# Patient Record
Sex: Female | Born: 1935 | Race: White | Hispanic: No | Smoking: Former smoker
Health system: Southern US, Community
[De-identification: ages and names within clinical notes are randomized; demographics above are authoritative.]

## PROBLEM LIST (undated history)

## (undated) DIAGNOSIS — I2510 Atherosclerotic heart disease of native coronary artery without angina pectoris: Secondary | ICD-10-CM

## (undated) DIAGNOSIS — M479 Spondylosis, unspecified: Secondary | ICD-10-CM

## (undated) DIAGNOSIS — E785 Hyperlipidemia, unspecified: Secondary | ICD-10-CM

## (undated) DIAGNOSIS — I5042 Chronic combined systolic (congestive) and diastolic (congestive) heart failure: Secondary | ICD-10-CM

## (undated) DIAGNOSIS — G629 Polyneuropathy, unspecified: Secondary | ICD-10-CM

## (undated) DIAGNOSIS — M47815 Spondylosis without myelopathy or radiculopathy, thoracolumbar region: Secondary | ICD-10-CM

## (undated) DIAGNOSIS — K219 Gastro-esophageal reflux disease without esophagitis: Secondary | ICD-10-CM

## (undated) DIAGNOSIS — I10 Essential (primary) hypertension: Secondary | ICD-10-CM

## (undated) DIAGNOSIS — I4891 Unspecified atrial fibrillation: Secondary | ICD-10-CM

## (undated) HISTORY — DX: Polyneuropathy, unspecified: G62.9

## (undated) HISTORY — PX: BACK SURGERY: SHX140

## (undated) HISTORY — PX: ABDOMINAL HYSTERECTOMY: SHX81

## (undated) HISTORY — DX: Atherosclerotic heart disease of native coronary artery without angina pectoris: I25.10

## (undated) HISTORY — DX: Essential (primary) hypertension: I10

## (undated) HISTORY — PX: CORONARY ANGIOPLASTY: SHX604

## (undated) HISTORY — DX: Hyperlipidemia, unspecified: E78.5

## (undated) HISTORY — PX: TONSILLECTOMY: SUR1361

---

## 1958-10-31 HISTORY — PX: CERVICAL DISC SURGERY: SHX588

## 1997-11-26 ENCOUNTER — Other Ambulatory Visit: Admission: RE | Admit: 1997-11-26 | Discharge: 1997-11-26 | Payer: Self-pay | Admitting: Internal Medicine

## 1998-11-28 ENCOUNTER — Other Ambulatory Visit: Admission: RE | Admit: 1998-11-28 | Discharge: 1998-11-28 | Payer: Self-pay | Admitting: Internal Medicine

## 1999-12-07 ENCOUNTER — Other Ambulatory Visit: Admission: RE | Admit: 1999-12-07 | Discharge: 1999-12-07 | Payer: Self-pay | Admitting: Internal Medicine

## 1999-12-30 ENCOUNTER — Encounter: Admission: RE | Admit: 1999-12-30 | Discharge: 1999-12-30 | Payer: Self-pay | Admitting: Internal Medicine

## 1999-12-30 ENCOUNTER — Encounter: Payer: Self-pay | Admitting: Internal Medicine

## 2000-01-12 ENCOUNTER — Encounter: Payer: Self-pay | Admitting: Internal Medicine

## 2000-01-12 ENCOUNTER — Encounter: Admission: RE | Admit: 2000-01-12 | Discharge: 2000-01-12 | Payer: Self-pay | Admitting: Internal Medicine

## 2001-01-12 ENCOUNTER — Encounter: Admission: RE | Admit: 2001-01-12 | Discharge: 2001-01-12 | Payer: Self-pay | Admitting: Internal Medicine

## 2001-01-12 ENCOUNTER — Encounter: Payer: Self-pay | Admitting: Internal Medicine

## 2002-01-16 ENCOUNTER — Encounter: Admission: RE | Admit: 2002-01-16 | Discharge: 2002-01-16 | Payer: Self-pay | Admitting: Internal Medicine

## 2002-01-16 ENCOUNTER — Encounter: Payer: Self-pay | Admitting: Internal Medicine

## 2002-03-21 ENCOUNTER — Encounter (INDEPENDENT_AMBULATORY_CARE_PROVIDER_SITE_OTHER): Payer: Self-pay | Admitting: *Deleted

## 2002-03-21 ENCOUNTER — Ambulatory Visit (HOSPITAL_COMMUNITY): Admission: RE | Admit: 2002-03-21 | Discharge: 2002-03-21 | Payer: Self-pay | Admitting: Gastroenterology

## 2003-02-14 ENCOUNTER — Encounter: Admission: RE | Admit: 2003-02-14 | Discharge: 2003-02-14 | Payer: Self-pay | Admitting: Internal Medicine

## 2003-03-02 DIAGNOSIS — I2510 Atherosclerotic heart disease of native coronary artery without angina pectoris: Secondary | ICD-10-CM

## 2003-03-02 HISTORY — DX: Atherosclerotic heart disease of native coronary artery without angina pectoris: I25.10

## 2003-04-12 ENCOUNTER — Inpatient Hospital Stay (HOSPITAL_BASED_OUTPATIENT_CLINIC_OR_DEPARTMENT_OTHER): Admission: RE | Admit: 2003-04-12 | Discharge: 2003-04-12 | Payer: Self-pay | Admitting: Cardiology

## 2003-04-12 HISTORY — PX: CARDIAC CATHETERIZATION: SHX172

## 2003-04-18 ENCOUNTER — Inpatient Hospital Stay (HOSPITAL_COMMUNITY)
Admission: RE | Admit: 2003-04-18 | Discharge: 2003-04-23 | Payer: Self-pay | Admitting: Thoracic Surgery (Cardiothoracic Vascular Surgery)

## 2003-04-18 HISTORY — PX: CORONARY ARTERY BYPASS GRAFT: SHX141

## 2003-05-27 ENCOUNTER — Encounter (HOSPITAL_COMMUNITY): Admission: RE | Admit: 2003-05-27 | Discharge: 2003-08-25 | Payer: Self-pay | Admitting: Cardiology

## 2003-09-09 ENCOUNTER — Encounter (HOSPITAL_COMMUNITY): Admission: RE | Admit: 2003-09-09 | Discharge: 2003-12-08 | Payer: Self-pay | Admitting: Cardiology

## 2003-11-08 ENCOUNTER — Ambulatory Visit (HOSPITAL_BASED_OUTPATIENT_CLINIC_OR_DEPARTMENT_OTHER): Admission: RE | Admit: 2003-11-08 | Discharge: 2003-11-08 | Payer: Self-pay | Admitting: Orthopedic Surgery

## 2003-11-08 ENCOUNTER — Ambulatory Visit (HOSPITAL_COMMUNITY): Admission: RE | Admit: 2003-11-08 | Discharge: 2003-11-08 | Payer: Self-pay | Admitting: Orthopedic Surgery

## 2003-12-31 ENCOUNTER — Encounter (HOSPITAL_COMMUNITY): Admission: RE | Admit: 2003-12-31 | Discharge: 2004-03-30 | Payer: Self-pay | Admitting: Cardiology

## 2004-03-06 ENCOUNTER — Encounter: Admission: RE | Admit: 2004-03-06 | Discharge: 2004-03-06 | Payer: Self-pay | Admitting: Internal Medicine

## 2004-04-01 ENCOUNTER — Encounter (HOSPITAL_COMMUNITY): Admission: RE | Admit: 2004-04-01 | Discharge: 2004-06-30 | Payer: Self-pay | Admitting: Cardiology

## 2004-07-01 ENCOUNTER — Encounter (HOSPITAL_COMMUNITY): Admission: RE | Admit: 2004-07-01 | Discharge: 2004-09-29 | Payer: Self-pay | Admitting: Cardiology

## 2004-07-21 ENCOUNTER — Encounter: Admission: RE | Admit: 2004-07-21 | Discharge: 2004-07-21 | Payer: Self-pay | Admitting: Internal Medicine

## 2005-03-30 ENCOUNTER — Encounter: Admission: RE | Admit: 2005-03-30 | Discharge: 2005-03-30 | Payer: Self-pay | Admitting: Internal Medicine

## 2006-03-31 ENCOUNTER — Encounter: Admission: RE | Admit: 2006-03-31 | Discharge: 2006-03-31 | Payer: Self-pay | Admitting: Internal Medicine

## 2007-04-11 ENCOUNTER — Encounter: Admission: RE | Admit: 2007-04-11 | Discharge: 2007-04-11 | Payer: Self-pay | Admitting: Internal Medicine

## 2007-11-28 ENCOUNTER — Encounter: Admission: RE | Admit: 2007-11-28 | Discharge: 2007-11-28 | Payer: Self-pay | Admitting: Internal Medicine

## 2007-11-29 ENCOUNTER — Encounter: Admission: RE | Admit: 2007-11-29 | Discharge: 2007-11-29 | Payer: Self-pay | Admitting: Internal Medicine

## 2010-03-21 ENCOUNTER — Encounter: Payer: Self-pay | Admitting: Internal Medicine

## 2010-07-17 NOTE — Discharge Summary (Signed)
NAMEANNALIZA, ZIA                          ACCOUNT NO.:  [id number]   MEDICAL RECORD NO.:  [id number]                   PATIENT TYPE:  INP   LOCATION:  2010                                 FACILITY:  [facility]   PHYSICIAN:  Salvatore Decent. Dorris Fetch, M.D.         DATE OF BIRTH:  [birth year]   DATE OF ADMISSION:  04/18/2003  DATE OF DISCHARGE:                                 DISCHARGE SUMMARY   DATE OF ANTICIPATED DISCHARGE:  April 23, 2003.   PRIMARY ADMITTING DIAGNOSIS:  Chest pain.   ADDITIONAL/DISCHARGE DIAGNOSES:  1. Severe two vessel coronary artery disease with left main disease.  2. Hypercholesterolemia.  3. History of peripheral neuropathy.  4. History of esophageal spasm.   PROCEDURES PERFORMED.:  1. Coronary artery bypass grafting x 4 (left internal mammary artery to the     LAD, saphenous vein graft to the first diagonal, sequential saphenous     vein graft to the first and second obtuse marginals).  2. Endoscopic vein harvest left thigh.   HISTORY:  The patient is a 77 year old white female with no prior history of  coronary artery disease.  In November 2004 she began a workout program and  started walking on the treadmill.  After about 15 minutes on the treadmill,  however, she experienced chest tightness, which was not associated with  diaphoresis or nausea.  This would resolve with rest and was also associated  with mild shortness of breath.  This continued throughout December and  January and in early February 2005 she sought attention from Dr. Jacky Kindle for  this problem.  She underwent testing, which included a stress Cardiolite,  which showed significant ST depression as well as significant lateral wall  ischemia, which was reversible.  Because of this, she was referred to Dr.  Patty Sermons and subsequently to Dr. Peter Swaziland and ultimately underwent  cardiac catheterization.  This showed left main coronary disease with a 60%  stenosis, a significant 90% proximal  LAD lesion and a proximal occlusion of  her circumflex with a small, non-dominant right coronary artery.  Left  ventricular function was normal.  There was a small amount of mitral  regurgitation noted, but this was felt to be catheter induced.  Because of  her symptoms of exertional angina as well as severe left main disease, she  was referred to Dr. Charlett Lango for consideration of surgical  revascularization.  After examination of the patient and review of her  catheterization films, it was his opinion that she would benefit from  coronary artery bypass surgery at this time.  After a complete discussion of  the risks, benefits and alternatives, the patient agreed to proceed with  surgery.  She was scheduled for an outpatient admission on Thursday,  February 17 to proceed with surgery.   HOSPITAL COURSE:  She was admitted to [facility] on April 18, 2003 and was taken to the operating room where she  underwent CABG x 4, as  described in detail above.  She tolerated the procedure well and was  transferred to the SICU in stable condition.  She was able to be extubated  shortly after surgery.  She was initially maintained on a low dose dopamine  drip.  This was weaned and discontinued by late in the evening following  surgery.  She was hemodynamically stable and doing well on postoperative day  one.  At that time her mediastinal chest tubes were removed.  Her  hemodynamic monitoring devices were also removed and she was able to be  transferred to the floor.  Overall, postoperatively she has done well.  By  postoperative day two her remaining pleural chest tubes were removed.  She  was mobilized with cardiac rehab phase I.  She was also started on a beta  blocker and statin therapy.  Her early postoperative issues have been some  mild nausea and vomiting, which occurred on postoperative day three.  She  was complaining of constipation and was treated with laxative therapy  as  well as minimal use of narcotics.  She was able to have a bowel movement and  her symptoms improved considerably.  Since that time she has been taking  p.o. without difficulty.  Also, she developed a leukocytosis up to 21,000 on  postoperative day four.  On exam she had no signs or symptoms of infection.  She has been afebrile and other vital signs have been stable.  It is felt  that this can be rechecked on postoperative day five and a CBC has been  ordered as well as PA and lateral chest x-rays.  Her other lab values showed  a hemoglobin of 11.0, hematocrit 32, platelets 260, potassium 4.5, BUN 10,  creatinine 1.0.  She has otherwise done well.  She has been somewhat volume  overloaded and is diuresing well with Lasix and potassium.  Again, she has  been ambulating in the halls without difficulty and has been weaned from  supplemental oxygen.  She is maintaining O2 sats of greater than 90% on room  air.  Her surgical incision sites are all healing well.  It is felt that if  she remains stable, her white blood cell count remains stable or trends  downward and she is showing no signs or symptoms of infection on  postoperative day five she will hopefully be ready for discharge home on  April 23, 2003.   DISCHARGE MEDICATIONS:  1. Enteric coated aspirin 325 mg q. day.  2. Toprol XL 25 mg q. day.  3. Lipitor 10 mg q.h.s.  4. Lasix 40 mg q. day times one week.  5. K-Dur 20 mEq q. day times one week.  6. Ultram 50 to 100 mg q. 4 hours p.r.n. for pain.   DISCHARGE INSTRUCTIONS:  She is to refrain from driving, heavy lifting or  strenuous activity.  She may continue daily ambulation and use of her  incentive spirometer.  She will continue a low fat, low sodium diet.  She is  asked to shower daily and clean her incisions with soap and water.   DISCHARGE FOLLOW-UP:  She was asked to make an appointment to see Dr. Swaziland in two weeks and to have a chest x-ray at that visit.  She will  then follow-  up with Dr. Dorris Fetch on Thursday, May 16, 2003 at 2:00 p.m.  She is  asked to bring her chest x-ray to this appointment for him to review.  She  will call our office in the interim if she experiences any problems or has  questions.      Coral Ceo, P.A.                        Salvatore Decent Dorris Fetch, M.D.    GC/MEDQ  D:  04/22/2003  T:  04/22/2003  Job:  [id number]   cc:   Geoffry Paradise, M.D.  [address]  [place]  [place] [address]  Fax: [phone]   Peter M. Swaziland, M.D.  [address]  [place], [place] [address]  Fax: [phone]

## 2010-07-17 NOTE — Op Note (Signed)
Cheryl Zamora, Cheryl Zamora                          ACCOUNT NO.:  [id number]   MEDICAL RECORD NO.:  [id number]                   PATIENT TYPE:  INP   LOCATION:  2312                                 FACILITY:  [facility]   PHYSICIAN:  Salvatore Decent. Dorris Fetch, M.D.         DATE OF BIRTH:  1924-02-01   DATE OF PROCEDURE:  04/18/2003  DATE OF DISCHARGE:                                 OPERATIVE REPORT   PREOPERATIVE DIAGNOSES:  Left main and severe two-vessel coronary disease.   POSTOPERATIVE DIAGNOSES:  Left main and severe two-vessel coronary disease.   OPERATION PERFORMED:  Median sternotomy, extracorporeal circulation,  coronary artery bypass grafting times four (left internal mammary artery to  left anterior descending, saphenous vein graft to first diagonal, sequential  saphenous vein graft to obtuse marginals 1 and 2), endoscopic vein harvest  to left thigh.   SURGEON:  Salvatore Decent. Dorris Fetch, M.D.   ASSISTANTEber Jones A. Eustaquio Boyden.   ANESTHESIA:  General.   FINDINGS:  Vein from the left leg, small but good quality.  Vein from right  leg explored.  Only a very small unusable vein found at the knee.  Vein not  harvested from the right leg.  Coronaries small, poor quality.  OM branches  and LAD intramyocardial.   INDICATIONS FOR PROCEDURE:  Cheryl Zamora is a 77 year old lady who recently  began an exercise program and noted while walking on a treadmill, she would  develop tightness in her chest.  This occurred on a regular basis with  approximately the same amount of exercise each time.  She notified Dr.  Jacky Kindle, who began work-up and subsequently referred her to Dr. Peter Swaziland  who performed cardiac catheterization which revealed left main disease,  tight lesion in her LAD and total occlusion of the left circumflex.  She had  a small nondominant right coronary that did not have significant disease.  Cheryl Zamora was referred for consideration of coronary artery bypass grafting.  The  indications, risks, benefits and alternative procedures were discussed  in detail.  She understood and accepted the risks of bypass surgery and  agreed to proceed.   DESCRIPTION OF PROCEDURE:  Cheryl Zamora was brought to the preop holding area on  April 18, 2003.  Lines were placed to monitor arterial, central venous  and pulmonary arterial pressure.  Intravenous antibiotics were administered.  The patient was taken to the operating room, anesthetized and intubated.  A  Foley catheter was placed.  The chest, abdomen and legs were prepped and  draped in the usual fashion.   An incision was made on the medial aspect of the right leg at the level of  the knee.  An attempt to identify the greater saphenous vein was  unsuccessful.  There was a small vein which appeared superficial.  This was  not suitable for use as a bypass graft.  This incision then was closed in  standard fashion.  Another incision was made on the medial aspect of the  left leg at the level of the knee.  There the greater saphenous vein was  identified.  It then was harvested endoscopically from the left thigh.  It  was a relatively small caliber but good quality.   A median sternotomy was performed and the left internal mammary artery was  harvested using standard technique.  Again, this was small but of good  quality.  The patient was fully heparinized prior to dividing the distal end  of the mammary artery.  There was excellent flow through the cut end of the  vessel.  The pericardium was opened.  The ascending aorta was inspected and  palpated.  There was no palpable atherosclerotic disease.  The aorta was  cannulated via concentric 2-0 Ethibond pledgeted pursestring sutures.  A  dual stage venous cannula was placed via pursestring suture on the right  atrial appendage.  Cardiopulmonary bypass was instituted and the patient was  the patient was cooled to 32 degrees Celsius.  The coronary arteries were  inspected and  anastomotic sites were chosen.  Both OM1 and OM2 were small  intramyocardial vessels.  The LAD also was intramyocardial.  The conduits  were inspected and cut to length. The foam pad was placed in the pericardium  to protect the left phrenic nerve.  A temperature probe was placed in the  myocardial septum and a cardioplegia cannula was placed in the ascending  aorta.   The aorta was crossclamped.  The left ventricle was emptied via the aortic  root vent.  Cardiac arrest then was achieved with a combination of cold  antegrade blood cardioplegia and topical iced saline.  After achieving a  complete diastolic arrest and myocardial septal temperature of 10 degrees  Celsius, the following distal anastomoses were performed.   First a reversed saphenous vein graft was placed end-to-side to the first  diagonal branch of the LAD.  This was a 1.5 mm vessel proximally, 1 mm  vessel distally.  The vein graft was anastomosed end-to-side with a running  7-0 Prolene suture.  This anastomosis and all others were probed proximally  and distally prior to tying the suture.  There was good flow through the  graft relative to the vessel size.  Cardioplegia was administered.  There  was good hemostasis.  Next, a reversed saphenous vein graft was placed  sequentially to obtuse marginals 1 and 2. These were both relatively high  anterolateral branches.  OM1 was essentially equivalent to a ramus  intermedius branch.  Both vessels were intramyocardial.  Both were small and  accepted only a 1 mm probe distally.  A 1.5 mm probe did pass proximally in  OM2.  Both were poor quality targets.  A side-to-side anastomosis was  performed from the vein graft to OM1 and end-to-side OM2.  There was good  flow through the graft.  Cardioplegia was administered.  There was good  hemostasis at the anastomoses.   Next, the left internal mammary artery was brought through a window in the pericardium.  The distal end was  spatulated.  It was anastomosed end-to-side  to the distal LAD which was a 1.5 mm intramyocardial vessel.  It did have  diffuse disease throughout.  The end-to-side anastomosis was performed with  a running 8-0 Prolene suture.  At completion of the mammary to LAD  anastomosis, the bulldog clamp was briefly removed.  Immediate and rapid  septal rewarming was noted.  The bulldog  clamp was replaced.  The mammary  pedicle was tacked to the epicardial surface of the heart with 6-0 Prolene  sutures.   Additional cardioplegia was administered down the vein grafts and the aortic  root.  The cardioplegia cannula then was removed from the ascending aorta  and the vein grafts were cut to length and the proximal vein graft  anastomoses were performed to 4.0 mm punch aortotomies with running 6-0  Prolene sutures.   At the completion of the final proximal anastomosis, the patient was placed  in Trendelenburg position.  The bulldog clamp was again removed from the  mammary artery.  Lidocaine was administered.  The aortic root was deaired.  The aortic crossclamp was removed.  The total crossclamp time was 71  minutes.  All proximal and distal anastomoses were inspected for hemostasis.  Epicardial pacing wires were placed on the right ventricle and right atrium  and when the patient had been rewarmed to a core temperature of 37 degrees  Celsius, she was weaned from cardiopulmonary bypass.  She was atrially paced  for rate and on no inotropic support at time of separation from bypass.  The  index was less than 2 L per minute per meter squared but with volume  resuscitation, her cardiac index improved and she remained hemodynamically  stable throughout the postbypass period.  The test dose of protamine was  administered and was well tolerated.  The atrial and aortic cannulae were  removed.  The remainder of the protamine was administered without incident.  The chest was irrigated with 1 L of warm normal  saline containing 1 g of  vancomycin.  Hemostasis was achieved.  The pericardium was reapproximated  over the ascending aorta and base of the heart with interrupted 3-0 silk  sutures.  It came together easily without tension.  A left pleural and two  mediastinal chest tubes were placed through separate subcostal incisions.  The sternum was closed with heavy gauge interrupted stainless steel wires.  The pectoralis fascia was closed with running #1 Vicryl suture.  The  subcutaneous tissues were closed with running 2-0 Vicryl sutures.  The skin  was closed with 3-0 Vicryl subcuticular suture.  All sponge, needle and  instrument counts were correct at the end of the procedure.  Cheryl Zamora was  taken from the operating room to the surgical intensive care unit in  critical but stable condition.                                               Salvatore Decent Dorris Fetch, M.D.    SCH/MEDQ  D:  04/18/2003  T:  04/19/2003  Job:  [id number]   cc:   Peter M. Swaziland, M.D. [address]  [place], [place] [address]  Fax: [phone]   Geoffry Paradise, M.D.  [address]  [place]  [place] [address]  Fax: [phone]

## 2010-07-17 NOTE — Op Note (Signed)
[name], Cheryl Zamora                          ACCOUNT NO.:  [id number]   MEDICAL RECORD NO.:  [id number]                   PATIENT TYPE:  AMB   LOCATION:  ENDO                                 FACILITY:  [facility]   PHYSICIAN:  Danise Edge, M.D.                DATE OF BIRTH:  11/05/1923   DATE OF PROCEDURE:  DATE OF DISCHARGE:                                 OPERATIVE REPORT   PROCEDURE:  Screening colonoscopy.   ENDOSCOPIST:  Verlin Grills, M.D.   REFERRING PHYSICIAN:  Geoffry Paradise, M.D.   INDICATIONS FOR PROCEDURE:  This patient is a 77 year old female born 1923-10-24.  She is scheduled to undergo her first screening colonoscopy with  polypectomy to prevent colon cancer.   PREMEDICATION:  Versed 4 mg, Demerol 50 mg.   ENDOSCOPE:  Olympus pediatric colonoscope.   DESCRIPTION OF PROCEDURE:  After obtaining the informed consent, the patient  was placed in the left lateral decubitus position.  I administered  intravenous Demerol and intravenous Versed to achieve conscious sedation  before the  procedure.  The patient's blood pressure, oxygen saturation, and cardiac  rhythm were monitored throughout the procedure and documented in the medical  record.   Anal inspection was normal.  Digital rectal exam was normal.  The Olympus  pediatric video colonoscope was introduced into the rectum and easily  advanced to the cecum.  Colonic preparation for the exam today was  excellent.   Rectum normal.   Sigmoid colon and descending colon:  Left colonic diverticulosis.  From the  mid sigmoid colon, a 12 mm sessile polyp was removed with the hot biopsy  forceps.  From the proximal descending colon, a 2 mm sessile polyp was  removed with the hot biopsy forceps.   Splenic flexure normal.   Transverse colon normal.   Hepatic flexure:  A 2 mm sessile polyp was removed with the hot biopsy  forceps.   Ascending colon normal.   Cecum and ileocecal valve normal.   ASSESSMENT:  1. Left colonic diverticulosis.  2. Three small polyps were removed from the colon with the hot biopsy     forceps; a 2 mm polyp was present in the hepatic flexure.  A 2 mm polyp     was present in the descending colon, and a 2 mm polyp was present in the     sigmoid colon.   RECOMMENDATIONS:  If polyps return neoplastic, the patient should undergo a  repeat colonoscopy in 3-5 years.                                               Danise Edge, M.D.    MJ/MEDQ  D:  03/21/2002  T:  03/21/2002  Job:  [id number]   cc:  Geoffry Paradise, M.D.  [address]  [place]  [place] [address]  Fax: [phone]

## 2010-07-17 NOTE — Cardiovascular Report (Signed)
NAMESOL, ODOR                          ACCOUNT NO.:  [id number]   MEDICAL RECORD NO.:  [id number]                   PATIENT TYPE:  OIB   LOCATION:  6501                                 FACILITY:  [facility]   PHYSICIAN:  Peter M. Swaziland, M.D.               DATE OF BIRTH:  [birth year]   DATE OF PROCEDURE:  04/12/2003  DATE OF DISCHARGE:                              CARDIAC CATHETERIZATION   INDICATION FOR PROCEDURE:  This is a 77 year old white female with a history  of hypercholesterolemia who presents with a 57-month history of dyspnea on  exertion.  Stress Cardiolite study was abnormal, showing evidence of lateral  wall ischemia.   PROCEDURE:  Left heart catheterization, coronary and left ventricular  angiography.   ACCESS:  Via the right femoral artery using standard Seldinger technique.   EQUIPMENT:  4-French 4-cm right and left Judkins catheters, 4-French pigtail  catheter, 4-French arterial sheath.   MEDICATIONS:  Local anesthesia 1% Xylocaine, Versed 1 mg IV.   CONTRAST:  120 mL of Omnipaque.   HEMODYNAMIC DATA:  1. Aortic pressure was 147/57 with a mean of 92 mmHg.  2. Left ventricular pressure was 144 with an EDP of 23 mmHg.   ANGIOGRAPHIC DATA:  The left coronary artery arises and distributes  normally.  The left main coronary artery has a 60% stenosis with severe  ulceration.   The left anterior descending artery is a large vessel which has a complex  90% stenosis proximally at the takeoff of the first septal perforator.  The  first diagonal has mild disease up to 30%.  There is also diffuse 30%  narrowing in the mid LAD.   The left circumflex coronary has a 95% stenosis at its takeoff and then is  totally occluded proximally.  It does fill late by left-to-left collaterals.   The right coronary is a small nondominant vessel and has no significant  disease.   Left ventricular angiography demonstrates normal left ventricular size and  contractility with normal  systolic function.  Ejection fraction is estimated  at 60%.  There is no mitral regurgitation or prolapse.  The aortic valve  appears normal.   FINAL INTERPRETATION:  1. Severe 2-vessel and left main obstructive atherosclerotic coronary artery     disease.  2. Normal left ventricular function.   PLAN:  The patient will need to be referred for coronary artery bypass  surgery.                                               Peter M. Swaziland, M.D.    PMJ/MEDQ  D:  04/12/2003  T:  04/12/2003  Job:  [id number]   cc:   Geoffry Paradise, M.D.  [address]  [place]  [place] [address]  Fax: [phone]  Cassell Clement, M.D.  [address]  [place]  [place] [address]  Fax: [phone]   CVTS

## 2011-07-08 ENCOUNTER — Encounter: Payer: Self-pay | Admitting: *Deleted

## 2011-07-08 DIAGNOSIS — G629 Polyneuropathy, unspecified: Secondary | ICD-10-CM | POA: Insufficient documentation

## 2011-11-09 ENCOUNTER — Encounter: Payer: Self-pay | Admitting: Cardiology

## 2011-11-10 ENCOUNTER — Encounter: Payer: Self-pay | Admitting: Cardiology

## 2012-08-10 ENCOUNTER — Other Ambulatory Visit: Payer: Self-pay | Admitting: Internal Medicine

## 2012-08-10 DIAGNOSIS — Z1231 Encounter for screening mammogram for malignant neoplasm of breast: Secondary | ICD-10-CM

## 2012-09-12 ENCOUNTER — Ambulatory Visit: Payer: Self-pay

## 2012-09-27 ENCOUNTER — Ambulatory Visit
Admission: RE | Admit: 2012-09-27 | Discharge: 2012-09-27 | Disposition: A | Discharge status: Home / Self Care | Payer: Medicare Other | Source: Ambulatory Visit | Attending: Internal Medicine | Admitting: Internal Medicine

## 2012-09-27 DIAGNOSIS — Z1231 Encounter for screening mammogram for malignant neoplasm of breast: Secondary | ICD-10-CM

## 2012-12-12 ENCOUNTER — Emergency Department (HOSPITAL_COMMUNITY): Payer: Medicare Other

## 2012-12-12 ENCOUNTER — Inpatient Hospital Stay (HOSPITAL_COMMUNITY)
Admission: EM | Admit: 2012-12-12 | Discharge: 2012-12-19 | DRG: 308 | Disposition: A | Discharge status: Home Health | Payer: Medicare Other | Attending: Cardiology | Admitting: Cardiology

## 2012-12-12 ENCOUNTER — Encounter (HOSPITAL_COMMUNITY): Payer: Self-pay | Admitting: Emergency Medicine

## 2012-12-12 DIAGNOSIS — R5381 Other malaise: Secondary | ICD-10-CM | POA: Diagnosis present

## 2012-12-12 DIAGNOSIS — I2510 Atherosclerotic heart disease of native coronary artery without angina pectoris: Secondary | ICD-10-CM | POA: Diagnosis present

## 2012-12-12 DIAGNOSIS — I5032 Chronic diastolic (congestive) heart failure: Secondary | ICD-10-CM | POA: Diagnosis present

## 2012-12-12 DIAGNOSIS — I059 Rheumatic mitral valve disease, unspecified: Secondary | ICD-10-CM

## 2012-12-12 DIAGNOSIS — I509 Heart failure, unspecified: Secondary | ICD-10-CM | POA: Diagnosis present

## 2012-12-12 DIAGNOSIS — G609 Hereditary and idiopathic neuropathy, unspecified: Secondary | ICD-10-CM | POA: Diagnosis present

## 2012-12-12 DIAGNOSIS — I4891 Unspecified atrial fibrillation: Principal | ICD-10-CM | POA: Diagnosis present

## 2012-12-12 DIAGNOSIS — I480 Paroxysmal atrial fibrillation: Secondary | ICD-10-CM | POA: Diagnosis present

## 2012-12-12 DIAGNOSIS — I25810 Atherosclerosis of coronary artery bypass graft(s) without angina pectoris: Secondary | ICD-10-CM

## 2012-12-12 DIAGNOSIS — E785 Hyperlipidemia, unspecified: Secondary | ICD-10-CM | POA: Diagnosis present

## 2012-12-12 DIAGNOSIS — Z800 Family history of malignant neoplasm of digestive organs: Secondary | ICD-10-CM

## 2012-12-12 DIAGNOSIS — F0390 Unspecified dementia without behavioral disturbance: Secondary | ICD-10-CM | POA: Diagnosis present

## 2012-12-12 DIAGNOSIS — Z87891 Personal history of nicotine dependence: Secondary | ICD-10-CM

## 2012-12-12 DIAGNOSIS — Z8679 Personal history of other diseases of the circulatory system: Secondary | ICD-10-CM

## 2012-12-12 DIAGNOSIS — Z808 Family history of malignant neoplasm of other organs or systems: Secondary | ICD-10-CM

## 2012-12-12 DIAGNOSIS — Z8249 Family history of ischemic heart disease and other diseases of the circulatory system: Secondary | ICD-10-CM

## 2012-12-12 DIAGNOSIS — E876 Hypokalemia: Secondary | ICD-10-CM | POA: Diagnosis not present

## 2012-12-12 DIAGNOSIS — I5031 Acute diastolic (congestive) heart failure: Secondary | ICD-10-CM | POA: Diagnosis present

## 2012-12-12 DIAGNOSIS — I10 Essential (primary) hypertension: Secondary | ICD-10-CM | POA: Diagnosis present

## 2012-12-12 DIAGNOSIS — I4892 Unspecified atrial flutter: Secondary | ICD-10-CM | POA: Diagnosis present

## 2012-12-12 DIAGNOSIS — K219 Gastro-esophageal reflux disease without esophagitis: Secondary | ICD-10-CM | POA: Diagnosis present

## 2012-12-12 DIAGNOSIS — Z951 Presence of aortocoronary bypass graft: Secondary | ICD-10-CM

## 2012-12-12 DIAGNOSIS — I5033 Acute on chronic diastolic (congestive) heart failure: Secondary | ICD-10-CM

## 2012-12-12 LAB — CBC
HCT: 43.3 % (ref 36.0–46.0)
Hemoglobin: 14 g/dL (ref 12.0–15.0)
MCHC: 32.3 g/dL (ref 30.0–36.0)
MCV: 87.7 fL (ref 78.0–100.0)
Platelets: 309 10*3/uL (ref 150–400)
RBC: 4.94 MIL/uL (ref 3.87–5.11)
RDW: 14.4 % (ref 11.5–15.5)

## 2012-12-12 LAB — MAGNESIUM: Magnesium: 1.8 mg/dL (ref 1.5–2.5)

## 2012-12-12 LAB — COMPREHENSIVE METABOLIC PANEL
AST: 29 U/L (ref 0–37)
Albumin: 4 g/dL (ref 3.5–5.2)
Alkaline Phosphatase: 59 U/L (ref 39–117)
BUN: 13 mg/dL (ref 6–23)
CO2: 17 mEq/L — ABNORMAL LOW (ref 19–32)
Chloride: 98 mEq/L (ref 96–112)
Creatinine, Ser: 1.12 mg/dL — ABNORMAL HIGH (ref 0.50–1.10)
GFR calc Af Amer: 50 mL/min — ABNORMAL LOW (ref >90)
GFR calc non Af Amer: 43 mL/min — ABNORMAL LOW (ref >90)
Potassium: 4.1 mEq/L (ref 3.5–5.1)
Total Bilirubin: 0.8 mg/dL (ref 0.3–1.2)
Total Protein: 7.3 g/dL (ref 6.0–8.3)

## 2012-12-12 LAB — POCT I-STAT TROPONIN I

## 2012-12-12 LAB — MRSA PCR SCREENING: MRSA by PCR: NEGATIVE

## 2012-12-12 LAB — TROPONIN I: Troponin I: <0.3 ng/mL (ref <0.30)

## 2012-12-12 LAB — PRO B NATRIURETIC PEPTIDE: Pro B Natriuretic peptide (BNP): 2854 pg/mL — ABNORMAL HIGH (ref 0–450)

## 2012-12-12 MED ORDER — ALPRAZOLAM 0.25 MG PO TABS: 0.2500 mg | ORAL_TABLET | Freq: Two times a day (BID) | ORAL | Status: DC | PRN

## 2012-12-12 MED ORDER — HEPARIN (PORCINE) IN NACL 100-0.45 UNIT/ML-% IJ SOLN
700.0000 [IU]/h | INTRAMUSCULAR | Status: DC
  Administered 2012-12-12: 850 [IU]/h via INTRAVENOUS
  Administered 2012-12-13 – 2012-12-14 (×2): 1050 [IU]/h via INTRAVENOUS
  Administered 2012-12-16 – 2012-12-17 (×2): 700 [IU]/h via INTRAVENOUS
  Filled 2012-12-12 (×7): qty 250

## 2012-12-12 MED ORDER — FUROSEMIDE 10 MG/ML IJ SOLN
40.0000 mg | Freq: Four times a day (QID) | INTRAMUSCULAR | Status: DC
  Administered 2012-12-12 – 2012-12-15 (×11): 40 mg via INTRAVENOUS
  Filled 2012-12-12 (×14): qty 4

## 2012-12-12 MED ORDER — SODIUM CHLORIDE 0.9 % IJ SOLN
3.0000 mL | INTRAMUSCULAR | Status: DC | PRN
  Administered 2012-12-13: 3 mL via INTRAVENOUS

## 2012-12-12 MED ORDER — DILTIAZEM LOAD VIA INFUSION
20.0000 mg | Freq: Once | INTRAVENOUS | Status: AC
  Administered 2012-12-12: 20 mg via INTRAVENOUS
  Filled 2012-12-12: qty 20

## 2012-12-12 MED ORDER — SODIUM CHLORIDE 0.9 % IJ SOLN
3.0000 mL | Freq: Two times a day (BID) | INTRAMUSCULAR | Status: DC
  Administered 2012-12-13 – 2012-12-19 (×7): 3 mL via INTRAVENOUS

## 2012-12-12 MED ORDER — HEPARIN BOLUS VIA INFUSION
3500.0000 [IU] | Freq: Once | INTRAVENOUS | Status: AC
  Administered 2012-12-12: 3500 [IU] via INTRAVENOUS
  Filled 2012-12-12: qty 3500

## 2012-12-12 MED ORDER — NITROGLYCERIN 0.4 MG SL SUBL: 0.4000 mg | SUBLINGUAL_TABLET | SUBLINGUAL | Status: DC | PRN

## 2012-12-12 MED ORDER — SODIUM CHLORIDE 0.9 % IV SOLN
250.0000 mL | INTRAVENOUS | Status: DC | PRN
  Administered 2012-12-12: 250 mL via INTRAVENOUS

## 2012-12-12 MED ORDER — ACETAMINOPHEN 325 MG PO TABS: 650.0000 mg | ORAL_TABLET | ORAL | Status: DC | PRN

## 2012-12-12 MED ORDER — ZOLPIDEM TARTRATE 5 MG PO TABS: 5.0000 mg | ORAL_TABLET | Freq: Every evening | ORAL | Status: DC | PRN

## 2012-12-12 MED ORDER — DILTIAZEM HCL 100 MG IV SOLR
5.0000 mg/h | INTRAVENOUS | Status: DC
  Administered 2012-12-12: 15 mg/h via INTRAVENOUS
  Administered 2012-12-12: 5 mg/h via INTRAVENOUS
  Administered 2012-12-13 (×2): 15 mg/h via INTRAVENOUS
  Filled 2012-12-12 (×6): qty 100

## 2012-12-12 MED ORDER — ASPIRIN EC 81 MG PO TBEC
81.0000 mg | DELAYED_RELEASE_TABLET | Freq: Every day | ORAL | Status: DC
  Administered 2012-12-12 – 2012-12-18 (×7): 81 mg via ORAL
  Filled 2012-12-12 (×9): qty 1

## 2012-12-12 MED ORDER — ONDANSETRON HCL 4 MG/2ML IJ SOLN: 4.0000 mg | Freq: Four times a day (QID) | INTRAMUSCULAR | Status: DC | PRN

## 2012-12-12 MED ORDER — METOPROLOL TARTRATE 1 MG/ML IV SOLN: 5.0000 mg | INTRAVENOUS | Status: DC | PRN

## 2012-12-12 MED ORDER — SODIUM CHLORIDE 0.9 % IV SOLN: INTRAVENOUS | Status: DC

## 2012-12-12 NOTE — ED Notes (Signed)
Dr. Jeraldine Loots at bedside with patient.

## 2012-12-12 NOTE — ED Notes (Signed)
Bedside echo in progress

## 2012-12-12 NOTE — H&P (Signed)
Cardiology History and Physical   Patient ID: Cheryl Zamora MRN: [id number], DOB/AGE: [birth year] 77 y.o. Date of Encounter: 12/12/2012  Primary Physician: Jacky Kindle Primary Cardiologist: Garth Bigness  Chief Complaint:  Rapid atrial fib  HPI: Cheryl Zamora is a 79 y.o. female with a history of CAD, no hx arrhythmia. She was noted to have an elevated HR on home BP machine yest, saw Dr. Timothy Lasso today and was found to be in rapid atrial fibrillation and heart failure. She was sent to the ER for admission.   Cheryl Zamora has no idea her heart rate is irregular or rapid. She denies presyncope or syncope. She has no dizziness, palpitations, chest pain or SOB at rest.   She has noticed gradually increasing LE edema, increased abdominal size, and possibly some DOE, but denies PND or orthopnea. She does not know how much weight she has gained, but states she was losing weight prior to this event. She was up 6 lbs in Dr. Ferd Hibbs office today.   She has not had any chest pain. She is active around the house without difficulty. She was previously doing well, off all meds except the Namenda and ASA 81 mg. She restarted the Lasix 4 days ago, after he son got the on-call MD to call some in. She has not really noticed an improvement, but has not weighed herself.   Past Medical History  Diagnosis Date  . Hyperlipidemia   . Hypertension   . Coronary artery disease 2005  . History of diastolic dysfunction     well compensated  . Back problem   . Neuropathy     Surgical History:  Past Surgical History  Procedure Laterality Date  . Cardiac catheterization  2005    LMain 60, LAD 90, D1 30, CFX 95->T, RCA OK  . Coronary artery bypass graft  2005    x 4; LIMA-LAD, SVG-D1, SVG-OM1-OM2  . Other surgical history      hysterectomy  . Other surgical history      tonsilectomy  . Back surgery      I have reviewed the patient's current medications. Medication Sig  alendronate (FOSAMAX) 70 MG tablet Take 70 mg by mouth  every 7 (seven) days. Take with a full glass of water on an empty stomach.  aspirin 325 MG tablet Take 325 mg by mouth daily.  celecoxib (CELEBREX) 200 MG capsule Take 200 mg by mouth daily.  Cholecalciferol (VITAMIN D PO) Take by mouth. Taking 3 times a week  furosemide (LASIX) 40 MG tablet Take 40 mg by mouth daily.  metoprolol succinate (TOPROL-XL) 50 MG 24 hr tablet Take 50 mg by mouth daily. Take with or immediately following a meal.  telmisartan (MICARDIS) 80 MG tablet Take 80 mg by mouth daily.   Scheduled Meds:  Continuous Infusions: . diltiazem (CARDIZEM) infusion 15 mg/hr (12/12/12 1509)   PRN Meds:.  Allergies: No Known Allergies  History   Social History  . Marital Status: Single    Spouse Name: N/A    Number of Children: N/A  . Years of Education: N/A   Occupational History  . Retired    Social History Main Topics  . Smoking status: Former Smoker    Quit date: 03/02/2003  . Smokeless tobacco: Not on file  . Alcohol Use:   . Drug Use:   . Sexual Activity:    Other Topics Concern  . Not on file   Social History Narrative   Pt son helps with her care.  Family History  Problem Relation Age of Onset  . Heart attack Mother   . Heart attack Father   . Heart attack Brother   . Heart attack Brother   . Heart attack Brother   . Heart attack Brother    Family Status  Relation Status Death Age  . Mother Deceased 18  . Father Deceased 79  . Sister Deceased     colon cancer  . Brother Deceased   . Brother Deceased   . Brother Deceased   . Brother Deceased   . Brother Deceased     brain cancer    Review of Systems: She has not had a cough, fever or chills. She has no bleeding issues. She has balance problems and memory problems. One reason her meds were stopped is that she kept forgetting to take them. She has neuropathy, and is not able to really feel her feet. Full 14-point review of systems otherwise negative except as noted above.  Physical  Exam: Blood pressure 93/52, pulse 120, temperature 97.9 F (36.6 C), temperature source Oral, resp. rate 22, height 5\' 4"  (1.626 m), weight 157 lb (71.215 kg), SpO2 97.00%. General: Well developed, well nourished, elderly  female in no acute distress. Head: Normocephalic, atraumatic, sclera non-icteric, no xanthomas, nares are without discharge. Dentition: poor Neck: No carotid bruits. JVD elevated. No thyromegally Lungs: Good expansion bilaterally. without wheezes or rhonchi.  Heart: IRRegular rate and rhythm with S1 S2.  No S3 or S4.  2/6 murmur, no rubs, or gallops appreciated. Abdomen: Soft, non-tender, non-distended with normoactive bowel sounds. No hepatomegaly. No rebound/guarding. No obvious abdominal masses. Msk:  Strength and tone appear normal for age. No joint deformities or effusions, no spine or costo-vertebral angle tenderness. Extremities: No clubbing or cyanosis. 2+ bilateral LE edema.  Distal pedal pulses are 2+ in upper extrem, difficult to palpate in LE due Neuro: Alert and oriented X 3. Moves all extremities spontaneously. No focal deficits noted. Psych:  Responds to questions appropriately with a normal affect. Skin: No rashes or lesions noted. LLE has some erythema, which she says is new today.  Labs:   Lab Results  Component Value Date   WBC 10.6* 12/12/2012   HGB 14.0 12/12/2012   HCT 43.3 12/12/2012   MCV 87.7 12/12/2012   PLT 309 12/12/2012   No results found for this basename: INR,  in the last 72 hours No results found for this basename: NA, K, CL, CO2, BUN, CREATININE, CALCIUM, LABALBU, PROT, BILITOT, ALKPHOS, ALT, AST, GLUCOSE,  in the last 168 hours No results found for this basename: CKTOTAL, CKMB, TROPONINI,  in the last 72 hours  Recent Labs  12/12/12 1418  TROPIPOC 0.02   Pro B Natriuretic peptide (BNP)  Date/Time Value Range Status  12/12/2012  1:43 PM 2854.0* 0 - 450 pg/mL Final    Radiology/Studies: Dg Chest Port 1 View 12/12/2012    CLINICAL DATA:  New onset atrial fibrillation, slight shortness breath  EXAM: PORTABLE CHEST - 1 VIEW  COMPARISON:  Chest x-ray of 04/23/2003  FINDINGS: No active infiltrate or effusion is seen. Cardiomegaly is stable. Median sternotomy sutures are noted from prior CABG.  IMPRESSION: Stable cardiomegaly. No active lung disease.   Electronically Signed   By: Dwyane Dee M.D.   On: 12/12/2012 14:08     Cardiac Cath: 2005 ANGIOGRAPHIC DATA: The left coronary artery arises and distributes  normally. The left main coronary artery has a 60% stenosis with severe  ulceration.  The left  anterior descending artery is a large vessel which has a complex  90% stenosis proximally at the takeoff of the first septal perforator. The  first diagonal has mild disease up to 30%. There is also diffuse 30%  narrowing in the mid LAD.  The left circumflex coronary has a 95% stenosis at its takeoff and then is totally occluded proximally. It does fill late by left-to-left collaterals.  The right coronary is a small nondominant vessel and has no significant  disease.  Left ventricular angiography demonstrates normal left ventricular size and contractility with normal systolic function. Ejection fraction is estimated  at 60%. There is no mitral regurgitation or prolapse. The aortic valve  appears normal.  FINAL INTERPRETATION:  1. Severe 2-vessel and left main obstructive atherosclerotic coronary artery disease.  2. Normal left ventricular function.  PLAN: The patient will need to be referred for coronary artery bypass  surgery.  Echo: 06/02/2007 Mild conc LVH, nl EF, + diast dysf, mild MR/TR, mild AS  ECG: 12-Dec-2012 13:15 Atrial fibrillation with rapid ventricular response Low voltage QRS Nonspecific ST and T wave abnormality Vent. rate 171 BPM PR interval * ms QRS duration 78 ms QT/QTc 276/465 ms P-R-T axes * 76 225  ASSESSMENT AND PLAN:  Principal Problem:    1. Acute on chronic diastolic CHF  (congestive heart failure), NYHA class 3 - will need diuresis, MD advise on giving Lasix 40 mg IV x bid for 48 hours or so, follow labs and daily weights. Believe she is up about 10 lbs.  Active Problems:  2. Atrial fibrillation with rapid ventricular response - may have sparked the CHF, unknown duration. Continue the Cardizem IV for now, may want to change to BB w/ CHF. TSH ordered, f/u on results. Rate is still elevated despite Cardizem at 15 mg/hr, consider TEE/DCCV.    3.Anticoagulation - heparin->coumadin vs Eliquis, MD advise.    Hypertension - OK on current meds.   Melida Quitter, PA-C 12/12/2012 4:28 PM Beeper [phone]   The patient was seen, examined and discussed with Theodore Demark, PA-C and I agree with the above.  In summary, this is a 79 year old female with known CAD, s/p CABG in 2005 who presented with acute on chronic CHF, NYHA class III symptoms (significant SOB with any activity, severe lower extremity swelling, increased abdominal girth) and was found to be in atrial fibrillation with RVR. On her last echocardiogram from 2009 she had stage II diastolic dysfunction with preserved LV function and mildly enlarged left atrium. She was started on Cardizem drip and heparin drip. The plan is to rate control, diurese with iv Lasix overnight and schedule for a TEE/cardioversion in the am.  We will follow her echo, in case of LV dysfunction we will switch to esmolol drip.  Tobias Alexander, H 12/12/2012

## 2012-12-12 NOTE — ED Notes (Signed)
Pt belongings at bedside. Pt jewelery given to nephew

## 2012-12-12 NOTE — ED Provider Notes (Signed)
CSN: [id number]     Arrival date & time 12/12/12  1309 History   First MD Initiated Contact with Patient 12/12/12 1333     Chief Complaint  Patient presents with  . Tachycardia  . Shortness of Breath   (Consider location/radiation/quality/duration/timing/severity/associated sxs/prior Treatment) HPI Patient presents with new symptoms that began one week ago insidiously. Since onset she has had progressive shortness of breath, increasing bilateral symmetric lower extremity edema. Symptoms have progressed in spite of taking Lasix. Patient denies concurrent pain, lightheadedness, syncope, fever, chills, cough. Patient was at her physician's today, was referred here for further evaluation and management. Her physician's notes, patient has prior history of CHF, anti-dysrhythmia in their facility. Past Medical History  Diagnosis Date  . Hyperlipidemia   . Hypertension   . Coronary artery disease 2005  . History of diastolic dysfunction     well compensated  . Back problem   . Neuropathy    Past Surgical History  Procedure Laterality Date  . Cardiac catheterization  2005    LMain 60, LAD 90, D1 30, CFX 95->T, RCA OK  . Coronary artery bypass graft  2005    x 4; LIMA-LAD, SVG-D1, SVG-OM1-OM2  . Other surgical history      hysterectomy  . Other surgical history      tonsilectomy  . Back surgery     Family History  Problem Relation Age of Onset  . Heart attack Mother   . Heart attack Father   . Heart attack Brother   . Heart attack Brother   . Heart attack Brother   . Heart attack Brother    History  Substance Use Topics  . Smoking status: Former Smoker    Quit date: 03/02/2003  . Smokeless tobacco: Not on file  . Alcohol Use:    OB History   Grav Para Term Preterm Abortions TAB SAB Ect Mult Living                 Review of Systems  Constitutional:       Per HPI, otherwise negative  HENT:       Per HPI, otherwise negative  Respiratory:       Per HPI, otherwise  negative  Cardiovascular:       Per HPI, otherwise negative  Gastrointestinal: Negative for vomiting.  Endocrine:       Negative aside from HPI  Genitourinary:       Neg aside from HPI   Musculoskeletal:       Per HPI, otherwise negative  Skin: Negative.   Neurological: Negative for syncope.    Allergies  Review of patient's allergies indicates no known allergies.  Home Medications   Current Outpatient Rx  Name  Route  Sig  Dispense  Refill  . alendronate (FOSAMAX) 70 MG tablet   Oral   Take 70 mg by mouth every 7 (seven) days. Take with a full glass of water on an empty stomach.         Marland Kitchen aspirin 325 MG tablet   Oral   Take 325 mg by mouth daily.         . celecoxib (CELEBREX) 200 MG capsule   Oral   Take 200 mg by mouth daily.         . Cholecalciferol (VITAMIN D PO)   Oral   Take by mouth. Taking 3 times a week         . furosemide (LASIX) 40 MG tablet   Oral  Take 40 mg by mouth daily.         . metoprolol succinate (TOPROL-XL) 50 MG 24 hr tablet   Oral   Take 50 mg by mouth daily. Take with or immediately following a meal.         . telmisartan (MICARDIS) 80 MG tablet   Oral   Take 80 mg by mouth daily.          BP 123/91  Pulse 99  Temp(Src) 97.9 F (36.6 C) (Oral)  Resp 26  Ht 5\' 4"  (1.626 m)  Wt 157 lb (71.215 kg)  BMI 26.94 kg/m2  SpO2 96% Physical Exam  Nursing note and vitals reviewed. Constitutional: She is oriented to person, place, and time. She appears well-developed and well-nourished. No distress.  HENT:  Head: Normocephalic and atraumatic.  Eyes: Conjunctivae and EOM are normal.  Cardiovascular: Intact distal pulses.  An irregularly irregular rhythm present. Tachycardia present.   Murmur heard. Pulmonary/Chest: Effort normal and breath sounds normal. No stridor. No respiratory distress.  Abdominal: She exhibits no distension.  Musculoskeletal: She exhibits no edema.  Diffuse bilateral symmetric lower extremity  edema with no superficial changes  Neurological: She is alert and oriented to person, place, and time. No cranial nerve deficit.  Skin: Skin is warm and dry.  Psychiatric: She has a normal mood and affect.    ED Course  Procedures (including critical care time) Labs Review Labs Reviewed  CBC - Abnormal; Notable for the following:    WBC 10.6 (*)    All other components within normal limits  PRO B NATRIURETIC PEPTIDE - Abnormal; Notable for the following:    Pro B Natriuretic peptide (BNP) 2854.0 (*)    All other components within normal limits  MAGNESIUM  TSH   Imaging Review Dg Chest Port 1 View  12/12/2012   CLINICAL DATA:  New onset atrial fibrillation, slight shortness breath  EXAM: PORTABLE CHEST - 1 VIEW  COMPARISON:  Chest x-ray of 04/23/2003  FINDINGS: No active infiltrate or effusion is seen. Cardiomegaly is stable. Median sternotomy sutures are noted from prior CABG.  IMPRESSION: Stable cardiomegaly. No active lung disease.   Electronically Signed   By: Dwyane Dee M.D.   On: 12/12/2012 14:08    EKG Interpretation     Ventricular Rate:  171 PR Interval:    QRS Duration: 78 QT Interval:  276 QTC Calculation: 465 R Axis:   76 Text Interpretation:  Atrial fibrillation with rapid ventricular response Low voltage QRS Nonspecific ST and T wave abnormality Abnormal ECG           on the monitor, the patient has heart rhythm 160/170, irregular, tachycardic, abnormal Pulse oximetry 95% room air normal Patient's EKG had atrial fibrillation with rapid ventricular response, rate 171, abnormal   Immediately after the initial evaluation of the patient's chart, then the patient began to receive diltiazem, bolus and drip. Discussed the patient's case with our cardiology team.  3:21 PM HR 130's, BP 95/65  Labs notable for elevated BNP.   MDM   1. Atrial fibrillation with RVR    Patient presents with dyspnea, lower extremity edema.  Notably the patient has no prior  history of CHF, but only recently reinitiated Lasix therapy.  On exam the patient is awake and alert, but tachycardic, with notable lower extremity edema. Patient remained awake and alert throughout the emergency department resuscitation, with Cardizem, and heart rate diminished slightly.  However, the patient's blood pressure was labile. With the  patient's prior history of heart failure, but no prior dysrhythmia, she did require admission to the cardiology service for further evaluation and management. After discussion with cardiology colleagues, the patient will have echocardiogram, possible TEE, anticoagulation for likely conversion.   CRITICAL CARE Performed by: Gerhard Munch Total critical care time: 35 Critical care time was exclusive of separately billable procedures and treating other patients. Critical care was necessary to treat or prevent imminent or life-threatening deterioration. Critical care was time spent personally by me on the following activities: development of treatment plan with patient and/or surrogate as well as nursing, discussions with consultants, evaluation of patient's response to treatment, examination of patient, obtaining history from patient or surrogate, ordering and performing treatments and interventions, ordering and review of laboratory studies, ordering and review of radiographic studies, pulse oximetry and re-evaluation of patient's condition.     Gerhard Munch, MD 12/12/12 1536

## 2012-12-12 NOTE — ED Notes (Signed)
Pt assisted to bedside commode

## 2012-12-12 NOTE — ED Notes (Signed)
Pt sent by PCP for irregular tachycardia; pt denies hx of same; pt sts SOB and bilateral leg swelling x 1 week; pt denies pain

## 2012-12-12 NOTE — ED Notes (Signed)
Theodore Demark, PA-C at bedside with patient.

## 2012-12-12 NOTE — Progress Notes (Signed)
ANTICOAGULATION CONSULT NOTE - Initial Consult  Pharmacy Consult for heparin Indication: atrial fibrillation  No Known Allergies  Patient Measurements: Height: 5\' 4"  (162.6 cm) Weight: 157 lb (71.215 kg) IBW/kg (Calculated) : 54.7 Heparin Dosing Weight: 71kg  Vital Signs: Temp: 97.9 F (36.6 C) (10/14 1319) Temp src: Oral (10/14 1319) BP: 101/71 mmHg (10/14 1735) Pulse Rate: 134 (10/14 1735)  Labs:  Recent Labs  12/12/12 1343  HGB 14.0  HCT 43.3  PLT 309    CrCl is unknown because no creatinine reading has been taken.   Medical History: Past Medical History  Diagnosis Date  . Hyperlipidemia   . Hypertension   . Coronary artery disease 2005  . History of diastolic dysfunction     well compensated  . Back problem   . Neuropathy     Assessment: [name] an 79 y.o F who was referred to the ED by PCP for further evaluation of atrial fibrillation with RVR. To start heparin for afib.  Goal of Therapy:  Heparin level 0.3-0.7 units/ml Monitor platelets by anticoagulation protocol: Yes   Plan:  1) heparin 3500 units IV x1 bolus, then drip at 850 units/hr 2) check 8 hour heparin level  Cheryl Zamora P 12/12/2012,5:46 PM

## 2012-12-12 NOTE — H&P (Signed)
Vital Signs  Entered weight:  162  lbs., Calculated Weight: 162 lbs., ( 73.48 kg) Height: 60.5 in., ( 153.67 cm) Temperature: 97.5 deg F, Temperature site: oral Pulse rate:   176         Pulse rhythm: irregular Respirations: 20  Blood Pressure #1: 110 / 82 mm Hg    BMI: 31.12 BSA: 1.72 Wt Chg: 5 lbs since 08/10/2012  Vitals entered by: Inocente Salles, CMA on December 12, 2012 11:10 AM  Pulse Oximetry  O2 Saturation: 97 %        Risk Factors:   Smoked Tobacco Use:  Former smoker Passive smoke exposure:  no Drug use:  no HIV high-risk behavior:  no Caffeine use:  0 drinks per day Alcohol use:  yes    Type:  wine and just a little    Drinks per day:  0    Has patient --       Felt need to cut down:  no       Been annoyed by complaints:  no       Felt guilty about re-drinking:  no       Needed eye opener in the morning:  no    Counseled to quit/cut down alcohol use:  no Exercise:  no Seatbelt use:  100 % Sun Exposure:  occasionally  Previous Tobacco Use: Signed On - 08/10/2012 Smoked Tobacco Use:  former smoker     Pack-years:  1/2 pack a day       Year quit:  5 to 6 years ago Passive smoke exposure:  no Drug use:  no HIV high-risk behavior:  no Caffeine use:  0 drinks per day  Previous Alcohol Use: Signed On - 03/27/2009 Alcohol use:  yes    Type:  wine and just a little    Drinks per day:  0    Has patient --       Felt need to cut down:  no       Been annoyed by complaints:  no       Felt guilty about re-drinking:  no       Needed eye opener in the morning:  no    Counseled to quit/cut down alcohol use:  no Exercise:  no Seatbelt use:  100 % Sun Exposure:  occasionally  Colonoscopy History:    Date of Last Colonoscopy:  03/27/2009  Mammogram History:    Date of Last Mammogram:  09/27/2012  PAP Smear History:    Date of Last PAP Smear:  08/10/2012  History of Present Illness  History from: patient/son Reason for visit: See chief  complaint Chief Complaint: Patient has been having SOB and swelling below the knees on both legs for a little over 1 wk. Patient's son took her BP the other day and it was elevated and pulse was around 135-150.  History of Present Illness: 79 y/o F presents with her son, due to f/u for new onset edema and palpitations over w/e Had been off BB, Micardis and Lasix for several months, had discussed this with DR Jacky Kindle at her [facility] in June and was told to stay off since BP was ok She was only taking Namenda and ASA She just started to have new edema over Fri with palpitatiions and son was getting HR 150's at home.  Talked to Dr Waynard Edwards over w/e and he restarted Lasix 40mg  once daily, and thinks possibly potassium but she doesn't have that with her Hasn't seen much difference  with edema since taking Lasix for past 3 days  Last creat 1.1 in June [facility] labs She has tried to not add salt to diet for some time Has noticed SOB, orthopnea as well as edema but no c/o CP, some fatigue CABG several yrs  hasn't seen Cards in 3 yrs ([place])  Non smoker Walks with cane, no recent falls  Noted HR 176 c Afib RVR in office despite looking fine while sitting there. She has poor memory and poor Judgement.  I told her that we were going to send her to ED for eval and treat. I discussed sending her via EMS and son stated that he would take her. She has probably been in Afib RVR for at least a few days and despite some Sxs she is not in distress - OK for family to take her.  They are expecting to be admitted by [place].     Review of Systems  General:       Complains of fatigue.        Denies fevers, chills, headache, sweats, anorexia, malaise, sleep disturbance.   Eyes:       Denies irritation, discharge.   Ears/Nose/Throat:       Denies earache, ear discharge, nasal congestion, nosebleeds.   Cardiovascular:       Complains of palpitations, dyspnea on exertion, peripheral edema.        Denies chest pains.    Respiratory:       Complains of dyspnea.        Denies cough, excessive sputum, hemoptysis, wheezing.   Gastrointestinal:       Denies nausea, vomiting, diarrhea, constipation, abdominal pain, melena, hematochezia.   Genitourinary:       Denies incontinence, dysuria, nocturia, urinary frequency.   Musculoskeletal:       Complains of stiffness.        Denies back pain, joint pain.   Skin:       Denies rash, itching, dryness.   Neurologic:       Denies vertigo, dizziness.   Endocrine:       Denies polydipsia, polyphagia, polyuria.   Heme/Lymphatic:       Denies abnormal bruising, bleeding, enlarged lymph nodes.    Past History Past Medical History (reviewed - no changes required): GERD Ovarian Cyst HTN Memory Loss Vit. D def chf/diastolic dysfunction peripheral neuropathy  Surgical History (reviewed - no changes required): CAD/CABG Hysterectomy cervical disc dexa 2008 colon 2004 echo 2009 Social History (reviewed - no changes required): live alone  Family History Summary:     Reviewed history Last on 08/10/2012 and no changes required:12/12/2012 Son Baker Pierini.) - Has Family History of Arthritis - Entered On: 12/12/2012   Social History:    Reviewed history from 03/27/2009 and no changes required:       live alone   Physical Exam  General appearance: well nourished, well hydrated, no acute distress  Eyes  External: conjunctivae and lids normal  Ears, Nose and Throat  Nasal: mucosa, septum, and turbinates normal Pharynx: tongue normal, protrudes mid line,  posterior pharynx without erythema or exudate  Neck  Neck: supple, no masses, trachea midline  Respiratory  Respiratory effort: no intercostal retractions or use of accessory muscles Auscultation: few fine bibasilar crackles, no resp distress  Cardiovascular  Auscultation: irreg irreg, rate 176 Carotid arteries: pulses 2+, symmetric, no bruits Pedal pulses: pulses 2+, symmetric Periph. circulation: 1-2 +  bilat edema  Gastrointestinal  Abdomen: soft, non-tender, no masses, bowel sounds normal  Liver and spleen: no enlargement or nodularity  Musculoskeletal  Gait and station: cane, steady, min assistance up/down from table Digits and nails: no clubbing, cyanosis, petechiae, or nodes Head and neck: normal alignment and mobility  Skin  Inspection: no rashes, lesions  Neurologic  Sensation: intact to touch, vibration Speech: Normal  Mental Status Exam  Judgment, insight: limited Orientation: oriented to time, place, and person Memory: poor recent recall   Impression & Recommendations:  Problem # 1:  Atrial flutter c RVR HR 176 - No Distress but + edema and SOB.  She has been Off BB Assessment: New Her updated medication list for this problem includes:    Aspirin 325 Mg Tab (Aspirin) .Marland Kitchen... Take one (1) tablet by mouth daily  EKG with fib/flutter rate 176 new onset past 24 hrs We sent her to the ED to get labs, start Cardizem IV and call us for admission. Will also need Dr Elvis [facility] to see her. Will need Anticoagulation start but she is a fall risk and with her memory issues - Chronic anticoagulation may be a bad idea unless admitted to LTCF or ALF.  Will need ECHO as well.  Her CABG was done 2006 will electively admit for IV Cardizem and control of rate/rhythm She is agreeable  Problem # 2:  Edema (ICD-782.3) (ICD10-R60.9) Assessment: Deteriorated  Her updated medication list for this problem includes:    Lasix 40 Mg Tabs (Furosemide) .Marland Kitchen... 2 tabs daily  Orders: EKG 12 Leads (CPT-93000)  Fib/flutter with RVR 176 Only taking Lasix one daily since Sat when started over/w/e by Dr Waynard Edwards Will need to reeval for diuresing when rate controlled   Problem # 3:  SOB (ICD-786.05) (ICD10-R06.02) Assessment: Deteriorated  Orders: Pulse Ox (CPT-94728)97% Dyspnea and edema with new onset Fib/Flutter with RVR rate 176 Electively admitting for treatment and control Sheck ECHO  and rule out Tachy induced CM.   Problem # 4:  CHF (ICD-428.0) (ICD10-I50.9) Assessment: Deteriorated  The following medications were removed from the medication list:    Micardis 80 Mg Tabs (Telmisartan) .Marland Kitchen... 1 po qd    Toprol Xl 50 Mg Xr24h-tab (Metoprolol succinate) .Marland Kitchen... 1 tab daily  Her updated medication list for this problem includes:    Aspirin 325 Mg Tab (Aspirin) .Marland Kitchen... Take one (1) tablet by mouth daily    Lasix 40 Mg Tabs (Furosemide) .Marland Kitchen... 2 tabs daily  Orders: EKG 12 Leads (CPT-93000) Has been off ARB and BB  for 6 months On ASA daily New edema and SOB wt up  5 lbs from June  Problem # 5:  CORONARY ARTERY DISEASE (ICD-414.00) (ICD10-I25.10) Assessment: Unchanged  The following medications were removed from the medication list:    Micardis 80 Mg Tabs (Telmisartan) .Marland Kitchen... 1 po qd    Toprol Xl 50 Mg Xr24h-tab (Metoprolol succinate) .Marland Kitchen... 1 tab daily  Her updated medication list for this problem includes:    Aspirin 325 Mg Tab (Aspirin) .Marland Kitchen... Take one (1) tablet by mouth daily    Lasix 40 Mg Tabs (Furosemide) .Marland Kitchen... 2 tabs daily S/P CABG  Cards, Dr [place]   Problem # 6:  GAIT IMBALANCE (ICD-781.2) (ICD10-R26.89) Assessment: Unchanged uses Cane for ambulation, no recent falls  Problem # 7:  HYPERTENSION (ICD-401.9) (ICD10-I10) Assessment: Unchanged  The following medications were removed from the medication list:    Micardis 80 Mg Tabs (Telmisartan) .Marland Kitchen... 1 po qd    Toprol Xl 50 Mg Xr24h-tab (Metoprolol succinate) .Marland Kitchen... 1 tab daily  Her updated medication list  for this problem includes:    Lasix 40 Mg Tabs (Furosemide) .Marland Kitchen... 2 tabs daily  Orders: EKG 12 Leads (CPT-93000)   Problem # 8:  MEMORY LOSS (ICD-780.93) (ZOX09-U04.3) Assessment: Unchanged On Namenda Hasn't been taking most meds for several months this was discussed with Dr Jacky Kindle on [facility] in June Pt says he told her ok to be off everything but Namenda and ASA since she had been off for several  wks and VS was stable Will get PT/OT/CW. ? If she should be living on own anymore.  Problem # 9:  NEUROPATHY, IDIOPATHIC PERIPHERAL (ICD-356.9) (ICD10-G60.9) Assessment: Unchanged Fall risk with cane  Problem # 10:  VITAMIN D DEFICIENCY (ICD-268.9) (ICD10-E55.9) Assessment: Unchanged Off VIt D for over 5 months   Med list 1)  Gaviscon 95-358 Mg/71ml Susp (Alum hydroxide-mag carbonate) .... Take as directed 2)  Aspirin 325 Mg Tab (Aspirin) .... Take one (1) tablet by mouth daily 3)  Lasix 40 Mg Tabs (Furosemide) .... 2 tabs daily 4)  Namenda 10 Mg Tabs (Memantine hcl) .... Take 1 tablet by mouth twice daily

## 2012-12-13 ENCOUNTER — Encounter (HOSPITAL_COMMUNITY): Payer: Self-pay | Admitting: *Deleted

## 2012-12-13 ENCOUNTER — Encounter (HOSPITAL_COMMUNITY)
Admission: EM | Disposition: A | Discharge status: Home Health | Payer: Self-pay | Source: Home / Self Care | Attending: Cardiology

## 2012-12-13 ENCOUNTER — Inpatient Hospital Stay (HOSPITAL_COMMUNITY): Payer: Medicare Other | Admitting: Anesthesiology

## 2012-12-13 ENCOUNTER — Encounter (HOSPITAL_COMMUNITY): Payer: Medicare Other | Admitting: Anesthesiology

## 2012-12-13 DIAGNOSIS — I4891 Unspecified atrial fibrillation: Secondary | ICD-10-CM

## 2012-12-13 DIAGNOSIS — I5031 Acute diastolic (congestive) heart failure: Secondary | ICD-10-CM

## 2012-12-13 DIAGNOSIS — I25810 Atherosclerosis of coronary artery bypass graft(s) without angina pectoris: Secondary | ICD-10-CM | POA: Diagnosis present

## 2012-12-13 HISTORY — PX: TEE WITHOUT CARDIOVERSION: SHX5443

## 2012-12-13 HISTORY — PX: CARDIOVERSION: SHX1299

## 2012-12-13 LAB — CBC
HCT: 40.1 % (ref 36.0–46.0)
Hemoglobin: 13 g/dL (ref 12.0–15.0)
MCH: 28.3 pg (ref 26.0–34.0)
MCHC: 32.4 g/dL (ref 30.0–36.0)
MCV: 87.4 fL (ref 78.0–100.0)
Platelets: 293 10*3/uL (ref 150–400)
RBC: 4.59 MIL/uL (ref 3.87–5.11)
RDW: 14.2 % (ref 11.5–15.5)
WBC: 11 10*3/uL — ABNORMAL HIGH (ref 4.0–10.5)

## 2012-12-13 LAB — BASIC METABOLIC PANEL
CO2: 30 mEq/L (ref 19–32)
GFR calc Af Amer: 37 mL/min — ABNORMAL LOW (ref >90)
GFR calc non Af Amer: 32 mL/min — ABNORMAL LOW (ref >90)
Potassium: 3.8 mEq/L (ref 3.5–5.1)
Sodium: 136 mEq/L (ref 135–145)

## 2012-12-13 LAB — TSH: TSH: 1.581 u[IU]/mL (ref 0.350–4.500)

## 2012-12-13 LAB — PROTIME-INR
INR: 1.26 (ref 0.00–1.49)
Prothrombin Time: 15.5 seconds — ABNORMAL HIGH (ref 11.6–15.2)

## 2012-12-13 LAB — HEPARIN LEVEL (UNFRACTIONATED)
Heparin Unfractionated: 0.19 IU/mL — ABNORMAL LOW (ref 0.30–0.70)
Heparin Unfractionated: 0.48 IU/mL (ref 0.30–0.70)

## 2012-12-13 SURGERY — ECHOCARDIOGRAM, TRANSESOPHAGEAL
Anesthesia: Moderate Sedation

## 2012-12-13 SURGERY — ECHOCARDIOGRAM, TRANSESOPHAGEAL
Anesthesia: Monitor Anesthesia Care

## 2012-12-13 MED ORDER — MIDAZOLAM HCL 5 MG/ML IJ SOLN
INTRAMUSCULAR | Status: AC
  Filled 2012-12-13: qty 2

## 2012-12-13 MED ORDER — PROPOFOL 10 MG/ML IV BOLUS
INTRAVENOUS | Status: DC | PRN
  Administered 2012-12-13: 30 mg via INTRAVENOUS

## 2012-12-13 MED ORDER — SODIUM CHLORIDE 0.9 % IV SOLN
INTRAVENOUS | Status: DC | PRN
  Administered 2012-12-13: 12:00:00 via INTRAVENOUS

## 2012-12-13 MED ORDER — BUTAMBEN-TETRACAINE-BENZOCAINE 2-2-14 % EX AERO
INHALATION_SPRAY | CUTANEOUS | Status: DC | PRN
  Administered 2012-12-13: 2 via TOPICAL

## 2012-12-13 MED ORDER — WARFARIN - PHARMACIST DOSING INPATIENT
Freq: Every day | Status: DC
  Administered 2012-12-15 – 2012-12-16 (×2)

## 2012-12-13 MED ORDER — FENTANYL CITRATE 0.05 MG/ML IJ SOLN
INTRAMUSCULAR | Status: DC | PRN
  Administered 2012-12-13: 25 ug via INTRAVENOUS

## 2012-12-13 MED ORDER — FENTANYL CITRATE 0.05 MG/ML IJ SOLN
INTRAMUSCULAR | Status: AC
  Filled 2012-12-13: qty 2

## 2012-12-13 MED ORDER — WARFARIN SODIUM 5 MG PO TABS
5.0000 mg | ORAL_TABLET | Freq: Once | ORAL | Status: AC
  Administered 2012-12-13: 5 mg via ORAL
  Filled 2012-12-13: qty 1

## 2012-12-13 MED ORDER — MIDAZOLAM HCL 10 MG/2ML IJ SOLN
INTRAMUSCULAR | Status: DC | PRN
  Administered 2012-12-13 (×3): 1 mg via INTRAVENOUS

## 2012-12-13 NOTE — Progress Notes (Signed)
ANTICOAGULATION CONSULT NOTE - Follow Up consult  Pharmacy Consult for heparin Indication: atrial fibrillation  No Known Allergies  Patient Measurements: Height: 5\' 3"  (160 cm) Weight: 161 lb 2.5 oz (73.1 kg) IBW/kg (Calculated) : 52.4 Heparin Dosing Weight: 71kg  Vital Signs: Temp: 97.6 F (36.4 C) (10/15 0000) Temp src: Oral (10/15 0000) BP: 118/104 mmHg (10/15 0100) Pulse Rate: 94 (10/15 0100)  Labs:  Recent Labs  12/12/12 1343 12/12/12 1535 12/12/12 1538 12/12/12 2256 12/13/12 0145  HGB 14.0  --   --   --   --   HCT 43.3  --   --   --   --   PLT 309  --   --   --   --   HEPARINUNFRC  --   --   --   --  0.19*  CREATININE  --  1.12*  --   --   --   TROPONINI  --   --  <0.30 <0.30  --     Estimated Creatinine Clearance: 33.9 ml/min (by C-G formula based on Cr of 1.12).   Medical History: Past Medical History  Diagnosis Date  . Hyperlipidemia   . Hypertension   . Coronary artery disease 2005  . History of diastolic dysfunction     well compensated  . Back problem   . Neuropathy     Assessment: [name] an 79 y.o F who was referred to the ED by PCP for further evaluation of atrial fibrillation with RVR. To start heparin for afib.  Initial heparin level is subtherapeutic (0.19 units/ml)  Goal of Therapy:  Heparin level 0.3-0.7 units/ml Monitor platelets by anticoagulation protocol: Yes   Plan:  1) Increase heparin drip to 1050 units/hr 2) check 8 hour heparin level  Bettye Sitton Poteet 12/13/2012,3:33 AM

## 2012-12-13 NOTE — H&P (View-Only) (Signed)
 TELEMETRY: Reviewed telemetry pt in atrial fibrillation with RVR sustained rate 155-120 on IV diltiazem.: Filed Vitals:   12/13/12 0400 12/13/12 0500 12/13/12 0600 12/13/12 0816  BP: 94/66 93/39 102/69 110/68  Pulse: 84 154 124 115  Temp: 98 F (36.7 C)   98.5 F (36.9 C)  TempSrc: Oral   Oral  Resp: 24 20 22 22  Height:      Weight:  156 lb 4.9 oz (70.9 kg)    SpO2: 94% 92% 94% 96%    Intake/Output Summary (Last 24 hours) at 12/13/12 0841 Last data filed at 12/13/12 0600  Gross per 24 hour  Intake 468.98 ml  Output   1950 ml  Net -1481.02 ml    SUBJECTIVE Still SOB but much better. Good diuresis. No chest pain.  LABS: Basic Metabolic Panel:  Recent Labs  12/12/12 1343 12/12/12 1535 12/13/12 0635  NA  --  134* 136  K  --  4.1 3.8  CL  --  98 96  CO2  --  17* 30  GLUCOSE  --  134* 118*  BUN  --  13 16  CREATININE  --  1.12* 1.44*  CALCIUM  --  9.5 9.6  MG 1.8  --   --    Liver Function Tests:  Recent Labs  12/12/12 1535  AST 29  ALT 17  ALKPHOS 59  BILITOT 0.8  PROT 7.3  ALBUMIN 4.0   CBC:  Recent Labs  12/12/12 1343 12/13/12 0635  WBC 10.6* 11.0*  HGB 14.0 13.0  HCT 43.3 40.1  MCV 87.7 87.4  PLT 309 293   Cardiac Enzymes:  Recent Labs  12/12/12 1538 12/12/12 2256 12/13/12 0635  TROPONINI <0.30 <0.30 <0.30   BNP: 2854  Thyroid Function Tests:  Recent Labs  12/12/12 2052  TSH 1.581    Radiology/Studies:  Dg Chest Port 1 View  12/12/2012   CLINICAL DATA:  New onset atrial fibrillation, slight shortness breath  EXAM: PORTABLE CHEST - 1 VIEW  COMPARISON:  Chest x-ray of 04/23/2003  FINDINGS: No active infiltrate or effusion is seen. Cardiomegaly is stable. Median sternotomy sutures are noted from prior CABG.  IMPRESSION: Stable cardiomegaly. No active lung disease.   Electronically Signed   By: Paul  Barry M.D.   On: 12/12/2012 14:08   Ecg: Afib with RVR. Low voltage. Nonspecific ST-T changes.  PHYSICAL EXAM General:  Elderly, well nourished, in no acute distress. Head: Normal Neck: Negative for carotid bruits. JVD  elevated. Lungs: Clear bilaterally to auscultation without wheezes, rales, or rhonchi. Breathing is unlabored. Heart: IRRR S1 S2 without murmurs, rubs, or gallops.  Abdomen: Soft, non-tender, non-distended with normoactive bowel sounds. No hepatomegaly. No rebound/guarding. No obvious abdominal masses. Msk:  Strength and tone appears normal for age. Extremities: 1+ edema.  Distal pedal pulses are 2+ and equal bilaterally. Neuro: Alert and oriented X 3. Moves all extremities spontaneously. Psych:  Responds to questions appropriately with a normal affect.  ASSESSMENT AND PLAN: 1. Atrial fibrillation with RVR. Rate control suboptimal despite IV diltiazem. Plan TEE guided DCCV today. 2. Acute diastolic CHF. Echo pending. 3. CAD s/p CABG. Cardiac enzymes negative. 4. Anticoagulation. On IV heparin. Will need oral therapy post DCCV. Given age and CKD with GFR of 32 options are either low dose Eliquis versus coumadin. I would favor Coumadin given the above. 5. HTN.  Principal Problem:   Acute diastolic CHF (congestive heart failure), NYHA class 3 Active Problems:   Atrial fibrillation with rapid ventricular   response   Hypertension    Signed, Cyrena Kuchenbecker MD,FACC 12/13/2012 8:46 AM    

## 2012-12-13 NOTE — Progress Notes (Signed)
  Echocardiogram Echocardiogram Transesophageal has been performed.  Cheryl Zamora 12/13/2012, 12:06 PM

## 2012-12-13 NOTE — Progress Notes (Addendum)
TELEMETRY: Reviewed telemetry pt in atrial fibrillation with RVR sustained rate 155-120 on IV diltiazem.: Filed Vitals:   12/13/12 0400 12/13/12 0500 12/13/12 0600 12/13/12 0816  BP: 94/66 93/39 102/69 110/68  Pulse: 84 154 124 115  Temp: 98 F (36.7 C)   98.5 F (36.9 C)  TempSrc: Oral   Oral  Resp: 24 20 22 22   Height:      Weight:  156 lb 4.9 oz (70.9 kg)    SpO2: 94% 92% 94% 96%    Intake/Output Summary (Last 24 hours) at 12/13/12 0841 Last data filed at 12/13/12 0600  Gross per 24 hour  Intake 468.98 ml  Output   1950 ml  Net -1481.02 ml    SUBJECTIVE Still SOB but much better. Good diuresis. No chest pain.  LABS: Basic Metabolic Panel:  Recent Labs  42/59/56 1343 12/12/12 1535 12/13/12 0635  NA  --  134* 136  K  --  4.1 3.8  CL  --  98 96  CO2  --  17* 30  GLUCOSE  --  134* 118*  BUN  --  13 16  CREATININE  --  1.12* 1.44*  CALCIUM  --  9.5 9.6  MG 1.8  --   --    Liver Function Tests:  Recent Labs  12/12/12 1535  AST 29  ALT 17  ALKPHOS 59  BILITOT 0.8  PROT 7.3  ALBUMIN 4.0   CBC:  Recent Labs  12/12/12 1343 12/13/12 0635  WBC 10.6* 11.0*  HGB 14.0 13.0  HCT 43.3 40.1  MCV 87.7 87.4  PLT 309 293   Cardiac Enzymes:  Recent Labs  12/12/12 1538 12/12/12 2256 12/13/12 0635  TROPONINI <0.30 <0.30 <0.30   BNP: 2854  Thyroid Function Tests:  Recent Labs  12/12/12 2052  TSH 1.581    Radiology/Studies:  Dg Chest Port 1 View  12/12/2012   CLINICAL DATA:  New onset atrial fibrillation, slight shortness breath  EXAM: PORTABLE CHEST - 1 VIEW  COMPARISON:  Chest x-ray of 04/23/2003  FINDINGS: No active infiltrate or effusion is seen. Cardiomegaly is stable. Median sternotomy sutures are noted from prior CABG.  IMPRESSION: Stable cardiomegaly. No active lung disease.   Electronically Signed   By: Dwyane Dee M.D.   On: 12/12/2012 14:08   Ecg: Afib with RVR. Low voltage. Nonspecific ST-T changes.  PHYSICAL EXAM General:  Elderly, well nourished, in no acute distress. Head: Normal Neck: Negative for carotid bruits. JVD  elevated. Lungs: Clear bilaterally to auscultation without wheezes, rales, or rhonchi. Breathing is unlabored. Heart: IRRR S1 S2 without murmurs, rubs, or gallops.  Abdomen: Soft, non-tender, non-distended with normoactive bowel sounds. No hepatomegaly. No rebound/guarding. No obvious abdominal masses. Msk:  Strength and tone appears normal for age. Extremities: 1+ edema.  Distal pedal pulses are 2+ and equal bilaterally. Neuro: Alert and oriented X 3. Moves all extremities spontaneously. Psych:  Responds to questions appropriately with a normal affect.  ASSESSMENT AND PLAN: 1. Atrial fibrillation with RVR. Rate control suboptimal despite IV diltiazem. Plan TEE guided DCCV today. 2. Acute diastolic CHF. Echo pending. 3. CAD s/p CABG. Cardiac enzymes negative. 4. Anticoagulation. On IV heparin. Will need oral therapy post DCCV. Given age and CKD with GFR of 32 options are either low dose Eliquis versus coumadin. I would favor Coumadin given the above. 5. HTN.  Principal Problem:   Acute diastolic CHF (congestive heart failure), NYHA class 3 Active Problems:   Atrial fibrillation with rapid ventricular  response   Hypertension    Signed, Peter [place] MD,FACC 12/13/2012 8:46 AM

## 2012-12-13 NOTE — Interval H&P Note (Signed)
History and Physical Interval Note:  12/13/2012 11:39 AM  Cheryl Zamora  has presented today for surgery, with the diagnosis of a fib   The various methods of treatment have been discussed with the patient and family. After consideration of risks, benefits and other options for treatment, the patient has consented to  Procedure(s): TRANSESOPHAGEAL ECHOCARDIOGRAM (TEE) (N/A) CARDIOVERSION (N/A) as a surgical intervention .  The patient's history has been reviewed, patient examined, no change in status, stable for surgery.  I have reviewed the patient's chart and labs.  Questions were answered to the patient's satisfaction.     Millee Denise [employer]

## 2012-12-13 NOTE — Anesthesia Preprocedure Evaluation (Signed)
Anesthesia Evaluation  Patient identified by MRN, date of birth, ID band Patient awake    Reviewed: Allergy & Precautions, H&P , NPO status , Patient's Chart, lab work & pertinent test results  Airway Mallampati: II TM Distance: >3 FB Neck ROM: Full    Dental  (+) Teeth Intact   Pulmonary    Pulmonary exam normal       Cardiovascular hypertension, Pt. on medications + CAD and +CHF + dysrhythmias Atrial Fibrillation Rhythm:Irregular Rate:Normal     Neuro/Psych    GI/Hepatic   Endo/Other    Renal/GU      Musculoskeletal   Abdominal Normal abdominal exam  (+)   Peds  Hematology   Anesthesia Other Findings   Reproductive/Obstetrics                           Anesthesia Physical Anesthesia Plan  ASA: III  Anesthesia Plan: MAC   Post-op Pain Management:    Induction: Intravenous  Airway Management Planned: Mask  Additional Equipment:   Intra-op Plan:   Post-operative Plan: Extubation in OR  Informed Consent: I have reviewed the patients History and Physical, chart, labs and discussed the procedure including the risks, benefits and alternatives for the proposed anesthesia with the patient or authorized representative who has indicated his/her understanding and acceptance.   Dental advisory given  Plan Discussed with: CRNA, Anesthesiologist and Surgeon  Anesthesia Plan Comments:         Anesthesia Quick Evaluation

## 2012-12-13 NOTE — Care Management Note (Addendum)
    Page 1 of 2   12/19/2012     12:01:52 PM   CARE MANAGEMENT NOTE 12/19/2012  Patient:  Cheryl Zamora, Cheryl Zamora   Account Number:  [id number]  Date Initiated:  12/13/2012  Documentation initiated by:  Junius Creamer  Subjective/Objective Assessment:   adm w at fib w rvr     Action/Plan:   lives alone, pcp dr Geoffry Paradise   Anticipated DC Date:  12/19/2012   Anticipated DC Plan:  HOME W HOME HEALTH SERVICES  In-house referral  Clinical Social Worker      DC Associate Professor  CM consult      [facility] Choice  HOME HEALTH   Choice offered to / List presented to:  C-2 HC POA / Guardian        HH arranged  HH-1 RN  HH-2 PT  HH-4 NURSE'S AIDE      HH agency  [facility]   Status of service:   Medicare Important Message given?   (If response is "NO", the following Medicare IM given date fields will be blank) Date Medicare IM given:   Date Additional Medicare IM given:    Discharge Disposition:  HOME W HOME HEALTH SERVICES  Per UR Regulation:  Reviewed for med. necessity/level of care/duration of stay  If discussed at Long Length of Stay Meetings, dates discussed:   12/19/2012    Comments:  10/21  1155 debbie Haven Foss rn,bsn have alerted donna w [facility] of disch for today.  10/20  1341 debbie Shamell Hittle rn,bsn spoke w nephew Orlie Pollen [phone]. ins will not cover snf and nephew would ike to get pt back to her apt w [facility]. went over list of [facility] agencies and nephew chose [facility]. he will plan to pick pt up in am. will alert md of change in plans.ref to debbie taylor w [facility] for rn-pt-aid.

## 2012-12-13 NOTE — Progress Notes (Addendum)
ANTICOAGULATION CONSULT NOTE - Follow Up consult  Pharmacy Consult for heparin, warfarin Indication: atrial fibrillation  Allergies  Allergen Reactions  . Codeine Nausea And Vomiting    Patient Measurements: Height: 5\' 3"  (160 cm) Weight: 156 lb 4.9 oz (70.9 kg) IBW/kg (Calculated) : 52.4 Heparin Dosing Weight: 71kg  Vital Signs: Temp: 97.9 F (36.6 C) (10/15 1004) Temp src: Oral (10/15 1004) BP: 97/41 mmHg (10/15 1230) Pulse Rate: 55 (10/15 1230)  Labs:  Recent Labs  12/12/12 1343 12/12/12 1535 12/12/12 1538 12/12/12 2256 12/13/12 0145 12/13/12 0635 12/13/12 1359  HGB 14.0  --   --   --   --  13.0  --   HCT 43.3  --   --   --   --  40.1  --   PLT 309  --   --   --   --  293  --   LABPROT  --   --   --   --   --   --  15.5*  INR  --   --   --   --   --   --  1.26  HEPARINUNFRC  --   --   --   --  0.19*  --  0.48  CREATININE  --  1.12*  --   --   --  1.44*  --   TROPONINI  --   --  <0.30 <0.30  --  <0.30  --     Estimated Creatinine Clearance: 26 ml/min (by C-G formula based on Cr of 1.44).   Medical History: Past Medical History  Diagnosis Date  . Hyperlipidemia   . Hypertension   . Coronary artery disease 2005  . History of diastolic dysfunction     well compensated  . Back problem   . Neuropathy     Assessment: [name] an 79 y.o F who was admitted 12/12/2012   referred to the ED by PCP for further evaluation of atrial fibrillation with RVR. Pharmacy consulted to start heparin and 10/15 warfarin.  Events: TEE cardioversion and warfarin start 10/15  Anticoagulation;Afib, :heparin warfarin, Heparin infusing at 1050 units/hr h/h & platelets stable , no bleeding noted.  No baseline INR  Cardiovascular: cad, hf, htn, HLP: ASA, furo 40 IV q6h, Dilt @15  mg/hr  Neurology: memory loss  Nephrology: CrCl 26 ml/min  PTA Medication Issues: Home medications not ordered:  Namenda  Goal of Therapy:  Heparin level 0.3-0.7 units/ml Monitor platelets by  anticoagulation protocol: Yes INR 2-3   Plan:  1) Continue heparin drip at 1050 units/hr, follow up heparin level, at goal 2) Follow up baseline INR, if WNL start warfarin 5 mg x 1 tonight.    Thank you for allowing pharmacy to be a part of this patients care team.  Lovenia Kim Pharm.D., BCPS Clinical Pharmacist 12/13/2012 1:30 PM Pager: [phone] Phone: [phone]

## 2012-12-13 NOTE — Anesthesia Postprocedure Evaluation (Signed)
  Anesthesia Post-op Note  Patient: Cheryl Zamora  Procedure(s) Performed: Procedure(s): TRANSESOPHAGEAL ECHOCARDIOGRAM (TEE) (N/A) CARDIOVERSION (N/A)  Patient Location: PACU and Short Stay  Anesthesia Type:MAC  Level of Consciousness: awake  Airway and Oxygen Therapy: Patient Spontanous Breathing  Post-op Pain: mild  Post-op Assessment: Post-op Vital signs reviewed  Post-op Vital Signs: Reviewed  Complications: No apparent anesthesia complications

## 2012-12-13 NOTE — Preoperative (Signed)
Beta Blockers   Reason not to administer Beta Blockers:Not Applicable 
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
## 2012-12-13 NOTE — Transfer of Care (Signed)
Immediate Anesthesia Transfer of Care Note  Patient: Cheryl Zamora  Procedure(s) Performed: Procedure(s): TRANSESOPHAGEAL ECHOCARDIOGRAM (TEE) (N/A) CARDIOVERSION (N/A)  Patient Location: Endoscopy Unit  Anesthesia Type:MAC  Level of Consciousness: awake, alert  and oriented  Airway & Oxygen Therapy: Patient Spontanous Breathing and Patient connected to nasal cannula oxygen  Post-op Assessment: Report given to PACU RN  Post vital signs: Reviewed and stable  Complications: No apparent anesthesia complications

## 2012-12-13 NOTE — Procedures (Signed)
Electrical Cardioversion Procedure Note SAYDEE ZOLMAN [id number] [birth year]  Procedure: Electrical Cardioversion Indications:  Atrial Fibrillation.  Patient has been on IV heparin.  TEE showed no LA appendage thrombus.   Procedure Details Consent: Risks of procedure as well as the alternatives and risks of each were explained to the (patient/caregiver).  Consent for procedure obtained. Time Out: Verified patient identification, verified procedure, site/side was marked, verified correct patient position, special equipment/implants available, medications/allergies/relevent history reviewed, required imaging and test results available.  Performed  Patient placed on cardiac monitor, pulse oximetry, supplemental oxygen as necessary.  Sedation given: Propofol per anesthesiology Pacer pads placed anterior and posterior chest.  Cardioverted 1 time(s).  Cardioverted at 200J.  Evaluation Findings: Post procedure EKG shows: NSR but with frequent PACs and atrial runs.  Complications: None Patient did tolerate procedure well.   Marca Ancona 12/13/2012, 12:00 PM

## 2012-12-13 NOTE — Anesthesia Postprocedure Evaluation (Signed)
  Anesthesia Post-op Note  Patient: Cheryl Zamora  Procedure(s) Performed: Procedure(s): TRANSESOPHAGEAL ECHOCARDIOGRAM (TEE) (N/A) CARDIOVERSION (N/A)  Patient Location: Endoscopy Unit  Anesthesia Type:MAC  Level of Consciousness: awake, alert  and oriented  Airway and Oxygen Therapy: Patient Spontanous Breathing and Patient connected to nasal cannula oxygen  Post-op Pain: none  Post-op Assessment: Post-op Vital signs reviewed, Patient's Cardiovascular Status Stable, Respiratory Function Stable, Patent Airway, No signs of Nausea or vomiting, Pain level controlled and No headache  Post-op Vital Signs: Reviewed and stable  Complications: No apparent anesthesia complications

## 2012-12-14 ENCOUNTER — Encounter (HOSPITAL_COMMUNITY): Payer: Self-pay | Admitting: Cardiology

## 2012-12-14 LAB — CBC
HCT: 37.5 % (ref 36.0–46.0)
Hemoglobin: 12 g/dL (ref 12.0–15.0)
MCH: 28 pg (ref 26.0–34.0)
MCHC: 32 g/dL (ref 30.0–36.0)
MCV: 87.6 fL (ref 78.0–100.0)
Platelets: 237 10*3/uL (ref 150–400)
RBC: 4.28 MIL/uL (ref 3.87–5.11)
RDW: 14.1 % (ref 11.5–15.5)
WBC: 9.9 10*3/uL (ref 4.0–10.5)

## 2012-12-14 LAB — BASIC METABOLIC PANEL
Calcium: 8.9 mg/dL (ref 8.4–10.5)
GFR calc Af Amer: 38 mL/min — ABNORMAL LOW (ref >90)
GFR calc non Af Amer: 33 mL/min — ABNORMAL LOW (ref >90)
Potassium: 3.1 mEq/L — ABNORMAL LOW (ref 3.5–5.1)
Sodium: 137 mEq/L (ref 135–145)

## 2012-12-14 LAB — PROTIME-INR
INR: 1.29 (ref 0.00–1.49)
Prothrombin Time: 15.8 seconds — ABNORMAL HIGH (ref 11.6–15.2)

## 2012-12-14 LAB — HEPARIN LEVEL (UNFRACTIONATED): Heparin Unfractionated: 0.39 IU/mL (ref 0.30–0.70)

## 2012-12-14 MED ORDER — POTASSIUM CHLORIDE CRYS ER 20 MEQ PO TBCR
40.0000 meq | EXTENDED_RELEASE_TABLET | Freq: Once | ORAL | Status: AC
  Administered 2012-12-14: 40 meq via ORAL
  Filled 2012-12-14: qty 2

## 2012-12-14 MED ORDER — METOPROLOL TARTRATE 12.5 MG HALF TABLET
12.5000 mg | ORAL_TABLET | Freq: Two times a day (BID) | ORAL | Status: DC
  Administered 2012-12-14 – 2012-12-19 (×10): 12.5 mg via ORAL
  Filled 2012-12-14 (×12): qty 1

## 2012-12-14 MED ORDER — WARFARIN SODIUM 5 MG PO TABS
5.0000 mg | ORAL_TABLET | Freq: Once | ORAL | Status: AC
  Administered 2012-12-14: 5 mg via ORAL
  Filled 2012-12-14: qty 1

## 2012-12-14 NOTE — Progress Notes (Signed)
  Thank you for allowing pharmacy to be a part of this patients care team.  Lovenia Kim Pharm.D., BCPS Clinical Pharmacist 12/14/2012 11:37 AM Pager: [phone] Phone: [phone]

## 2012-12-14 NOTE — Progress Notes (Signed)
Patient seen and examined. Primarily cardiac admission. Successful cardioversion on iv heparin. Alert and mentally at baseline. Will follow and support.

## 2012-12-14 NOTE — Progress Notes (Signed)
     Subjective:  Maintaining NSR since TEE cardioversion yesterday. On heparin, warfarin. On IV diltiazem. BP soft. K low.  Objective:  Vital Signs in the last 24 hours: Temp:  [97.9 F (36.6 C)-98.5 F (36.9 C)] 98.4 F (36.9 C) (10/16 0800) Pulse Rate:  [47-156] 69 (10/16 0800) Resp:  [17-33] 22 (10/16 0800) BP: (85-147)/(34-78) 106/55 mmHg (10/16 0800) SpO2:  [90 %-97 %] 92 % (10/16 0800) Weight:  [153 lb 3.5 oz (69.5 kg)] 153 lb 3.5 oz (69.5 kg) (10/16 0500)  Intake/Output from previous day: 10/15 0701 - 10/16 0700 In: 911 [P.O.:360; I.V.:551] Out: 2590 [Urine:2590] Intake/Output from this shift:    . aspirin EC  81 mg Oral Daily  . furosemide  40 mg Intravenous Q6H  . sodium chloride  3 mL Intravenous Q12H  . Warfarin - Pharmacist Dosing Inpatient   Does not apply q1800   . sodium chloride 250 mL (12/13/12 1900)  . diltiazem (CARDIZEM) infusion 15 mg/hr (12/13/12 0811)  . heparin 1,050 Units/hr (12/13/12 2351)    Physical Exam: The patient appears to be in no distress.  Head and neck exam reveals that the pupils are equal and reactive.  The extraocular movements are full.  There is no scleral icterus.  Mouth and pharynx are benign.  No lymphadenopathy.  No carotid bruits.  The jugular venous pressure is normal.  Thyroid is not enlarged or tender.  Chest is clear to percussion and auscultation.  No rales or rhonchi.  Expansion of the chest is symmetrical.  Heart reveals no abnormal lift or heave.  First and second heart sounds are normal.  There is no murmur gallop rub or click.  The abdomen is soft and nontender.  Bowel sounds are normoactive.  There is no hepatosplenomegaly or mass.  There are no abdominal bruits.  Extremities reveal no phlebitis or edema.  Pedal pulses are good.  There is no cyanosis or clubbing.  Neurologic exam is normal strength and no lateralizing weakness.  No sensory deficits.  Integument reveals no rash  Lab Results:  Recent Labs  12/13/12 0635 12/14/12 0440  WBC 11.0* 9.9  HGB 13.0 12.0  PLT 293 237    Recent Labs  12/13/12 0635 12/14/12 0440  NA 136 137  K 3.8 3.1*  CL 96 95*  CO2 30 32  GLUCOSE 118* 101*  BUN 16 16  CREATININE 1.44* 1.39*    Recent Labs  12/12/12 2256 12/13/12 0635  TROPONINI <0.30 <0.30   Hepatic Function Panel  Recent Labs  12/12/12 1535  PROT 7.3  ALBUMIN 4.0  AST 29  ALT 17  ALKPHOS 59  BILITOT 0.8   No results found for this basename: CHOL,  in the last 72 hours No results found for this basename: PROTIME,  in the last 72 hours  Imaging: Imaging results have been reviewed  Cardiac Studies: Telemetry shows NSR with frequent PACs Assessment/Plan:  1. Atrial fibrillation with RVR. Holding NSR. 2. Acute systolic CHF.  EF 40% 3. CAD s/p CABG. Cardiac enzymes negative.  4. Anticoagulation. On IV heparin. Will need oral therapy post DCCV. Given age and CKD with GFR of 32 options are either low dose Eliquis versus coumadin. I would favor Coumadin given the above.  5. HTN. 6. Hypokalemia.  Plan: Switch to oral beta blocker. Continue warfarin loading. Replete K   LOS: 2 days    Cassell Clement 12/14/2012, 8:23 AM

## 2012-12-14 NOTE — Progress Notes (Signed)
ANTICOAGULATION CONSULT NOTE - Follow Up consult  Pharmacy Consult for heparin, warfarin Indication: atrial fibrillation  Allergies  Allergen Reactions  . Codeine Nausea And Vomiting    Patient Measurements: Height: 5\' 3"  (160 cm) Weight: 153 lb 3.5 oz (69.5 kg) IBW/kg (Calculated) : 52.4 Heparin Dosing Weight: 71kg  Vital Signs: Temp: 98.4 F (36.9 C) (10/16 0800) Temp src: Oral (10/16 0408) BP: 106/55 mmHg (10/16 0800) Pulse Rate: 69 (10/16 0800)  Labs:  Recent Labs  12/12/12 1343 12/12/12 1535 12/12/12 1538 12/12/12 2256 12/13/12 0145 12/13/12 0635 12/13/12 1359 12/14/12 0440  HGB 14.0  --   --   --   --  13.0  --  12.0  HCT 43.3  --   --   --   --  40.1  --  37.5  PLT 309  --   --   --   --  293  --  237  LABPROT  --   --   --   --   --   --  15.5* 15.8*  INR  --   --   --   --   --   --  1.26 1.29  HEPARINUNFRC  --   --   --   --  0.19*  --  0.48 0.39  CREATININE  --  1.12*  --   --   --  1.44*  --  1.39*  TROPONINI  --   --  <0.30 <0.30  --  <0.30  --   --     Estimated Creatinine Clearance: 26.6 ml/min (by C-G formula based on Cr of 1.39).   Medical History: Past Medical History  Diagnosis Date  . Hyperlipidemia   . Hypertension   . Coronary artery disease 2005  . History of diastolic dysfunction     well compensated  . Back problem   . Neuropathy     Assessment: [name] an 79 y.o F who was admitted 12/12/2012   referred to the ED by PCP for further evaluation of atrial fibrillation with RVR. Pharmacy consulted to start heparin and 10/15 warfarin.  Events: Maintaining NSR since TEE on 10/15.   Anticoagulation;Afib, :heparin warfarin, Heparin infusing at 1050 units/hr h/h & platelets stable , no bleeding noted.  No baseline INR. No s/s of bleeding noted  Cardiovascular: cad, hf, htn, HLP: ASA, furo 40 IV q6h, Dilt @15  mg/hr  Neurology: memory loss  Nephrology: CrCl 26 ml/min  PTA Medication Issues: Home medications not ordered:   Namenda  Goal of Therapy:  Heparin level 0.3-0.7 units/ml Monitor platelets by anticoagulation protocol: Yes INR 2-3   Plan:  1) Continue heparin drip at 1050 units/hr, follow up heparin level, at goal 2) Give coumadin 5 mg once tonight. Follow up INR in AM    Vinnie Level, PharmD.  [place] License #[id number] Application for [place] reciprocity pending  Clinical Pharmacist Pager [phone]

## 2012-12-15 LAB — CBC
HCT: 40.5 % (ref 36.0–46.0)
Hemoglobin: 13.4 g/dL (ref 12.0–15.0)
MCH: 29.1 pg (ref 26.0–34.0)
MCHC: 33.1 g/dL (ref 30.0–36.0)
MCV: 87.9 fL (ref 78.0–100.0)
Platelets: 247 10*3/uL (ref 150–400)
RBC: 4.61 MIL/uL (ref 3.87–5.11)
RDW: 14 % (ref 11.5–15.5)
WBC: 11.3 10*3/uL — ABNORMAL HIGH (ref 4.0–10.5)

## 2012-12-15 LAB — BASIC METABOLIC PANEL
CO2: 32 mEq/L (ref 19–32)
Calcium: 9 mg/dL (ref 8.4–10.5)
GFR calc Af Amer: 43 mL/min — ABNORMAL LOW (ref >90)
GFR calc non Af Amer: 37 mL/min — ABNORMAL LOW (ref >90)
Sodium: 139 mEq/L (ref 135–145)

## 2012-12-15 LAB — PROTIME-INR
INR: 1.28 (ref 0.00–1.49)
Prothrombin Time: 15.7 seconds — ABNORMAL HIGH (ref 11.6–15.2)

## 2012-12-15 LAB — HEPARIN LEVEL (UNFRACTIONATED)
Heparin Unfractionated: 0.87 IU/mL — ABNORMAL HIGH (ref 0.30–0.70)
Heparin Unfractionated: 0.64 IU/mL (ref 0.30–0.70)

## 2012-12-15 MED ORDER — FUROSEMIDE 40 MG PO TABS
40.0000 mg | ORAL_TABLET | Freq: Two times a day (BID) | ORAL | Status: DC
  Administered 2012-12-15 – 2012-12-17 (×5): 40 mg via ORAL
  Filled 2012-12-15 (×8): qty 1

## 2012-12-15 MED ORDER — POTASSIUM CHLORIDE CRYS ER 20 MEQ PO TBCR
20.0000 meq | EXTENDED_RELEASE_TABLET | Freq: Three times a day (TID) | ORAL | Status: DC
  Administered 2012-12-15 – 2012-12-17 (×7): 20 meq via ORAL
  Filled 2012-12-15 (×8): qty 1

## 2012-12-15 MED ORDER — WARFARIN SODIUM 7.5 MG PO TABS
7.5000 mg | ORAL_TABLET | Freq: Once | ORAL | Status: AC
  Administered 2012-12-15: 7.5 mg via ORAL
  Filled 2012-12-15: qty 1

## 2012-12-15 NOTE — Progress Notes (Signed)
CARDIAC REHAB PHASE I   PRE:  Rate/Rhythm: 66SR  BP:  Supine:   Sitting: 108/47  Standing:    SaO2: 94%RA  MODE:  Ambulation: 170 ft   POST:  Rate/Rhythm: 80SR  BP:  Supine:   Sitting: 130/72  Standing:    SaO2: 94%RA 1100-1140 Pt wanted to use cane not walker to walk. Used gait belt to help steady pt as she felt like she might fall once. Pt held to her cane but also to the IV pole so I think walker would be safer for her. Pt lives alone and stated she was not planning to have anyone stay with her at discharge but if needed her nephew is near by. I do not feel that pt is safe at this point to return home by herself and would like PT to evaluate for home needs. Asked pt's RN to obtain PT consult. Pt is deconditioned. We will see tomorrow and try to get pt to use walker. Gave pt CHF booklet and reviewed zones. Pt knows to call MD if she gains 3 lbs overnight or 5 lbs in week. Gave low sodium handouts. To recliner with call bell after walk.   Luetta Nutting, RN BSN  12/15/2012 11:35 AM

## 2012-12-15 NOTE — Progress Notes (Signed)
ANTICOAGULATION CONSULT NOTE - Follow Up consult  Pharmacy Consult for heparin Indication: atrial fibrillation  Allergies  Allergen Reactions  . Codeine Nausea And Vomiting    Patient Measurements: Height: 5\' 3"  (160 cm) Weight: 151 lb 0.2 oz (68.5 kg) IBW/kg (Calculated) : 52.4 Heparin Dosing Weight: 71kg  Vital Signs: Temp: 97.9 F (36.6 C) (10/17 0339) Temp src: Oral (10/16 1959) BP: 129/99 mmHg (10/17 0339) Pulse Rate: 77 (10/16 1959)  Labs:  Recent Labs  12/12/12 1538 12/12/12 2256  12/13/12 0635 12/13/12 1359 12/14/12 0440 12/15/12 0531  HGB  --   --   --  13.0  --  12.0 13.4  HCT  --   --   --  40.1  --  37.5 40.5  PLT  --   --   --  293  --  237 247  LABPROT  --   --   --   --  15.5* 15.8* 15.7*  INR  --   --   --   --  1.26 1.29 1.28  HEPARINUNFRC  --   --   < >  --  0.48 0.39 0.87*  CREATININE  --   --   --  1.44*  --  1.39* 1.27*  TROPONINI <0.30 <0.30  --  <0.30  --   --   --   < > = values in this interval not displayed.  Estimated Creatinine Clearance: 29 ml/min (by C-G formula based on Cr of 1.27).   Medical History: Past Medical History  Diagnosis Date  . Hyperlipidemia   . Hypertension   . Coronary artery disease 2005  . History of diastolic dysfunction     well compensated  . Back problem   . Neuropathy     Assessment: [name] an 79 y.o F who was admitted 12/12/2012   referred to the ED by PCP for further evaluation of atrial fibrillation with RVR. Pharmacy consulted to start heparin and 10/15 warfarin. Heparin level this am elevated at 0.87 units/ml.  Goal of Therapy:  Heparin level 0.3-0.7 units/ml Monitor platelets by anticoagulation protocol: Yes    Plan:  1) Decrease heparin drip to 850 units/hr, follow up heparin level in 8 hours  Talbert Cage, PharmD.  Clinical Pharmacist

## 2012-12-15 NOTE — Progress Notes (Addendum)
ANTICOAGULATION CONSULT NOTE - Follow Up consult  Pharmacy Consult for heparin, warfarin Indication: atrial fibrillation  Allergies  Allergen Reactions  . Codeine Nausea And Vomiting    Patient Measurements: Height: 5\' 3"  (160 cm) Weight: 151 lb 0.2 oz (68.5 kg) IBW/kg (Calculated) : 52.4 Heparin Dosing Weight: 71kg  Vital Signs: Temp: 98.6 F (37 C) (10/17 0804) Temp src: Oral (10/17 0804) BP: 100/48 mmHg (10/17 0804) Pulse Rate: 67 (10/17 0804)  Labs:  Recent Labs  12/12/12 1538 12/12/12 2256  12/13/12 0635 12/13/12 1359 12/14/12 0440 12/15/12 0531  HGB  --   --   --  13.0  --  12.0 13.4  HCT  --   --   --  40.1  --  37.5 40.5  PLT  --   --   --  293  --  237 247  LABPROT  --   --   --   --  15.5* 15.8* 15.7*  INR  --   --   --   --  1.26 1.29 1.28  HEPARINUNFRC  --   --   < >  --  0.48 0.39 0.87*  CREATININE  --   --   --  1.44*  --  1.39* 1.27*  TROPONINI <0.30 <0.30  --  <0.30  --   --   --   < > = values in this interval not displayed.  Estimated Creatinine Clearance: 29 ml/min (by C-G formula based on Cr of 1.27).   Medical History: Past Medical History  Diagnosis Date  . Hyperlipidemia   . Hypertension   . Coronary artery disease 2005  . History of diastolic dysfunction     well compensated  . Back problem   . Neuropathy     Assessment: [name] an 79 y.o F who was admitted 12/12/2012   referred to the ED by PCP for further evaluation of atrial fibrillation with RVR. Pharmacy consulted to start heparin and 10/15 warfarin. Very little change in INR. Plans for possible discharge this weekend.   Events: Maintaining NSR since TEE on 10/15.   Anticoagulation;Afib, :heparin warfarin, Heparin infusing at 850 units/hr h/h & platelets stable , no bleeding noted.  No baseline INR. No s/s of bleeding noted  Cardiovascular: cad, hf, htn, HLP: ASA, furo 40 po bid, Dilt @15  mg/hr  Neurology: memory loss  Nephrology: CrCl 29 ml/min  PTA Medication  Issues: Home medications not ordered:  Namenda  Goal of Therapy:  Heparin level 0.3-0.7 units/ml Monitor platelets by anticoagulation protocol: Yes INR 2-3   Plan:  1) Heparin drip was decreased to 850 units/hr. Will follow up with heparin level at 1530, at goal 2) Give coumadin 7.5 mg once tonight. Follow up INR in AM  3) Monitor for signs and symptoms of bleeding.    Vinnie Level, PharmD.  [place] License #[id number] Application for [place] reciprocity pending  Clinical Pharmacist Pager [phone]  Thank you for allowing pharmacy to be a part of this patients care team.  Lovenia Kim Pharm.D., BCPS Clinical Pharmacist 12/15/2012 1:24 PM Pager: [phone] Phone: [phone]    Heparin level collected on time was within goal range (HL 0.64, Range 0.3-0.7). Will continue at current heparin rate of 850 units/hr. Recheck AM heparin level on 10/17.   Vinnie Level, PharmD.  [place] License #[id number] Application for [place] reciprocity pending  Clinical Pharmacist Pager [phone]

## 2012-12-15 NOTE — Progress Notes (Signed)
     Subjective:  Feels well . No chest pain .  Dyspnea improved. Rhythm NSR. BP soft but acceptable on BB.  Potassium still low. Edema has cleared.  Objective:  Vital Signs in the last 24 hours: Temp:  [97.9 F (36.6 C)-99.3 F (37.4 C)] 98.6 F (37 C) (10/17 0804) Pulse Rate:  [67-92] 67 (10/17 0804) Resp:  [13-21] 16 (10/16 2206) BP: (97-132)/(29-99) 100/48 mmHg (10/17 0804) SpO2:  [91 %-95 %] 93 % (10/17 0804) Weight:  [151 lb 0.2 oz (68.5 kg)] 151 lb 0.2 oz (68.5 kg) (10/17 0500)  Intake/Output from previous day: 10/16 0701 - 10/17 0700 In: 1051 [P.O.:600; I.V.:451] Out: 2625 [Urine:2625] Intake/Output from this shift:    . aspirin EC  81 mg Oral Daily  . furosemide  40 mg Intravenous Q6H  . metoprolol tartrate  12.5 mg Oral BID  . sodium chloride  3 mL Intravenous Q12H  . Warfarin - Pharmacist Dosing Inpatient   Does not apply q1800   . sodium chloride 250 mL (12/13/12 1900)  . diltiazem (CARDIZEM) infusion 15 mg/hr (12/13/12 0811)  . heparin 850 Units/hr (12/15/12 1610)    Physical Exam: The patient appears to be in no distress.  Head and neck exam reveals that the pupils are equal and reactive.  The extraocular movements are full.  There is no scleral icterus.  Mouth and pharynx are benign.  No lymphadenopathy.  No carotid bruits.  The jugular venous pressure is normal.  Thyroid is not enlarged or tender.  Chest is clear to percussion and auscultation.  No rales or rhonchi.  Expansion of the chest is symmetrical.  Heart reveals no abnormal lift or heave.  First and second heart sounds are normal.  There is no murmur gallop rub or click.  The abdomen is soft and nontender.  Bowel sounds are normoactive.  There is no hepatosplenomegaly or mass.  There are no abdominal bruits.  Extremities reveal no phlebitis or edema.  Pedal pulses are good.  There is no cyanosis or clubbing.  Neurologic exam is normal strength and no lateralizing weakness.  No sensory  deficits.  Integument reveals no rash  Lab Results:  Recent Labs  12/14/12 0440 12/15/12 0531  WBC 9.9 11.3*  HGB 12.0 13.4  PLT 237 247    Recent Labs  12/14/12 0440 12/15/12 0531  NA 137 139  K 3.1* 3.1*  CL 95* 96  CO2 32 32  GLUCOSE 101* 99  BUN 16 15  CREATININE 1.39* 1.27*    Recent Labs  12/12/12 2256 12/13/12 0635  TROPONINI <0.30 <0.30   Hepatic Function Panel  Recent Labs  12/12/12 1535  PROT 7.3  ALBUMIN 4.0  AST 29  ALT 17  ALKPHOS 59  BILITOT 0.8   No results found for this basename: CHOL,  in the last 72 hours No results found for this basename: PROTIME,  in the last 72 hours  Imaging: Imaging results have been reviewed  Cardiac Studies: Telemetry shows NSR . Assessment/Plan:  1. Atrial fibrillation with RVR. Holding NSR. 2. Acute systolic CHF.  EF 40% 3. CAD s/p CABG. Cardiac enzymes negative.  4. Anticoagulation. On IV heparin. Transition to warfarin per pharmacy. 5. HTN. 6. Hypokalemia.  Plan: Switch to oral beta blocker. Continue warfarin loading. Replete K. Switch to oral lasix. Ambulate with cardiac rehab.  Possibly home over weekend.   LOS: 3 days    Cheryl Zamora 12/15/2012, 8:32 AM

## 2012-12-16 DIAGNOSIS — I25810 Atherosclerosis of coronary artery bypass graft(s) without angina pectoris: Secondary | ICD-10-CM

## 2012-12-16 LAB — HEPARIN LEVEL (UNFRACTIONATED)
Heparin Unfractionated: 0.76 IU/mL — ABNORMAL HIGH (ref 0.30–0.70)
Heparin Unfractionated: 0.39 IU/mL (ref 0.30–0.70)

## 2012-12-16 LAB — BASIC METABOLIC PANEL
BUN: 21 mg/dL (ref 6–23)
CO2: 35 mEq/L — ABNORMAL HIGH (ref 19–32)
Calcium: 9.9 mg/dL (ref 8.4–10.5)
Chloride: 97 mEq/L (ref 96–112)
Creatinine, Ser: 1.4 mg/dL — ABNORMAL HIGH (ref 0.50–1.10)
GFR calc Af Amer: 38 mL/min — ABNORMAL LOW (ref >90)
GFR calc non Af Amer: 33 mL/min — ABNORMAL LOW (ref >90)
Glucose, Bld: 106 mg/dL — ABNORMAL HIGH (ref 70–99)
Potassium: 4.6 mEq/L (ref 3.5–5.1)
Sodium: 141 mEq/L (ref 135–145)

## 2012-12-16 LAB — CBC
HCT: 44.2 % (ref 36.0–46.0)
Hemoglobin: 14.7 g/dL (ref 12.0–15.0)
MCH: 29.2 pg (ref 26.0–34.0)
MCHC: 33.3 g/dL (ref 30.0–36.0)
MCV: 87.7 fL (ref 78.0–100.0)
Platelets: 271 10*3/uL (ref 150–400)
RBC: 5.04 MIL/uL (ref 3.87–5.11)
RDW: 14 % (ref 11.5–15.5)
WBC: 11.8 10*3/uL — ABNORMAL HIGH (ref 4.0–10.5)

## 2012-12-16 LAB — PROTIME-INR
INR: 1.31 (ref 0.00–1.49)
Prothrombin Time: 16 seconds — ABNORMAL HIGH (ref 11.6–15.2)

## 2012-12-16 MED ORDER — WARFARIN SODIUM 10 MG PO TABS
10.0000 mg | ORAL_TABLET | Freq: Once | ORAL | Status: AC
  Administered 2012-12-16: 10 mg via ORAL
  Filled 2012-12-16: qty 1

## 2012-12-16 NOTE — Progress Notes (Signed)
CARDIAC REHAB PHASE I   PRE:  Rate/Rhythm: 64 sr  BP:  Sitting: 152/84     SaO2: 94 ra  MODE:  Ambulation: 300 ft   POST:  Rate/Rhythm: 81 sr  BP:  Sitting: 139/90     SaO2: 97 ra  14:34-15:13 Patient walked slow but steady with a walker and assist x1.  Patient needed to stop once because she said she had a "crick" in her back.  Patient did not c/o of sob.   Theresa Duty, [place] 12/16/2012 3:10 PM

## 2012-12-16 NOTE — Progress Notes (Signed)
Patient ID: Cheryl Zamora, female   DOB: 05/25/1923, 79 y.o.   MRN: [id number]     Subjective:  No complaints Looking forward to going home   Objective:  Vital Signs in the last 24 hours: Temp:  [97.3 F (36.3 C)-98.9 F (37.2 C)] 97.3 F (36.3 C) (10/18 0744) Pulse Rate:  [66-71] 66 (10/17 1610) BP: (117-150)/(39-71) 117/50 mmHg (10/18 0744) SpO2:  [94 %-96 %] 95 % (10/18 0744) Weight:  [149 lb 0.5 oz (67.6 kg)] 149 lb 0.5 oz (67.6 kg) (10/18 0500)  Intake/Output from previous day: 10/17 0701 - 10/18 0700 In: 1504 [P.O.:1080; I.V.:424] Out: 2900 [Urine:2900] Intake/Output from this shift:    . aspirin EC  81 mg Oral Daily  . furosemide  40 mg Oral BID  . metoprolol tartrate  12.5 mg Oral BID  . potassium chloride  20 mEq Oral TID  . sodium chloride  3 mL Intravenous Q12H  . Warfarin - Pharmacist Dosing Inpatient   Does not apply q1800   . sodium chloride 10 mL/hr at 12/15/12 1900  . diltiazem (CARDIZEM) infusion 15 mg/hr (12/13/12 0811)  . heparin 700 Units/hr (12/16/12 5621)    Physical Exam: The patient appears to be in no distress.  Head and neck exam reveals that the pupils are equal and reactive.  The extraocular movements are full.  There is no scleral icterus.  Mouth and pharynx are benign.  No lymphadenopathy.  No carotid bruits.  The jugular venous pressure is normal.  Thyroid is not enlarged or tender.  Chest is clear to percussion and auscultation.  No rales or rhonchi.  Expansion of the chest is symmetrical. Post sternotomy  Heart reveals no abnormal lift or heave.  First and second heart sounds are normal.  There is no murmur gallop rub or click.  The abdomen is soft and nontender.  Bowel sounds are normoactive.  There is no hepatosplenomegaly or mass.  There are no abdominal bruits.  Extremities reveal no phlebitis or edema.  Pedal pulses are good.  There is no cyanosis or clubbing.  Neurologic exam is normal strength and no lateralizing weakness.  No  sensory deficits.  Integument reveals no rash  Lab Results:  Recent Labs  12/15/12 0531 12/16/12 0354  WBC 11.3* 11.8*  HGB 13.4 14.7  PLT 247 271    Recent Labs  12/15/12 0531 12/16/12 0354  NA 139 141  K 3.1* 4.6  CL 96 97  CO2 32 35*  GLUCOSE 99 106*  BUN 15 21  CREATININE 1.27* 1.40*    Imaging: Imaging results have been reviewed  Cardiac Studies: Telemetry shows NSR . Assessment/Plan:  1. Atrial fibrillation with RVR. Holding NSR. D/c cardizem drip continue beta blocker   INR only 1.31  Pharmacy dosing coumadin 2. Acute systolic CHF.  EF 40% Improved continue lasix 40 bid K better today  3. CAD s/p CABG. Cardiac enzymes negative.  4. Anticoagulation. On IV heparin. Transition to warfarin per pharmacy. 5. HTN.     LOS: 4 days    Charlton Haws 12/16/2012, 8:59 AM

## 2012-12-16 NOTE — Progress Notes (Signed)
Received report from [place], [place] @ [phone].  Assumed care of patient.  [name] resting in the chair with eyes closed.  NAD noted.  Heparin gtt infusing as documented.  Denies pain at this time.  Will continue to monitor.

## 2012-12-16 NOTE — Evaluation (Signed)
Physical Therapy Evaluation Patient Details Name: Cheryl Zamora MRN: [id number] DOB: [birth year] Today's Date: 12/16/2012 Time: 4098-1191 PT Time Calculation (min): 30 min  PT Assessment / Plan / Recommendation History of Present Illness  Cheryl Zamora is a 79 y.o. female with a history of CAD, no hx arrhythmia. She was noted to have an elevated HR on home BP machine yest, saw Dr. Timothy Lasso today and was found to be in rapid atrial fibrillation and heart failure. cardioversion 10/15  Clinical Impression  Pt is pleasant female with decreased insight, safety awareness and memory along with below deficits and will benefit from acute therapy to maximize mobility, safety awareness and function to decrease burden of care. Pt unable to recall what to do in the event of an emergency at home, unable to carryover instruction for DME use, and unable to ascend/descend stairs without assist and pt states she has been leaving the home at times unassisted and driving to the store. Discussed with pt and nephew need for 24hr care at this time and pt agreeable to ST-SNF to maximize function as pt without available 24hr assist from family. Will follow. RN aware of plan and need to use RW with gait at all times due to impaired balance.     PT Assessment  Patient needs continued PT services    Follow Up Recommendations  SNF;Supervision/Assistance - 24 hour    Does the patient have the potential to tolerate intense rehabilitation      Barriers to Discharge Decreased caregiver support      Equipment Recommendations  None recommended by PT    Recommendations for Other Services     Frequency Min 3X/week    Precautions / Restrictions Precautions Precautions: Fall   Pertinent Vitals/Pain No pain HR 66-75      Mobility  Bed Mobility Bed Mobility: Not assessed Details for Bed Mobility Assistance: pt in bathroom on arrival  Transfers Transfers: Sit to Stand;Stand to Sit Sit to Stand: From toilet;5:  Supervision Stand to Sit: To chair/3-in-1;5: Supervision Details for Transfer Assistance: cueing for safety and hand placement Ambulation/Gait Ambulation/Gait Assistance: 5: Supervision Ambulation Distance (Feet): 150 Feet Assistive device: Rolling walker Ambulation/Gait Assistance Details: cueing for posture and position in RW, pt maintains self posterior to RW Gait Pattern: Step-through pattern;Decreased stride length;Trunk flexed Gait velocity: decreased Stairs: Yes Stairs Assistance: 4: Min guard Stair Management Technique: Sideways;One rail Left;Step to pattern Number of Stairs: 2    Exercises     PT Diagnosis: Difficulty walking;Altered mental status;Generalized weakness  PT Problem List: Decreased strength;Decreased cognition;Decreased activity tolerance;Decreased mobility;Decreased balance;Decreased knowledge of use of DME PT Treatment Interventions: Gait training;Stair training;Functional mobility training;Therapeutic activities;Therapeutic exercise;Patient/family education;Cognitive remediation;DME instruction;Balance training     PT Goals(Current goals can be found in the care plan section) Acute Rehab PT Goals Patient Stated Goal: be able to remain independent PT Goal Formulation: With patient/family Time For Goal Achievement: 12/30/12 Potential to Achieve Goals: Good  Visit Information  Last PT Received On: 12/16/12 Assistance Needed: +1 History of Present Illness: Cheryl Zamora is a 79 y.o. female with a history of CAD, no hx arrhythmia. She was noted to have an elevated HR on home BP machine yest, saw Dr. Timothy Lasso today and was found to be in rapid atrial fibrillation and heart failure. cardioversion 10/15       Prior Functioning  Home Living Family/patient expects to be discharged to:: Private residence Living Arrangements: Alone;Other relatives Available Help at Discharge: Available PRN/intermittently Type of Home: House  Home Access: Stairs to enter Entrance  Stairs-Number of Steps: 2 Entrance Stairs-Rails: Left Home Layout: One level Home Equipment: Walker - 2 wheels;Walker - standard;Cane - single point Prior Function Level of Independence: Needs assistance Gait / Transfers Assistance Needed: pt has been using cane at home ADL's / Homemaking Assistance Needed: pt has housekeeper, eats out or family brings meals-pt does not cook Communication Communication: No difficulties    Cognition  Cognition Arousal/Alertness: Awake/alert Behavior During Therapy: WFL for tasks assessed/performed Overall Cognitive Status: Impaired/Different from baseline Area of Impairment: Memory;Safety/judgement;Problem solving Memory: Decreased short-term memory Safety/Judgement: Decreased awareness of safety;Decreased awareness of deficits Problem Solving: Slow processing;Requires verbal cues    Extremity/Trunk Assessment Upper Extremity Assessment Upper Extremity Assessment: Generalized weakness Lower Extremity Assessment Lower Extremity Assessment: Generalized weakness (3/5 bil LE all myotomes) Cervical / Trunk Assessment Cervical / Trunk Assessment: Normal   Balance    End of Session PT - End of Session Equipment Utilized During Treatment: Gait belt Activity Tolerance: Patient tolerated treatment well Patient left: in chair;with call bell/phone within reach;with family/visitor present Nurse Communication: Mobility status  GP     Toney Sang Beth 12/16/2012, 10:06 AM Delaney Meigs, PT [phone]

## 2012-12-16 NOTE — Progress Notes (Signed)
ANTICOAGULATION CONSULT NOTE - Follow Up consult  Pharmacy Consult for heparin Indication: atrial fibrillation  Allergies  Allergen Reactions  . Codeine Nausea And Vomiting    Patient Measurements: Height: 5\' 3"  (160 cm) Weight: 151 lb 0.2 oz (68.5 kg) IBW/kg (Calculated) : 52.4 Heparin Dosing Weight: 71kg  Vital Signs: Temp: 98.9 F (37.2 C) (10/18 0300) Temp src: Oral (10/18 0300) BP: 141/48 mmHg (10/18 0040)  Labs:  Recent Labs  12/13/12 0635  12/14/12 0440 12/15/12 0531 12/15/12 1530 12/16/12 0354  HGB 13.0  --  12.0 13.4  --  14.7  HCT 40.1  --  37.5 40.5  --  44.2  PLT 293  --  237 247  --  271  LABPROT  --   < > 15.8* 15.7*  --  16.0*  INR  --   < > 1.29 1.28  --  1.31  HEPARINUNFRC  --   < > 0.39 0.87* 0.64 0.76*  CREATININE 1.44*  --  1.39* 1.27*  --   --   TROPONINI <0.30  --   --   --   --   --   < > = values in this interval not displayed.  Estimated Creatinine Clearance: 29 ml/min (by C-G formula based on Cr of 1.27).   Medical History: Past Medical History  Diagnosis Date  . Hyperlipidemia   . Hypertension   . Coronary artery disease 2005  . History of diastolic dysfunction     well compensated  . Back problem   . Neuropathy     Assessment: [name] an 79 y.o F who was admitted 12/12/2012   referred to the ED by PCP for further evaluation of atrial fibrillation with RVR. Pharmacy consulted to start heparin and 10/15 warfarin. Heparin level this am elevated at 0.76 units/ml.  Goal of Therapy:  Heparin level 0.3-0.7 units/ml Monitor platelets by anticoagulation protocol: Yes    Plan:  1) Decrease heparin drip to 700 units/hr, follow up heparin level in 8 hours  Talbert Cage, PharmD.  Clinical Pharmacist

## 2012-12-16 NOTE — Progress Notes (Addendum)
ANTICOAGULATION CONSULT NOTE - Follow Up consult  Pharmacy Consult for heparin, warfarin Indication: atrial fibrillation  Allergies  Allergen Reactions  . Codeine Nausea And Vomiting    Patient Measurements: Height: 5\' 3"  (160 cm) Weight: 149 lb 0.5 oz (67.6 kg) IBW/kg (Calculated) : 52.4 Heparin Dosing Weight: 71kg  Vital Signs: Temp: 98.3 F (36.8 C) (10/18 1116) Temp src: Oral (10/18 1116) BP: 140/69 mmHg (10/18 1116) Pulse Rate: 66 (10/18 1001)  Labs:  Recent Labs  12/14/12 0440 12/15/12 0531 12/15/12 1530 12/16/12 0354  HGB 12.0 13.4  --  14.7  HCT 37.5 40.5  --  44.2  PLT 237 247  --  271  LABPROT 15.8* 15.7*  --  16.0*  INR 1.29 1.28  --  1.31  HEPARINUNFRC 0.39 0.87* 0.64 0.76*  CREATININE 1.39* 1.27*  --  1.40*    Estimated Creatinine Clearance: 26.1 ml/min (by C-G formula based on Cr of 1.4).   Medical History: Past Medical History  Diagnosis Date  . Hyperlipidemia   . Hypertension   . Coronary artery disease 2005  . History of diastolic dysfunction     well compensated  . Back problem   . Neuropathy     Assessment: [name] an 79 y.o F who was admitted 12/12/2012   referred to the ED by PCP for further evaluation of atrial fibrillation with RVR. Pharmacy consulted to start heparin and 10/15 warfarin.  Events: NSR, feels better waiting for therapeutic INR on heparin.  Anticoagulation;Afib, :heparin warfarin, Heparin infusing at 1050 units/hr h/h & platelets stable , no bleeding noted.  No baseline INR  Cardiovascular: cad, hf, htn, HLP: ASA,metop, furo, K 20 TID (K 4.6)  Neurology: memory loss  Nephrology: CrCl 26 ml/min  PTA Medication Issues: Home medications not ordered:  Namenda  Goal of Therapy:  Heparin level 0.3-0.7 units/ml Monitor platelets by anticoagulation protocol: Yes INR 2-3   Plan:  1) Heparin @ 700 units/hr,  follow up heparin level this afternoon 2) Follow up baseline INR, if WNL start warfarin 10 mg x 1  tonight.    Thank you for allowing pharmacy to be a part of this patients care team.  Lovenia Kim Pharm.D., BCPS Clinical Pharmacist 12/16/2012 11:24 AM Pager: [phone] Phone: [phone]     1:45 PM  Heparin level at goal, continue current rate.  Check in am. Cheryl Zamora

## 2012-12-17 DIAGNOSIS — R5381 Other malaise: Secondary | ICD-10-CM

## 2012-12-17 LAB — PROTIME-INR
INR: 1.67 — ABNORMAL HIGH (ref 0.00–1.49)
Prothrombin Time: 19.2 seconds — ABNORMAL HIGH (ref 11.6–15.2)

## 2012-12-17 LAB — HEPARIN LEVEL (UNFRACTIONATED): Heparin Unfractionated: 0.47 IU/mL (ref 0.30–0.70)

## 2012-12-17 LAB — BASIC METABOLIC PANEL
BUN: 22 mg/dL (ref 6–23)
Calcium: 10.1 mg/dL (ref 8.4–10.5)
Chloride: 93 mEq/L — ABNORMAL LOW (ref 96–112)
Creatinine, Ser: 1.39 mg/dL — ABNORMAL HIGH (ref 0.50–1.10)
GFR calc Af Amer: 38 mL/min — ABNORMAL LOW (ref >90)
Potassium: 4.9 mEq/L (ref 3.5–5.1)

## 2012-12-17 LAB — CBC
HCT: 45.2 % (ref 36.0–46.0)
Hemoglobin: 14.7 g/dL (ref 12.0–15.0)
MCH: 28.4 pg (ref 26.0–34.0)
MCHC: 32.5 g/dL (ref 30.0–36.0)
MCV: 87.4 fL (ref 78.0–100.0)
Platelets: 262 10*3/uL (ref 150–400)
RBC: 5.17 MIL/uL — ABNORMAL HIGH (ref 3.87–5.11)
RDW: 13.9 % (ref 11.5–15.5)
WBC: 9.9 10*3/uL (ref 4.0–10.5)

## 2012-12-17 MED ORDER — WARFARIN SODIUM 10 MG PO TABS
10.0000 mg | ORAL_TABLET | Freq: Once | ORAL | Status: AC
  Administered 2012-12-17: 10 mg via ORAL
  Filled 2012-12-17: qty 1

## 2012-12-17 MED ORDER — FUROSEMIDE 40 MG PO TABS
40.0000 mg | ORAL_TABLET | Freq: Every day | ORAL | Status: DC
  Administered 2012-12-18 – 2012-12-19 (×2): 40 mg via ORAL
  Filled 2012-12-17 (×2): qty 1

## 2012-12-17 MED ORDER — POTASSIUM CHLORIDE CRYS ER 20 MEQ PO TBCR
20.0000 meq | EXTENDED_RELEASE_TABLET | Freq: Every day | ORAL | Status: DC
  Administered 2012-12-18 – 2012-12-19 (×2): 20 meq via ORAL
  Filled 2012-12-17 (×2): qty 1

## 2012-12-17 NOTE — Progress Notes (Signed)
Shift summary:  No acute events this shift.  VSS.  Patient has been ambulating with walker and one assist; activity tolerated well.  Plan of care reviewed; patient verbalized understanding.  Will continue to monitor.

## 2012-12-17 NOTE — Progress Notes (Signed)
Patient ID: Cheryl Zamora, female   DOB: 10/24/1923, 79 y.o.   MRN: [id number]  PCP: Jacky Kindle   Subjective:   79 y/o woman with CAD s/p CABG, mild dementia. Admitted with HF in setting of AF with RVR. S/p TEE/DC-CV on 10/15.  Maintaining NSR. Sitting in chair reading. No complaints. Eager to go home. INR 1.31 -> 1.67. PT recommending SNF or 24 hour supervision.   Objective:  Vital Signs in the last 24 hours: Temp:  [97.4 F (36.3 C)-98.6 F (37 C)] 98.4 F (36.9 C) (10/19 0754) Pulse Rate:  [71-80] 71 (10/18 1934) Resp:  [18] 18 (10/18 1715) BP: (135-167)/(45-73) 164/52 mmHg (10/19 0754) SpO2:  [93 %-98 %] 93 % (10/19 0754) Weight:  [67.722 kg (149 lb 4.8 oz)] 67.722 kg (149 lb 4.8 oz) (10/19 0500)  Intake/Output from previous day: 10/18 0701 - 10/19 0700 In: 1191 [P.O.:800; I.V.:391] Out: 1050 [Urine:1050] Intake/Output from this shift: Total I/O In: -  Out: 300 [Urine:300]  . aspirin EC  81 mg Oral Daily  . furosemide  40 mg Oral BID  . metoprolol tartrate  12.5 mg Oral BID  . potassium chloride  20 mEq Oral TID  . sodium chloride  3 mL Intravenous Q12H  . Warfarin - Pharmacist Dosing Inpatient   Does not apply q1800   . sodium chloride 10 mL/hr at 12/17/12 0600  . heparin 700 Units/hr (12/17/12 0600)    Physical Exam: General:  Elderly woman. Sitting in chair No resp difficulty HEENT: normal Neck: supple. no JVD. Carotids 2+ bilat; no bruits. No lymphadenopathy or thryomegaly appreciated. Cor: PMI nondisplaced. Regular rate & rhythm. No rubs, gallops or murmurs. Lungs: clear Abdomen: soft, nontender, nondistended. No hepatosplenomegaly. No bruits or masses. Good bowel sounds. Extremities: no cyanosis, clubbing, rash, edema Neuro: alert & orientedx3, cranial nerves grossly intact. moves all 4 extremities w/o difficulty. Affect pleasant  Lab Results:  Recent Labs  12/16/12 0354 12/17/12 0520  WBC 11.8* 9.9  HGB 14.7 14.7  PLT 271 262    Recent Labs  12/16/12 0354 12/17/12 0520  NA 141 136  K 4.6 4.9  CL 97 93*  CO2 35* 31  GLUCOSE 106* 107*  BUN 21 22  CREATININE 1.40* 1.39*    Imaging: Imaging results have been reviewed  Cardiac Studies: Telemetry shows NSR .  Assessment/Plan:  1. Atrial fibrillation with RVR. Holding NSR now on oral b-blocker. INR increasing. Pharmacy dosing warfarin. Given comorbidities may be reasonable to consider switch to NOAC in 4 weeks.  2. Acute systolic CHF.  EF 40% in setting of AF with RVR (EF previously normal) Improved continue lasix 40 bid K better today. 3. CAD s/p CABG.  4. Anticoagulation. On IV heparin. Transition to warfarin per pharmacy. 5. HTN. 6. Dementia 7. Physical deconditioning  Overall doing well. Agrees to short stay in rehab facility to help her build strength. Would like to consider place on [address] just [place]. I will place SW consult. Will be ready for d/c once INR >= 2.0. Will cut lasix back to 40 daily.  K increasing so will cut back to daily. Suspect EF will recover with maintenance of NSR. Keep weights and I/O even.    LOS: 5 days  Arvilla Meres 12/17/2012, 11:00 AM

## 2012-12-17 NOTE — Progress Notes (Signed)
ANTICOAGULATION CONSULT NOTE - Follow Up consult  Pharmacy Consult for heparin, warfarin Indication: atrial fibrillation  Allergies  Allergen Reactions  . Codeine Nausea And Vomiting    Patient Measurements: Height: 5\' 3"  (160 cm) Weight: 149 lb 4.8 oz (67.722 kg) IBW/kg (Calculated) : 52.4 Heparin Dosing Weight: 71kg  Vital Signs: Temp: 98.4 F (36.9 C) (10/19 0754) Temp src: Oral (10/19 0754) BP: 164/52 mmHg (10/19 0754)  Labs:  Recent Labs  12/15/12 0531  12/16/12 0354 12/16/12 1237 12/17/12 0520  HGB 13.4  --  14.7  --  14.7  HCT 40.5  --  44.2  --  45.2  PLT 247  --  271  --  262  LABPROT 15.7*  --  16.0*  --  19.2*  INR 1.28  --  1.31  --  1.67*  HEPARINUNFRC 0.87*  < > 0.76* 0.39 0.47  CREATININE 1.27*  --  1.40*  --  1.39*  < > = values in this interval not displayed.  Estimated Creatinine Clearance: 26.3 ml/min (by C-G formula based on Cr of 1.39).   Medical History: Past Medical History  Diagnosis Date  . Hyperlipidemia   . Hypertension   . Coronary artery disease 2005  . History of diastolic dysfunction     well compensated  . Back problem   . Neuropathy     Assessment: [name] an 79 y.o F who was admitted 12/12/2012   referred to the ED by PCP for further evaluation of atrial fibrillation with RVR. Pharmacy consulted to start heparin and 10/15 warfarin.  Events: NSR, feels better waiting for therapeutic INR on heparin.  Anticoagulation;Afib, :heparin warfarin, Heparin infusing at 700 units/hr h/h & platelets stable , no bleeding noted.  INR trending up, slowly.    Cardiovascular: cad, hf, htn, HLP: ASA,metop, furo, K 20 TID (K 4.6) BP 110-160s, HR 60-80s  Neurology: memory loss  Nephrology: CrCl 26 ml/min  PTA Medication Issues: Home medications not ordered:  Namenda  Goal of Therapy:  Heparin level 0.3-0.7 units/ml Monitor platelets by anticoagulation protocol: Yes INR 2-3   Plan:  1) Heparin @ 700 units/hr 2) Repeat  warfarin 10 mg x 1 tonight. 3) Daily heparin level, CBC, and INR  Thank you for allowing pharmacy to be a part of this patients care team.  Lovenia Kim Pharm.D., BCPS Clinical Pharmacist 12/17/2012 11:19 AM Pager: [phone] Phone: [phone]

## 2012-12-18 LAB — BASIC METABOLIC PANEL
BUN: 25 mg/dL — ABNORMAL HIGH (ref 6–23)
CO2: 28 mEq/L (ref 19–32)
Calcium: 9.9 mg/dL (ref 8.4–10.5)
Chloride: 94 mEq/L — ABNORMAL LOW (ref 96–112)
Creatinine, Ser: 1.34 mg/dL — ABNORMAL HIGH (ref 0.50–1.10)
Glucose, Bld: 104 mg/dL — ABNORMAL HIGH (ref 70–99)

## 2012-12-18 LAB — CBC
HCT: 43.3 % (ref 36.0–46.0)
Hemoglobin: 13.7 g/dL (ref 12.0–15.0)
MCH: 27.5 pg (ref 26.0–34.0)
MCHC: 31.6 g/dL (ref 30.0–36.0)
MCV: 86.8 fL (ref 78.0–100.0)
Platelets: 239 10*3/uL (ref 150–400)
RBC: 4.99 MIL/uL (ref 3.87–5.11)
RDW: 13.9 % (ref 11.5–15.5)
WBC: 9.3 10*3/uL (ref 4.0–10.5)

## 2012-12-18 LAB — HEPARIN LEVEL (UNFRACTIONATED): Heparin Unfractionated: 0.39 IU/mL (ref 0.30–0.70)

## 2012-12-18 LAB — PROTIME-INR
INR: 2.58 — ABNORMAL HIGH (ref 0.00–1.49)
Prothrombin Time: 26.8 seconds — ABNORMAL HIGH (ref 11.6–15.2)

## 2012-12-18 MED ORDER — WARFARIN SODIUM 2 MG PO TABS
2.0000 mg | ORAL_TABLET | Freq: Once | ORAL | Status: AC
  Administered 2012-12-18: 2 mg via ORAL
  Filled 2012-12-18: qty 1

## 2012-12-18 NOTE — Progress Notes (Signed)
     Subjective:  Feels well . No chest pain or dyspnea. Able to walk to BR using walker. Rhythm remains NSR.  Objective:  Vital Signs in the last 24 hours: Temp:  [97.4 F (36.3 C)-98.7 F (37.1 C)] 97.4 F (36.3 C) (10/20 0830) Pulse Rate:  [67-73] 73 (10/20 0830) BP: (99-145)/(34-79) 120/70 mmHg (10/20 0750) SpO2:  [93 %-94 %] 94 % (10/20 0830) Weight:  [150 lb 9.2 oz (68.3 kg)] 150 lb 9.2 oz (68.3 kg) (10/20 0350)  Intake/Output from previous day: 10/19 0701 - 10/20 0700 In: 974 [P.O.:600; I.V.:374] Out: 725 [Urine:725] Intake/Output from this shift: Total I/O In: 51 [I.V.:51] Out: -   . aspirin EC  81 mg Oral Daily  . furosemide  40 mg Oral Daily  . metoprolol tartrate  12.5 mg Oral BID  . potassium chloride  20 mEq Oral Daily  . sodium chloride  3 mL Intravenous Q12H  . Warfarin - Pharmacist Dosing Inpatient   Does not apply q1800   . sodium chloride 10 mL/hr (12/18/12 0800)  . heparin 700 Units/hr (12/18/12 0800)    Physical Exam: The patient appears to be in no distress.  Head and neck exam reveals that the pupils are equal and reactive.  The extraocular movements are full.  There is no scleral icterus.  Mouth and pharynx are benign.  No lymphadenopathy.  No carotid bruits.  The jugular venous pressure is normal.  Thyroid is not enlarged or tender.  Chest is clear to percussion and auscultation.  No rales or rhonchi.  Expansion of the chest is symmetrical.  Heart reveals no abnormal lift or heave.  First and second heart sounds are normal.  There is no murmur gallop rub or click.  The abdomen is soft and nontender.  Bowel sounds are normoactive.  There is no hepatosplenomegaly or mass.  There are no abdominal bruits.  Extremities reveal no phlebitis or edema.  Pedal pulses are good.  There is no cyanosis or clubbing.  Neurologic exam is normal strength and no lateralizing weakness.  No sensory deficits.  Integument reveals no rash  Lab Results:  Recent  Labs  12/17/12 0520 12/18/12 0504  WBC 9.9 9.3  HGB 14.7 13.7  PLT 262 239    Recent Labs  12/17/12 0520 12/18/12 0504  NA 136 135  K 4.9 4.2  CL 93* 94*  CO2 31 28  GLUCOSE 107* 104*  BUN 22 25*  CREATININE 1.39* 1.34*   No results found for this basename: TROPONINI, CK, MB,  in the last 72 hours Hepatic Function Panel No results found for this basename: PROT, ALBUMIN, AST, ALT, ALKPHOS, BILITOT, BILIDIR, IBILI,  in the last 72 hours No results found for this basename: CHOL,  in the last 72 hours No results found for this basename: PROTIME,  in the last 72 hours  Imaging: Imaging results have been reviewed  Cardiac Studies: Telemetry shows NSR . Assessment/Plan:  1. Atrial fibrillation with RVR. Holding NSR. 2. Acute systolic CHF.  EF 40% 3. CAD s/p CABG. Cardiac enzymes negative.  4. Anticoagulation.  5. HTN. 6. Hypokalemia, resolved.  Plan: Continue present meds. INR 2.58 so will DC IV heparin. Transfer to SNF for short-term stay when bed available. Family prefers [facility]    LOS: 6 days    Cassell Clement 12/18/2012, 8:41 AM

## 2012-12-18 NOTE — Clinical Social Work Note (Signed)
CSW has spoken with pt's [facility] case manager Marcelino Duster [phone]) and Marcelino Duster is on the lookout for pt's clinicals, which have been hard faxed. CSW working on paperwork for pt and will send clinicals to SNFs this afternoon. CSW will present bed offers to pt and pt's nephew Orlie Pollen, [phone]) when they become available, and will report to Marcelino Duster when pt & family has chosen a facility.    Maryclare Labrador, MSW, [facility] Clinical Social Worker [phone]

## 2012-12-18 NOTE — Progress Notes (Signed)
ANTICOAGULATION CONSULT NOTE - Follow Up consult  Pharmacy Consult for heparin, warfarin Indication: atrial fibrillation  Allergies  Allergen Reactions  . Codeine Nausea And Vomiting    Patient Measurements: Height: 5\' 3"  (160 cm) Weight: 150 lb 9.2 oz (68.3 kg) IBW/kg (Calculated) : 52.4 Heparin Dosing Weight: 71kg  Vital Signs: Temp: 97.4 F (36.3 C) (10/20 0830) Temp src: Oral (10/20 0830) BP: 120/70 mmHg (10/20 0750) Pulse Rate: 73 (10/20 0830)  Labs:  Recent Labs  12/16/12 0354 12/16/12 1237 12/17/12 0520 12/18/12 0504  HGB 14.7  --  14.7 13.7  HCT 44.2  --  45.2 43.3  PLT 271  --  262 239  LABPROT 16.0*  --  19.2* 26.8*  INR 1.31  --  1.67* 2.58*  HEPARINUNFRC 0.76* 0.39 0.47 0.39  CREATININE 1.40*  --  1.39* 1.34*    Estimated Creatinine Clearance: 27.5 ml/min (by C-G formula based on Cr of 1.34).   Medical History: Past Medical History  Diagnosis Date  . Hyperlipidemia   . Hypertension   . Coronary artery disease 2005  . History of diastolic dysfunction     well compensated  . Back problem   . Neuropathy     Assessment: [name] an 79 y.o F who was admitted 12/12/2012 referred to the ED by PCP for further evaluation of atrial fibrillation with RVR. Pharmacy consulted to start heparin and 10/15 warfarin.  Anticoagulation;Afib, :heparin and warfarin, Heparin infusing at 700 units/hr h/h & platelets stable , no bleeding noted.  INR trending up and was therapeutic this am, orders to stop IV heparin.   Appears vitamin k has now been depleted, diet still not optimal (50% meals) tough at this point to know how much warfarin she will need as outpatient. Would recommend low dose next two days to allow INR to stabilize in that I am afraid the 10mg  dose last night will linger and the INR will continue to go up. Expect her to need around 5mg  daily at discharge. Will need close follow up over next couple weeks with INR checks.  Goal of Therapy:  Heparin  level 0.3-0.7 units/ml Monitor platelets by anticoagulation protocol: Yes INR 2-3   Plan:  1) Heparin -D/C 2) warfarin 2mg  tonight  3) Daily heparin level, CBC, and INR  Thank you for allowing pharmacy to be a part of this patients care team.  Sheppard Coil PharmD., BCPS Clinical Pharmacist Phone [phone] 12/18/2012 9:12 AM

## 2012-12-18 NOTE — Clinical Social Work Note (Signed)
CSW received a voicemail from [facility] that pt is medically ready to go, and first choice is [facility]. CSW attempted to upload pt's clinicals to Provider Link for [facility] authorization, but a computer error prevented CSW from being able to do this. CSW hard-faxed clinicals to the appropriate [employer] case Production designer, theatre/television/film and is waiting for insurance authorization. CSW called [employer] case Production designer, theatre/television/film and left a voicemail requesting a call back on this pt. CSW also paged MD to request an OT consult, as this might be requested from [facility] for insurance authorization. CSW awaiting call back from MD. CSW completing FL2 and necessary paperwork for discharge and will facilitate this once CSW receives authorization from [facility] and verifies that pt has a bed at [facility].    Maryclare Labrador, MSW, [facility] Clinical Social Worker [phone]

## 2012-12-18 NOTE — Progress Notes (Signed)
CARDIAC REHAB PHASE I   PRE:  Rate/Rhythm: 89 SR  BP:  Supine:   Sitting: 127/82  Standing:    SaO2: 97%RA  MODE:  Ambulation: 350 ft   POST:  Rate/Rhythm: 91SR  BP:  Supine:   Sitting: 161/53  Standing:    SaO2: 95-96%RA [id number] Pt walked 350 ft on RA with rolling walker, gait belt use and asst x 1. Pt stayed close to walker and tolerated well. To recliner with call bell after walk.    Luetta Nutting, RN BSN  12/18/2012 9:51 AM

## 2012-12-18 NOTE — Clinical Social Work Note (Signed)
CSW spoke with pt's nephew [name], who was requesting an update from CSW. CSW explained that she has sent pt's clinicals to [facility] for insurance authorization for pt to be able to go to a SNF. CSW is currently working on paperwork to send to [facility], which is Phil's first and only choice facility. [name] says he will take pt home if she can't get a bed at [facility]. CSW also informed [name] that pt might not be able to discharge today if CSW does not get authorization from [facility] and if a bed is not available at [facility]. Phil understanding of this, saying tomorrow morning would be preferable for his schedule. CSW continues to work on discharge and is attempting to complete all required steps for discharge today.    Maryclare Labrador, MSW, [facility] Clinical Social Worker [phone]

## 2012-12-18 NOTE — Clinical Social Work Note (Signed)
[employer] is not approving pt for SNF placement--CSW called RNCM and asked what pt's options are given this response. RNCM explained that pt's nephew can either pay out of pocket for PT services at [facility] or RNCM can set up home health services for pt. CSW called pt's nephew and relayed these options. Nephew is choosing home health services. RNCM notified and nephew provided with [facility] phone number. CSW signing off on pt, as no further CSW needs identified.    Maryclare Labrador, MSW, [facility] Clinical Social Worker [phone]

## 2012-12-19 LAB — BASIC METABOLIC PANEL
BUN: 27 mg/dL — ABNORMAL HIGH (ref 6–23)
CO2: 26 mEq/L (ref 19–32)
Calcium: 9.6 mg/dL (ref 8.4–10.5)
Chloride: 98 mEq/L (ref 96–112)
Creatinine, Ser: 1.23 mg/dL — ABNORMAL HIGH (ref 0.50–1.10)
GFR calc Af Amer: 44 mL/min — ABNORMAL LOW (ref >90)
GFR calc non Af Amer: 38 mL/min — ABNORMAL LOW (ref >90)
Glucose, Bld: 110 mg/dL — ABNORMAL HIGH (ref 70–99)
Potassium: 4.1 mEq/L (ref 3.5–5.1)
Sodium: 137 mEq/L (ref 135–145)

## 2012-12-19 LAB — PROTIME-INR
INR: 2.83 — ABNORMAL HIGH (ref 0.00–1.49)
Prothrombin Time: 28.8 seconds — ABNORMAL HIGH (ref 11.6–15.2)

## 2012-12-19 MED ORDER — POTASSIUM CHLORIDE CRYS ER 20 MEQ PO TBCR: 20.0000 meq | EXTENDED_RELEASE_TABLET | Freq: Every day | ORAL | Status: DC

## 2012-12-19 MED ORDER — METOPROLOL SUCCINATE ER 25 MG PO TB24: 25.0000 mg | ORAL_TABLET | Freq: Every day | ORAL | Status: DC

## 2012-12-19 MED ORDER — WARFARIN SODIUM 5 MG PO TABS: 5.0000 mg | ORAL_TABLET | Freq: Every day | ORAL | Status: DC

## 2012-12-19 MED ORDER — FUROSEMIDE 40 MG PO TABS: 40.0000 mg | ORAL_TABLET | Freq: Every day | ORAL | Status: DC

## 2012-12-19 MED ORDER — WARFARIN SODIUM 2 MG PO TABS
2.0000 mg | ORAL_TABLET | Freq: Once | ORAL | Status: DC
  Filled 2012-12-19: qty 1

## 2012-12-19 NOTE — Progress Notes (Deleted)
Wrong note ................................................................................................... 

## 2012-12-19 NOTE — Progress Notes (Signed)
     Subjective:  Looking forward to going home.  Will request home health services.  Objective:  Vital Signs in the last 24 hours: Temp:  [97.9 F (36.6 C)-98.7 F (37.1 C)] 98.7 F (37.1 C) (10/21 0403) Pulse Rate:  [61-68] 68 (10/21 0802) BP: (97-134)/(40-73) 97/73 mmHg (10/21 0400) SpO2:  [95 %-98 %] 95 % (10/21 0403) Weight:  [154 lb 8.7 oz (70.1 kg)] 154 lb 8.7 oz (70.1 kg) (10/21 0500)  Intake/Output from previous day: 10/20 0701 - 10/21 0700 In: 921 [P.O.:870; I.V.:51] Out: -  Intake/Output from this shift:    . aspirin EC  81 mg Oral Daily  . furosemide  40 mg Oral Daily  . metoprolol tartrate  12.5 mg Oral BID  . potassium chloride  20 mEq Oral Daily  . sodium chloride  3 mL Intravenous Q12H  . warfarin  2 mg Oral ONCE-1800  . Warfarin - Pharmacist Dosing Inpatient   Does not apply q1800   . sodium chloride 10 mL/hr (12/18/12 0800)    Physical Exam: The patient appears to be in no distress.  Head and neck exam reveals that the pupils are equal and reactive.  The extraocular movements are full.  There is no scleral icterus.  Mouth and pharynx are benign.  No lymphadenopathy.  No carotid bruits.  The jugular venous pressure is normal.  Thyroid is not enlarged or tender.  Chest is clear to percussion and auscultation.  No rales or rhonchi.  Expansion of the chest is symmetrical.  Heart reveals no abnormal lift or heave.  First and second heart sounds are normal.  There is no murmur gallop rub or click.  The abdomen is soft and nontender.  Bowel sounds are normoactive.  There is no hepatosplenomegaly or mass.  There are no abdominal bruits.  Extremities reveal no phlebitis or edema.  Pedal pulses are good.  There is no cyanosis or clubbing.  Neurologic exam is normal strength and no lateralizing weakness.  No sensory deficits.  Integument reveals no rash  Lab Results:  Recent Labs  12/17/12 0520 12/18/12 0504  WBC 9.9 9.3  HGB 14.7 13.7  PLT 262 239      Recent Labs  12/18/12 0504 12/19/12 0500  NA 135 137  K 4.2 4.1  CL 94* 98  CO2 28 26  GLUCOSE 104* 110*  BUN 25* 27*  CREATININE 1.34* 1.23*   No results found for this basename: TROPONINI, CK, MB,  in the last 72 hours Hepatic Function Panel No results found for this basename: PROT, ALBUMIN, AST, ALT, ALKPHOS, BILITOT, BILIDIR, IBILI,  in the last 72 hours No results found for this basename: CHOL,  in the last 72 hours No results found for this basename: PROTIME,  in the last 72 hours  Imaging: Imaging results have been reviewed  Cardiac Studies: Telemetry shows NSR . Assessment/Plan:  1. Atrial fibrillation with RVR. Holding NSR. 2. Acute systolic CHF.  EF 40% 3. CAD s/p CABG. Cardiac enzymes negative.  4. Anticoagulation.  5. HTN. 6. Hypokalemia, resolved.  Plan: Continue present meds. INR 2.58 therapeutic. Discharge home today. Will need close outpatient followup for pro times.  Followup OV with Dr. [place] 1 week for transition of care.   LOS: 7 days    Cheryl Zamora 12/19/2012, 8:36 AM

## 2012-12-19 NOTE — Progress Notes (Signed)
ANTICOAGULATION CONSULT NOTE - Follow Up consult  Pharmacy Consult for warfarin Indication: atrial fibrillation  Allergies  Allergen Reactions  . Codeine Nausea And Vomiting    Patient Measurements: Height: 5\' 3"  (160 cm) Weight: 154 lb 8.7 oz (70.1 kg) IBW/kg (Calculated) : 52.4 Heparin Dosing Weight: 71kg  Vital Signs: Temp: 98.7 F (37.1 C) (10/21 0403) Temp src: Oral (10/21 0403) BP: 97/73 mmHg (10/21 0400) Pulse Rate: 68 (10/21 0802)  Labs:  Recent Labs  12/16/12 1237 12/17/12 0520 12/18/12 0504 12/19/12 0500  HGB  --  14.7 13.7  --   HCT  --  45.2 43.3  --   PLT  --  262 239  --   LABPROT  --  19.2* 26.8* 28.8*  INR  --  1.67* 2.58* 2.83*  HEPARINUNFRC 0.39 0.47 0.39  --   CREATININE  --  1.39* 1.34* 1.23*    Estimated Creatinine Clearance: 30.3 ml/min (by C-G formula based on Cr of 1.23).  Assessment: [name] an 79 y.o F who was admitted 12/12/2012 referred to the ED by PCP for further evaluation of atrial fibrillation with RVR. Pharmacy consulted to start heparin and 10/15 warfarin.  Anticoagulation:Afib- INR continues to trend up but slowing 2.5>>2.8  Appears vitamin k has now been depleted, diet appears to be improving, tough at this point to know how much warfarin she will need as outpatient. Would recommend low dose again today to allow INR to stabilize Expect her to need around 5mg  daily at discharge. Will need close follow up over next couple weeks with INR checks.  Goal of Therapy:  Monitor platelets by anticoagulation protocol: Yes INR 2-3   Plan:  1) warfarin 2mg  again tonight  2) Daily INR for now  Thank you for allowing pharmacy to be a part of this patients care team.  Sheppard Coil PharmD., BCPS Clinical Pharmacist Phone [phone] 12/19/2012 8:05 AM

## 2012-12-19 NOTE — Progress Notes (Signed)
[id number] 55 SB, 115/89   Came to take pt for walk. Pt stated need for bathroom. Took pt to bathroom and helped her wash hands. RN in to say pt for discharge. Assisted to recliner. Colon Flattery BSN

## 2012-12-19 NOTE — Progress Notes (Addendum)
Physical Therapy Treatment Patient Details Name: Cheryl Zamora MRN: [id number] DOB: [birth year] Today's Date: 12/19/2012 Time: 6578-4696 PT Time Calculation (min): 28 min  PT Assessment / Plan / Recommendation  History of Present Illness Cheryl Zamora is a 79 y.o. female with a history of CAD, no hx arrhythmia. She was noted to have an elevated HR on home BP machine yest, saw Dr. Timothy Lasso today and was found to be in rapid atrial fibrillation and heart failure. cardioversion 10/15   PT Comments   Pt continues to mobilize well however continues to require supervision to minguard assist with all mobility due to cognitive and memory deficits. Pt unable to recall day, month, year, home setup, room number or any education provided from prior visit or even this visit at end of session using teach back. Pt unable to problem solve or sequence how to ascend/descend steps without direct cueing and still unable to state how to call 911 in the event of an emergency. Nephew Cheryl Zamora present end of session and made aware of cognitive deficits and safety concerns and Cheryl Zamora states he has noticed pt progressively declining cognition. Pt end of session continues to state she should walk with a cane and drive despite repeated education for need to use RW at all times and not to leave the house unassisted. Pt is not safe to live alone. Noted CSW note that insurance denied SNF and discussed with nephew who will not be able to provide 24hr care and is agreeable to ALF which at this point is highly recommended due to pt lack of caregiver at home. All information relayed to RN. Will continue to follow acutely to maximize mobility, safety, balance and function.   Follow Up Recommendations  Supervision/Assistance - 24 hour;ALF;Other (comment), HHPT at ALF     Does the patient have the potential to tolerate intense rehabilitation     Barriers to Discharge        Equipment Recommendations  None recommended by PT     Recommendations for Other Services    Frequency     Progress towards PT Goals Progress towards PT goals: Progressing toward goals  Plan Current plan remains appropriate    Precautions / Restrictions Precautions Precautions: Fall   Pertinent Vitals/Pain 63-85 with gait No pain   Mobility  Bed Mobility Bed Mobility: Supine to Sit Supine to Sit: 6: Modified independent (Device/Increase time);With rails Transfers Sit to Stand: From toilet;5: Supervision;From bed Stand to Sit: To chair/3-in-1;5: Supervision;To toilet Details for Transfer Assistance: cueing for safety and hand placement Ambulation/Gait Ambulation/Gait Assistance: 4: Min guard Ambulation Distance (Feet): 500 Feet Assistive device: Rolling walker Ambulation/Gait Assistance Details: constant cueing for stepping into RW and directional cues Gait Pattern: Step-through pattern;Decreased stride length;Trunk flexed Gait velocity: decreased 17ft/24 sec=1.47ft/sec placing pt at increased fall risk and not safe for community ambulation Stairs: Yes Stairs Assistance: 4: Min guard (pt required sequential cueing to complete steps) Stair Management Technique: Sideways;One rail Left;Step to pattern Number of Stairs: 5    Exercises     PT Diagnosis:    PT Problem List:   PT Treatment Interventions:     PT Goals (current goals can now be found in the care plan section)    Visit Information  Last PT Received On: 12/19/12 Assistance Needed: +1 History of Present Illness: Cheryl Zamora is a 79 y.o. female with a history of CAD, no hx arrhythmia. She was noted to have an elevated HR on home BP machine yest, saw Dr.  Timothy Lasso today and was found to be in rapid atrial fibrillation and heart failure. cardioversion 10/15    Subjective Data      Cognition  Cognition Arousal/Alertness: Awake/alert Behavior During Therapy: Flat affect Overall Cognitive Status: Within Functional Limits for tasks assessed Area of Impairment:  Memory;Safety/judgement;Problem solving;Orientation Orientation Level: Time;Situation Memory: Decreased short-term memory;Decreased recall of precautions Safety/Judgement: Decreased awareness of safety;Decreased awareness of deficits Problem Solving: Slow processing;Requires verbal cues;Difficulty sequencing    Balance     End of Session PT - End of Session Activity Tolerance: Patient tolerated treatment well Patient left: in chair;with call bell/phone within reach;with family/visitor present;with nursing/sitter in room Nurse Communication: Mobility status   GP     Toney Sang [facility] 12/19/2012, 8:15 AM Delaney Meigs, PT [phone]

## 2012-12-19 NOTE — Discharge Summary (Signed)
CARDIOLOGY DISCHARGE SUMMARY   Patient ID: Cheryl Zamora MRN: [id number] DOB/AGE: [birth year] 77 y.o.  Admit date: 12/12/2012 Discharge date: 12/19/2012  Primary Discharge Diagnosis:     Acute diastolic CHF (congestive heart failure), NYHA class 3 Secondary Discharge Diagnosis:    Atrial fibrillation with rapid ventricular response   Hypertension   CAD (coronary artery disease) of artery bypass graft   Physical deconditioning   Anticoagulation  Procedures:  2-D echocardiogram, transesophageal echocardiogram  Hospital Course: Cheryl Zamora is a 79 y.o. female with a history of CAD. She went to see Dr. Timothy Lasso because of an elevated heart rate. She was found to be in congestive heart failure and was in atrial fibrillation with rapid ventricular response. She was sent to the hospital and admitted for further evaluation and treatment.  1. Acute diastolic CHF: She was diuresed with IV Lasix and her volume status improved. Her weight decreased was 7 pounds during her hospital stay. As her weight decreased, her volume status improved. By discharge, she was on oral Lasix and her oxygen saturation was 96% on room air.  2. Atrial fibrillation with rapid ventricular response: She was initially tried on IV Cardizem but did not tolerate it because of hypotension. A beta blocker was felt to be a better choice. She was started on metoprolol but her blood pressure limited dose increases. The TEE cardioversion was scheduled since her heart rate was not well controlled and her blood pressure limited medication changes. She had the TEE on 12/13/2012 and was cardioverted afterwards.  3. Anticoagulation: All options were reviewed. Because of her age and chronic kidney disease, heparin and transitioned to Coumadin was felt preferable. By discharge, her Coumadin level was therapeutic and she is to be followed closely as an outpatient. Because of the Coumadin, she is no longer on aspirin. She will have home  health and a home health RN is to do her INR for now.  4. physical deconditioning: She was somewhat weak on admission and there was concern for deconditioning because of her prolonged hospital stay as well. She was seen by physical therapy, and made some progress with them. However, she continued to have memory issues. She is not felt safe to live alone. 24-hour supervision is recommended. Her nephew is involved in her care and is looking for an assisted living facility. She does not qualify for skilled nursing. He was unable to locate an assisted living facility and she is currently otherwise rate for discharge. Therefore, she will have home health support and family support and on for now, with the plan for transfer to an assisted living facility from home once a facility is selected.  By 12/20/2012, her respiratory status had improved and she was maintaining sinus rhythm. She was evaluated by Dr. Patty Sermons and all data with you. He felt that no further inpatient workup was indicated and considered her stable for discharge, in improved condition, to follow up as an outpatient.  Labs:  Lab Results  Component Value Date   WBC 9.3 12/18/2012   HGB 13.7 12/18/2012   HCT 43.3 12/18/2012   MCV 86.8 12/18/2012   PLT 239 12/18/2012    Recent Labs Lab 12/12/12 1535  12/19/12 0500  NA 134*  < > 137  K 4.1  < > 4.1  CL 98  < > 98  CO2 17*  < > 26  BUN 13  < > 27*  CREATININE 1.12*  < > 1.23*  CALCIUM 9.5  < >  9.6  PROT 7.3  --   --   BILITOT 0.8  --   --   ALKPHOS 59  --   --   ALT 17  --   --   AST 29  --   --   GLUCOSE 134*  < > 110*  < > = values in this interval not displayed.  Pro B Natriuretic peptide (BNP)  Date/Time Value Range Status  12/12/2012  1:43 PM 2854.0* 0 - 450 pg/mL Final    Recent Labs  12/19/12 0500  INR 2.83*      Radiology: Dg Chest Port 1 View 12/12/2012   CLINICAL DATA:  New onset atrial fibrillation, slight shortness breath  EXAM: PORTABLE CHEST - 1 VIEW   COMPARISON:  Chest x-ray of 04/23/2003  FINDINGS: No active infiltrate or effusion is seen. Cardiomegaly is stable. Median sternotomy sutures are noted from prior CABG.  IMPRESSION: Stable cardiomegaly. No active lung disease.   Electronically Signed   By: Dwyane Dee M.D.   On: 12/12/2012 14:08   EKG: Atrial fibrillation, rapid ventricular response  Transesophageal Echo: 12/13/2012 Study Conclusions - Left ventricle: The cavity size was normal. Wall thickness was normal. The estimated ejection fraction was 50%. Diffuse hypokinesis. - Aortic valve: There was no stenosis. - Aorta: Normal caliber wtih grade IV plaque in the descending thoracic aorta. - Mitral valve: Thickened mitral valve with mild prolapse, primarily of P1 scallop of posterior leaflet. Mild regurgitation. - Left atrium: The atrium was moderately dilated. No evidence of thrombus in the atrial cavity or appendage. There was smoke in the appendage. - Right ventricle: The cavity size was mildly dilated. Systolic function was mildly reduced. - Right atrium: The atrium was moderately dilated. - Atrial septum: Lipomatous atrial septal hypertrophy. No defect or patent foramen ovale was identified. Impressions: - May proceed to cardioversion.  2-D echocardiogram, 12/12/2012 Study Conclusions - Left ventricle: The cavity size was normal. Wall thickness was normal. The estimated ejection fraction was 40%. Diffuse hypokinesis. - Aortic valve: There was no stenosis. - Mitral valve: Mildly calcified annulus. Mild regurgitation. - Left atrium: The atrium was moderately dilated. - Right ventricle: The cavity size was mildly dilated. Systolic function was moderately reduced. - Right atrium: The atrium was moderately dilated. - Tricuspid valve: Peak RV-RA gradient: 22mm Hg (S). - Pulmonary arteries: PA peak pressure: 37mm Hg (S). - Systemic veins: IVC measured 2.5 cm with minimal respirophasic variation, suggesting RA pressure  15 mmHg. Impressions: - Rapid atrial fibrillation makes LV systolic function estimation difficult.   FOLLOW UP PLANS AND APPOINTMENTS Allergies  Allergen Reactions  . Codeine Nausea And Vomiting     Medication List    STOP taking these medications       aspirin 81 MG tablet      TAKE these medications       furosemide 40 MG tablet  Commonly known as:  LASIX  Take 1 tablet (40 mg total) by mouth daily.     memantine 10 MG tablet  Commonly known as:  NAMENDA  Take 10 mg by mouth 2 (two) times daily.     metoprolol succinate 25 MG 24 hr tablet  Commonly known as:  TOPROL XL  Take 1 tablet (25 mg total) by mouth daily.     potassium chloride SA 20 MEQ tablet  Commonly known as:  K-DUR,KLOR-CON  Take 1 tablet (20 mEq total) by mouth daily.     warfarin 5 MG tablet  Commonly known as:  COUMADIN  Take 1 tablet (5 mg total) by mouth daily. Start with 1/2 tablet daily        Discharge Orders   Future Appointments Provider Department Dept Phone   12/26/2012 9:00 AM Rosalio Macadamia, NP [facility] [place] Office [phone]   Future Orders Complete By Expires   (HEART FAILURE PATIENTS) Call MD:  Anytime you have any of the following symptoms: 1) 3 pound weight gain in 24 hours or 5 pounds in 1 week 2) shortness of breath, with or without a dry hacking cough 3) swelling in the hands, feet or stomach 4) if you have to sleep on extra pillows at night in order to breathe.  As directed    Diet - low sodium heart healthy  As directed    Increase activity slowly  As directed      Follow-up Information   Follow up with Norma Fredrickson, NP On 12/26/2012. (at 9:00 am)    Specialty:  Nurse Practitioner   Contact information:   [address] [place] [address] [phone]       BRING ALL MEDICATIONS WITH YOU TO FOLLOW UP APPOINTMENTS  Time spent with patient to include physician time: 37 min Signed: Theodore Demark, PA-C 12/19/2012, 11:06  AM Co-Sign MD

## 2012-12-19 NOTE — Clinical Social Work Note (Signed)
CSW received a call from PT requesting CSW re-submit clinicals to insurance for ALF. CSW did this and contacted pt's [facility] case Haematologist [phone]). Marcelino Duster explained that insurance would not pay for ALF, but that she could re-evaluate pt's chart for SNF placement. Marcelino Duster provided the benefits phone number that pt's nephew can call to double-check to see if pt has ALF benefits. CSW has called pt's nephew Cheryl Zamora [phone]) and alerted him that though ALF is recommended, this likely would have to be paid for out of pocket. CSW left Cheryl Zamora a voicemail explaining that ALF likely would not be covered under insurance, providing him the phone number he can call to check this ([phone]) and reminding him that home health services are available for pt. CSW invited Cheryl Zamora to call.   Cheryl Zamora, MSW, [facility] Clinical Social Worker [phone]

## 2012-12-21 ENCOUNTER — Ambulatory Visit (INDEPENDENT_AMBULATORY_CARE_PROVIDER_SITE_OTHER): Payer: Medicare Other | Admitting: Cardiology

## 2012-12-21 DIAGNOSIS — Z7901 Long term (current) use of anticoagulants: Secondary | ICD-10-CM

## 2012-12-21 DIAGNOSIS — I4891 Unspecified atrial fibrillation: Secondary | ICD-10-CM

## 2012-12-21 LAB — POCT INR: INR: 2.9

## 2012-12-21 NOTE — Patient Instructions (Signed)

## 2012-12-25 ENCOUNTER — Ambulatory Visit (INDEPENDENT_AMBULATORY_CARE_PROVIDER_SITE_OTHER): Payer: Medicare Other | Admitting: Internal Medicine

## 2012-12-25 DIAGNOSIS — I4891 Unspecified atrial fibrillation: Secondary | ICD-10-CM

## 2012-12-25 DIAGNOSIS — Z7901 Long term (current) use of anticoagulants: Secondary | ICD-10-CM

## 2012-12-25 LAB — POCT INR: INR: 3.7

## 2012-12-26 ENCOUNTER — Encounter: Payer: Medicare Other | Admitting: Nurse Practitioner

## 2013-01-01 ENCOUNTER — Ambulatory Visit (INDEPENDENT_AMBULATORY_CARE_PROVIDER_SITE_OTHER): Payer: Medicare Other | Admitting: Cardiology

## 2013-01-01 DIAGNOSIS — I4891 Unspecified atrial fibrillation: Secondary | ICD-10-CM

## 2013-01-01 DIAGNOSIS — Z7901 Long term (current) use of anticoagulants: Secondary | ICD-10-CM

## 2013-01-01 LAB — POCT INR: INR: 3.1

## 2013-01-08 ENCOUNTER — Ambulatory Visit (INDEPENDENT_AMBULATORY_CARE_PROVIDER_SITE_OTHER): Payer: Medicare Other | Admitting: Cardiovascular Disease

## 2013-01-08 ENCOUNTER — Encounter: Payer: Self-pay | Admitting: Pharmacist

## 2013-01-08 DIAGNOSIS — Z7901 Long term (current) use of anticoagulants: Secondary | ICD-10-CM

## 2013-01-08 DIAGNOSIS — I4891 Unspecified atrial fibrillation: Secondary | ICD-10-CM

## 2013-01-08 LAB — PROTIME-INR: INR: 3.5 — AB (ref 0.9–1.1)

## 2013-01-09 ENCOUNTER — Encounter: Payer: Self-pay | Admitting: Nurse Practitioner

## 2013-01-09 ENCOUNTER — Ambulatory Visit (INDEPENDENT_AMBULATORY_CARE_PROVIDER_SITE_OTHER): Payer: Medicare Other | Admitting: Nurse Practitioner

## 2013-01-09 VITALS — BP 124/70 | HR 65 | Ht 63.0 in | Wt 153.1 lb

## 2013-01-09 DIAGNOSIS — I4891 Unspecified atrial fibrillation: Secondary | ICD-10-CM

## 2013-01-09 LAB — CBC WITH DIFFERENTIAL/PLATELET
Basophils Absolute: 0.1 10*3/uL (ref 0.0–0.1)
Basophils Relative: 0.8 % (ref 0.0–3.0)
Eosinophils Absolute: 0.2 10*3/uL (ref 0.0–0.7)
Eosinophils Relative: 2 % (ref 0.0–5.0)
HCT: 43.6 % (ref 36.0–46.0)
Hemoglobin: 14.6 g/dL (ref 12.0–15.0)
Lymphocytes Relative: 28.4 % (ref 12.0–46.0)
Lymphs Abs: 2.6 10*3/uL (ref 0.7–4.0)
MCHC: 33.4 g/dL (ref 30.0–36.0)
MCV: 84.5 fl (ref 78.0–100.0)
Monocytes Absolute: 0.6 10*3/uL (ref 0.1–1.0)
Monocytes Relative: 6.7 % (ref 3.0–12.0)
Neutro Abs: 5.7 10*3/uL (ref 1.4–7.7)
Neutrophils Relative %: 62.1 % (ref 43.0–77.0)
Platelets: 289 10*3/uL (ref 150.0–400.0)
RBC: 5.17 Mil/uL — ABNORMAL HIGH (ref 3.87–5.11)
RDW: 13.9 % (ref 11.5–14.6)
WBC: 9.2 10*3/uL (ref 4.5–10.5)

## 2013-01-09 LAB — BASIC METABOLIC PANEL
BUN: 14 mg/dL (ref 6–23)
CO2: 31 mEq/L (ref 19–32)
Calcium: 9.4 mg/dL (ref 8.4–10.5)
Chloride: 98 mEq/L (ref 96–112)
Creatinine, Ser: 1.1 mg/dL (ref 0.4–1.2)
GFR: 48.36 mL/min — ABNORMAL LOW (ref >60.00)
Glucose, Bld: 97 mg/dL (ref 70–99)
Potassium: 3.3 mEq/L — ABNORMAL LOW (ref 3.5–5.1)
Sodium: 136 mEq/L (ref 135–145)

## 2013-01-09 NOTE — Progress Notes (Signed)
Cheryl Zamora Date of Birth: [birth year] Medical Record #[id number]  History of Present Illness: Cheryl Zamora is seen back today for a post hospital visit - was to be a TOC but has been out of the hospital since October 21st. Seen for Dr. Swazil[place]. She has CAD, diastolic heart failure, HTN and overall deconditioned state with memory disorder.   Most recently hospitalized with after being at Dr. Ferd Hibbs with an elevated heart rate - felt to be in heart failure with atrial fib with RVR. She was diuresed down 7 pounds. Was tried on diltiazem for rate control but got hypotensive, only able to tolerate small doses of metoprolol. Subsequently had TEE cardioversion on 12/13/2012. She was also place on anticoagulation with coumadin given her age and CKD. She did not qualify for SNF and thus had home health ordered.   Comes back today. Here with her nephew - he is her closest next of kin Cheryl Zamora). Doing ok for the most part. Home health checking INRs. We are adjusting. Previously was missing lots of her medicines but has done better since her admission. Cheryl Zamora. No falls. Using her walker. Not short of breath. No chest pain. No swelling.    Current Outpatient Prescriptions  Medication Sig Dispense Refill  . furosemide (LASIX) 40 MG tablet Take 1 tablet (40 mg total) by mouth daily.  30 tablet  11  . memantine (NAMENDA) 10 MG tablet Take 10 mg by mouth 2 (two) times daily.      . metoprolol succinate (TOPROL XL) 25 MG 24 hr tablet Take 1 tablet (25 mg total) by mouth daily.  30 tablet  11  . potassium chloride (K-DUR,KLOR-CON) 20 MEQ tablet Take 1 tablet (20 mEq total) by mouth daily.  30 tablet  11  . warfarin (COUMADIN) 5 MG tablet Take 1 tablet (5 mg total) by mouth daily. Start with 1/2 tablet daily  60 tablet  11   No current facility-administered medications for this visit.    Allergies  Allergen Reactions  . Codeine Nausea And Vomiting    Past Medical History    Diagnosis Date  . Hyperlipidemia   . Hypertension   . Coronary artery disease 2005    s/p CABG  . History of diastolic dysfunction     well compensated  . Back problem   . Neuropathy     Past Surgical History  Procedure Laterality Date  . Cardiac catheterization  2005    LMain 60, LAD 90, D1 30, CFX 95->T, RCA OK  . Coronary artery bypass graft  2005    x 4; LIMA-LAD, SVG-D1, SVG-OM1-OM2  . Other surgical history      hysterectomy  . Other surgical history      tonsilectomy  . Back surgery      "neck sugery in 60's"  . Tee without cardioversion N/A 12/13/2012    Procedure: TRANSESOPHAGEAL ECHOCARDIOGRAM (TEE);  Surgeon: Laurey Morale, MD;  Location: [facility] ENDOSCOPY;  Service: Cardiovascular;  Laterality: N/A;  . Cardioversion N/A 12/13/2012    Procedure: CARDIOVERSION;  Surgeon: Laurey Morale, MD;  Location: [facility] ENDOSCOPY;  Service: Cardiovascular;  Laterality: N/A;    History  Smoking status  . Former Smoker  . Quit date: 03/02/2003  Smokeless tobacco  . Not on file    History  Alcohol Use No    Family History  Problem Relation Age of Onset  . Heart attack Mother   . Heart attack Father   .  Heart attack Brother   . Heart attack Brother   . Heart attack Brother   . Heart attack Brother     Review of Systems: The review of systems is per the HPI.  All other systems were reviewed and are negative.  Physical Exam: BP 124/70  Pulse 65  Ht 5\' 3"  (1.6 m)  Wt 153 lb 1.9 oz (69.455 kg)  BMI 27.13 kg/m2 [name] very pleasant and in no acute distress. Skin is warm and dry. Color is normal.  HEENT is unremarkable. Normocephalic/atraumatic. PERRL. Sclera are nonicteric. Neck is supple. No masses. No JVD. Lungs are clear. Cardiac exam shows a regular rate and rhythm. Abdomen is soft. Extremities are without edema. Gait and ROM are intact. No gross neurologic deficits noted.  LABORATORY DATA: EKG today shows sinus rhythm PACs and PVC  Lab Results  Component  Value Date   WBC 9.3 12/18/2012   HGB 13.7 12/18/2012   HCT 43.3 12/18/2012   PLT 239 12/18/2012   GLUCOSE 110* 12/19/2012   ALT 17 12/12/2012   AST 29 12/12/2012   NA 137 12/19/2012   K 4.1 12/19/2012   CL 98 12/19/2012   CREATININE 1.23* 12/19/2012   BUN 27* 12/19/2012   CO2 26 12/19/2012   TSH 1.581 12/12/2012   INR 4.3 01/08/2013   TEE Study Conclusions  - Left ventricle: The cavity size was normal. Wall thickness was normal. The estimated ejection fraction was 50%. Diffuse hypokinesis. - Aortic valve: There was no stenosis. - Aorta: Normal caliber wtih grade IV plaque in the descending thoracic aorta. - Mitral valve: Thickened mitral valve with mild prolapse, primarily of P1 scallop of posterior leaflet. Mild regurgitation. - Left atrium: The atrium was moderately dilated. No evidence of thrombus in the atrial cavity or appendage. There was smoke in the appendage. - Right ventricle: The cavity size was mildly dilated. Systolic function was mildly reduced. - Right atrium: The atrium was moderately dilated. - Atrial septum: Lipomatous atrial septal hypertrophy. No defect or patent foramen ovale was identified.   Assessment / Plan: 1. PAF - s/p TEE cardioversion - holding in sinus. Favor continued medical therapy.   2. Diastolic HF - looks compensated.   3. Advanced age   43. Chronic anticoagulation - recheck her labs today.   Nephew has support lined up the the patient. OT/PT visiting as well. We will see her back in 3 months. Check labs today.   [name] agreeable to this plan and will call if any problems develop in the interim.   Rosalio Macadamia, RN, ANP-C [facility] [address] [place], [place]  [address]

## 2013-01-09 NOTE — Patient Instructions (Signed)
Stay on your current medicines  We will check labs today  See Dr. Swaziland in 3 months  Call the [facility] office at [phone] if you have any questions, problems or concerns.

## 2013-01-10 ENCOUNTER — Telehealth: Payer: Self-pay | Admitting: Nurse Practitioner

## 2013-01-10 NOTE — Telephone Encounter (Signed)
Follow Up:  Pt's nephew states he is returning Danielle's call

## 2013-01-12 NOTE — Telephone Encounter (Signed)
S/w pt's nephew wanted to make sure was dissolving potassium instead of crushing, Lawson Fiscal stated if you crush potassium it deactivates medication and also if nephew cannot give pt extra potassium try to get pt to eat a banana or drink oj.  Nephew stated verbal understanding

## 2013-01-15 ENCOUNTER — Ambulatory Visit (INDEPENDENT_AMBULATORY_CARE_PROVIDER_SITE_OTHER): Payer: Medicare Other | Admitting: Pharmacist

## 2013-01-15 DIAGNOSIS — Z7901 Long term (current) use of anticoagulants: Secondary | ICD-10-CM

## 2013-01-15 DIAGNOSIS — I4891 Unspecified atrial fibrillation: Secondary | ICD-10-CM

## 2013-01-24 ENCOUNTER — Ambulatory Visit (INDEPENDENT_AMBULATORY_CARE_PROVIDER_SITE_OTHER): Payer: Medicare Other | Admitting: *Deleted

## 2013-01-24 DIAGNOSIS — Z7901 Long term (current) use of anticoagulants: Secondary | ICD-10-CM

## 2013-01-24 DIAGNOSIS — I4891 Unspecified atrial fibrillation: Secondary | ICD-10-CM

## 2013-01-24 LAB — POCT INR: INR: 2.5

## 2013-02-07 ENCOUNTER — Ambulatory Visit (INDEPENDENT_AMBULATORY_CARE_PROVIDER_SITE_OTHER): Payer: Medicare Other | Admitting: *Deleted

## 2013-02-07 DIAGNOSIS — Z7901 Long term (current) use of anticoagulants: Secondary | ICD-10-CM

## 2013-02-07 DIAGNOSIS — I4891 Unspecified atrial fibrillation: Secondary | ICD-10-CM

## 2013-02-07 LAB — POCT INR: INR: 2.5

## 2013-03-02 ENCOUNTER — Ambulatory Visit (INDEPENDENT_AMBULATORY_CARE_PROVIDER_SITE_OTHER): Payer: Medicare Other | Admitting: *Deleted

## 2013-03-02 DIAGNOSIS — I4891 Unspecified atrial fibrillation: Secondary | ICD-10-CM

## 2013-03-02 DIAGNOSIS — Z7901 Long term (current) use of anticoagulants: Secondary | ICD-10-CM

## 2013-03-02 LAB — POCT INR: INR: 2.4

## 2013-03-30 ENCOUNTER — Ambulatory Visit (INDEPENDENT_AMBULATORY_CARE_PROVIDER_SITE_OTHER): Payer: Medicare Other | Admitting: Pharmacist

## 2013-03-30 DIAGNOSIS — Z7901 Long term (current) use of anticoagulants: Secondary | ICD-10-CM

## 2013-03-30 DIAGNOSIS — I4891 Unspecified atrial fibrillation: Secondary | ICD-10-CM

## 2013-03-30 LAB — POCT INR: INR: 2.7

## 2013-04-11 ENCOUNTER — Ambulatory Visit: Payer: Medicare Other | Admitting: Cardiology

## 2013-05-29 ENCOUNTER — Ambulatory Visit (INDEPENDENT_AMBULATORY_CARE_PROVIDER_SITE_OTHER): Payer: Medicare Other | Admitting: Cardiology

## 2013-05-29 ENCOUNTER — Encounter: Payer: Self-pay | Admitting: Cardiology

## 2013-05-29 ENCOUNTER — Ambulatory Visit (INDEPENDENT_AMBULATORY_CARE_PROVIDER_SITE_OTHER): Payer: Medicare Other | Admitting: Pharmacist Clinician (PhC)/ Clinical Pharmacy Specialist

## 2013-05-29 VITALS — BP 136/60 | HR 67 | Ht 63.0 in | Wt 161.0 lb

## 2013-05-29 DIAGNOSIS — Z7901 Long term (current) use of anticoagulants: Secondary | ICD-10-CM

## 2013-05-29 DIAGNOSIS — I5032 Chronic diastolic (congestive) heart failure: Secondary | ICD-10-CM

## 2013-05-29 DIAGNOSIS — I4891 Unspecified atrial fibrillation: Secondary | ICD-10-CM

## 2013-05-29 DIAGNOSIS — I25810 Atherosclerosis of coronary artery bypass graft(s) without angina pectoris: Secondary | ICD-10-CM

## 2013-05-29 LAB — CBC WITH DIFFERENTIAL/PLATELET
Basophils Absolute: 0.1 10*3/uL (ref 0.0–0.1)
Basophils Relative: 0.7 % (ref 0.0–3.0)
EOS PCT: 1.4 % (ref 0.0–5.0)
Eosinophils Absolute: 0.1 10*3/uL (ref 0.0–0.7)
HEMATOCRIT: 41 % (ref 36.0–46.0)
Hemoglobin: 13.6 g/dL (ref 12.0–15.0)
LYMPHS ABS: 2.2 10*3/uL (ref 0.7–4.0)
Lymphocytes Relative: 22.3 % (ref 12.0–46.0)
MCHC: 33.1 g/dL (ref 30.0–36.0)
MCV: 87.1 fl (ref 78.0–100.0)
MONOS PCT: 5.4 % (ref 3.0–12.0)
Monocytes Absolute: 0.5 10*3/uL (ref 0.1–1.0)
Neutro Abs: 7.1 10*3/uL (ref 1.4–7.7)
Neutrophils Relative %: 70.2 % (ref 43.0–77.0)
PLATELETS: 258 10*3/uL (ref 150.0–400.0)
RBC: 4.71 Mil/uL (ref 3.87–5.11)
RDW: 14.5 % (ref 11.5–14.6)
WBC: 10.1 10*3/uL (ref 4.5–10.5)

## 2013-05-29 LAB — BASIC METABOLIC PANEL
BUN: 15 mg/dL (ref 6–23)
CO2: 29 mEq/L (ref 19–32)
Calcium: 9.5 mg/dL (ref 8.4–10.5)
Chloride: 98 mEq/L (ref 96–112)
Creatinine, Ser: 1.4 mg/dL — ABNORMAL HIGH (ref 0.4–1.2)
GFR: 38.69 mL/min — AB (ref >60.00)
Glucose, Bld: 126 mg/dL — ABNORMAL HIGH (ref 70–99)
POTASSIUM: 3.6 meq/L (ref 3.5–5.1)
SODIUM: 137 meq/L (ref 135–145)

## 2013-05-29 LAB — POCT INR: INR: 2.6

## 2013-05-29 NOTE — Addendum Note (Signed)
Addended by: Eulis Foster on: 05/29/2013 01:35 PM   Modules accepted: Orders

## 2013-05-29 NOTE — Patient Instructions (Signed)
Continue your current therapy  I will see you in 6 months.   

## 2013-05-29 NOTE — Progress Notes (Signed)
[name] Date of Birth: [birth year] Medical Record #[id number]  History of Present Illness: [name] is seen back today for a follow up visit. She has CAD, diastolic heart failure, HTN and overall deconditioned state with memory disorder.   She was hospitalized in Oct. 2014 with  heart failure and atrial fib with RVR. She was diuresed down 7 pounds. Was tried on diltiazem for rate control but got hypotensive, only able to tolerate small doses of metoprolol. Subsequently had TEE cardioversion on 12/13/2012. She was also place on anticoagulation with coumadin given her age and CKD.   Today she is seen with her [employer]. States she is doing well. No chest pain or SOB. Denies palpitations. INR has been therapeutic since October. No edema. Gets around with a walker.   Current Outpatient Prescriptions  Medication Sig Dispense Refill  . furosemide (LASIX) 40 MG tablet Take 1 tablet (40 mg total) by mouth daily.  30 tablet  11  . memantine (NAMENDA) 10 MG tablet Take 10 mg by mouth 2 (two) times daily.      . metoprolol succinate (TOPROL XL) 25 MG 24 hr tablet Take 1 tablet (25 mg total) by mouth daily.  30 tablet  11  . potassium chloride (K-DUR,KLOR-CON) 20 MEQ tablet Take 1 tablet (20 mEq total) by mouth daily.  30 tablet  11  . warfarin (COUMADIN) 5 MG tablet Take 1 tablet (5 mg total) by mouth daily. Start with 1/2 tablet daily  60 tablet  11   No current facility-administered medications for this visit.    Allergies  Allergen Reactions  . Codeine Nausea And Vomiting    Past Medical History  Diagnosis Date  . Hyperlipidemia   . Hypertension   . Coronary artery disease 2005    s/p CABG  . History of diastolic dysfunction     well compensated  . Back problem   . Neuropathy     Past Surgical History  Procedure Laterality Date  . Cardiac catheterization  2005    LMain 60, LAD 90, D1 30, CFX 95->T, RCA OK  . Coronary artery bypass graft  2005    x 4; LIMA-LAD, SVG-D1,  SVG-OM1-OM2  . Other surgical history      hysterectomy  . Other surgical history      tonsilectomy  . Back surgery      "neck sugery in 60's"  . Tee without cardioversion N/A 12/13/2012    Procedure: TRANSESOPHAGEAL ECHOCARDIOGRAM (TEE);  Surgeon: Larey Dresser, MD;  Location: [facility];  Service: Cardiovascular;  Laterality: N/A;  . Cardioversion N/A 12/13/2012    Procedure: CARDIOVERSION;  Surgeon: Larey Dresser, MD;  Location: [facility] ENDOSCOPY;  Service: Cardiovascular;  Laterality: N/A;    History  Smoking status  . Former Smoker  . Quit date: 03/02/2003  Smokeless tobacco  . Not on file    History  Alcohol Use No    Family History  Problem Relation Age of Onset  . Heart attack Mother   . Heart attack Father   . Heart attack Brother   . Heart attack Brother   . Heart attack Brother   . Heart attack Brother     Review of Systems: The review of systems is per the HPI.  All other systems were reviewed and are negative.  Physical Exam: BP 136/60  Pulse 67  Ht 5\' 3"  (1.6 m)  Wt 161 lb (73.029 kg)  BMI 28.53 kg/m2 [name] very pleasant and in  no acute distress. Skin is warm and dry. Color is normal.  HEENT is unremarkable. Normocephalic/atraumatic. PERRL. Sclera are nonicteric. Neck is supple. No masses. No JVD. Lungs are clear. Cardiac exam shows a regular rate and rhythm. Normal S1-2. No gallop or murmur. PMI is normal. Abdomen is soft. Extremities are without edema. Gait and ROM are intact. No gross neurologic deficits noted.  LABORATORY DATA:   Lab Results  Component Value Date   WBC 9.2 01/09/2013   HGB 14.6 01/09/2013   HCT 43.6 01/09/2013   PLT 289.0 01/09/2013   GLUCOSE 97 01/09/2013   ALT 17 12/12/2012   AST 29 12/12/2012   NA 136 01/09/2013   K 3.3* 01/09/2013   CL 98 01/09/2013   CREATININE 1.1 01/09/2013   BUN 14 01/09/2013   CO2 31 01/09/2013   TSH 1.581 12/12/2012   INR 2.6 05/29/2013   INR is 2.6 today.   Assessment / Plan: 1.  PAF - s/p TEE cardioversion in October - maintaining sinus rhythm. Favor continued medical therapy.   2. Diastolic HF - well compensated.   3. Advanced age/ memory loss.  4. Chronic anticoagulation - therapeutic  Will check BMET and CBC today. Continue therapy. Follow up in 6 months.

## 2013-11-12 ENCOUNTER — Emergency Department (HOSPITAL_COMMUNITY): Payer: Medicare Other

## 2013-11-12 ENCOUNTER — Encounter (HOSPITAL_COMMUNITY): Payer: Self-pay | Admitting: *Deleted

## 2013-11-12 ENCOUNTER — Telehealth: Payer: Self-pay | Admitting: Cardiology

## 2013-11-12 ENCOUNTER — Inpatient Hospital Stay (HOSPITAL_COMMUNITY)
Admission: EM | Admit: 2013-11-12 | Discharge: 2013-11-15 | DRG: 308 | Disposition: A | Discharge status: Home Health | Payer: Medicare Other | Attending: Cardiology | Admitting: Cardiology

## 2013-11-12 DIAGNOSIS — R9431 Abnormal electrocardiogram [ECG] [EKG]: Secondary | ICD-10-CM

## 2013-11-12 DIAGNOSIS — Z7901 Long term (current) use of anticoagulants: Secondary | ICD-10-CM

## 2013-11-12 DIAGNOSIS — R0609 Other forms of dyspnea: Secondary | ICD-10-CM | POA: Diagnosis not present

## 2013-11-12 DIAGNOSIS — N179 Acute kidney failure, unspecified: Secondary | ICD-10-CM | POA: Diagnosis present

## 2013-11-12 DIAGNOSIS — E785 Hyperlipidemia, unspecified: Secondary | ICD-10-CM | POA: Diagnosis present

## 2013-11-12 DIAGNOSIS — I509 Heart failure, unspecified: Secondary | ICD-10-CM

## 2013-11-12 DIAGNOSIS — Z87891 Personal history of nicotine dependence: Secondary | ICD-10-CM

## 2013-11-12 DIAGNOSIS — I5033 Acute on chronic diastolic (congestive) heart failure: Secondary | ICD-10-CM | POA: Diagnosis present

## 2013-11-12 DIAGNOSIS — I2510 Atherosclerotic heart disease of native coronary artery without angina pectoris: Secondary | ICD-10-CM | POA: Diagnosis present

## 2013-11-12 DIAGNOSIS — G589 Mononeuropathy, unspecified: Secondary | ICD-10-CM | POA: Diagnosis present

## 2013-11-12 DIAGNOSIS — I5032 Chronic diastolic (congestive) heart failure: Secondary | ICD-10-CM

## 2013-11-12 DIAGNOSIS — I4891 Unspecified atrial fibrillation: Secondary | ICD-10-CM | POA: Diagnosis present

## 2013-11-12 DIAGNOSIS — I480 Paroxysmal atrial fibrillation: Secondary | ICD-10-CM | POA: Diagnosis present

## 2013-11-12 DIAGNOSIS — R0989 Other specified symptoms and signs involving the circulatory and respiratory systems: Secondary | ICD-10-CM | POA: Diagnosis present

## 2013-11-12 DIAGNOSIS — I25810 Atherosclerosis of coronary artery bypass graft(s) without angina pectoris: Secondary | ICD-10-CM

## 2013-11-12 DIAGNOSIS — Z951 Presence of aortocoronary bypass graft: Secondary | ICD-10-CM | POA: Diagnosis not present

## 2013-11-12 DIAGNOSIS — R319 Hematuria, unspecified: Secondary | ICD-10-CM | POA: Diagnosis present

## 2013-11-12 HISTORY — DX: Unspecified atrial fibrillation: I48.91

## 2013-11-12 LAB — CBC
HEMATOCRIT: 42.6 % (ref 36.0–46.0)
HEMOGLOBIN: 13.7 g/dL (ref 12.0–15.0)
MCH: 27 pg (ref 26.0–34.0)
MCHC: 32.2 g/dL (ref 30.0–36.0)
MCV: 83.9 fL (ref 78.0–100.0)
Platelets: 141 10*3/uL — ABNORMAL LOW (ref 150–400)
RBC: 5.08 MIL/uL (ref 3.87–5.11)
RDW: 14.6 % (ref 11.5–15.5)
WBC: 9.3 10*3/uL (ref 4.0–10.5)

## 2013-11-12 LAB — I-STAT TROPONIN, ED: Troponin i, poc: 0.03 ng/mL (ref 0.00–0.08)

## 2013-11-12 LAB — COMPREHENSIVE METABOLIC PANEL
ALT: 17 U/L (ref 0–35)
ANION GAP: 18 — AB (ref 5–15)
AST: 23 U/L (ref 0–37)
Albumin: 4.1 g/dL (ref 3.5–5.2)
Alkaline Phosphatase: 74 U/L (ref 39–117)
BILIRUBIN TOTAL: 0.9 mg/dL (ref 0.3–1.2)
BUN: 14 mg/dL (ref 6–23)
CHLORIDE: 99 meq/L (ref 96–112)
CO2: 22 meq/L (ref 19–32)
Calcium: 9.6 mg/dL (ref 8.4–10.5)
Creatinine, Ser: 1.21 mg/dL — ABNORMAL HIGH (ref 0.50–1.10)
GFR calc Af Amer: 45 mL/min — ABNORMAL LOW (ref >90)
GFR, EST NON AFRICAN AMERICAN: 39 mL/min — AB (ref >90)
Glucose, Bld: 106 mg/dL — ABNORMAL HIGH (ref 70–99)
Potassium: 4.1 mEq/L (ref 3.7–5.3)
Sodium: 139 mEq/L (ref 137–147)
Total Protein: 7.6 g/dL (ref 6.0–8.3)

## 2013-11-12 LAB — URINALYSIS, ROUTINE W REFLEX MICROSCOPIC
BILIRUBIN URINE: NEGATIVE
Glucose, UA: NEGATIVE mg/dL
KETONES UR: NEGATIVE mg/dL
Leukocytes, UA: NEGATIVE
Nitrite: NEGATIVE
PH: 6 (ref 5.0–8.0)
Protein, ur: NEGATIVE mg/dL
SPECIFIC GRAVITY, URINE: 1.007 (ref 1.005–1.030)
Urobilinogen, UA: 1 mg/dL (ref 0.0–1.0)

## 2013-11-12 LAB — URINE MICROSCOPIC-ADD ON

## 2013-11-12 LAB — PROTIME-INR
INR: 2.5 — ABNORMAL HIGH (ref 0.00–1.49)
PROTHROMBIN TIME: 27 s — AB (ref 11.6–15.2)

## 2013-11-12 LAB — PRO B NATRIURETIC PEPTIDE: Pro B Natriuretic peptide (BNP): 3731 pg/mL — ABNORMAL HIGH (ref 0–450)

## 2013-11-12 LAB — TROPONIN I: Troponin I: <0.3 ng/mL (ref <0.30)

## 2013-11-12 LAB — TSH: TSH: 3.85 u[IU]/mL (ref 0.350–4.500)

## 2013-11-12 MED ORDER — ALPRAZOLAM 0.25 MG PO TABS
0.2500 mg | ORAL_TABLET | Freq: Two times a day (BID) | ORAL | Status: DC | PRN
  Filled 2013-11-12: qty 1

## 2013-11-12 MED ORDER — SODIUM CHLORIDE 0.9 % IV SOLN
INTRAVENOUS | Status: DC
  Administered 2013-11-12: 21:00:00 via INTRAVENOUS

## 2013-11-12 MED ORDER — NITROGLYCERIN 0.4 MG SL SUBL: 0.4000 mg | SUBLINGUAL_TABLET | SUBLINGUAL | Status: DC | PRN

## 2013-11-12 MED ORDER — ZOLPIDEM TARTRATE 5 MG PO TABS: 5.0000 mg | ORAL_TABLET | Freq: Every evening | ORAL | Status: DC | PRN

## 2013-11-12 MED ORDER — AMIODARONE HCL IN DEXTROSE 360-4.14 MG/200ML-% IV SOLN
30.0000 mg/h | INTRAVENOUS | Status: DC
  Administered 2013-11-13 – 2013-11-14 (×3): 30 mg/h via INTRAVENOUS
  Filled 2013-11-12 (×8): qty 200

## 2013-11-12 MED ORDER — AMIODARONE HCL IN DEXTROSE 360-4.14 MG/200ML-% IV SOLN
60.0000 mg/h | INTRAVENOUS | Status: AC
  Administered 2013-11-12: 60 mg/h via INTRAVENOUS
  Filled 2013-11-12: qty 200

## 2013-11-12 MED ORDER — SODIUM CHLORIDE 0.9 % IV SOLN: 250.0000 mL | INTRAVENOUS | Status: DC | PRN

## 2013-11-12 MED ORDER — MEMANTINE HCL 10 MG PO TABS
10.0000 mg | ORAL_TABLET | Freq: Every day | ORAL | Status: DC
  Administered 2013-11-12 – 2013-11-15 (×4): 10 mg via ORAL
  Filled 2013-11-12 (×4): qty 1

## 2013-11-12 MED ORDER — ONDANSETRON HCL 4 MG/2ML IJ SOLN
4.0000 mg | Freq: Four times a day (QID) | INTRAMUSCULAR | Status: DC | PRN
  Administered 2013-11-14 (×2): 4 mg via INTRAVENOUS
  Filled 2013-11-12: qty 2

## 2013-11-12 MED ORDER — POTASSIUM CHLORIDE CRYS ER 20 MEQ PO TBCR
20.0000 meq | EXTENDED_RELEASE_TABLET | Freq: Two times a day (BID) | ORAL | Status: DC
  Administered 2013-11-12 – 2013-11-15 (×6): 20 meq via ORAL
  Filled 2013-11-12 (×6): qty 1

## 2013-11-12 MED ORDER — ACETAMINOPHEN 325 MG PO TABS: 650.0000 mg | ORAL_TABLET | ORAL | Status: DC | PRN

## 2013-11-12 MED ORDER — AMIODARONE LOAD VIA INFUSION
150.0000 mg | Freq: Once | INTRAVENOUS | Status: AC
  Administered 2013-11-12: 150 mg via INTRAVENOUS
  Filled 2013-11-12 (×2): qty 83.34

## 2013-11-12 MED ORDER — SODIUM CHLORIDE 0.9 % IJ SOLN: 3.0000 mL | INTRAMUSCULAR | Status: DC | PRN

## 2013-11-12 MED ORDER — DILTIAZEM HCL 100 MG IV SOLR
5.0000 mg/h | INTRAVENOUS | Status: DC
  Administered 2013-11-12: 10 mg/h via INTRAVENOUS
  Filled 2013-11-12: qty 100

## 2013-11-12 MED ORDER — SODIUM CHLORIDE 0.9 % IV BOLUS (SEPSIS): 500.0000 mL | Freq: Once | INTRAVENOUS | Status: DC

## 2013-11-12 MED ORDER — FUROSEMIDE 10 MG/ML IJ SOLN
40.0000 mg | INTRAMUSCULAR | Status: DC
  Administered 2013-11-12 – 2013-11-15 (×8): 40 mg via INTRAVENOUS
  Filled 2013-11-12 (×13): qty 4

## 2013-11-12 MED ORDER — DILTIAZEM LOAD VIA INFUSION
10.0000 mg | Freq: Once | INTRAVENOUS | Status: AC
  Administered 2013-11-12: 10 mg via INTRAVENOUS
  Filled 2013-11-12: qty 10

## 2013-11-12 MED ORDER — SODIUM CHLORIDE 0.9 % IJ SOLN
3.0000 mL | Freq: Two times a day (BID) | INTRAMUSCULAR | Status: DC
  Administered 2013-11-12 – 2013-11-15 (×6): 3 mL via INTRAVENOUS

## 2013-11-12 NOTE — ED Provider Notes (Signed)
TIME SEEN: 12:35 PM  CHIEF COMPLAINT: Palpitations, shortness of breath, bilateral ankle swelling  HPI: Cheryl Zamora is an 80 year old female with history of hypertension, hyperlipidemia, coronary artery disease status post CABG, diastolic heart failure, paroxysmal atrial fibrillation on chronic Coumadin who had a cardioversion with Dr. [place] in October 2014 who presents emergency department with one week of shortness of breath is worse with exertion, bilateral lower extremity swelling and palpitations. She was seen by Cheryl PCP today, Dr. Joya Salm and found to be in atrial fibrillation with RVR. He recommended she come to the hospital for further evaluation and possible treatment in the ED of Cheryl symptoms. Cheryl Zamora denies any chest pain or chest discomfort. No fevers, chills, cough, vomiting or diarrhea, bloody stool or melena. She is here with Cheryl Zamora.  ROS: See HPI Constitutional: no fever  Eyes: no drainage  ENT: no runny nose   Cardiovascular:  no chest pain  Resp:  SOB  GI: no vomiting GU: no dysuria Integumentary: no rash  Allergy: no hives  Musculoskeletal:  leg swelling  Neurological: no slurred speech ROS otherwise negative  PAST MEDICAL HISTORY/PAST SURGICAL HISTORY:  Past Medical History  Diagnosis Date  . Hyperlipidemia   . Hypertension   . Coronary artery disease 2005    s/p CABG  . History of diastolic dysfunction     well compensated  . Back problem   . Neuropathy     MEDICATIONS:  Prior to Admission medications   Medication Sig Start Date End Date Taking? Authorizing Provider  furosemide (LASIX) 40 MG tablet Take 1 tablet (40 mg total) by mouth daily. 12/19/12   Rhonda G Barrett, PA-C  memantine (NAMENDA) 10 MG tablet Take 10 mg by mouth 2 (two) times daily.    Historical Provider, MD  metoprolol succinate (TOPROL XL) 25 MG 24 hr tablet Take 1 tablet (25 mg total) by mouth daily. 12/19/12   Rhonda G Barrett, PA-C  potassium chloride (K-DUR,KLOR-CON) 20 MEQ  tablet Take 1 tablet (20 mEq total) by mouth daily. 12/19/12   Rhonda G Barrett, PA-C  warfarin (COUMADIN) 5 MG tablet Take 1 tablet (5 mg total) by mouth daily. Start with 1/2 tablet daily 12/19/12   Evelene Croon Barrett, PA-C    ALLERGIES:  Allergies  Allergen Reactions  . Codeine Nausea And Vomiting    SOCIAL HISTORY:  History  Substance Use Topics  . Smoking status: Former Smoker    Quit date: 03/02/2003  . Smokeless tobacco: Not on file  . Alcohol Use: No    FAMILY HISTORY: Family History  Problem Relation Age of Onset  . Heart attack Mother   . Heart attack Father   . Heart attack Brother   . Heart attack Brother   . Heart attack Brother   . Heart attack Brother     EXAM: BP 99/70  Pulse 157  Temp(Src) 98.2 F (36.8 C) (Oral)  Resp 16  SpO2 99% CONSTITUTIONAL: Alert and oriented and responds appropriately to questions. Well-appearing; well-nourished HEAD: Normocephalic EYES: Conjunctivae clear, PERRL ENT: normal nose; no rhinorrhea; moist mucous membranes; pharynx without lesions noted NECK: Supple, no meningismus, no LAD  CARD: Tachycardic and irregularly irregular; S1 and S2 appreciated; no murmurs, no clicks, no rubs, no gallops RESP: Normal chest excursion without splinting or tachypnea; breath sounds clear and equal bilaterally; no wheezes, no rhonchi, bibasilar Rales, Cheryl Zamora become short of breath with sitting upright and will be speaking short sentences, no hypoxia ABD/GI: Normal bowel sounds; non-distended; soft, non-tender, no  rebound, no guarding BACK:  The back appears normal and is non-tender to palpation, there is no CVA tenderness EXT: Normal ROM in all joints; non-tender to palpation; no edema; normal capillary refill; no cyanosis    SKIN: Normal color for age and race; warm NEURO: Moves all extremities equally PSYCH: The Cheryl Zamora's mood and manner are appropriate. Grooming and personal hygiene are appropriate.  MEDICAL DECISION MAKING: Cheryl Zamora here  with atrial fibrillation with RVR. She was initially hypotensive but this is improved without intervention. She is on metoprolol daily for rate control and Coumadin. We'll obtain cardiac labs including troponin and BNP, chest x-ray. We'll give diltiazem and closely monitor.  ED PROGRESS: Pt's labs are relatively unremarkable. Cheryl BNP is elevated at 3700. Troponin negative. INR therapeutic. Chest x-ray clear. His stoma 140s to 150s despite being on diltiazem 10 mg/hr.  We'll increase to 15 mg per hour. We'll discuss with cardiology as she'll likely need admission for continued rate control.   3:00 PM  Cardiology to admit.     EKG Interpretation  Date/Time:  Monday November 12 2013 11:57:38 EDT Ventricular Rate:  141 PR Interval:    QRS Duration: 82 QT Interval:  326 QTC Calculation: 499 R Axis:   83 Text Interpretation:  Atrial flutter with variable A-V block Low voltage QRS Cannot rule out Anterior infarct , age undetermined Abnormal ECG Confirmed by WARD,  DO, KRISTEN [phone]) on 11/12/2013 12:28:09 PM          [place], DO 11/12/13 1548

## 2013-11-12 NOTE — ED Notes (Signed)
Dr. Ward at bedside.

## 2013-11-12 NOTE — ED Notes (Signed)
Started w/ high heart rate since last Thursday and saw dr today is in afib sent for further tests this is 2nd episode has some sob and swelling in ankles

## 2013-11-12 NOTE — H&P (Addendum)
History and Physical   Patient ID: Cheryl RITACCO MRN: [id number], DOB/AGE: 07-27-1923 80 y.o. Date of Encounter: 11/12/2013  Primary Physician: Geoffery Lyons, MD Primary Cardiologist: Dr. Martinique   Chief Complaint:  Rapid atrial fib  HPI: Cheryl Zamora is a 80 y.o. female with a history of CAD and D-CHF. She was hospitalized 11/2012 for CHF and diagnosed with atrial fib, rapid ventricular response. BP limited medications. She had a TEE/DCCV and was discharged in sinus rhythm. She has been on coumadin and low-dose Toprol XL 25 mg daily since then.  She told her son last week that she thought she was out of rhythm. This is the first time since last October. Her heart rate at that time was > 160 at that time. Her son has monitored her HR since then and it has been up consistently. She saw her family MD/NP today and was sent the ER. She does not weigh herself daily, but feels she has gained a few pounds. She denies PND or orthopnea. She has noticed LE edema, and increasing DOE over the last 2 weeks to 10 days. This has progressed to SOB at rest. Her family MD follows her INR and her son states she has been therapeutic consistently.  Past Medical History  Diagnosis Date  . Hyperlipidemia   . Hypertension   . Coronary artery disease 2005    s/p CABG  . History of diastolic dysfunction     well compensated  . Back problem   . Neuropathy   . Atrial fibrillation with rapid ventricular response 11/2012    s/p TEE/DCCV    Surgical History:  Past Surgical History  Procedure Laterality Date  . Cardiac catheterization  2005    LMain 60, LAD 90, D1 30, CFX 95->T, RCA OK  . Coronary artery bypass graft  2005    x 4; LIMA-LAD, SVG-D1, SVG-OM1-OM2  . Other surgical history      hysterectomy  . Other surgical history      tonsilectomy  . Back surgery      "neck sugery in 60's"  . Tee without cardioversion N/A 12/13/2012    Procedure: TRANSESOPHAGEAL ECHOCARDIOGRAM (TEE);  Surgeon:  Larey Dresser, MD;  Location: [facility] ENDOSCOPY;  Service: Cardiovascular;  Laterality: N/A;  . Cardioversion N/A 12/13/2012    Procedure: CARDIOVERSION;  Surgeon: Larey Dresser, MD;  Location: [facility] ENDOSCOPY;  Service: Cardiovascular;  Laterality: N/A;     I have reviewed the patient's current medications. Prior to Admission medications   Medication Sig Start Date End Date Taking? Authorizing Provider  furosemide (LASIX) 40 MG tablet Take 1 tablet (40 mg total) by mouth daily. 12/19/12  Yes Rhonda G Barrett, PA-C  memantine (NAMENDA) 10 MG tablet Take 10 mg by mouth daily.    Yes Historical Provider, MD  metoprolol succinate (TOPROL XL) 25 MG 24 hr tablet Take 1 tablet (25 mg total) by mouth daily. 12/19/12  Yes Rhonda G Barrett, PA-C  potassium chloride (K-DUR,KLOR-CON) 20 MEQ tablet Take 1 tablet (20 mEq total) by mouth daily. 12/19/12  Yes Rhonda G Barrett, PA-C  warfarin (COUMADIN) 5 MG tablet Take 2.5-5 mg by mouth daily. Take 2.5 mg daily except take 5 mg on Monday and Friday   Yes Historical Provider, MD   Scheduled Meds:  Continuous Infusions: . diltiazem (CARDIZEM) infusion 15 mg/hr (11/12/13 1437)   PRN Meds:.  Allergies:  Allergies  Allergen Reactions  . Codeine Nausea And Vomiting    History  Social History  . Marital Status: Single    Spouse Name: N/A    Number of Children: N/A  . Years of Education: N/A   Occupational History  . Retired    Social History Main Topics  . Smoking status: Former Smoker    Quit date: 03/02/2003  . Smokeless tobacco: Not on file  . Alcohol Use: No  . Drug Use: No  . Sexual Activity: Not Currently   Other Topics Concern  . Not on file   Social History Narrative   Pt son helps with her care.    Family History  Problem Relation Age of Onset  . Heart attack Mother   . Heart attack Father   . Heart attack Brother   . Heart attack Brother   . Heart attack Brother   . Heart attack Brother    Family Status  Relation Status  Death Age  . Mother Deceased 98  . Father Deceased 5  . Sister Deceased     colon cancer  . Brother Deceased   . Brother Deceased   . Brother Deceased   . Brother Deceased   . Brother Deceased     brain cancer    Review of Systems:   Full 14-point review of systems otherwise negative except as noted above.  Physical Exam: Blood pressure 107/63, pulse 154, temperature 98.2 F (36.8 C), temperature source Oral, resp. rate 21, SpO2 99.00%. General: Well developed, well nourished,female in respiratory distress. Head: Normocephalic, atraumatic, sclera non-icteric, no xanthomas, nares are without discharge. Dentition: poor Neck: No carotid bruits. JVD elevated at 14 cm. No thyromegally Lungs: Good expansion bilaterally. without wheezes or rhonchi. Bibasilar rales Heart: Tachy, irregular rate and rhythm with S1 S2.  No S3 or S4.  No murmur, no rubs, or gallops appreciated. Abdomen: Soft, non-tender, non-distended with normoactive bowel sounds. No hepatomegaly. No rebound/guarding. No obvious abdominal masses. Msk:  Strength and tone appear normal for age. No joint deformities or effusions, no spine or costo-vertebral angle tenderness. Extremities: No clubbing or cyanosis. 1 + edema.  Distal pedal pulses are 2+ in 4 extrem Neuro: Alert and oriented X 3. Moves all extremities spontaneously. No focal deficits noted. Psych:  Responds to questions appropriately with a normal affect. Skin: No rashes or lesions noted  Labs:   Lab Results  Component Value Date   WBC 9.3 11/12/2013   HGB 13.7 11/12/2013   HCT 42.6 11/12/2013   MCV 83.9 11/12/2013   PLT 141* 11/12/2013    Recent Labs  11/12/13 1201  INR 2.50*     Recent Labs Lab 11/12/13 1201  NA 139  K 4.1  CL 99  CO2 22  BUN 14  CREATININE 1.21*  CALCIUM 9.6  PROT 7.6  BILITOT 0.9  ALKPHOS 74  ALT 17  AST 23  GLUCOSE 106*    Recent Labs  11/12/13 1242  TROPIPOC 0.03   Pro B Natriuretic peptide (BNP)  Date/Time  Value Ref Range Status  11/12/2013 12:29 PM 3731.0* 0 - 450 pg/mL Final  12/12/2012  1:43 PM 2854.0* 0 - 450 pg/mL Final    Radiology/Studies: Dg Chest 2 View 11/12/2013   CLINICAL DATA:  Chest pain, shortness of breath  EXAM: CHEST  2 VIEW  COMPARISON:  December 12, 2012  FINDINGS: The heart size and mediastinal contours are stable. [name] status post prior median sternotomy and CABG. There is no focal infiltrate, pulmonary edema, or pleural effusion. Degenerative joint changes of the spine and kyphosis  of the spine are noted.  IMPRESSION: No active cardiopulmonary disease.   Electronically Signed   By: Abelardo Diesel M.D.   On: 11/12/2013 14:01     Cardiac Cath: no cath since CABG  Echo: 12/13/2012 TEE Study Conclusions - Left ventricle: The cavity size was normal. Wall thickness was normal. The estimated ejection fraction was 50%. Diffuse hypokinesis. - Aortic valve: There was no stenosis. - Aorta: Normal caliber wtih grade IV plaque in the descending thoracic aorta. - Mitral valve: Thickened mitral valve with mild prolapse, primarily of P1 scallop of posterior leaflet. Mild regurgitation. - Left atrium: The atrium was moderately dilated. No evidence of thrombus in the atrial cavity or appendage. There was smoke in the appendage. - Right ventricle: The cavity size was mildly dilated. Systolic function was mildly reduced. - Right atrium: The atrium was moderately dilated. - Atrial septum: Lipomatous atrial septal hypertrophy. No defect or patent foramen ovale was identified. Impressions: - May proceed to cardioversion.   ECG: Atrial fib, RVR  ASSESSMENT AND PLAN:  Principal Problem:   Atrial fibrillation with rapid ventricular response Active Problems:   Long term (current) use of anticoagulants   Acute on chronic diastolic CHF (congestive heart failure), NYHA class 4   Signed, Rosaria Ferries, PA-C 11/12/2013 3:35 PM Beeper [phone]  Patient seen with PA, agree with  the above note.  1. Atrial fibrillation with RVR: Patient has been in atrial fibrillation for several days now, HR in 150s currently despite diltiazem gtt. I suspect this has triggered CHF exacerbation.  - Start amiodarone gtt with bolus.  Can continue diltiazem gtt for now.  I think that ultimately she is going to need amiodarone to maintain NSR given severe symptoms when she goes into atrial fibrillation.  - Continue warfarin.  INR therapeutic today.  Will need to call PCP's office to get recent INRs.  If they have been therapeutic, plan DCCV in am (just had lunch).  If not, will need TEE-guided DCCV.  2. Acute on chronic primarily diastolic CHF: EF 93% on 71/69 TEE.  She is very volume overloaded today, think this is due to several days of atrial fibrillation with RVR.  Will start Lasix 40 mg IV every 8 hrs.  Follow creatinine closely.  Would get echo when she is in NSR again (10/14 echoes were during afib with RVR, difficult to assess EF).    3. CAD: Stable, no chest pain.  Initial troponin normal.  Cycle cardiac enzymes.    Loralie Champagne 11/12/2013 3:44 PM

## 2013-11-12 NOTE — ED Notes (Signed)
Cardiology at bedside.

## 2013-11-12 NOTE — Telephone Encounter (Signed)
DR HARDING  SPOKE TO DR ARONSON PATIENT WILL BE GOING TO ER- HEART RATE 150 ?[place].

## 2013-11-12 NOTE — Progress Notes (Signed)
INR  Date/Time Value Ref Range Status  11/12/2013 12:01 PM 2.50* 0.00 - 1.49 Final  May 2015 2.4 Per Dr. Mingo Amber note (Dentist)  05/29/2013 12:32 PM 2.6   Final  03/30/2013  1:11 PM 2.7   Final  03/02/2013  1:05 PM 2.4   Final   Pt has not been getting regular INR checks, they were being done at the cardiology office on [facility]. She had an INR in May prior to a tooth extraction, otherwise, not since March.  Rosaria Ferries, PA-C 11/12/2013 4:14 PM Beeper [phone]

## 2013-11-13 ENCOUNTER — Encounter (HOSPITAL_COMMUNITY)
Admission: EM | Disposition: A | Discharge status: Home Health | Payer: Self-pay | Source: Home / Self Care | Attending: Cardiology

## 2013-11-13 ENCOUNTER — Encounter (HOSPITAL_COMMUNITY): Payer: Self-pay | Admitting: Critical Care Medicine

## 2013-11-13 ENCOUNTER — Encounter (HOSPITAL_COMMUNITY): Payer: Medicare Other | Admitting: Critical Care Medicine

## 2013-11-13 ENCOUNTER — Inpatient Hospital Stay (HOSPITAL_COMMUNITY): Payer: Medicare Other | Admitting: Critical Care Medicine

## 2013-11-13 DIAGNOSIS — I4891 Unspecified atrial fibrillation: Secondary | ICD-10-CM

## 2013-11-13 DIAGNOSIS — I059 Rheumatic mitral valve disease, unspecified: Secondary | ICD-10-CM

## 2013-11-13 HISTORY — PX: TEE WITHOUT CARDIOVERSION: SHX5443

## 2013-11-13 HISTORY — PX: CARDIOVERSION: SHX1299

## 2013-11-13 LAB — BASIC METABOLIC PANEL
Anion gap: 16 — ABNORMAL HIGH (ref 5–15)
BUN: 15 mg/dL (ref 6–23)
CALCIUM: 9 mg/dL (ref 8.4–10.5)
CO2: 24 meq/L (ref 19–32)
Chloride: 97 mEq/L (ref 96–112)
Creatinine, Ser: 1.27 mg/dL — ABNORMAL HIGH (ref 0.50–1.10)
GFR calc Af Amer: 42 mL/min — ABNORMAL LOW (ref >90)
GFR calc non Af Amer: 37 mL/min — ABNORMAL LOW (ref >90)
GLUCOSE: 145 mg/dL — AB (ref 70–99)
Potassium: 3.9 mEq/L (ref 3.7–5.3)
Sodium: 137 mEq/L (ref 137–147)

## 2013-11-13 LAB — TROPONIN I: Troponin I: <0.3 ng/mL (ref <0.30)

## 2013-11-13 LAB — PROTIME-INR
INR: 3.37 — ABNORMAL HIGH (ref 0.00–1.49)
PROTHROMBIN TIME: 34.1 s — AB (ref 11.6–15.2)

## 2013-11-13 SURGERY — ECHOCARDIOGRAM, TRANSESOPHAGEAL
Anesthesia: General

## 2013-11-13 MED ORDER — BUTAMBEN-TETRACAINE-BENZOCAINE 2-2-14 % EX AERO
INHALATION_SPRAY | CUTANEOUS | Status: DC | PRN
  Administered 2013-11-13: 2 via TOPICAL

## 2013-11-13 MED ORDER — AMIODARONE HCL 150 MG/3ML IV SOLN: 150.0000 mg | Freq: Once | INTRAVENOUS | Status: DC

## 2013-11-13 MED ORDER — EPHEDRINE SULFATE 50 MG/ML IJ SOLN
INTRAMUSCULAR | Status: DC | PRN
  Administered 2013-11-13: 25 mg via INTRAVENOUS

## 2013-11-13 MED ORDER — PROPOFOL INFUSION 10 MG/ML OPTIME
INTRAVENOUS | Status: DC | PRN
  Administered 2013-11-13: 140 ug/kg/min via INTRAVENOUS

## 2013-11-13 MED ORDER — PHENYLEPHRINE HCL 10 MG/ML IJ SOLN
INTRAMUSCULAR | Status: DC | PRN
  Administered 2013-11-13: 120 ug via INTRAVENOUS
  Administered 2013-11-13: 200 ug via INTRAVENOUS

## 2013-11-13 MED ORDER — ATROPINE SULFATE 1 MG/ML IJ SOLN
INTRAMUSCULAR | Status: DC | PRN
Start: 2013-11-13 — End: 2013-11-13
  Administered 2013-11-13: .5 mg via INTRAVENOUS

## 2013-11-13 MED ORDER — LACTATED RINGERS IV SOLN
INTRAVENOUS | Status: DC
  Administered 2013-11-13: 1000 mL via INTRAVENOUS
  Filled 2013-11-13: qty 1000

## 2013-11-13 MED ORDER — AMIODARONE IV BOLUS ONLY 150 MG/100ML
150.0000 mg | Freq: Once | INTRAVENOUS | Status: AC
  Administered 2013-11-13: 150 mg via INTRAVENOUS
  Filled 2013-11-13 (×2): qty 100

## 2013-11-13 NOTE — Progress Notes (Signed)
  Echocardiogram Echocardiogram Transesophageal has been performed.  Cheryl Zamora 11/13/2013, 12:04 PM

## 2013-11-13 NOTE — CV Procedure (Signed)
     Transesophageal Echocardiogram Note  Cheryl Zamora [id number] 1923-09-26  Procedure: Transesophageal Echocardiogram Indications: atrial fibrillation  Procedure Details Consent: Obtained Time Out: Verified patient identification, verified procedure, site/side was marked, verified correct patient position, special equipment/implants available, Radiology Safety Procedures followed,  medications/allergies/relevent history reviewed, required imaging and test results available.  Performed  Medications: Propofol 110 mg  Left Ventrical:  Diffuse hypokinesis with severely impaired LVEF 25-30%.  Mitral Valve: Moderate MR.  Aortic Valve: Normal, no AI.  Tricuspid Valve: Moderate TR.   Pulmonic Valve: Normal, no PR.  Left Atrium/ Left atrial appendage: Moderate left atrial enlargement. Appendage has significantly decreased emptying and filling velocities and moderate smoke, but no thrombus.  Atrial septum: Lipomatous hypertrophy of the interatrial septum.  Aorta: Moderate non-mobile atherosclerotic plaque.  Complications: None Patient did tolerate procedure well.    Cardioversion Note  Cheryl Zamora [id number] January 30, 1924  Procedure: DC Cardioversion Indications: a-fib  Procedure Details Consent: Obtained Time Out: Verified patient identification, verified procedure, site/side was marked, verified correct patient position, special equipment/implants available, Radiology Safety Procedures followed,  medications/allergies/relevent history reviewed, required imaging and test results available.  Performed  The patient has been on adequate anticoagulation.  The patient received IV propofol 110 mg for sedation.  Synchronous cardioversion was performed at 120 joules.  The cardioversion was successful   Complications: Bradycardia post DCCV, efedrin and atropine 0.5 mg resolved bradycardia.  Patient did tolerate procedure well.   Daisi Spark, MD, [facility] 11/14/2013, 12:21  PM          Farzana Spark, MD, [facility] 11/13/2013, 9:57 AM

## 2013-11-13 NOTE — Anesthesia Procedure Notes (Signed)
Procedure Name: MAC Date/Time: 11/13/2013 11:10 AM Performed by: Carola Frost Pre-anesthesia Checklist: Patient identified, Timeout performed, Emergency Drugs available, Suction available and Patient being monitored Patient Re-evaluated:Patient Re-evaluated prior to inductionOxygen Delivery Method: Circle system utilized and Nasal cannula Intubation Type: IV induction Ventilation: Nasal airway inserted- appropriate to patient size Placement Confirmation: positive ETCO2 and breath sounds checked- equal and bilateral Dental Injury: Teeth and Oropharynx as per pre-operative assessment

## 2013-11-13 NOTE — Anesthesia Postprocedure Evaluation (Signed)
  Anesthesia Post-op Note  Patient: Cheryl Zamora  Procedure(s) Performed: Procedure(s): TRANSESOPHAGEAL ECHOCARDIOGRAM (TEE) (N/A) CARDIOVERSION (N/A)  Patient Location: Endoscopy Unit  Anesthesia Type:MAC  Level of Consciousness: sedated  Airway and Oxygen Therapy: Patient Spontanous Breathing and Patient connected to nasal cannula oxygen  Post-op Pain: none  Post-op Assessment: Post-op Vital signs reviewed, Patient's Cardiovascular Status Stable, Respiratory Function Stable, Patent Airway and No signs of Nausea or vomiting  Post-op Vital Signs: Reviewed and stable  Last Vitals:  Filed Vitals:   11/13/13 1105  BP: 125/88  Pulse: 132  Temp: 36.5 C  Resp: 27    Complications: No apparent anesthesia complications

## 2013-11-13 NOTE — Anesthesia Preprocedure Evaluation (Addendum)
Anesthesia Evaluation  Patient identified by MRN, date of birth, ID band Patient awake    Reviewed: Allergy & Precautions, H&P , NPO status , Patient's Chart, lab work & pertinent test results, reviewed documented beta blocker date and time   Airway       Dental  (+) Dental Advisory Given   Pulmonary former smoker,          Cardiovascular hypertension, Pt. on medications + CAD, + CABG and +CHF + dysrhythmias Atrial Fibrillation  Echo 10/14- Left ventricle: The cavity size was normal. Wall thickness   was normal. The estimated ejection fraction was 50%.   Diffuse hypokinesis. - Aortic valve: There was no stenosis. - Aorta: Normal caliber wtih grade IV plaque in the   descending thoracic aorta. - Mitral valve: Thickened mitral valve with mild prolapse,   primarily of P1 scallop of posterior leaflet. Mild   regurgitation. - Left atrium: The atrium was moderately dilated. No   evidence of thrombus in the atrial cavity or appendage.   There was smoke in the appendage. - Right ventricle: The cavity size was mildly dilated.   Systolic function was mildly reduced. - Right atrium: The atrium was moderately dilated. - Atrial septum: Lipomatous atrial septal hypertrophy. No   defect or patent foramen ovale was identified.   Neuro/Psych    GI/Hepatic   Endo/Other    Renal/GU      Musculoskeletal   Abdominal   Peds  Hematology   Anesthesia Other Findings   Reproductive/Obstetrics                         Anesthesia Physical Anesthesia Plan  ASA: III  Anesthesia Plan: General   Post-op Pain Management:    Induction: Intravenous  Airway Management Planned: Nasal Cannula  Additional Equipment:   Intra-op Plan:   Post-operative Plan:   Informed Consent: I have reviewed the patients History and Physical, chart, labs and discussed the procedure including the risks, benefits and alternatives for  the proposed anesthesia with the patient or authorized representative who has indicated his/her understanding and acceptance.   Dental advisory given  Plan Discussed with: Anesthesiologist and Surgeon  Anesthesia Plan Comments:         Anesthesia Quick Evaluation

## 2013-11-13 NOTE — Progress Notes (Signed)
UR completed Hong Moring K. Jenaveve Fenstermaker, RN, BSN, [place], CCM  11/13/2013 11:27 AM

## 2013-11-13 NOTE — Care Management Note (Addendum)
  Page 2 of 2   11/15/2013     2:37:35 PM CARE MANAGEMENT NOTE 11/15/2013  Patient:  Cheryl Zamora, Cheryl Zamora   Account Number:  [id number]  Date Initiated:  11/13/2013  Documentation initiated by:  Dontrez Pettis  Subjective/Objective Assessment:   Afib     Action/Plan:   CM to follow for disposition needs   Anticipated DC Date:  11/16/2013   Anticipated DC Plan:  [place]  CM consult      [facility] Choice  HOME HEALTH   Choice offered to / List presented to:  C-1 Patient        [place] arranged  HH-1 RN  [place].   Status of service:  Completed, signed off Medicare Important Message given?  YES (If response is "NO", the following Medicare IM given date fields will be blank) Date Medicare IM given:  11/14/2013 Medicare IM given by:  Yoseph Haile Date Additional Medicare IM given:   Additional Medicare IM given by:    Discharge Disposition:    Per UR Regulation:  Reviewed for med. necessity/level of care/duration of stay  If discussed at [place] of Stay Meetings, dates discussed:    Comments:  Delana Manganello RN, BSN, MSHL, CCM  Nurse - Case Manager,  (Unit [facility])  [phone]  11/15/2013 Initial IM 11/12/2013 provided by admissions Social:  From home alone DX:  Afib Dispo Plan:  Home with HHS:  RN, PT, HHA ([place] notified)

## 2013-11-13 NOTE — Progress Notes (Signed)
CRITICAL CARE Performed by: Bonne Dolores   Total critical care time: [place] care time was exclusive of separately billable procedures and treating other patients.  Critical care was necessary to treat or prevent imminent or life-threatening deterioration.  Critical care was time spent personally by me on the following activities: development of treatment plan with patient and/or surrogate as well as nursing, discussions with consultants, evaluation of patient's response to treatment, examination of patient, obtaining history from patient or surrogate, ordering and performing treatments and interventions, ordering and review of laboratory studies, ordering and review of radiographic studies, pulse oximetry and re-evaluation of patient's condition.

## 2013-11-13 NOTE — Transfer of Care (Signed)
Immediate Anesthesia Transfer of Care Note  Patient: Cheryl Zamora  Procedure(s) Performed: Procedure(s): TRANSESOPHAGEAL ECHOCARDIOGRAM (TEE) (N/A) CARDIOVERSION (N/A)  Patient Location: Endoscopy Unit  Anesthesia Type:MAC  Level of Consciousness: sedated  Airway & Oxygen Therapy: Patient Spontanous Breathing and Patient connected to nasal cannula oxygen  Post-op Assessment: Report given to PACU RN and Post -op Vital signs reviewed and stable  Post vital signs: Reviewed and stable  Complications: No apparent anesthesia complications

## 2013-11-13 NOTE — Progress Notes (Signed)
2111Rosaria Ferries, PA has been paged in regards to the patient's heart rate. She is still in A-Fib with a rate between 130s-140s. She has been at this rate all night. On Amiodarone 30 mg/hr. She is scheduled to go for a TEE this AM  0740: Paged on-call doctor.  5520: Dr. Nila Nephew returned paged. Will give patient a bolus of amiodarone per Dr. Nila Nephew. Awaiting for orders to be put in

## 2013-11-14 ENCOUNTER — Encounter (HOSPITAL_COMMUNITY): Payer: Self-pay | Admitting: Cardiology

## 2013-11-14 DIAGNOSIS — I25810 Atherosclerosis of coronary artery bypass graft(s) without angina pectoris: Secondary | ICD-10-CM

## 2013-11-14 DIAGNOSIS — R9431 Abnormal electrocardiogram [ECG] [EKG]: Secondary | ICD-10-CM

## 2013-11-14 DIAGNOSIS — Z7901 Long term (current) use of anticoagulants: Secondary | ICD-10-CM

## 2013-11-14 LAB — URINALYSIS, ROUTINE W REFLEX MICROSCOPIC
Glucose, UA: NEGATIVE mg/dL
KETONES UR: 15 mg/dL — AB
NITRITE: NEGATIVE
Protein, ur: 100 mg/dL — AB
SPECIFIC GRAVITY, URINE: 1.019 (ref 1.005–1.030)
Urobilinogen, UA: 1 mg/dL (ref 0.0–1.0)
pH: 5 (ref 5.0–8.0)

## 2013-11-14 LAB — URINE MICROSCOPIC-ADD ON

## 2013-11-14 LAB — BASIC METABOLIC PANEL
Anion gap: 24 — ABNORMAL HIGH (ref 5–15)
BUN: 17 mg/dL (ref 6–23)
CHLORIDE: 91 meq/L — AB (ref 96–112)
CO2: 18 mEq/L — ABNORMAL LOW (ref 19–32)
Calcium: 8.9 mg/dL (ref 8.4–10.5)
Creatinine, Ser: 1.48 mg/dL — ABNORMAL HIGH (ref 0.50–1.10)
GFR calc Af Amer: 35 mL/min — ABNORMAL LOW (ref >90)
GFR calc non Af Amer: 30 mL/min — ABNORMAL LOW (ref >90)
Glucose, Bld: 166 mg/dL — ABNORMAL HIGH (ref 70–99)
Potassium: 4.2 mEq/L (ref 3.7–5.3)
Sodium: 133 mEq/L — ABNORMAL LOW (ref 137–147)

## 2013-11-14 LAB — PROTIME-INR
INR: 3.94 — AB (ref 0.00–1.49)
Prothrombin Time: 38.5 seconds — ABNORMAL HIGH (ref 11.6–15.2)

## 2013-11-14 LAB — MAGNESIUM: Magnesium: 1.9 mg/dL (ref 1.5–2.5)

## 2013-11-14 LAB — GLUCOSE, CAPILLARY: GLUCOSE-CAPILLARY: 179 mg/dL — AB (ref 70–99)

## 2013-11-14 NOTE — Progress Notes (Signed)
Notified by central telemetry that pt has a QTc of 601. EKG done. Pt c/o nausea and urinary urgency. UA sent. Notified MD of QTc and nausea and UA. MD will come to bedside to assess. Will continue to monitor pt closely.   Eulis Canner, RN

## 2013-11-14 NOTE — Progress Notes (Signed)
RN notified by NT that pt. Had blood in her urine. Pt. Denies any pain or discomfort. RN will collect urine specimen for urinalysis and continue to monitor pt. For changes in condition. Alyshia Kernan, Katherine Roan

## 2013-11-14 NOTE — Progress Notes (Signed)
Patient Profile: 80 y.o. female with a history of CAD, PAF on warfarin and D-CHF, admitted for Afib w/ RVR and A/C diastolic CHF.    Subjective: Improved. Denies any further palpitations. Breathing significantly improved. No chest pain. She notes mild nausea + emesis x 2 last night and x 1 this am. No abdominal/pelvic pain. No dysuria.   Objective: Vital signs in last 24 hours: Temp:  [96.4 F (35.8 C)-98.1 F (36.7 C)] 97.6 F (36.4 C) (09/16 0807) Pulse Rate:  [64-85] 64 (09/16 0807) Resp:  [16-24] 16 (09/16 0807) BP: (96-131)/(60-84) 125/75 mmHg (09/16 0807) SpO2:  [95 %-100 %] 96 % (09/16 0807) Weight:  [166 lb 3.6 oz (75.4 kg)] 166 lb 3.6 oz (75.4 kg) (09/16 0524) Last BM Date: 11/12/13  Intake/Output from previous day: 09/15 0701 - 09/16 0700 In: 520 [P.O.:120; I.V.:400] Out: 850 [Urine:850] Intake/Output this shift:    Medications Current Facility-Administered Medications  Medication Dose Route Frequency Provider Last Rate Last Dose  . 0.9 %  sodium chloride infusion   Intravenous Continuous Evelene Croon Barrett, PA-C 20 mL/hr at 11/12/13 2128    . 0.9 %  sodium chloride infusion  250 mL Intravenous PRN Rhonda G Barrett, PA-C      . acetaminophen (TYLENOL) tablet 650 mg  650 mg Oral Q4H PRN Rhonda G Barrett, PA-C      . ALPRAZolam Duanne Moron) tablet 0.25 mg  0.25 mg Oral BID PRN Evelene Croon Barrett, PA-C      . furosemide (LASIX) injection 40 mg  40 mg Intravenous [id number] Rhonda G Barrett, PA-C   40 mg at 11/14/13 0806  . lactated ringers infusion   Intravenous Continuous Taleya Spark, MD 50 mL/hr at 11/13/13 1108 1,000 mL at 11/13/13 1108  . memantine (NAMENDA) tablet 10 mg  10 mg Oral Daily Evelene Croon Barrett, PA-C   10 mg at 11/14/13 1132  . nitroGLYCERIN (NITROSTAT) SL tablet 0.4 mg  0.4 mg Sublingual Q5 Min x 3 PRN Rhonda G Barrett, PA-C      . ondansetron (ZOFRAN) injection 4 mg  4 mg Intravenous Q6H PRN Rhonda G Barrett, PA-C   4 mg at 11/14/13 1043  . potassium  chloride SA (K-DUR,KLOR-CON) CR tablet 20 mEq  20 mEq Oral BID Evelene Croon Barrett, PA-C   20 mEq at 11/14/13 1133  . sodium chloride 0.9 % injection 3 mL  3 mL Intravenous Q12H Rhonda G Barrett, PA-C   3 mL at 11/14/13 0807  . sodium chloride 0.9 % injection 3 mL  3 mL Intravenous PRN Rhonda G Barrett, PA-C      . zolpidem (AMBIEN) tablet 5 mg  5 mg Oral QHS PRN Lonn Georgia, PA-C        PE: General appearance: alert, cooperative and no distress Lungs: bibasilar rales R>L Heart: brady, regular rhythym  Extremities: 1+ pedial emema Pulses: 2+ radials, decreased distal pulses  Skin: Skin color, texture, turgor normal. No rashes or lesions Neurologic: Grossly normal  Lab Results:   Recent Labs  11/12/13 1201  WBC 9.3  HGB 13.7  HCT 42.6  PLT 141*   BMET  Recent Labs  11/12/13 1201 11/13/13 0331 11/14/13 0302  NA 139 137 133*  K 4.1 3.9 4.2  CL 99 97 91*  CO2 22 24 18*  GLUCOSE 106* 145* 166*  BUN 14 15 17   CREATININE 1.21* 1.27* 1.48*  CALCIUM 9.6 9.0 8.9   PT/INR  Recent Labs  11/12/13 1201 11/13/13 0720 11/14/13 0302  LABPROT 27.0* 34.1* 38.5*  INR 2.50* 3.37* 3.94*   Cardiac Panel (last 3 results)  Recent Labs  11/12/13 2226 11/13/13 0331 11/13/13 0947  TROPONINI <0.30 <0.30 <0.30     Assessment/Plan  Principal Problem:   Atrial fibrillation with rapid ventricular response Active Problems:   Long term (current) use of anticoagulants   Acute on chronic diastolic CHF (congestive heart failure), NYHA class 4   Atrial fibrillation, rapid  1. Atrial Fibrillation w/ RVR: now sinus brady with rate in the upper 50s. IV amiodarone was discontinued due to prolonged QTc.   2. Prolonged QTc: Mg and K both WNL. IV amiodarone discontinued.   3. Acute on Chronic Diastolic CHF: still volume overloaded. Continue with IV Lasix, but will need to monitor renal function closely as SCr has increased to 1.48. Low sodium diet. Strict I/Os.   4. N/V: patient  notes only small volume emesis x 1 earlier today. Currently feels ok. PRN zofran. Monitor electrolytes closely if high volume emesis occurs.   5. Hematuria: UA positive for Hgb, bilirubin, ketones and small amount of leukocytes. Nitrite negative. INR is supratherapeutic at 3.94. Would hold warfarin for now.       LOS: 2 days    Brittainy M. Rosita Fire, PA-C 11/14/2013 11:53 AM  I have seen and examined the patient along with Brittainy M. Rosita Fire, PA-C.  I have reviewed the chart, notes and new data.  I agree with PA's note.  Key new complaints: nausea resolved after we stopped amiodarone Key examination changes: only about 4 lb above her weight when she last saw Dr. Martinique in clinic Key new findings / data: marked QT prolongation on IV amiodarone, despite normal electrolytes and no other QT prolonging drugs  PLAN: Monitor for normalization of QT and reduction in excessive INR prolongation. Probably DC tomorrow. I would not use amiodarone again and will add it to her drug intolerance list  Sanda Klein, MD, [facility] [phone] 11/14/2013, 1:53 PM

## 2013-11-14 NOTE — Progress Notes (Addendum)
Pt. C/o of nausea and vomiting. Also stated that she is hot one minute and cold the next. Pt. Skin cold and clammy upon assessment. CBG and VS assessed. VSS. Pt. Alert and oriented. No s/s of distress or discomfort noted. PRN meds given for N/V. RN will continue to monitor pt. For changes in condition.Arcadio Cope, Katherine Roan

## 2013-11-15 DIAGNOSIS — I5032 Chronic diastolic (congestive) heart failure: Secondary | ICD-10-CM

## 2013-11-15 LAB — BASIC METABOLIC PANEL
ANION GAP: 18 — AB (ref 5–15)
BUN: 26 mg/dL — ABNORMAL HIGH (ref 6–23)
CO2: 23 meq/L (ref 19–32)
Calcium: 8.8 mg/dL (ref 8.4–10.5)
Chloride: 92 mEq/L — ABNORMAL LOW (ref 96–112)
Creatinine, Ser: 1.89 mg/dL — ABNORMAL HIGH (ref 0.50–1.10)
GFR calc non Af Amer: 23 mL/min — ABNORMAL LOW (ref >90)
GFR, EST AFRICAN AMERICAN: 26 mL/min — AB (ref >90)
Glucose, Bld: 104 mg/dL — ABNORMAL HIGH (ref 70–99)
POTASSIUM: 4.6 meq/L (ref 3.7–5.3)
SODIUM: 133 meq/L — AB (ref 137–147)

## 2013-11-15 LAB — PROTIME-INR
INR: 3.83 — ABNORMAL HIGH (ref 0.00–1.49)
PROTHROMBIN TIME: 37.7 s — AB (ref 11.6–15.2)

## 2013-11-15 NOTE — Discharge Summary (Signed)
Physician Discharge Summary  Patient ID: Cheryl Zamora MRN: [id number] DOB/AGE: 78/20/1925 78 y.o.  Primary Cardiologist: Dr. Martinique  Admit date: 11/12/2013 Discharge date: 11/15/2013  Admission Diagnoses: Atrial Fibrillation with RVR, A/C Diastolic HF  Discharge Diagnoses:  Principal Problem:   Atrial fibrillation with rapid ventricular response Active Problems:   Long term (current) use of anticoagulants   Acute on chronic diastolic CHF (congestive heart failure), NYHA class 4   Atrial fibrillation, rapid   Discharged Condition: stable  Hospital Course: Cheryl Zamora is a 80 y.o. female with a history of CAD and D-CHF. She was hospitalized 11/2012 for CHF and diagnosed with atrial fib with a rapid ventricular response. BP limited medications. She had a TEE/DCCV and was discharged in sinus rhythm. She has been on coumadin and low-dose Toprol XL 25 mg daily since then.  She presented back to [facility] on 11/12/13 with complaints of increasing dyspnea, weight gain and LEE. In the ER, she was noted to be in atrial fibrillation w/ RVR. Rate was in the 150s. She was also noted to be volume overloaded. BNP was elevated at 3,731. Cardiac enzymes were negative and TSH was WNL. Mg and K were also stable. She was placed on IV Cardizem and IV Lasix. She had little improvement in rate with the Cardizem. Subsequently, she was placed on IV amiodarone. She underwent a TEE that was negative for clot, then underwent successful DCCV. Post-procedural EKG demonstrated sinus bradycardia and she did require a dose of atropine x 1 and rate improved. The plan was to transition to PO amiodarone in a effort to maintain SR, however she developed a prolonged QT >500, requiring discontinuation of amiodarone. Amiodarone was added to her drug intolerance list. Despite her prolonged Qt, she remained asymptomatic and had no arrhthymias on telemetry. Daily EKGs were followed and demonstrated serial improvement.  She also had a  supratherapeutic INR, up to 3.94, and her wafarin was held.   By hospital day 3, she was overall improved. She had no recurrent arrhthymias. She was diuresed back to a euvolemic state. She had no further symptoms. She was evaluated by PT prior to discharge and Home Health was recommended.  She was last seen and examined by Dr. Sallyanne Kuster. He determined she was stable from a cardiac standpoint. He recommended a f/u EKG and INR as an outpatient, which has been scheduled for 11/19/13. Post-hospital f/u has been arranged with [facility], on 12/06/13.   It should also be noted that her EF by TEE prior to DCCV was estimated at 25-30%. However, ? Accuracy of EF estimation by TEE during rapid atrial fibrillation. For this reason, it has been recommended that she undergo assessment with an OP NST.   Consults: None  Significant Diagnostic Studies: TEE Left Ventrical: Diffuse hypokinesis with severely impaired LVEF 25-30%.  Mitral Valve: Moderate MR.  Aortic Valve: Normal, no AI.  Tricuspid Valve: Moderate TR.  Pulmonic Valve: Normal, no PR.  Left Atrium/ Left atrial appendage: Moderate left atrial enlargement. Appendage has significantly decreased emptying and filling velocities and moderate smoke, but no thrombus.  Atrial septum: Lipomatous hypertrophy of the interatrial septum.  Aorta: Moderate non-mobile atherosclerotic plaque.   Treatments: See Hospital Course  Discharge Exam: Blood pressure 101/81, pulse 83, temperature 98 F (36.7 C), temperature source Oral, resp. rate 18, height 5\' 4"  (1.626 m), weight 165 lb 5.5 oz (75 kg), SpO2 96.00%.   Disposition: 01-Home or Self Care      Discharge Instructions   Diet -  low sodium heart healthy    Complete by:  As directed      Discharge instructions    Complete by:  As directed   Don't take warfarin again until you are seen in our office next week. You will need an INR check and a repeat EKG in our office early next week.     Face-to-face  encounter (required for Medicare/Medicaid patients)    Complete by:  As directed   I Zeno Hickel certify that this patient is under my care and that I, or a nurse practitioner or physician's assistant working with me, had a face-to-face encounter that meets the physician face-to-face encounter requirements with this patient on 11/15/2013. The encounter with the patient was in whole, or in part for the following medical condition(s) which is the primary reason for home health care (List medical condition): heart failure with change/decline in patient status  The encounter with the patient was in whole, or in part, for the following medical condition, which is the primary reason for home health care:  heart failure  I certify that, based on my findings, the following services are medically necessary home health services:  Nursing  My clinical findings support the need for the above services:  Unable to leave home safely without assistance and/or assistive device  Further, I certify that my clinical findings support that this patient is homebound due to:  Unable to leave home safely without assistance  Reason for Medically Necessary Home Health Services:  Skilled Nursing- Change/Decline in Patient Status     Home Health    Complete by:  As directed   To provide the following care/treatments:   RN PT       Increase activity slowly    Complete by:  As directed             Medication List    STOP taking these medications       warfarin 5 MG tablet  Commonly known as:  COUMADIN      TAKE these medications       furosemide 40 MG tablet  Commonly known as:  LASIX  Take 1 tablet (40 mg total) by mouth daily.     memantine 10 MG tablet  Commonly known as:  NAMENDA  Take 10 mg by mouth daily.     metoprolol succinate 25 MG 24 hr tablet  Commonly known as:  TOPROL XL  Take 1 tablet (25 mg total) by mouth daily.     potassium chloride SA 20 MEQ tablet  Commonly known as:   K-DUR,KLOR-CON  Take 1 tablet (20 mEq total) by mouth daily.       Follow-up Information   Follow up with [facility] On 11/19/2013. (Monday Sep. 21, 2015 10:00 am (for EKG and INR check))    Contact information:   [address] [place] [address] [phone]      Follow up with Tarri Fuller, PA-C On 12/06/2013. (2:00 pm Hospital Follow-up )    Specialty:  Physician Assistant   Contact information:   [address] [place] [address] [place] [address] [phone]       Follow up with [facility]. (Equities trader, Physical therapy and Home Health Aid to start services within 24-48 hours of discharge)    Contact information:   [address] [place] [address] [phone]     TIME SPENT ON DISCHARGE, INCLUDING PHYSICIAN TIME: >30 MINUTES  Signed: Lyda Jester 11/15/2013, 2:59 PM

## 2013-11-15 NOTE — Evaluation (Signed)
Physical Therapy Evaluation Patient Details Name: Cheryl Zamora MRN: [id number] DOB: 18-Nov-1923 Today's Date: 11/15/2013   History of Present Illness  80 y.o. female with a history of CAD, PAF on warfarin and D-CHF, admitted for Afib w/ RVR and A/C diastolic CHF  Clinical Impression  **Pt ambulated 160' with RW independently, no LOB. OK to DC home from PT standpoint. *    Follow Up Recommendations No PT follow up    Equipment Recommendations  None recommended by PT    Recommendations for Other Services       Precautions / Restrictions Precautions Precautions: Fall Restrictions Weight Bearing Restrictions: No      Mobility  Bed Mobility Overal bed mobility: Independent                Transfers Overall transfer level: Modified independent Equipment used: Rolling walker (2 wheeled)                Ambulation/Gait Ambulation/Gait assistance: Modified independent (Device/Increase time) Ambulation Distance (Feet): 160 Feet Assistive device: Rolling walker (2 wheeled) Gait Pattern/deviations: WFL(Within Functional Limits)   Gait velocity interpretation: at or above normal speed for age/gender General Gait Details: steady with RW, no LOB  Stairs            Wheelchair Mobility    Modified Rankin (Stroke Patients Only)       Balance Overall balance assessment: Modified Independent                                           Pertinent Vitals/Pain Pain Assessment: No/denies pain    Home Living Family/patient expects to be discharged to:: Private residence Living Arrangements: Alone Available Help at Discharge: Available PRN/intermittently Type of Home: House Home Access: Stairs to enter Entrance Stairs-Rails: Left Entrance Stairs-Number of Steps: 2 Home Layout: One level Home Equipment: Walker - 2 wheels;Walker - standard;Cane - single point;Shower seat      Prior Function Level of Independence: Independent with assistive  device(s)         Comments: walks with RW, does bathing/dressing independently, drives, nephew is local and assists PRN     Hand Dominance        Extremity/Trunk Assessment   Upper Extremity Assessment: Overall WFL for tasks assessed           Lower Extremity Assessment: Overall WFL for tasks assessed      Cervical / Trunk Assessment: Normal  Communication   Communication: HOH  Cognition Arousal/Alertness: Awake/alert Behavior During Therapy: WFL for tasks assessed/performed Overall Cognitive Status: Within Functional Limits for tasks assessed                      General Comments      Exercises        Assessment/Plan    PT Assessment Patent does not need any further PT services  PT Diagnosis     PT Problem List    PT Treatment Interventions     PT Goals (Current goals can be found in the Care Plan section) Acute Rehab PT Goals Patient Stated Goal: go home PT Goal Formulation: No goals set, d/c therapy    Frequency     Barriers to discharge        Co-evaluation               End of Session Equipment Utilized During Treatment:  Gait belt Activity Tolerance: Patient tolerated treatment well Patient left: in chair;with call bell/phone within reach;with family/visitor present Nurse Communication: Mobility status         Time: 1142-1209 PT Time Calculation (min): 27 min   Charges:   PT Evaluation $Initial PT Evaluation Tier I: 1 Procedure PT Treatments $Gait Training: 8-22 mins   PT G Codes:          [name] 11/15/2013, 12:25 PM [phone]

## 2013-11-15 NOTE — Progress Notes (Signed)
Discussed discharge instructions with pt and nephew including how and when to call the dr, follow up appts, medications to take at home, activity, and diet. Pt verbalized understanding and denied any questions.  Eulis Canner, RN

## 2013-11-15 NOTE — Progress Notes (Signed)
PT Cancellation Note  Patient Details Name: Cheryl Zamora MRN: [id number] DOB: 12-04-1923   Cancelled Treatment:    Reason Eval/Treat Not Completed: Fatigue/lethargy limiting ability to participate (pt stated she wants to nap right now and requested PT attempt later. Will follow. )   [name] 11/15/2013, 9:57 AM [phone]

## 2013-11-15 NOTE — Progress Notes (Signed)
Patient Profile: 80 y.o. female with a history of CAD, PAF on warfarin and D-CHF, admitted for Afib w/ RVR and A/C diastolic CHF.   Subjective: No major complaints. No palpitations, dyspnea or chest pain.    Objective: Vital signs in last 24 hours: Temp:  [97.6 F (36.4 C)-98.4 F (36.9 C)] 98 F (36.7 C) (09/17 0751) Pulse Rate:  [57-62] 62 (09/17 0751) Resp:  [16-18] 18 (09/17 0542) BP: (101-120)/(50-81) 101/81 mmHg (09/17 0751) SpO2:  [94 %-99 %] 96 % (09/17 0751) Weight:  [165 lb 5.5 oz (75 kg)] 165 lb 5.5 oz (75 kg) (09/17 0542) Last BM Date: 11/12/13  Intake/Output from previous day: 09/16 0701 - 09/17 0700 In: 120 [P.O.:120] Out: 1600 [Urine:1600] Intake/Output this shift: Total I/O In: 240 [P.O.:240] Out: 300 [Urine:300]  Medications Current Facility-Administered Medications  Medication Dose Route Frequency Provider Last Rate Last Dose  . 0.9 %  sodium chloride infusion   Intravenous Continuous Evelene Croon Barrett, PA-C 20 mL/hr at 11/12/13 2128    . 0.9 %  sodium chloride infusion  250 mL Intravenous PRN Rhonda G Barrett, PA-C      . acetaminophen (TYLENOL) tablet 650 mg  650 mg Oral Q4H PRN Rhonda G Barrett, PA-C      . ALPRAZolam (XANAX) tablet 0.25 mg  0.25 mg Oral BID PRN Evelene Croon Barrett, PA-C      . furosemide (LASIX) injection 40 mg  40 mg Intravenous [id number] Rhonda G Barrett, PA-C   40 mg at 11/15/13 0843  . lactated ringers infusion   Intravenous Continuous Tyneshia Spark, MD 50 mL/hr at 11/13/13 1108 1,000 mL at 11/13/13 1108  . memantine (NAMENDA) tablet 10 mg  10 mg Oral Daily Evelene Croon Barrett, PA-C   10 mg at 11/14/13 1132  . nitroGLYCERIN (NITROSTAT) SL tablet 0.4 mg  0.4 mg Sublingual Q5 Min x 3 PRN Rhonda G Barrett, PA-C      . ondansetron (ZOFRAN) injection 4 mg  4 mg Intravenous Q6H PRN Rhonda G Barrett, PA-C   4 mg at 11/14/13 1043  . potassium chloride SA (K-DUR,KLOR-CON) CR tablet 20 mEq  20 mEq Oral BID Evelene Croon Barrett, PA-C   20 mEq at  11/14/13 2240  . sodium chloride 0.9 % injection 3 mL  3 mL Intravenous Q12H Rhonda G Barrett, PA-C   3 mL at 11/14/13 2240  . sodium chloride 0.9 % injection 3 mL  3 mL Intravenous PRN Rhonda G Barrett, PA-C      . zolpidem (AMBIEN) tablet 5 mg  5 mg Oral QHS PRN Lonn Georgia, PA-C        PE: General appearance: alert, cooperative and no distress Lungs: CTAB Heart: brady, regular rhythym  Extremities: 1+ pedial emema Pulses: 2+ radials, decreased distal pulses  Skin: Skin color, texture, turgor normal. No rashes or lesions Neurologic: Grossly normal  Lab Results:   Recent Labs  11/12/13 1201  WBC 9.3  HGB 13.7  HCT 42.6  PLT 141*   BMET  Recent Labs  11/13/13 0331 11/14/13 0302 11/15/13 0405  NA 137 133* 133*  K 3.9 4.2 4.6  CL 97 91* 92*  CO2 24 18* 23  GLUCOSE 145* 166* 104*  BUN 15 17 26*  CREATININE 1.27* 1.48* 1.89*  CALCIUM 9.0 8.9 8.8   PT/INR  Recent Labs  11/13/13 0720 11/14/13 0302 11/15/13 0405  LABPROT 34.1* 38.5* 37.7*  INR 3.37* 3.94* 3.83*   Cardiac Panel (last 3 results)  Recent Labs  11/12/13 2226 11/13/13 0331 11/13/13 0947  TROPONINI <0.30 <0.30 <0.30     Assessment/Plan  Principal Problem:   Atrial fibrillation with rapid ventricular response Active Problems:   Long term (current) use of anticoagulants   Acute on chronic diastolic CHF (congestive heart failure), NYHA class 4   Atrial fibrillation, rapid  1. Atrial Fibrillation w/ RVR: now sinus brady with rate in the 60s. IV amiodarone was discontinued due to prolonged QTc. On Coumadin for stroke prophylaxis.   2. Prolonged QTc: EKG today continues to demonstrate prolonged QT/QTc at 604/608. Mg, K and Ca all are WNL. IV amiodarone discontinued. Amiodarone now listed as an allergy. Will also d/c PRN Zofran. RN notified. Continue to monitor on telemetry.   3. Acute on Chronic Diastolic CHF: breathing improved. She diuresed 1.6L in past 24 hrs and 2.7L total. D/C IV  Lasix given AKI.  4. AKI: SCr has further increased from 1.27-->1.48-->1.89. Stop IV Lasix.   5. N/V: resolved. Currently feels ok. Will d/c prn Zofran due to QT prolongation. Monitor electrolytes closely if high volume emesis occurs.   6. Hematuria: UA positive for Hgb, bilirubin, ketones and small amount of leukocytes. Nitrite negative. INR is supratherapeutic at 3.83. Continue to hold warfarin.   7. Supratherapeutic INR: INR improved but still elevated at 3.83. Continue to hold warfarin.        LOS: 3 days    Brittainy M. Rosita Fire, PA-C 11/15/2013 10:21 AM  I have seen and examined the patient along with Brittainy M. Rosita Fire, PA-C.  I have reviewed the chart, notes and new data.  I agree with PA's note.  Both QTc and INR are still excessively prolonged, but are moving in the right direction. She is asymptomatic and has not had any ventricular arrhythmia.   I think she can go home, but will need f/u early next week repeat ECG and INR.   Treatment of recurrent AFib will be challenging. Resume her beta blocker, but I am reluctant to increase the dose since ensuing bradycardia may further increase her QT.  Sanda Klein, MD, [place] [phone] 11/15/2013, 1:21 PM

## 2013-11-19 ENCOUNTER — Institutional Professional Consult (permissible substitution): Payer: Medicare Other

## 2013-11-21 ENCOUNTER — Ambulatory Visit (INDEPENDENT_AMBULATORY_CARE_PROVIDER_SITE_OTHER): Payer: Medicare Other | Admitting: Pharmacist Clinician (PhC)/ Clinical Pharmacy Specialist

## 2013-11-21 ENCOUNTER — Telehealth: Payer: Self-pay | Admitting: Cardiology

## 2013-11-21 DIAGNOSIS — Z7901 Long term (current) use of anticoagulants: Secondary | ICD-10-CM

## 2013-11-21 LAB — POCT INR: INR: 1.2

## 2013-11-21 NOTE — Telephone Encounter (Signed)
V/O for PT/INR given to Cheryl Zamora. Provided her with Dr. Doug Sou fax number (new location) and asked that she call office with results Cheryl Zamora)

## 2013-11-21 NOTE — Telephone Encounter (Signed)
She needs an order to draw INR today at patient's home.

## 2013-11-29 ENCOUNTER — Ambulatory Visit: Payer: Medicare Other | Admitting: Pharmacist Clinician (PhC)/ Clinical Pharmacy Specialist

## 2013-11-29 ENCOUNTER — Ambulatory Visit (INDEPENDENT_AMBULATORY_CARE_PROVIDER_SITE_OTHER): Payer: Medicare Other | Admitting: Internal Medicine

## 2013-11-29 ENCOUNTER — Ambulatory Visit: Payer: Medicare Other | Admitting: Cardiology

## 2013-11-29 DIAGNOSIS — I4891 Unspecified atrial fibrillation: Secondary | ICD-10-CM

## 2013-11-29 LAB — POCT INR: INR: 1.3

## 2013-12-05 ENCOUNTER — Ambulatory Visit (INDEPENDENT_AMBULATORY_CARE_PROVIDER_SITE_OTHER): Payer: Medicare Other | Admitting: Internal Medicine

## 2013-12-05 DIAGNOSIS — I4891 Unspecified atrial fibrillation: Secondary | ICD-10-CM

## 2013-12-05 LAB — POCT INR: INR: 2.2

## 2013-12-12 LAB — POCT INR: INR: 4.2

## 2013-12-13 ENCOUNTER — Ambulatory Visit (INDEPENDENT_AMBULATORY_CARE_PROVIDER_SITE_OTHER): Payer: Medicare Other | Admitting: Pharmacist Clinician (PhC)/ Clinical Pharmacy Specialist

## 2013-12-13 DIAGNOSIS — I4891 Unspecified atrial fibrillation: Secondary | ICD-10-CM

## 2013-12-19 ENCOUNTER — Ambulatory Visit (INDEPENDENT_AMBULATORY_CARE_PROVIDER_SITE_OTHER): Payer: Medicare Other | Admitting: Cardiology

## 2013-12-19 DIAGNOSIS — I4891 Unspecified atrial fibrillation: Secondary | ICD-10-CM

## 2013-12-19 LAB — POCT INR: INR: 3

## 2013-12-26 ENCOUNTER — Ambulatory Visit (INDEPENDENT_AMBULATORY_CARE_PROVIDER_SITE_OTHER): Payer: Medicare Other | Admitting: Pharmacist Clinician (PhC)/ Clinical Pharmacy Specialist

## 2013-12-26 ENCOUNTER — Other Ambulatory Visit (HOSPITAL_COMMUNITY): Payer: Self-pay | Admitting: Physician Assistant

## 2013-12-26 DIAGNOSIS — I4891 Unspecified atrial fibrillation: Secondary | ICD-10-CM

## 2013-12-26 LAB — POCT INR: INR: 2.6

## 2014-01-07 ENCOUNTER — Other Ambulatory Visit: Payer: Self-pay | Admitting: Physician Assistant

## 2014-01-17 ENCOUNTER — Ambulatory Visit (INDEPENDENT_AMBULATORY_CARE_PROVIDER_SITE_OTHER): Payer: Medicare Other | Admitting: Internal Medicine

## 2014-01-17 DIAGNOSIS — I4891 Unspecified atrial fibrillation: Secondary | ICD-10-CM

## 2014-01-17 LAB — POCT INR: INR: 4

## 2014-02-01 ENCOUNTER — Other Ambulatory Visit: Payer: Self-pay | Admitting: Physician Assistant

## 2014-02-28 ENCOUNTER — Other Ambulatory Visit: Payer: Self-pay | Admitting: Cardiology

## 2014-02-28 NOTE — Telephone Encounter (Signed)
E sent to [facility]

## 2014-03-06 ENCOUNTER — Ambulatory Visit: Payer: Medicare Other | Admitting: Pharmacist Clinician (PhC)/ Clinical Pharmacy Specialist

## 2014-04-08 ENCOUNTER — Other Ambulatory Visit: Payer: Self-pay | Admitting: Physician Assistant

## 2014-04-10 ENCOUNTER — Encounter (HOSPITAL_COMMUNITY): Payer: Self-pay | Admitting: Cardiology

## 2014-04-10 ENCOUNTER — Emergency Department (HOSPITAL_COMMUNITY): Payer: Medicare Other

## 2014-04-10 ENCOUNTER — Inpatient Hospital Stay (HOSPITAL_COMMUNITY)
Admission: EM | Admit: 2014-04-10 | Discharge: 2014-04-12 | DRG: 308 | Disposition: A | Discharge status: Home / Self Care | Payer: Medicare Other | Attending: Cardiovascular Disease | Admitting: Cardiovascular Disease

## 2014-04-10 DIAGNOSIS — Z87891 Personal history of nicotine dependence: Secondary | ICD-10-CM

## 2014-04-10 DIAGNOSIS — I10 Essential (primary) hypertension: Secondary | ICD-10-CM | POA: Diagnosis present

## 2014-04-10 DIAGNOSIS — I5032 Chronic diastolic (congestive) heart failure: Secondary | ICD-10-CM | POA: Diagnosis present

## 2014-04-10 DIAGNOSIS — F0390 Unspecified dementia without behavioral disturbance: Secondary | ICD-10-CM | POA: Diagnosis present

## 2014-04-10 DIAGNOSIS — Z7901 Long term (current) use of anticoagulants: Secondary | ICD-10-CM | POA: Diagnosis not present

## 2014-04-10 DIAGNOSIS — I25810 Atherosclerosis of coronary artery bypass graft(s) without angina pectoris: Secondary | ICD-10-CM | POA: Diagnosis present

## 2014-04-10 DIAGNOSIS — I480 Paroxysmal atrial fibrillation: Principal | ICD-10-CM | POA: Diagnosis present

## 2014-04-10 DIAGNOSIS — E785 Hyperlipidemia, unspecified: Secondary | ICD-10-CM | POA: Diagnosis present

## 2014-04-10 DIAGNOSIS — I2510 Atherosclerotic heart disease of native coronary artery without angina pectoris: Secondary | ICD-10-CM | POA: Diagnosis present

## 2014-04-10 DIAGNOSIS — Z951 Presence of aortocoronary bypass graft: Secondary | ICD-10-CM

## 2014-04-10 DIAGNOSIS — R319 Hematuria, unspecified: Secondary | ICD-10-CM | POA: Diagnosis present

## 2014-04-10 DIAGNOSIS — I4891 Unspecified atrial fibrillation: Secondary | ICD-10-CM

## 2014-04-10 DIAGNOSIS — I129 Hypertensive chronic kidney disease with stage 1 through stage 4 chronic kidney disease, or unspecified chronic kidney disease: Secondary | ICD-10-CM | POA: Diagnosis present

## 2014-04-10 DIAGNOSIS — I5043 Acute on chronic combined systolic (congestive) and diastolic (congestive) heart failure: Secondary | ICD-10-CM | POA: Diagnosis present

## 2014-04-10 DIAGNOSIS — I428 Other cardiomyopathies: Secondary | ICD-10-CM | POA: Diagnosis present

## 2014-04-10 DIAGNOSIS — I509 Heart failure, unspecified: Secondary | ICD-10-CM | POA: Insufficient documentation

## 2014-04-10 DIAGNOSIS — N183 Chronic kidney disease, stage 3 unspecified: Secondary | ICD-10-CM | POA: Diagnosis present

## 2014-04-10 DIAGNOSIS — Z8249 Family history of ischemic heart disease and other diseases of the circulatory system: Secondary | ICD-10-CM

## 2014-04-10 DIAGNOSIS — G629 Polyneuropathy, unspecified: Secondary | ICD-10-CM

## 2014-04-10 DIAGNOSIS — E876 Hypokalemia: Secondary | ICD-10-CM | POA: Diagnosis not present

## 2014-04-10 DIAGNOSIS — R0602 Shortness of breath: Secondary | ICD-10-CM

## 2014-04-10 DIAGNOSIS — R002 Palpitations: Secondary | ICD-10-CM | POA: Diagnosis present

## 2014-04-10 HISTORY — DX: Chronic combined systolic (congestive) and diastolic (congestive) heart failure: I50.42

## 2014-04-10 LAB — BASIC METABOLIC PANEL
Anion gap: 13 (ref 5–15)
BUN: 13 mg/dL (ref 6–23)
CO2: 22 mmol/L (ref 19–32)
CREATININE: 1.28 mg/dL — AB (ref 0.50–1.10)
Calcium: 9.4 mg/dL (ref 8.4–10.5)
Chloride: 101 mmol/L (ref 96–112)
GFR calc Af Amer: 42 mL/min — ABNORMAL LOW (ref >90)
GFR, EST NON AFRICAN AMERICAN: 36 mL/min — AB (ref >90)
Glucose, Bld: 132 mg/dL — ABNORMAL HIGH (ref 70–99)
Potassium: 3.7 mmol/L (ref 3.5–5.1)
Sodium: 136 mmol/L (ref 135–145)

## 2014-04-10 LAB — TROPONIN I: Troponin I: <0.03 ng/mL (ref <0.031)

## 2014-04-10 LAB — CBC WITH DIFFERENTIAL/PLATELET
BASOS ABS: 0.1 10*3/uL (ref 0.0–0.1)
Basophils Relative: 1 % (ref 0–1)
EOS ABS: 0.1 10*3/uL (ref 0.0–0.7)
Eosinophils Relative: 1 % (ref 0–5)
HCT: 40.6 % (ref 36.0–46.0)
HEMOGLOBIN: 12.9 g/dL (ref 12.0–15.0)
LYMPHS PCT: 21 % (ref 12–46)
Lymphs Abs: 2.3 10*3/uL (ref 0.7–4.0)
MCH: 27.4 pg (ref 26.0–34.0)
MCHC: 31.8 g/dL (ref 30.0–36.0)
MCV: 86.2 fL (ref 78.0–100.0)
Monocytes Absolute: 1 10*3/uL (ref 0.1–1.0)
Monocytes Relative: 9 % (ref 3–12)
Neutro Abs: 7.5 10*3/uL (ref 1.7–7.7)
Neutrophils Relative %: 68 % (ref 43–77)
Platelets: 312 10*3/uL (ref 150–400)
RBC: 4.71 MIL/uL (ref 3.87–5.11)
RDW: 14.3 % (ref 11.5–15.5)
WBC: 11 10*3/uL — ABNORMAL HIGH (ref 4.0–10.5)

## 2014-04-10 LAB — URINALYSIS, ROUTINE W REFLEX MICROSCOPIC
Bilirubin Urine: NEGATIVE
Glucose, UA: NEGATIVE mg/dL
Ketones, ur: 15 mg/dL — AB
NITRITE: NEGATIVE
PH: 5.5 (ref 5.0–8.0)
Protein, ur: 100 mg/dL — AB
Specific Gravity, Urine: 1.012 (ref 1.005–1.030)
Urobilinogen, UA: 1 mg/dL (ref 0.0–1.0)

## 2014-04-10 LAB — TSH: TSH: 3.456 u[IU]/mL (ref 0.350–4.500)

## 2014-04-10 LAB — URINE MICROSCOPIC-ADD ON

## 2014-04-10 LAB — MAGNESIUM: Magnesium: 2 mg/dL (ref 1.5–2.5)

## 2014-04-10 LAB — BRAIN NATRIURETIC PEPTIDE: B Natriuretic Peptide: 413.8 pg/mL — ABNORMAL HIGH (ref 0.0–100.0)

## 2014-04-10 MED ORDER — ENSURE COMPLETE PO LIQD
237.0000 mL | Freq: Two times a day (BID) | ORAL | Status: DC
  Administered 2014-04-11: 237 mL via ORAL

## 2014-04-10 MED ORDER — SODIUM CHLORIDE 0.9 % IJ SOLN
3.0000 mL | INTRAMUSCULAR | Status: DC | PRN
  Administered 2014-04-12: 10 mL via INTRAVENOUS
  Filled 2014-04-10: qty 3

## 2014-04-10 MED ORDER — DILTIAZEM LOAD VIA INFUSION
10.0000 mg | Freq: Once | INTRAVENOUS | Status: AC
  Administered 2014-04-10: 10 mg via INTRAVENOUS
  Filled 2014-04-10: qty 10

## 2014-04-10 MED ORDER — SODIUM CHLORIDE 0.9 % IJ SOLN
3.0000 mL | Freq: Two times a day (BID) | INTRAMUSCULAR | Status: DC
  Administered 2014-04-10: 3 mL via INTRAVENOUS

## 2014-04-10 MED ORDER — DIGOXIN 0.25 MG/ML IJ SOLN
0.2500 mg | Freq: Once | INTRAMUSCULAR | Status: AC
  Administered 2014-04-10: 0.25 mg via INTRAVENOUS
  Filled 2014-04-10: qty 2

## 2014-04-10 MED ORDER — ONDANSETRON HCL 4 MG/2ML IJ SOLN: 4.0000 mg | Freq: Four times a day (QID) | INTRAMUSCULAR | Status: DC | PRN

## 2014-04-10 MED ORDER — DILTIAZEM HCL 100 MG IV SOLR
5.0000 mg/h | INTRAVENOUS | Status: DC
  Administered 2014-04-10: 5 mg/h via INTRAVENOUS

## 2014-04-10 MED ORDER — MEMANTINE HCL 5 MG PO TABS
10.0000 mg | ORAL_TABLET | Freq: Every day | ORAL | Status: DC
Start: 2014-04-10 — End: 2014-04-12
  Administered 2014-04-10 – 2014-04-12 (×3): 10 mg via ORAL
  Filled 2014-04-10: qty 2
  Filled 2014-04-10: qty 1
  Filled 2014-04-10: qty 2

## 2014-04-10 MED ORDER — HYDROCORTISONE 1 % EX CREA
1.0000 "application " | TOPICAL_CREAM | Freq: Three times a day (TID) | CUTANEOUS | Status: DC | PRN
  Filled 2014-04-10: qty 28

## 2014-04-10 MED ORDER — METOPROLOL SUCCINATE ER 25 MG PO TB24
25.0000 mg | ORAL_TABLET | Freq: Every day | ORAL | Status: DC
  Administered 2014-04-10 – 2014-04-12 (×3): 25 mg via ORAL
  Filled 2014-04-10 (×3): qty 1

## 2014-04-10 MED ORDER — RIVAROXABAN 15 MG PO TABS
15.0000 mg | ORAL_TABLET | Freq: Every day | ORAL | Status: DC
  Administered 2014-04-10 – 2014-04-11 (×2): 15 mg via ORAL
  Filled 2014-04-10 (×2): qty 1

## 2014-04-10 MED ORDER — SODIUM CHLORIDE 0.9 % IJ SOLN
3.0000 mL | Freq: Two times a day (BID) | INTRAMUSCULAR | Status: DC
  Administered 2014-04-10 – 2014-04-11 (×2): 3 mL via INTRAVENOUS

## 2014-04-10 MED ORDER — POTASSIUM CHLORIDE CRYS ER 20 MEQ PO TBCR
20.0000 meq | EXTENDED_RELEASE_TABLET | Freq: Every day | ORAL | Status: DC
  Administered 2014-04-10 – 2014-04-12 (×3): 20 meq via ORAL
  Filled 2014-04-10 (×5): qty 1

## 2014-04-10 MED ORDER — SODIUM CHLORIDE 0.9 % IV SOLN: 250.0000 mL | INTRAVENOUS | Status: DC | PRN

## 2014-04-10 MED ORDER — FUROSEMIDE 10 MG/ML IJ SOLN
40.0000 mg | Freq: Once | INTRAMUSCULAR | Status: AC
  Administered 2014-04-10: 40 mg via INTRAVENOUS
  Filled 2014-04-10: qty 4

## 2014-04-10 MED ORDER — DEXTROSE 5 % IV SOLN
5.0000 mg/h | INTRAVENOUS | Status: DC
  Administered 2014-04-10: 12.5 mg/h via INTRAVENOUS
  Administered 2014-04-11 – 2014-04-12 (×3): 10 mg/h via INTRAVENOUS

## 2014-04-10 MED ORDER — FUROSEMIDE 10 MG/ML IJ SOLN
20.0000 mg | Freq: Two times a day (BID) | INTRAMUSCULAR | Status: DC
  Administered 2014-04-11 – 2014-04-12 (×3): 20 mg via INTRAVENOUS
  Filled 2014-04-10 (×3): qty 2

## 2014-04-10 MED ORDER — ACETAMINOPHEN 325 MG PO TABS: 650.0000 mg | ORAL_TABLET | ORAL | Status: DC | PRN

## 2014-04-10 MED ORDER — DILTIAZEM HCL 25 MG/5ML IV SOLN: 10.0000 mg | Freq: Once | INTRAVENOUS | Status: DC

## 2014-04-10 MED ORDER — SODIUM CHLORIDE 0.9 % IV SOLN
250.0000 mL | INTRAVENOUS | Status: DC
  Administered 2014-04-10 – 2014-04-11 (×2): 250 mL via INTRAVENOUS

## 2014-04-10 MED ORDER — RIVAROXABAN 15 MG PO TABS: 15.0000 mg | ORAL_TABLET | Freq: Every day | ORAL | Status: DC

## 2014-04-10 MED ORDER — SODIUM CHLORIDE 0.9 % IJ SOLN: 3.0000 mL | INTRAMUSCULAR | Status: DC | PRN

## 2014-04-10 NOTE — ED Notes (Signed)
Attempted report 

## 2014-04-10 NOTE — ED Notes (Signed)
Sent from dr. Jacquiline Doe office with A Fib, RVR-- heart rate in 160's-170's

## 2014-04-10 NOTE — H&P (Addendum)
Cheryl Zamora is an 81 y.o. female.    Primary Cardiologist:Dr. Martinique PCP: Geoffery Lyons, MD  Chief Complaint:   HPI: 81 y.o. female with a history of CAD and D-CHF. She was hospitalized 11/2012 for CHF and diagnosed with atrial fib, rapid ventricular response. BP limited medications. She had a TEE/DCCV and was discharged in sinus rhythm. She had been on coumadin- now xarelto and low-dose Toprol XL 25 mg daily since then.  Admitted in Sept 2015 in a fib with RVR and dyspnea.  Again had TEE/DCCV to SR.  She had been placed on amiodarone but her Qtc prolonged so it was stopped.  Her CHA2Ds2VASc score is 6 and she is on Xarelto- but held at discharge due to prolonged INR and pt has not been back to see cardiology.   On last TEE EF 25-30%, mod. TR   Today she saw Dr. Reynaldo Minium for episodic a fib, started 4 days ago sometimes fast sometimes not, could not get to MD office yesterday but today in a fib with RVR.  Sent to ER with hope to have DCCV and go home.  She was placed on xarelto Jan 7th and has not missed any doses- though none today.  She could not get to office for coumadin checks so changed to xarelto.  She last ate around 7 am.      Troponin negative no chest pain mild SOB mostly fatigue.   CXR as below.    Past Medical History  Diagnosis Date  . Hyperlipidemia   . Hypertension   . Coronary artery disease 2005    s/p CABG  . History of diastolic dysfunction     well compensated  . Back problem   . Neuropathy   . Atrial fibrillation with rapid ventricular response 11/2012    s/p TEE/DCCV  . CHF (congestive heart failure)   . Dysrhythmia     ATRIAL FIB WITH RVR  . Arthritis     BACK OSTEO ARTHRITIS    Past Surgical History  Procedure Laterality Date  . Cardiac catheterization  2005    LMain 60, LAD 90, D1 30, CFX 95->T, RCA OK  . Coronary artery bypass graft  2005    x 4; LIMA-LAD, SVG-D1, SVG-OM1-OM2  . Other surgical history      hysterectomy  . Other  surgical history      tonsilectomy  . Back surgery      "neck sugery in 60's"  . Tee without cardioversion N/A 12/13/2012    Procedure: TRANSESOPHAGEAL ECHOCARDIOGRAM (TEE);  Surgeon: Larey Dresser, MD;  Location: [facility];  Service: Cardiovascular;  Laterality: N/A;  . Cardioversion N/A 12/13/2012    Procedure: CARDIOVERSION;  Surgeon: Larey Dresser, MD;  Location: [place];  Service: Cardiovascular;  Laterality: N/A;  . Tee without cardioversion N/A 11/13/2013    Procedure: TRANSESOPHAGEAL ECHOCARDIOGRAM (TEE);  Surgeon: Shilah Spark, MD;  Location: [facility];  Service: Cardiovascular;  Laterality: N/A;  . Cardioversion N/A 11/13/2013    Procedure: CARDIOVERSION;  Surgeon: Valissa Spark, MD;  Location: [facility] ENDOSCOPY;  Service: Cardiovascular;  Laterality: N/A;    Family History  Problem Relation Age of Onset  . Heart attack Mother   . Heart attack Father   . Heart attack Brother   . Heart attack Brother   . Heart attack Brother   . Heart attack Brother    Social History:  reports that she quit smoking about 11 years  ago. She has never used smokeless tobacco. She reports that she does not drink alcohol or use illicit drugs.  Allergies:  Allergies  Allergen Reactions  . Amiodarone     Excessive QT prolongation, nausea  . Codeine Nausea And Vomiting    OUTPATIENT MEDICATIONS: No current facility-administered medications on file prior to encounter.   Current Outpatient Prescriptions on File Prior to Encounter  Medication Sig Dispense Refill  . furosemide (LASIX) 40 MG tablet TAKE 1 TABLET BY MOUTH EVERY DAY 30 tablet 6  . memantine (NAMENDA) 10 MG tablet Take 10 mg by mouth daily.     . metoprolol succinate (TOPROL-XL) 25 MG 24 hr tablet TAKE 1 TABLET BY MOUTH EVERY DAY 30 tablet 0  . potassium chloride SA (K-DUR,KLOR-CON) 20 MEQ tablet TAKE 1 TABLET BY MOUTH DAILY 30 tablet 2  . warfarin (COUMADIN) 5 MG tablet Take as directed by coumadin clinic (Patient  not taking: Reported on 04/10/2014) 60 tablet 0  NO LONGER ON COUMADIN  Xarelto   Results for orders placed or performed during the hospital encounter of 04/10/14 (from the past 48 hour(s))  CBC with Differential/Platelet     Status: Abnormal   Collection Time: 04/10/14  1:37 PM  Result Value Ref Range   WBC 11.0 (H) 4.0 - 10.5 K/uL   RBC 4.71 3.87 - 5.11 MIL/uL   Hemoglobin 12.9 12.0 - 15.0 g/dL   HCT 40.6 36.0 - 46.0 %   MCV 86.2 78.0 - 100.0 fL   MCH 27.4 26.0 - 34.0 pg   MCHC 31.8 30.0 - 36.0 g/dL   RDW 14.3 11.5 - 15.5 %   Platelets 312 150 - 400 K/uL   Neutrophils Relative % 68 43 - 77 %   Neutro Abs 7.5 1.7 - 7.7 K/uL   Lymphocytes Relative 21 12 - 46 %   Lymphs Abs 2.3 0.7 - 4.0 K/uL   Monocytes Relative 9 3 - 12 %   Monocytes Absolute 1.0 0.1 - 1.0 K/uL   Eosinophils Relative 1 0 - 5 %   Eosinophils Absolute 0.1 0.0 - 0.7 K/uL   Basophils Relative 1 0 - 1 %   Basophils Absolute 0.1 0.0 - 0.1 K/uL  Basic metabolic panel     Status: Abnormal   Collection Time: 04/10/14  1:37 PM  Result Value Ref Range   Sodium 136 135 - 145 mmol/L   Potassium 3.7 3.5 - 5.1 mmol/L   Chloride 101 96 - 112 mmol/L   CO2 22 19 - 32 mmol/L   Glucose, Bld 132 (H) 70 - 99 mg/dL   BUN 13 6 - 23 mg/dL   Creatinine, Ser 1.28 (H) 0.50 - 1.10 mg/dL   Calcium 9.4 8.4 - 10.5 mg/dL   GFR calc non Af Amer 36 (L) >90 mL/min   GFR calc Af Amer 42 (L) >90 mL/min    Comment: (NOTE) The eGFR has been calculated using the CKD EPI equation. This calculation has not been validated in all clinical situations. eGFR's persistently <90 mL/min signify possible Chronic Kidney Disease.    Anion gap 13 5 - 15  Troponin I     Status: None   Collection Time: 04/10/14  1:37 PM  Result Value Ref Range   Troponin I <0.03 <0.031 ng/mL    Comment:        NO INDICATION OF MYOCARDIAL INJURY.   Brain natriuretic peptide     Status: Abnormal   Collection Time: 04/10/14  1:37 PM  Result  Value Ref Range   B  Natriuretic Peptide 413.8 (H) 0.0 - 100.0 pg/mL   Dg Chest Port 1 View  04/10/2014   CLINICAL DATA:  Short of breath, congestive heart failure  EXAM: PORTABLE CHEST - 1 VIEW  COMPARISON:  Radiograph 11/12/2013  FINDINGS: Sternotomy wires overlie stable cardiac silhouette. The is subtle airspace disease in the left upper lobe. Mild bibasilar atelectasis.  IMPRESSION: Subtle airspace disease in the left upper lobe is concerning for pneumonia or asymmetric edema. Recommend appropriate therapy and follow-up radiography to ensure resolution.   Electronically Signed   By: Suzy Bouchard M.D.   On: 04/10/2014 14:28    ROS: General:no colds or fevers, no weight changes Skin:no rashes or ulcers HEENT:no blurred vision, no congestion CV:see HPI PUL:see HPI GI:no diarrhea constipation or melena, no indigestion GU:no hematuria- though urine dark brownish, no dysuria MS:no joint pain, no claudication Neuro:no syncope, no lightheadedness, hx of some dementia Endo:no diabetes, no thyroid disease   Blood pressure 107/78, pulse 105, temperature 98 F (36.7 C), temperature source Oral, resp. rate 20, SpO2 95 %. PE: General:Pleasant affect, NAD, currentl;y not aware of rapid HR  Skin:Warm and dry, brisk capillary refill HEENT:normocephalic, sclera clear, mucus membranes moist Neck:supple, no JVD- sitting upright, no bruits  Heart:irreg irreg  without murmur, gallup, rub or click Lungs: with rales and rhonchi, no wheezes PQD:IYME, non tender, + BS, do not palpate liver spleen or masses Ext:1-2+ lower ext edema, 2+ pedal pulses, 2+ radial pulses Neuro:alert and oriented, MAE, follows commands, + facial symmetry    Assessment/Plan Principal Problem:   Atrial fibrillation with rapid ventricular response- on IV dilt after 10 mg bolus and drip at 10, while will need DCCV most likely with edema on CXR and late in day do not think we can safely cardiovert today.  Would prefer to admit and if still in a fib  after diuresing then DCCV in AM.  Dr. Angelena Form talking to pt and family. Admit to step down  Active Problems:   Acute HF combination of systolic and diastolic with last EF 15-83% though this is related to HR most likely, needs echo repeated in SR.  Will give IV lasix now.     Acute on Chronic diastolic CHF (congestive heart failure), NYHA class 3    Hypertension now low with drip and in a fib.      CAD (coronary artery disease) of artery bypass graft 2012 with LIMA to LAD and VG to 1st diag.  Seq vg to OM 1&2- no chest pain since     Long term current use of anticoagulant therapy- now on Xarelto     CKD [place] Practitioner Certified [place] Pager [phone] or after 5pm or weekends call [phone] 04/10/2014, 2:59 PM   I have personally seen and examined this patient with Cecilie Kicks, NP.  I agree with the assessment and plan as outlined above. She has rapid atrial fibrillation. She is asking for cardioversion in the ED but she had breakfast this am and is high risk for DCCV given age. I think it would be better to admit and plan DCCV tomorrow. HR is better controlled on diltiazem but still 120. Will titrate diltiazem for better rate control. Will add digoxin. Will admit to stepdown. She has been on Xarelto for anti-coagulation for last 5 weeks so she will not need TEE guided DCCV. She is also volume overloaded. Will start IV Lasix. EP to  see tomorrow. She has failed amiodarone therapy recently. Will need guidance on best discharge medication post cardioversion.   Ethelreda Sukhu 04/10/2014 4:10 PM

## 2014-04-10 NOTE — ED Notes (Signed)
Cardiologist at bedside.  

## 2014-04-10 NOTE — ED Notes (Signed)
Rudi Heap- healthcare power of attorney. Would like to to be updated when patient gets a bed.  Cell: [phone]

## 2014-04-10 NOTE — ED Provider Notes (Addendum)
CSN: [id number]     Arrival date & time 04/10/14  1258 History   First MD Initiated Contact with Patient 04/10/14 1316     Chief Complaint  Patient presents with  . Atrial Fibrillation     (Consider location/radiation/quality/duration/timing/severity/associated sxs/prior Treatment) HPI Comments: Patient presents to the ER for evaluation of heart palpitations. Patient was sent to the ER by her primary care doctor. Patient does have a history of paroxysmal atrial fibrillation. Patient reports that she started feeling palpitations and rapid heart rate today. She feels slightly short of breath. There is no associated chest pain. Patient has noticed moderate swelling of both of her legs today. This is unusual.   She was seen in her primary care doctor's office, diagnosed with atrial fibrillation with RVR and sent to the ER for cardiology evaluation and treatment.  [name] a 81 y.o. female presenting with atrial fibrillation.  Atrial Fibrillation Associated symptoms include shortness of breath. Pertinent negatives include no chest pain.    Past Medical History  Diagnosis Date  . Hyperlipidemia   . Hypertension   . Coronary artery disease 2005    s/p CABG  . History of diastolic dysfunction     well compensated  . Back problem   . Neuropathy   . Atrial fibrillation with rapid ventricular response 11/2012; 10/2013    s/p TEE/DCCV  . CHF (congestive heart failure)   . Dysrhythmia     ATRIAL FIB WITH RVR  . Arthritis     BACK OSTEO ARTHRITIS   Past Surgical History  Procedure Laterality Date  . Cardiac catheterization  2005    LMain 60, LAD 90, D1 30, CFX 95->T, RCA OK  . Coronary artery bypass graft  2005    x 4; LIMA-LAD, SVG-D1, SVG-OM1-OM2  . Other surgical history      hysterectomy  . Other surgical history      tonsilectomy  . Back surgery      "neck sugery in 60's"  . Tee without cardioversion N/A 12/13/2012    Procedure: TRANSESOPHAGEAL ECHOCARDIOGRAM (TEE);   Surgeon: Larey Dresser, MD;  Location: [place];  Service: Cardiovascular;  Laterality: N/A;  . Cardioversion N/A 12/13/2012    Procedure: CARDIOVERSION;  Surgeon: Larey Dresser, MD;  Location: [place];  Service: Cardiovascular;  Laterality: N/A;  . Tee without cardioversion N/A 11/13/2013    Procedure: TRANSESOPHAGEAL ECHOCARDIOGRAM (TEE);  Surgeon: Natsha Spark, MD;  Location: [place];  Service: Cardiovascular;  Laterality: N/A;  . Cardioversion N/A 11/13/2013    Procedure: CARDIOVERSION;  Surgeon: Fahima Spark, MD;  Location: [facility] ENDOSCOPY;  Service: Cardiovascular;  Laterality: N/A;   Family History  Problem Relation Age of Onset  . Heart attack Mother   . Heart attack Father   . Heart attack Brother   . Heart attack Brother   . Heart attack Brother   . Heart attack Brother    History  Substance Use Topics  . Smoking status: Former Smoker    Quit date: 03/02/2003  . Smokeless tobacco: Never Used  . Alcohol Use: No   OB History    No data available     Review of Systems  Respiratory: Positive for shortness of breath.   Cardiovascular: Positive for palpitations and leg swelling. Negative for chest pain.  All other systems reviewed and are negative.     Allergies  Amiodarone and Codeine  Home Medications   Prior to Admission medications   Medication Sig Start Date End Date  Taking? Authorizing Provider  furosemide (LASIX) 40 MG tablet TAKE 1 TABLET BY MOUTH EVERY DAY 01/08/14  Yes Peter M Martinique, MD  memantine (NAMENDA) 10 MG tablet Take 10 mg by mouth daily.    Yes Historical Provider, MD  metoprolol succinate (TOPROL-XL) 25 MG 24 hr tablet TAKE 1 TABLET BY MOUTH EVERY DAY 04/09/14  Yes Rhonda G Barrett, PA-C  potassium chloride SA (K-DUR,KLOR-CON) 20 MEQ tablet TAKE 1 TABLET BY MOUTH DAILY 02/28/14  Yes Peter M Martinique, MD  Rivaroxaban (XARELTO) 15 MG TABS tablet Take 15 mg by mouth daily.   Yes Historical Provider, MD  warfarin (COUMADIN) 5 MG  tablet Take as directed by coumadin clinic Patient not taking: Reported on 04/10/2014 02/01/14   Peter M Martinique, MD   BP 90/51 mmHg  Pulse 90  Temp(Src) 98 F (36.7 C) (Oral)  Resp 18  SpO2 98% Physical Exam  Constitutional: She is oriented to person, place, and time. She appears well-developed and well-nourished. No distress.  HENT:  Head: Normocephalic and atraumatic.  Right Ear: Hearing normal.  Left Ear: Hearing normal.  Nose: Nose normal.  Mouth/Throat: Oropharynx is clear and moist and mucous membranes are normal.  Eyes: Conjunctivae and EOM are normal. Pupils are equal, round, and reactive to light.  Neck: Normal range of motion. Neck supple.  Cardiovascular: S1 normal and S2 normal.  An irregularly irregular rhythm present. Tachycardia present.  Exam reveals no gallop and no friction rub.   No murmur heard. Pulmonary/Chest: Effort normal and breath sounds normal. No respiratory distress. She exhibits no tenderness.  Abdominal: Soft. Normal appearance and bowel sounds are normal. There is no hepatosplenomegaly. There is no tenderness. There is no rebound, no guarding, no tenderness at McBurney's point and negative Murphy's sign. No hernia.  Musculoskeletal: Normal range of motion. She exhibits edema.  Neurological: She is alert and oriented to person, place, and time. She has normal strength. No cranial nerve deficit or sensory deficit. Coordination normal. GCS eye subscore is 4. GCS verbal subscore is 5. GCS motor subscore is 6.  Skin: Skin is warm, dry and intact. No rash noted. No cyanosis.  Psychiatric: She has a normal mood and affect. Her speech is normal and behavior is normal. Thought content normal.  Nursing note and vitals reviewed.   ED Course  Procedures (including critical care time) Labs Review Labs Reviewed  CBC WITH DIFFERENTIAL/PLATELET - Abnormal; Notable for the following:    WBC 11.0 (*)    All other components within normal limits  BASIC METABOLIC PANEL  - Abnormal; Notable for the following:    Glucose, Bld 132 (*)    Creatinine, Ser 1.28 (*)    GFR calc non Af Amer 36 (*)    GFR calc Af Amer 42 (*)    All other components within normal limits  BRAIN NATRIURETIC PEPTIDE - Abnormal; Notable for the following:    B Natriuretic Peptide 413.8 (*)    All other components within normal limits  TROPONIN I  URINALYSIS, ROUTINE W REFLEX MICROSCOPIC    Imaging Review Dg Chest Port 1 View  04/10/2014   CLINICAL DATA:  Short of breath, congestive heart failure  EXAM: PORTABLE CHEST - 1 VIEW  COMPARISON:  Radiograph 11/12/2013  FINDINGS: Sternotomy wires overlie stable cardiac silhouette. The is subtle airspace disease in the left upper lobe. Mild bibasilar atelectasis.  IMPRESSION: Subtle airspace disease in the left upper lobe is concerning for pneumonia or asymmetric edema. Recommend appropriate therapy and follow-up radiography  to ensure resolution.   Electronically Signed   By: Suzy Bouchard M.D.   On: 04/10/2014 14:28     EKG Interpretation   Date/Time:  Wednesday April 10 2014 13:10:24 EST Ventricular Rate:  167 PR Interval:    QRS Duration: 74 QT Interval:  262 QTC Calculation: 437 R Axis:   81 Text Interpretation:  Atrial fibrillation with rapid ventricular response  Low voltage QRS Cannot rule out Anteroseptal infarct , age undetermined  Abnormal ECG Confirmed by POLLINA  MD, CHRISTOPHER [phone]) on 04/10/2014  1:35:47 PM      MDM   Final diagnoses:  Shortness of breath  Atrial fibrillation with RVR    Patient presents to the ER for evaluation of atrial fibrillation with rapid ventricular response. Son tells me that the patient has had difficult to treat atrophic fibrillation in the past. This is her third episode. The previous 2 required electrical cardioversion. Patient was initiated on Cardizem. She has not had any significant rate control. Cardiology has been consulted to evaluate the patient for management. Workup  reveals slightly elevated BNP and asymmetric edema on the x-ray. Patient does have significant swelling of both lower extremities which began today, likely indicating early CHF.    Orpah Greek, MD 04/10/14 [place], MD 04/10/14 [phone]

## 2014-04-11 ENCOUNTER — Encounter (HOSPITAL_COMMUNITY): Payer: Self-pay | Admitting: *Deleted

## 2014-04-11 DIAGNOSIS — N183 Chronic kidney disease, stage 3 unspecified: Secondary | ICD-10-CM | POA: Diagnosis present

## 2014-04-11 DIAGNOSIS — I2510 Atherosclerotic heart disease of native coronary artery without angina pectoris: Secondary | ICD-10-CM | POA: Diagnosis present

## 2014-04-11 DIAGNOSIS — I5023 Acute on chronic systolic (congestive) heart failure: Secondary | ICD-10-CM

## 2014-04-11 LAB — TROPONIN I

## 2014-04-11 LAB — BASIC METABOLIC PANEL
Anion gap: 7 (ref 5–15)
Anion gap: 6 (ref 5–15)
BUN: 10 mg/dL (ref 6–23)
BUN: 14 mg/dL (ref 6–23)
CHLORIDE: 106 mmol/L (ref 96–112)
CO2: 25 mmol/L (ref 19–32)
CO2: 31 mmol/L (ref 19–32)
CREATININE: 1.44 mg/dL — AB (ref 0.50–1.10)
Calcium: 8.6 mg/dL (ref 8.4–10.5)
Calcium: 9.3 mg/dL (ref 8.4–10.5)
Chloride: 102 mmol/L (ref 96–112)
Creatinine, Ser: 1.16 mg/dL — ABNORMAL HIGH (ref 0.50–1.10)
GFR calc Af Amer: 47 mL/min — ABNORMAL LOW (ref >90)
GFR calc Af Amer: 36 mL/min — ABNORMAL LOW (ref >90)
GFR calc non Af Amer: 31 mL/min — ABNORMAL LOW (ref >90)
GFR, EST NON AFRICAN AMERICAN: 41 mL/min — AB (ref >90)
GLUCOSE: 133 mg/dL — AB (ref 70–99)
Glucose, Bld: 106 mg/dL — ABNORMAL HIGH (ref 70–99)
Potassium: 3.5 mmol/L (ref 3.5–5.1)
Potassium: 4.3 mmol/L (ref 3.5–5.1)
Sodium: 138 mmol/L (ref 135–145)
Sodium: 139 mmol/L (ref 135–145)

## 2014-04-11 LAB — CBC
HEMATOCRIT: 34.5 % — AB (ref 36.0–46.0)
HEMOGLOBIN: 11 g/dL — AB (ref 12.0–15.0)
MCH: 26.9 pg (ref 26.0–34.0)
MCHC: 31.9 g/dL (ref 30.0–36.0)
MCV: 84.4 fL (ref 78.0–100.0)
Platelets: 247 10*3/uL (ref 150–400)
RBC: 4.09 MIL/uL (ref 3.87–5.11)
RDW: 14.4 % (ref 11.5–15.5)
WBC: 9.9 10*3/uL (ref 4.0–10.5)

## 2014-04-11 LAB — MRSA PCR SCREENING: MRSA by PCR: NEGATIVE

## 2014-04-11 LAB — T4, FREE: Free T4: 1.38 ng/dL (ref 0.80–1.80)

## 2014-04-11 MED ORDER — SODIUM CHLORIDE 0.9 % IJ SOLN: 3.0000 mL | Freq: Two times a day (BID) | INTRAMUSCULAR | Status: DC

## 2014-04-11 MED ORDER — DIPHENHYDRAMINE HCL 25 MG PO CAPS
25.0000 mg | ORAL_CAPSULE | Freq: Four times a day (QID) | ORAL | Status: DC | PRN
  Administered 2014-04-11: 25 mg via ORAL
  Filled 2014-04-11: qty 1

## 2014-04-11 MED ORDER — SODIUM CHLORIDE 0.9 % IV SOLN
250.0000 mL | INTRAVENOUS | Status: DC
  Administered 2014-04-12: 250 mL via INTRAVENOUS

## 2014-04-11 MED ORDER — SODIUM CHLORIDE 0.9 % IJ SOLN: 3.0000 mL | INTRAMUSCULAR | Status: DC | PRN

## 2014-04-11 NOTE — Progress Notes (Signed)
Hematuria, pt with Dk urine complaints on admit, + hematuria.  Hx of hematuria in Sept with supra therapeutic INR.  She did not have follow up with cardiology so I do not know if this cleared.  She has had her Xarelto today. Recheck CBC in am.  Check urine culture though nitrites negative on ua.   She was changed to Xarelto from coumadin due to difficulty leaving home for INR checks.  But she may be better served on coumadin with perhaps home health drawing INR.  Will ask Dr. Martinique to weigh in early AM.

## 2014-04-11 NOTE — Consult Note (Signed)
ELECTROPHYSIOLOGY CONSULT NOTE    Patient ID: Cheryl Zamora MRN: [id number], DOB/AGE: May 07, 1923 81 y.o.  Admit date: 04/10/2014 Date of Consult: 04/11/2014  Primary Physician: Geoffery Lyons, MD Primary Cardiologist: [place]  Reason for Consultation: atrial fibrillation  HPI:  Cheryl Zamora is a 81 y.o. female with a past medical history significant for CAD, hypertension, hyperlipidemia, diastolic heart failure, and presumed tachycardia mediated cardiomyopathy.  She was first diagnosed with atrial fibrillation in October of 2014 when she presented from her PCP's office with tachycardia.  She underwent cardioversion at that time. In September of 2015, she had recurrent atrial fibrillation and was admitted.  Amiodarone was started but was discontinued due to significantly prolonged QT interval (>675msec).  She underwent repeat TEE/DCCV at that time and was discharged home on low dose beta blocker.  She did well until the day of admission when she presented to the ER for evaluation of recurrent atrial fibrillation.  She has been anticoagulated with Xarelto and reports compliance. Plans are for repeat cardioversion tomorrow.  EP has been asked to evaluate for treatment options.  Echocardiogram pending this admission.  Last TTE 11/2012 demonstrated EF 40%, diffuse hypokinesis, LA 49.    Lab work this admission is reviewed.  She currently denies chest pain, shortness of breath, LE edema, recent fevers, chills, nausea or vomiting.  She has not had dizziness or pre-syncope.  ROS is otherwise negative.    Past Medical History  Diagnosis Date  . Hyperlipidemia   . Hypertension   . Coronary artery disease 2005    s/p CABG  . History of diastolic dysfunction     well compensated  . Neuropathy   . Atrial fibrillation with rapid ventricular response 11/2012; 10/2013    a. s/p TEE/DCCV 11/2012 & 10/2013 b. amiodarone caused significant QT prlongation (>69msec)  . CHF (congestive heart failure)    . Arthritis     BACK OSTEO ARTHRITIS     Surgical History:  Past Surgical History  Procedure Laterality Date  . Cardiac catheterization  2005    LMain 60, LAD 90, D1 30, CFX 95->T, RCA OK  . Coronary artery bypass graft  2005    x 4; LIMA-LAD, SVG-D1, SVG-OM1-OM2  . Other surgical history      hysterectomy  . Other surgical history      tonsilectomy  . Back surgery      "neck sugery in 60's"  . Tee without cardioversion N/A 12/13/2012    Procedure: TRANSESOPHAGEAL ECHOCARDIOGRAM (TEE);  Surgeon: Larey Dresser, MD;  Location: [place];  Service: Cardiovascular;  Laterality: N/A;  . Cardioversion N/A 12/13/2012    Procedure: CARDIOVERSION;  Surgeon: Larey Dresser, MD;  Location: [place];  Service: Cardiovascular;  Laterality: N/A;  . Tee without cardioversion N/A 11/13/2013    Procedure: TRANSESOPHAGEAL ECHOCARDIOGRAM (TEE);  Surgeon: Lunabelle Spark, MD;  Location: [place];  Service: Cardiovascular;  Laterality: N/A;  . Cardioversion N/A 11/13/2013    Procedure: CARDIOVERSION;  Surgeon: Candice Spark, MD;  Location: [place];  Service: Cardiovascular;  Laterality: N/A;     Prescriptions prior to admission  Medication Sig Dispense Refill Last Dose  . furosemide (LASIX) 40 MG tablet TAKE 1 TABLET BY MOUTH EVERY DAY 30 tablet 6 04/09/2014 at Unknown time  . memantine (NAMENDA) 10 MG tablet Take 10 mg by mouth daily.    04/09/2014 at Unknown time  . metoprolol succinate (TOPROL-XL) 25 MG 24 hr tablet TAKE 1 TABLET BY MOUTH EVERY DAY  30 tablet 0 04/09/2014 at 1400  . potassium chloride SA (K-DUR,KLOR-CON) 20 MEQ tablet TAKE 1 TABLET BY MOUTH DAILY 30 tablet 2 04/09/2014 at Unknown time  . Rivaroxaban (XARELTO) 15 MG TABS tablet Take 15 mg by mouth daily.   04/09/2014 at Unknown time  . warfarin (COUMADIN) 5 MG tablet Take as directed by coumadin clinic (Patient not taking: Reported on 04/10/2014) 60 tablet 0 Not Taking at Unknown time    Inpatient Medications:  .  feeding supplement (ENSURE COMPLETE)  237 mL Oral BID BM  . furosemide  20 mg Intravenous Q12H  . memantine  10 mg Oral Daily  . metoprolol succinate  25 mg Oral Daily  . potassium chloride SA  20 mEq Oral Daily  . rivaroxaban  15 mg Oral Q supper  . sodium chloride  3 mL Intravenous Q12H  . sodium chloride  3 mL Intravenous Q12H    Allergies:  Allergies  Allergen Reactions  . Amiodarone     Excessive QT prolongation, nausea  . Codeine Nausea And Vomiting    History   Social History  . Marital Status: Single    Spouse Name: N/A  . Number of Children: N/A  . Years of Education: N/A   Occupational History  . Retired    Social History Main Topics  . Smoking status: Former Smoker    Quit date: 03/02/2003  . Smokeless tobacco: Never Used  . Alcohol Use: No  . Drug Use: No  . Sexual Activity: Not Currently   Other Topics Concern  . Not on file   Social History Narrative   Pt son helps with her care.     Family History  Problem Relation Age of Onset  . Heart attack Mother   . Heart attack Father   . Heart attack Brother   . Heart attack Brother   . Heart attack Brother   . Heart attack Brother     BP 108/71 mmHg  Pulse 86  Temp(Src) 98.2 F (36.8 C) (Oral)  Resp 23  Ht 5\' 3"  (1.6 m)  Wt 153 lb 12.8 oz (69.763 kg)  BMI 27.25 kg/m2  SpO2 95%  Physical Exam: Well appearing pleasant elderly woman, NAD HEENT: Unremarkable,[place], AT Neck:  6 JVD, no thyromegally Back:  No CVA tenderness Lungs:  Clear with no wheezes, rales, or rhonchi HEART:  IRegular rate rhythm, no murmurs, no rubs, no clicks Abd:  soft, positive bowel sounds, no organomegally, no rebound, no guarding Ext:  2 plus pulses, no edema, no cyanosis, no clubbing Skin:  No rashes no nodules Neuro:  CN II through XII intact, motor grossly intact   Labs:   Lab Results  Component Value Date   WBC 9.9 04/11/2014   HGB 11.0* 04/11/2014   HCT 34.5* 04/11/2014   MCV 84.4 04/11/2014   PLT 247  04/11/2014     Recent Labs Lab 04/11/14 0254  NA 138  K 3.5  CL 106  CO2 25  BUN 10  CREATININE 1.16*  CALCIUM 8.6  GLUCOSE 106*   Lab Results  Component Value Date   TROPONINI <0.03 04/11/2014    Radiology/Studies: Dg Chest Port 1 View 04/10/2014   CLINICAL DATA:  Short of breath, congestive heart failure  EXAM: PORTABLE CHEST - 1 VIEW  COMPARISON:  Radiograph 11/12/2013  FINDINGS: Sternotomy wires overlie stable cardiac silhouette. The is subtle airspace disease in the left upper lobe. Mild bibasilar atelectasis.  IMPRESSION: Subtle airspace disease in the left upper lobe  is concerning for pneumonia or asymmetric edema. Recommend appropriate therapy and follow-up radiography to ensure resolution.   Electronically Signed   By: Suzy Bouchard M.D.   On: 04/10/2014 14:28    EKG: atrial fibrillation,ventricular rate 167   TELEMETRY: atrial fibrillation, ventricular rates 90-120's  A/P 1. Atrial fib with an RVR 2. Possible tachy induced CM 3. QT prolongation on amiodarone 4. Chronic systolic heart failure Rec: the treatment of her atrial fib is difficult based on her multiple comorbidities. I agree with DCCV. If she does not hold NSR, then I would suggest uptitration of AV nodal blocking agents and if still not controlled consider AV node ablation and BiV PPM.   Mikle Bosworth.D.

## 2014-04-11 NOTE — Care Management Note (Unsigned)
    Page 1 of 1   04/11/2014     3:59:25 PM CARE MANAGEMENT NOTE 04/11/2014  Patient:  Cheryl Zamora, Cheryl Zamora   Account Number:  [id number]  Date Initiated:  04/11/2014  Documentation initiated by:  GRAVES-BIGELOW,Hamsa Laurich  Subjective/Objective Assessment:   Pt admitted for Atrial fibrillation with rapid ventricular response placed on IV Cardizem.     Action/Plan:   CM will continue to monitor for disposition needs.   Anticipated DC Date:  04/13/2014   Anticipated DC Plan:  [place]  CM consult      Choice offered to / List presented to:             Status of service:  In process, will continue to follow Medicare Important Message given?   (If response is "NO", the following Medicare IM given date fields will be blank) Date Medicare IM given:   Medicare IM given by:   Date Additional Medicare IM given:   Additional Medicare IM given by:    Discharge Disposition:    Per UR Regulation:  Reviewed for med. necessity/level of care/duration of stay  If discussed at [place] of Stay Meetings, dates discussed:    Comments:

## 2014-04-11 NOTE — Discharge Instructions (Addendum)
Information on my medicine - XARELTO (Rivaroxaban)  This medication education was reviewed with me or my healthcare representative as part of my discharge preparation.  The pharmacist that spoke with me during my hospital stay was:  Wayland Salinas, [facility]  Why was Xarelto prescribed for you? Xarelto was prescribed for you to reduce the risk of a blood clot forming that can cause a stroke if you have a medical condition called atrial fibrillation (a type of irregular heartbeat).  What do you need to know about xarelto ? Take your Xarelto ONCE DAILY at the same time every day with your evening meal. If you have difficulty swallowing the tablet whole, you may crush it and mix in applesauce just prior to taking your dose.  Take Xarelto exactly as prescribed by your doctor and DO NOT stop taking Xarelto without talking to the doctor who prescribed the medication.  Stopping without other stroke prevention medication to take the place of Xarelto may increase your risk of developing a clot that causes a stroke.  Refill your prescription before you run out.  After discharge, you should have regular check-up appointments with your healthcare provider that is prescribing your Xarelto.  In the future your dose may need to be changed if your kidney function or weight changes by a significant amount.  What do you do if you miss a dose? If you are taking Xarelto ONCE DAILY and you miss a dose, take it as soon as you remember on the same day then continue your regularly scheduled once daily regimen the next day. Do not take two doses of Xarelto at the same time or on the same day.   Important Safety Information A possible side effect of Xarelto is bleeding. You should call your healthcare provider right away if you experience any of the following: ? Bleeding from an injury or your nose that does not stop. ? Unusual colored urine (red or dark brown) or unusual colored stools (red or  black). ? Unusual bruising for unknown reasons. ? A serious fall or if you hit your head (even if there is no bleeding).  Some medicines may interact with Xarelto and might increase your risk of bleeding while on Xarelto. To help avoid this, consult your healthcare provider or pharmacist prior to using any new prescription or non-prescription medications, including herbals, vitamins, non-steroidal anti-inflammatory drugs (NSAIDs) and supplements.  This website has more information on Xarelto: [email or web address]   Atrial Fibrillation Atrial fibrillation is a type of irregular heart rhythm (arrhythmia). During atrial fibrillation, the upper chambers of the heart (atria) quiver continuously in a chaotic pattern. This causes an irregular and often rapid heart rate.  Atrial fibrillation is the result of the heart becoming overloaded with disorganized signals that tell it to beat. These signals are normally released one at a time by a part of the right atrium called the sinoatrial node. They then travel from the atria to the lower chambers of the heart (ventricles), causing the atria and ventricles to contract and pump blood as they pass. In atrial fibrillation, parts of the atria outside of the sinoatrial node also release these signals. This results in two problems. First, the atria receive so many signals that they do not have time to fully contract. Second, the ventricles, which can only receive one signal at a time, beat irregularly and out of rhythm with the atria.  There are three types of atrial fibrillation:   Paroxysmal. Paroxysmal atrial fibrillation starts suddenly and stops on  its own within a week.  Persistent. Persistent atrial fibrillation lasts for more than a week. It may stop on its own or with treatment.  Permanent. Permanent atrial fibrillation does not go away. Episodes of atrial fibrillation may lead to permanent atrial fibrillation. Atrial fibrillation can prevent your heart  from pumping blood normally. It increases your risk of stroke and can lead to heart failure.  CAUSES   Heart conditions, including a heart attack, heart failure, coronary artery disease, and heart valve conditions.   Inflammation of the sac that surrounds the heart (pericarditis).  Blockage of an artery in the lungs (pulmonary embolism).  Pneumonia or other infections.  Chronic lung disease.  Thyroid problems, especially if the thyroid is overactive (hyperthyroidism).  Caffeine, excessive alcohol use, and use of some illegal drugs.   Use of some medicines, including certain decongestants and diet pills.  Heart surgery.   Birth defects.  Sometimes, no cause can be found. When this happens, the atrial fibrillation is called lone atrial fibrillation. The risk of complications from atrial fibrillation increases if you have lone atrial fibrillation and you are age 52 years or older. RISK FACTORS  Heart failure.  Coronary artery disease.  Diabetes mellitus.   High blood pressure (hypertension).   Obesity.   Other arrhythmias.   Increased age. SIGNS AND SYMPTOMS   A feeling that your heart is beating rapidly or irregularly.   A feeling of discomfort or pain in your chest.   Shortness of breath.   Sudden light-headedness or weakness.   Getting tired easily when exercising.   Urinating more often than normal (mainly when atrial fibrillation first begins).  In paroxysmal atrial fibrillation, symptoms may start and suddenly stop. DIAGNOSIS  Your health care provider may be able to detect atrial fibrillation when taking your pulse. Your health care provider may have you take a test called an ambulatory electrocardiogram (ECG). An ECG records your heartbeat patterns over a 24-hour period. You may also have other tests, such as:  Transthoracic echocardiogram (TTE). During echocardiography, sound waves are used to evaluate how blood flows through your  heart.  Transesophageal echocardiogram (TEE).  Stress test. There is more than one type of stress test. If a stress test is needed, ask your health care provider about which type is best for you.  Chest X-ray exam.  Blood tests.  Computed tomography (CT). TREATMENT  Treatment may include:  Treating any underlying conditions. For example, if you have an overactive thyroid, treating the condition may correct atrial fibrillation.  Taking medicine. Medicines may be given to control a rapid heart rate or to prevent blood clots, heart failure, or a stroke.  Having a procedure to correct the rhythm of the heart:  Electrical cardioversion. During electrical cardioversion, a controlled, low-energy shock is delivered to the heart through your skin. If you have chest pain, very low blood pressure, or sudden heart failure, this procedure may need to be done as an emergency.  Catheter ablation. During this procedure, heart tissues that send the signals that cause atrial fibrillation are destroyed.  Surgical ablation. During this surgery, thin lines of heart tissue that carry the abnormal signals are destroyed. This procedure can either be an open-heart surgery or a minimally invasive surgery. With the minimally invasive surgery, small cuts are made to access the heart instead of a large opening.  Pulmonary venous isolation. During this surgery, tissue around the veins that carry blood from the lungs (pulmonary veins) is destroyed. This tissue is thought to  carry the abnormal signals. HOME CARE INSTRUCTIONS   Take medicines only as directed by your health care provider. Some medicines can make atrial fibrillation worse or recur.  If blood thinners were prescribed by your health care provider, take them exactly as directed. Too much blood-thinning medicine can cause bleeding. If you take too little, you will not have the needed protection against stroke and other problems.  Perform blood tests at  home if directed by your health care provider. Perform blood tests exactly as directed.  Quit smoking if you smoke.  Do not drink alcohol.  Do not drink caffeinated beverages such as coffee, soda, and some teas. You may drink decaffeinated coffee, soda, or tea.   Maintain a healthy weight.Do not use diet pills unless your health care provider approves. They may make heart problems worse.   Follow diet instructions as directed by your health care provider.  Exercise regularly as directed by your health care provider.  Keep all follow-up visits as directed by your health care provider. This is important. PREVENTION  The following substances can cause atrial fibrillation to recur:   Caffeinated beverages.  Alcohol.  Certain medicines, especially those used for breathing problems.  Certain herbs and herbal medicines, such as those containing ephedra or ginseng.  Illegal drugs, such as cocaine and amphetamines. Sometimes medicines are given to prevent atrial fibrillation from recurring. Proper treatment of any underlying condition is also important in helping prevent recurrence.  SEEK MEDICAL CARE IF:  You notice a change in the rate, rhythm, or strength of your heartbeat.  You suddenly begin urinating more frequently.  You tire more easily when exerting yourself or exercising. SEEK IMMEDIATE MEDICAL CARE IF:   You have chest pain, abdominal pain, sweating, or weakness.  You feel nauseous.  You have shortness of breath.  You suddenly have swollen feet and ankles.  You feel dizzy.  Your face or limbs feel numb or weak.  You have a change in your vision or speech. MAKE SURE YOU:   Understand these instructions.  Will watch your condition.  Will get help right away if you are not doing well or get worse. Document Released: 02/15/2005 Document Revised: 07/02/2013 Document Reviewed: 03/28/2012 [facility] Patient Information 2015 [place], [place]. This information is  not intended to replace advice given to you by your health care provider. Make sure you discuss any questions you have with your health care provider.

## 2014-04-11 NOTE — Progress Notes (Signed)
Subjective: No complaints, color improved, brighter today  Objective: Vital signs in last 24 hours: Temp:  [97.7 F (36.5 C)-98.3 F (36.8 C)] 98.2 F (36.8 C) (02/11 0400) Pulse Rate:  [32-197] 64 (02/11 0400) Resp:  [12-32] 12 (02/11 0400) BP: (89-141)/(46-97) 111/46 mmHg (02/11 0400) SpO2:  [90 %-100 %] 95 % (02/11 0400) Weight:  [153 lb 12.8 oz (69.763 kg)-165 lb 5.5 oz (75 kg)] 153 lb 12.8 oz (69.763 kg) (02/11 0500) Weight change:  Last BM Date: 04/09/14 Intake/Output from previous day: -1419  02/10 0701 - 02/11 0700 In: 426 [P.O.:240; I.V.:186] Out: 1825 [Urine:1825] Intake/Output this shift:    PE: General:Pleasant affect, NAD Skin:Warm and dry, brisk capillary refill HEENT:normocephalic, sclera clear, mucus membranes moist Heart:irreg irreg without murmur, gallup, rub or click Lungs:crackles in bases, improved from yesterday, no rhonchi, or wheezes WJX:BJYN, non tender, + BS, do not palpate liver spleen or masses Ext:tr lower ext edema in ankles, improved from yesterday, 2+ pedal pulses, 2+ radial pulses Neuro:alert and oriented X 3, MAE, follows commands, + facial symmetry Tele: a fib rates mostly in 118-120 but up to 130s at times, on dilt drip    Lab Results:  Recent Labs  04/10/14 1337 04/11/14 0254  WBC 11.0* 9.9  HGB 12.9 11.0*  HCT 40.6 34.5*  PLT 312 247   BMET  Recent Labs  04/10/14 1337 04/11/14 0254  NA 136 138  K 3.7 3.5  CL 101 106  CO2 22 25  GLUCOSE 132* 106*  BUN 13 10  CREATININE 1.28* 1.16*  CALCIUM 9.4 8.6    Recent Labs  04/10/14 2025 04/11/14 0254  TROPONINI <0.03 <0.03    No results found for: CHOL, HDL, LDLCALC, LDLDIRECT, TRIG, CHOLHDL No results found for: HGBA1C   Lab Results  Component Value Date   TSH 3.456 04/10/2014     Studies/Results: Dg Chest Port 1 View  04/10/2014   CLINICAL DATA:  Short of breath, congestive heart failure  EXAM: PORTABLE CHEST - 1 VIEW  COMPARISON:  Radiograph  11/12/2013  FINDINGS: Sternotomy wires overlie stable cardiac silhouette. The is subtle airspace disease in the left upper lobe. Mild bibasilar atelectasis.  IMPRESSION: Subtle airspace disease in the left upper lobe is concerning for pneumonia or asymmetric edema. Recommend appropriate therapy and follow-up radiography to ensure resolution.   Electronically Signed   By: Suzy Bouchard M.D.   On: 04/10/2014 14:28    Medications: I have reviewed the patient's current medications. Scheduled Meds: . feeding supplement (ENSURE COMPLETE)  237 mL Oral BID BM  . furosemide  20 mg Intravenous Q12H  . memantine  10 mg Oral Daily  . metoprolol succinate  25 mg Oral Daily  . potassium chloride SA  20 mEq Oral Daily  . rivaroxaban  15 mg Oral Q supper  . sodium chloride  3 mL Intravenous Q12H  . sodium chloride  3 mL Intravenous Q12H   Continuous Infusions: . sodium chloride 250 mL (04/11/14 0600)  . diltiazem (CARDIZEM) infusion 10 mg/hr (04/11/14 0614)   PRN Meds:.sodium chloride, acetaminophen, hydrocortisone cream, ondansetron (ZOFRAN) IV, sodium chloride, sodium chloride  Assessment/Plan: Principal Problem:   Atrial fibrillation with rapid ventricular response placed on IV dilt and one dose of lanoxin- NPO for DCCV today has been on Xarelto since jan 7, prior to that coumadin unsure if we can arrange DCCV today, pt's POA will be disappointed, hope was to go home ASAP.   Active Problems:  Acute on chronic diastolic HF and due to a fib with RVR   -1200 after 40 IV lasix yesterday for acute CHF in combination with rapid a fib. Now on 20 mg IV every 12 hours     Chronic diastolic CHF (congestive heart failure), NYHA class 3- also on last TEE in Sept 2015 EF 25-30% during rapid a fib.  Plan was to repeat but pt has not followed up. Will order as an outpt     Hypertension controlled,  at times low with dilt drip    CAD (coronary artery disease) hx CABG 2012 no pain since    Long term current  use of anticoagulant therapy on Xarelto      CKD (chronic kidney disease) stage 3, GFR 30-59 ml/min stable Cr 1.16    LOS: 1 day   Time spent with pt. : 15 minutes. [facility] R  Nurse Practitioner Certified Pager [phone] or after 5pm and on weekends call [phone] 04/11/2014, 8:05 AM   Patient seen and examined and history reviewed. Agree with above findings and plan. Difficult situation. Patient admitted with recurrent afib with RVR. Increased CHF. Patient had recurrent afib last in September. TEE at that time showed worsening LV function with EF 25%. Prior EF 40-50% in October 2014. Her afib rate is difficult to control and rate slowing medication limited by low BP. Rate now 100-110 at rest but immediately jumps to 130s just sitting up. She was tried on amiodarone before but developed significantly prolonged QT. We will continue rate control with IV diltiazem, metoprolol, and Dig. Continue Xarelto. Plan DCCV tomorrow 8 am (could not be scheduled today). Continue diuresis. Will update Echo. It appears that AAD therapy is limited due to CAD, LV dysfunction and intolerance to amiodarone. QT prolongation also a concern with Tikosyn. Will ask EP to see. ? If she is a candidate for afib ablation or if we need to consider AV ablation with a Pacemaker.  Allyn Bertoni [place], [place] 04/11/2014 9:47 AM

## 2014-04-11 NOTE — Progress Notes (Signed)
Nutrition Brief Note  Patient identified on the Malnutrition Screening Tool (MST) Report. Weight fluctuations related to fluid status with CHF.  Wt Readings from Last 15 Encounters:  04/11/14 153 lb 12.8 oz (69.763 kg)  11/15/13 165 lb 5.5 oz (75 kg)  05/29/13 161 lb (73.029 kg)  01/09/13 153 lb 1.9 oz (69.455 kg)  12/19/12 154 lb 8.7 oz (70.1 kg)    Body mass index is 27.25 kg/(m^2). Patient meets criteria for overweight based on current BMI.   Current diet order is heart healthy, patient is consuming approximately 100% of meals at this time. Labs and medications reviewed.   No nutrition interventions warranted at this time. If nutrition issues arise, please consult RD.    Molli Barrows, RD, LDN, [place] Pager [phone] After Hours Pager [phone]

## 2014-04-12 ENCOUNTER — Inpatient Hospital Stay (HOSPITAL_COMMUNITY): Payer: Medicare Other | Admitting: Certified Registered"

## 2014-04-12 ENCOUNTER — Encounter (HOSPITAL_COMMUNITY)
Admission: EM | Disposition: A | Discharge status: Home / Self Care | Payer: Self-pay | Source: Home / Self Care | Attending: Cardiovascular Disease

## 2014-04-12 ENCOUNTER — Encounter (HOSPITAL_COMMUNITY): Payer: Self-pay | Admitting: Certified Registered"

## 2014-04-12 DIAGNOSIS — I4891 Unspecified atrial fibrillation: Secondary | ICD-10-CM

## 2014-04-12 DIAGNOSIS — I5043 Acute on chronic combined systolic (congestive) and diastolic (congestive) heart failure: Secondary | ICD-10-CM

## 2014-04-12 DIAGNOSIS — Z7901 Long term (current) use of anticoagulants: Secondary | ICD-10-CM

## 2014-04-12 HISTORY — PX: CARDIOVERSION: SHX1299

## 2014-04-12 LAB — HEMOGLOBIN A1C
Hgb A1c MFr Bld: 5.9 % — ABNORMAL HIGH (ref 4.8–5.6)
Mean Plasma Glucose: 123 mg/dL

## 2014-04-12 LAB — BASIC METABOLIC PANEL
ANION GAP: 6 (ref 5–15)
BUN: 13 mg/dL (ref 6–23)
CO2: 27 mmol/L (ref 19–32)
Calcium: 8.9 mg/dL (ref 8.4–10.5)
Chloride: 104 mmol/L (ref 96–112)
Creatinine, Ser: 1.34 mg/dL — ABNORMAL HIGH (ref 0.50–1.10)
GFR calc Af Amer: 40 mL/min — ABNORMAL LOW (ref >90)
GFR calc non Af Amer: 34 mL/min — ABNORMAL LOW (ref >90)
Glucose, Bld: 108 mg/dL — ABNORMAL HIGH (ref 70–99)
Potassium: 3.4 mmol/L — ABNORMAL LOW (ref 3.5–5.1)
Sodium: 137 mmol/L (ref 135–145)

## 2014-04-12 SURGERY — CARDIOVERSION
Anesthesia: General

## 2014-04-12 MED ORDER — PROPOFOL 10 MG/ML IV BOLUS
INTRAVENOUS | Status: DC | PRN
  Administered 2014-04-12: 50 mg via INTRAVENOUS

## 2014-04-12 MED ORDER — EPHEDRINE SULFATE 50 MG/ML IJ SOLN
INTRAMUSCULAR | Status: DC | PRN
  Administered 2014-04-12: 10 mg via INTRAVENOUS

## 2014-04-12 MED ORDER — LIDOCAINE HCL (CARDIAC) 20 MG/ML IV SOLN
INTRAVENOUS | Status: DC | PRN
  Administered 2014-04-12: 40 mg via INTRAVENOUS

## 2014-04-12 MED ORDER — POTASSIUM CHLORIDE CRYS ER 20 MEQ PO TBCR
40.0000 meq | EXTENDED_RELEASE_TABLET | Freq: Once | ORAL | Status: AC
  Administered 2014-04-12: 40 meq via ORAL

## 2014-04-12 MED ORDER — FUROSEMIDE 40 MG PO TABS
40.0000 mg | ORAL_TABLET | Freq: Every day | ORAL | Status: DC
  Administered 2014-04-12: 40 mg via ORAL
  Filled 2014-04-12: qty 1

## 2014-04-12 NOTE — Progress Notes (Signed)
Reviewed discharge instructions with patient's nephew and he stated his understanding.  Xarelto instructions reviewed.  Patient discharged home via wheelchair with nephew.  Sanda Linger

## 2014-04-12 NOTE — Progress Notes (Signed)
Pharmacist Heart Failure Core Measure Documentation  Assessment: Cheryl Zamora has an EF documented Sept 2015 EF 25-30%  Rationale: Heart failure patients with left ventricular systolic dysfunction (LVSD) and an EF < 40% should be prescribed an angiotensin converting enzyme inhibitor (ACEI) or angiotensin receptor blocker (ARB) at discharge unless a contraindication is documented in the medical record.  This patient is not currently on an ACEI or ARB for HF.  This note is being placed in the record in order to provide documentation that a contraindication to the use of these agents is present for this encounter.  ACE Inhibitor or Angiotensin Receptor Blocker is contraindicated (specify all that apply)  []   ACEI allergy AND ARB allergy []   Angioedema []   Moderate or severe aortic stenosis []   Hyperkalemia []   Hypotension []   Renal artery stenosis [x]   Worsening renal function, preexisting renal disease or dysfunction   Jacon Whetzel S. Alford Highland, PharmD, BCPS Clinical Staff Pharmacist Pager [phone]  Eilene Ghazi [facility] 04/12/2014 11:22 AM

## 2014-04-12 NOTE — CV Procedure (Addendum)
    Cardioversion Note  KAYLYNE AXTON [id number] 1923/12/25  Procedure: DC Cardioversion Indications: atrial fib   Procedure Details Consent: Obtained Time Out: Verified patient identification, verified procedure, site/side was marked, verified correct patient position, special equipment/implants available, Radiology Safety Procedures followed,  medications/allergies/relevent history reviewed, required imaging and test results available.  Performed  The patient has been on adequate anticoagulation.  The patient received IV Lidocaine 40 mg iv followed by Propofol 50 mg iv.   for sedation.  Synchronous cardioversion was performed at 120, 200, 200  joules.  The cardioversion was successful.  She is in a junctional rhythm with occasional P waves.   She required Ephridin 10 mg iv for hypotension / bradycardia after the cardioversion  Complications: No apparent complications Patient did tolerate procedure well.   Will DC Diltiazem   Ramond Dial., MD, [facility] 04/12/2014, 8:34 AM

## 2014-04-12 NOTE — Progress Notes (Signed)
  Echocardiogram 2D Echocardiogram has been performed.  Bobbye Charleston 04/12/2014, 2:30 PM

## 2014-04-12 NOTE — Progress Notes (Signed)
Subjective: No complaints, feels well post DCCV  Objective: Vital signs in last 24 hours: Temp:  [97 F (36.1 C)-98.5 F (36.9 C)] 97.4 F (36.3 C) (02/12 0945) Pulse Rate:  [29-170] 61 (02/12 0945) Resp:  [16-27] 21 (02/12 0905) BP: (88-116)/(39-93) 98/70 mmHg (02/12 0945) SpO2:  [92 %-98 %] 97 % (02/12 0945) Weight:  [164 lb 12.8 oz (74.753 kg)] 164 lb 12.8 oz (74.753 kg) (02/12 0400) Weight change: -8.7 oz (-0.247 kg) Last BM Date: 04/09/14 Intake/Output from previous day: -1419  02/11 0701 - 02/12 0700 In: -  Out: 650 [Urine:650] Intake/Output this shift: Total I/O In: 200 [P.O.:200] Out: 375 [Urine:375]  PE: General:Pleasant affect, NAD Skin:Warm and dry, brisk capillary refill HEENT:normocephalic, sclera clear, mucus membranes moist Heart:irreg irreg without murmur, gallup, rub or click Lungs: clear, no rhonchi, rales, or wheezes CWC:BJSE, non tender, + BS, do not palpate liver spleen or masses Ext:tr lower ext edema in ankles, improved from yesterday, 2+ pedal pulses, 2+ radial pulses Neuro:alert and oriented X 3, MAE, follows commands, + facial symmetry  Tele: sinus brady competing with junctional rhythm. Occ. PVC   Lab Results:  Recent Labs  04/10/14 1337 04/11/14 0254  WBC 11.0* 9.9  HGB 12.9 11.0*  HCT 40.6 34.5*  PLT 312 247   BMET  Recent Labs  04/11/14 1500 04/12/14 0518  NA 139 137  K 4.3 3.4*  CL 102 104  CO2 31 27  GLUCOSE 133* 108*  BUN 14 13  CREATININE 1.44* 1.34*  CALCIUM 9.3 8.9    Recent Labs  04/10/14 2025 04/11/14 0254  TROPONINI <0.03 <0.03    No results found for: CHOL, HDL, LDLCALC, LDLDIRECT, TRIG, CHOLHDL Lab Results  Component Value Date   HGBA1C 5.9* 04/10/2014     Lab Results  Component Value Date   TSH 3.456 04/10/2014     Studies/Results: Dg Chest Port 1 View  04/10/2014   CLINICAL DATA:  Short of breath, congestive heart failure  EXAM: PORTABLE CHEST - 1 VIEW  COMPARISON:  Radiograph  11/12/2013  FINDINGS: Sternotomy wires overlie stable cardiac silhouette. The is subtle airspace disease in the left upper lobe. Mild bibasilar atelectasis.  IMPRESSION: Subtle airspace disease in the left upper lobe is concerning for pneumonia or asymmetric edema. Recommend appropriate therapy and follow-up radiography to ensure resolution.   Electronically Signed   By: Suzy Bouchard M.D.   On: 04/10/2014 14:28    Medications: I have reviewed the patient's current medications. Scheduled Meds: . furosemide  20 mg Intravenous Q12H  . memantine  10 mg Oral Daily  . metoprolol succinate  25 mg Oral Daily  . potassium chloride SA  20 mEq Oral Daily  . rivaroxaban  15 mg Oral Q supper  . sodium chloride  3 mL Intravenous Q12H  . sodium chloride  3 mL Intravenous Q12H  . sodium chloride  3 mL Intravenous Q12H   Continuous Infusions: . sodium chloride 250 mL (04/11/14 0600)  . sodium chloride 250 mL (04/12/14 0627)   PRN Meds:.sodium chloride, acetaminophen, diphenhydrAMINE, hydrocortisone cream, ondansetron (ZOFRAN) IV, sodium chloride, sodium chloride, sodium chloride  Assessment/Plan: Principal Problem:   Atrial fibrillation with rapid ventricular response  Now s/p DCCV. Has junctional rhythm and sinus brady. IV cardizem discontinued. She feels much better today. Will ambulate. If she does well we will DC today on prior metoprolol dose. Appreciate Dr. Tanna Furry input. Not a candidate for AAD therapy. If recurrent AFib try  and rate control but if unable to control will need AV node ablation.  Active Problems:   Acute on chronic diastolic HF and due to a fib with RVR    Negative 2 liters. Will change to po lasix.    Chronic diastolic CHF (congestive heart failure), NYHA class 3- also on last TEE in Sept 2015 EF 25-30% during rapid a fib.  Repeat Echo is pending.     Hypertension controlled,      CAD (coronary artery disease) hx CABG 2012 no pain since    Long term current use of  anticoagulant therapy on Xarelto      CKD (chronic kidney disease) stage 3, GFR 30-59 ml/min stable Cr 1.16    Hematuria. Asymptomatic. UA does not appear infectious. Culture ordered. Will follow.    LOS: 2 days     Octavia Velador [place], [place] 04/12/2014 10:12 AM

## 2014-04-12 NOTE — H&P (View-Only) (Signed)
ELECTROPHYSIOLOGY CONSULT NOTE    Patient ID: Cheryl Zamora MRN: [id number], DOB/AGE: Mar 21, 1923 81 y.o.  Admit date: 04/10/2014 Date of Consult: 04/11/2014  Primary Physician: Geoffery Lyons, MD Primary Cardiologist: [place]  Reason for Consultation: atrial fibrillation  HPI:  Cheryl Zamora is a 81 y.o. female with a past medical history significant for CAD, hypertension, hyperlipidemia, diastolic heart failure, and presumed tachycardia mediated cardiomyopathy.  She was first diagnosed with atrial fibrillation in October of 2014 when she presented from her PCP's office with tachycardia.  She underwent cardioversion at that time. In September of 2015, she had recurrent atrial fibrillation and was admitted.  Amiodarone was started but was discontinued due to significantly prolonged QT interval (>651msec).  She underwent repeat TEE/DCCV at that time and was discharged home on low dose beta blocker.  She did well until the day of admission when she presented to the ER for evaluation of recurrent atrial fibrillation.  She has been anticoagulated with Xarelto and reports compliance. Plans are for repeat cardioversion tomorrow.  EP has been asked to evaluate for treatment options.  Echocardiogram pending this admission.  Last TTE 11/2012 demonstrated EF 40%, diffuse hypokinesis, LA 49.    Lab work this admission is reviewed.  She currently denies chest pain, shortness of breath, LE edema, recent fevers, chills, nausea or vomiting.  She has not had dizziness or pre-syncope.  ROS is otherwise negative.    Past Medical History  Diagnosis Date  . Hyperlipidemia   . Hypertension   . Coronary artery disease 2005    s/p CABG  . History of diastolic dysfunction     well compensated  . Neuropathy   . Atrial fibrillation with rapid ventricular response 11/2012; 10/2013    a. s/p TEE/DCCV 11/2012 & 10/2013 b. amiodarone caused significant QT prlongation (>63msec)  . CHF (congestive heart failure)    . Arthritis     BACK OSTEO ARTHRITIS     Surgical History:  Past Surgical History  Procedure Laterality Date  . Cardiac catheterization  2005    LMain 60, LAD 90, D1 30, CFX 95->T, RCA OK  . Coronary artery bypass graft  2005    x 4; LIMA-LAD, SVG-D1, SVG-OM1-OM2  . Other surgical history      hysterectomy  . Other surgical history      tonsilectomy  . Back surgery      "neck sugery in 60's"  . Tee without cardioversion N/A 12/13/2012    Procedure: TRANSESOPHAGEAL ECHOCARDIOGRAM (TEE);  Surgeon: Larey Dresser, MD;  Location: [place];  Service: Cardiovascular;  Laterality: N/A;  . Cardioversion N/A 12/13/2012    Procedure: CARDIOVERSION;  Surgeon: Larey Dresser, MD;  Location: [place];  Service: Cardiovascular;  Laterality: N/A;  . Tee without cardioversion N/A 11/13/2013    Procedure: TRANSESOPHAGEAL ECHOCARDIOGRAM (TEE);  Surgeon: Jolea Spark, MD;  Location: [place];  Service: Cardiovascular;  Laterality: N/A;  . Cardioversion N/A 11/13/2013    Procedure: CARDIOVERSION;  Surgeon: Ersilia Spark, MD;  Location: [place];  Service: Cardiovascular;  Laterality: N/A;     Prescriptions prior to admission  Medication Sig Dispense Refill Last Dose  . furosemide (LASIX) 40 MG tablet TAKE 1 TABLET BY MOUTH EVERY DAY 30 tablet 6 04/09/2014 at Unknown time  . memantine (NAMENDA) 10 MG tablet Take 10 mg by mouth daily.    04/09/2014 at Unknown time  . metoprolol succinate (TOPROL-XL) 25 MG 24 hr tablet TAKE 1 TABLET BY MOUTH EVERY DAY  30 tablet 0 04/09/2014 at 1400  . potassium chloride SA (K-DUR,KLOR-CON) 20 MEQ tablet TAKE 1 TABLET BY MOUTH DAILY 30 tablet 2 04/09/2014 at Unknown time  . Rivaroxaban (XARELTO) 15 MG TABS tablet Take 15 mg by mouth daily.   04/09/2014 at Unknown time  . warfarin (COUMADIN) 5 MG tablet Take as directed by coumadin clinic (Patient not taking: Reported on 04/10/2014) 60 tablet 0 Not Taking at Unknown time    Inpatient Medications:  .  feeding supplement (ENSURE COMPLETE)  237 mL Oral BID BM  . furosemide  20 mg Intravenous Q12H  . memantine  10 mg Oral Daily  . metoprolol succinate  25 mg Oral Daily  . potassium chloride SA  20 mEq Oral Daily  . rivaroxaban  15 mg Oral Q supper  . sodium chloride  3 mL Intravenous Q12H  . sodium chloride  3 mL Intravenous Q12H    Allergies:  Allergies  Allergen Reactions  . Amiodarone     Excessive QT prolongation, nausea  . Codeine Nausea And Vomiting    History   Social History  . Marital Status: Single    Spouse Name: N/A  . Number of Children: N/A  . Years of Education: N/A   Occupational History  . Retired    Social History Main Topics  . Smoking status: Former Smoker    Quit date: 03/02/2003  . Smokeless tobacco: Never Used  . Alcohol Use: No  . Drug Use: No  . Sexual Activity: Not Currently   Other Topics Concern  . Not on file   Social History Narrative   Pt son helps with her care.     Family History  Problem Relation Age of Onset  . Heart attack Mother   . Heart attack Father   . Heart attack Brother   . Heart attack Brother   . Heart attack Brother   . Heart attack Brother     BP 108/71 mmHg  Pulse 86  Temp(Src) 98.2 F (36.8 C) (Oral)  Resp 23  Ht 5\' 3"  (1.6 m)  Wt 153 lb 12.8 oz (69.763 kg)  BMI 27.25 kg/m2  SpO2 95%  Physical Exam: Well appearing pleasant elderly woman, NAD HEENT: Unremarkable,[place], AT Neck:  6 JVD, no thyromegally Back:  No CVA tenderness Lungs:  Clear with no wheezes, rales, or rhonchi HEART:  IRegular rate rhythm, no murmurs, no rubs, no clicks Abd:  soft, positive bowel sounds, no organomegally, no rebound, no guarding Ext:  2 plus pulses, no edema, no cyanosis, no clubbing Skin:  No rashes no nodules Neuro:  CN II through XII intact, motor grossly intact   Labs:   Lab Results  Component Value Date   WBC 9.9 04/11/2014   HGB 11.0* 04/11/2014   HCT 34.5* 04/11/2014   MCV 84.4 04/11/2014   PLT 247  04/11/2014     Recent Labs Lab 04/11/14 0254  NA 138  K 3.5  CL 106  CO2 25  BUN 10  CREATININE 1.16*  CALCIUM 8.6  GLUCOSE 106*   Lab Results  Component Value Date   TROPONINI <0.03 04/11/2014    Radiology/Studies: Dg Chest Port 1 View 04/10/2014   CLINICAL DATA:  Short of breath, congestive heart failure  EXAM: PORTABLE CHEST - 1 VIEW  COMPARISON:  Radiograph 11/12/2013  FINDINGS: Sternotomy wires overlie stable cardiac silhouette. The is subtle airspace disease in the left upper lobe. Mild bibasilar atelectasis.  IMPRESSION: Subtle airspace disease in the left upper lobe  is concerning for pneumonia or asymmetric edema. Recommend appropriate therapy and follow-up radiography to ensure resolution.   Electronically Signed   By: Suzy Bouchard M.D.   On: 04/10/2014 14:28    EKG: atrial fibrillation,ventricular rate 167   TELEMETRY: atrial fibrillation, ventricular rates 90-120's  A/P 1. Atrial fib with an RVR 2. Possible tachy induced CM 3. QT prolongation on amiodarone 4. Chronic systolic heart failure Rec: the treatment of her atrial fib is difficult based on her multiple comorbidities. I agree with DCCV. If she does not hold NSR, then I would suggest uptitration of AV nodal blocking agents and if still not controlled consider AV node ablation and BiV PPM.   Mikle Bosworth.D.

## 2014-04-12 NOTE — Anesthesia Postprocedure Evaluation (Signed)
  Anesthesia Post-op Note  Patient: Cheryl Zamora  Procedure(s) Performed: Procedure(s): CARDIOVERSION (N/A)  Patient Location: PACU  Anesthesia Type:General  Level of Consciousness: awake and alert   Airway and Oxygen Therapy: Patient Spontanous Breathing  Post-op Pain: none  Post-op Assessment: Post-op Vital signs reviewed, Patient's Cardiovascular Status Stable and Respiratory Function Stable  Post-op Vital Signs: Reviewed  Filed Vitals:   04/12/14 0845  BP: 96/50  Pulse: 56  Temp:   Resp: 18    Complications: No apparent anesthesia complications

## 2014-04-12 NOTE — Interval H&P Note (Signed)
History and Physical Interval Note:  04/12/2014 8:17 AM  Cheryl Zamora  has presented today for surgery, with the diagnosis of a fib  The various methods of treatment have been discussed with the patient and family. After consideration of risks, benefits and other options for treatment, the patient has consented to  Procedure(s): CARDIOVERSION (N/A) as a surgical intervention .  The patient's history has been reviewed, patient examined, no change in status, stable for surgery.  I have reviewed the patient's chart and labs.  Questions were answered to the patient's satisfaction.     [place]ah[place], Wonda Cheng

## 2014-04-12 NOTE — Transfer of Care (Signed)
Immediate Anesthesia Transfer of Care Note  Patient: Cheryl Zamora  Procedure(s) Performed: Procedure(s): CARDIOVERSION (N/A)  Patient Location: Endoscopy Unit  Anesthesia Type:General  Level of Consciousness: sedated  Airway & Oxygen Therapy: Patient Spontanous Breathing and Patient connected to nasal cannula oxygen  Post-op Assessment: Report given to RN, Post -op Vital signs reviewed and stable and Patient moving all extremities  Post vital signs: Reviewed and stable  Last Vitals:  Filed Vitals:   04/12/14 0733  BP: 98/68  Pulse: 106  Temp:   Resp: 25    Complications: No apparent anesthesia complications

## 2014-04-12 NOTE — Anesthesia Preprocedure Evaluation (Addendum)
Anesthesia Evaluation  Patient identified by MRN, date of birth, ID band Patient awake    Reviewed: Allergy & Precautions, H&P , NPO status , Patient's Chart, lab work & pertinent test results, reviewed documented beta blocker date and time   Airway Mallampati: I  TM Distance: >3 FB Neck ROM: Full    Dental no notable dental hx. (+) Teeth Intact, Dental Advisory Given   Pulmonary neg pulmonary ROS, former smoker,  breath sounds clear to auscultation  Pulmonary exam normal       Cardiovascular hypertension, Pt. on medications and Pt. on home beta blockers + CAD and +CHF + dysrhythmias Atrial Fibrillation Rhythm:Irregular Rate:Tachycardia     Neuro/Psych negative neurological ROS  negative psych ROS   GI/Hepatic negative GI ROS, Neg liver ROS,   Endo/Other  negative endocrine ROS  Renal/GU Renal InsufficiencyRenal disease  negative genitourinary   Musculoskeletal  (+) Arthritis -, Osteoarthritis,    Abdominal   Peds  Hematology negative hematology ROS (+)   Anesthesia Other Findings   Reproductive/Obstetrics negative OB ROS                           Anesthesia Physical Anesthesia Plan  ASA: III  Anesthesia Plan: General   Post-op Pain Management:    Induction: Intravenous  Airway Management Planned: Mask  Additional Equipment: None  Intra-op Plan:   Post-operative Plan:   Informed Consent: I have reviewed the patients History and Physical, chart, labs and discussed the procedure including the risks, benefits and alternatives for the proposed anesthesia with the patient or authorized representative who has indicated his/her understanding and acceptance.   Dental advisory given  Plan Discussed with: CRNA, Anesthesiologist and Surgeon  Anesthesia Plan Comments:        Anesthesia Quick Evaluation

## 2014-04-12 NOTE — Discharge Summary (Signed)
Discharge Summary   Patient ID: Cheryl Zamora MRN: [id number], DOB/AGE: [birth year] 81 y.o. Admit date: 04/10/2014 D/C date:     04/12/2014  Primary Cardiologist:  Dr. [place]   Principal Problem:   Atrial fibrillation with rapid ventricular response Active Problems:   Acute on chronic combined systolic and diastolic congestive heart failure   Neuropathy   Chronic diastolic CHF (congestive heart failure), NYHA class 3   Hypertension   Long term current use of anticoagulant therapy   CKD (chronic kidney disease) stage 3, GFR 30-59 ml/min   CAD, multiple vessel, hx CABG 2012    Admission Dates: 04/10/14-04/12/14 Discharge Diagnosis: Atrial fibrillation with RVR s/p successful DCCV on 04/12/14   HPI: Cheryl Zamora is a 81 y.o. female with a history of CAD s/p CABG (2012), HTN, HLD, chronic mixed S/D CHF (EF 25-30%) w/ presumed tachycardia induced CM, CKD, PAF on Xarelto, and mild dementia who presented to [facility] ED on 04/10/14 with recurrent afib with RVR and CHF exacerbation.   She was first diagnosed with atrial fibrillation in October of 2014 when she presented from her PCP's office with tachycardia. She underwent cardioversion at that time. In September of 2015, she had recurrent atrial fibrillation and was admitted. Amiodarone was started but was discontinued due to significantly prolonged QT interval (>635msec). She underwent repeat TEE/DCCV at that time and was discharged home on low dose beta blocker. She did well until the day of admission when she presented to the ER for evaluation of recurrent atrial fibrillation. She has been anticoagulated with Xarelto and reports compliance. Plans are for repeat cardioversion tomorrow. Last TTE 11/2012 demonstrated EF 40%, diffuse hypokinesis, LA 49.   Hospital Course:  Atrial fibrillation with rapid ventricular response  --S/p successful DCCV on 04/12/14. Now has junctional rhythm and sinus brady. IV cardizem discontinued. Continue home  metoprolol dose -- Seen by EP. Appreciate Dr. Tanna Furry input. Not a candidate for AAD therapy. If recurrent AFib try and rate control but if unable to control will need AV node ablation and BiV PPM. -- Chadsvasc at least 5 ( CHF 1, HTN 1, Age 89, Female 1, CAD 1). Continue Xarelto.  Acute on chronic systolic/diastolic HF - Assumed tachycardia induced CM. Last TEE in 12/2013 EF 25-30% during rapid a fib. Repeat Echo today is pending.   -- Diuresed with IV Lasix. Negative 2L. Converted to PO Lasix 40mg  qd.  -- Discharge weight 164 lbs.  -- No ACEi/ARB due to CKD. Continue BB.   Hypertension controlled- continue metoprolol XL 25mg    CAD- hx CABG 2012  -- No s/s of ischemia  CKD stage 3- GFR 30-59 ml/min stable Cr 1.34 today.   Hematuria- Asymptomatic. UA does not appear infectious. Culture ordered. Will follow.  Dementia/Memory loss- continue Namenda  Hypokalemia- mild. 3.4. Will give 29mEq x1 before discharge.   The patient has had an uncomplicated hospital course and is recovering well. She has been seen by Dr. Martinique today and deemed ready for discharge home. All follow-up appointments have been scheduled.  Discharge medications are listed below.   Discharge Vitals: Blood pressure 112/55, pulse 57, temperature 97.8 F (36.6 C), temperature source Oral, resp. rate 21, height 5' 4.17" (1.63 m), weight 164 lb 12.8 oz (74.753 kg), SpO2 100 %.  Labs: Lab Results  Component Value Date   WBC 9.9 04/11/2014   HGB 11.0* 04/11/2014   HCT 34.5* 04/11/2014   MCV 84.4 04/11/2014   PLT 247 04/11/2014  Recent Labs Lab 04/12/14 0518  NA 137  K 3.4*  CL 104  CO2 27  BUN 13  CREATININE 1.34*  CALCIUM 8.9  GLUCOSE 108*    Recent Labs  04/10/14 1337 04/10/14 2025 04/11/14 0254  TROPONINI <0.03 <0.03 <0.03     Diagnostic Studies/Procedures   Dg Chest Port 1 View  04/10/2014   CLINICAL DATA:  Short of breath, congestive heart failure  EXAM: PORTABLE CHEST - 1 VIEW   COMPARISON:  Radiograph 11/12/2013  FINDINGS: Sternotomy wires overlie stable cardiac silhouette. The is subtle airspace disease in the left upper lobe. Mild bibasilar atelectasis.  IMPRESSION: Subtle airspace disease in the left upper lobe is concerning for pneumonia or asymmetric edema. Recommend appropriate therapy and follow-up radiography to ensure resolution.   Electronically Signed   By: Suzy Bouchard M.D.   On: 04/10/2014 14:28    Discharge Medications     Medication List    STOP taking these medications        warfarin 5 MG tablet  Commonly known as:  COUMADIN      TAKE these medications        furosemide 40 MG tablet  Commonly known as:  LASIX  TAKE 1 TABLET BY MOUTH EVERY DAY     memantine 10 MG tablet  Commonly known as:  NAMENDA  Take 10 mg by mouth daily.     metoprolol succinate 25 MG 24 hr tablet  Commonly known as:  TOPROL-XL  TAKE 1 TABLET BY MOUTH EVERY DAY     potassium chloride SA 20 MEQ tablet  Commonly known as:  K-DUR,KLOR-CON  TAKE 1 TABLET BY MOUTH DAILY     Rivaroxaban 15 MG Tabs tablet  Commonly known as:  XARELTO  Take 15 mg by mouth daily.        Disposition   The patient will be discharged in stable condition to home.  Follow-up Information    Follow up with Erlene Quan, PA-C On 05/09/2014.   Specialty:  Cardiology   Why:  @ 9:30am   Contact information:   [address] [place] [address] [phone]         Duration of Discharge Encounter: Greater than 30 minutes including physician and PA time.  Mable Fill R PA-C 04/12/2014, 12:58 PM

## 2014-04-13 LAB — URINE CULTURE

## 2014-04-15 ENCOUNTER — Encounter (HOSPITAL_COMMUNITY): Payer: Self-pay | Admitting: Cardiovascular Disease

## 2014-05-09 ENCOUNTER — Ambulatory Visit (INDEPENDENT_AMBULATORY_CARE_PROVIDER_SITE_OTHER): Payer: Medicare Other | Admitting: Cardiology

## 2014-05-09 ENCOUNTER — Encounter: Payer: Self-pay | Admitting: Cardiology

## 2014-05-09 VITALS — BP 112/72 | HR 55 | Ht 63.0 in | Wt 155.1 lb

## 2014-05-09 DIAGNOSIS — I2510 Atherosclerotic heart disease of native coronary artery without angina pectoris: Secondary | ICD-10-CM

## 2014-05-09 DIAGNOSIS — I480 Paroxysmal atrial fibrillation: Secondary | ICD-10-CM

## 2014-05-09 DIAGNOSIS — I10 Essential (primary) hypertension: Secondary | ICD-10-CM

## 2014-05-09 DIAGNOSIS — I4891 Unspecified atrial fibrillation: Secondary | ICD-10-CM | POA: Diagnosis not present

## 2014-05-09 DIAGNOSIS — I5043 Acute on chronic combined systolic (congestive) and diastolic (congestive) heart failure: Secondary | ICD-10-CM

## 2014-05-09 DIAGNOSIS — N183 Chronic kidney disease, stage 3 unspecified: Secondary | ICD-10-CM

## 2014-05-09 DIAGNOSIS — Z7901 Long term (current) use of anticoagulants: Secondary | ICD-10-CM

## 2014-05-09 NOTE — Assessment & Plan Note (Signed)
Controlled.  

## 2014-05-09 NOTE — Assessment & Plan Note (Signed)
No angina 

## 2014-05-09 NOTE — Progress Notes (Signed)
05/09/2014 [name]   Mar 22, 1923  [id number]  Primary Physician ARONSON,RICHARD A, MD Primary Cardiologist: Dr Martinique  HPI:  81 y.o. 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            She was first diagnosed with atrial fibrillation in October of 2014 when she presented from her PCP's office with tachycardia. She underwent cardioversion at that time. In September of 2015, she had recurrent atrial fibrillation and was admitted. Amiodarone was started but was discontinued due to significantly prolonged QT interval (>643msec). She underwent repeat TEE/DCCV at that time and was discharged home on low dose beta blocker. She did well until the day of admission when she presented to the ER for evaluation of recurrent atrial fibrillation. She underwent DCCV to NSR. She was seen by Dr Lovena Le who suggested she would need AV node RFA and pacemaker if she had recurrent PAF.              She is in the office today for follow up. She has done well since discharge. Her son accompanied her. He checks on her frequently. She denies dyspnea, syncope, or palpitations.   Current Outpatient Prescriptions  Medication Sig Dispense Refill  . furosemide (LASIX) 40 MG tablet TAKE 1 TABLET BY MOUTH EVERY DAY 30 tablet 6  . memantine (NAMENDA) 10 MG tablet Take 10 mg by mouth daily.     . metoprolol succinate (TOPROL-XL) 25 MG 24 hr tablet TAKE 1 TABLET BY MOUTH EVERY DAY 30 tablet 0  . potassium chloride SA (K-DUR,KLOR-CON) 20 MEQ tablet TAKE 1 TABLET BY MOUTH DAILY 30 tablet 2  . Rivaroxaban (XARELTO) 15 MG TABS tablet Take 15 mg by mouth daily.     No current facility-administered medications for this visit.    Allergies  Allergen Reactions  . Amiodarone     Excessive QT  prolongation, nausea  . Codeine Nausea And Vomiting    History   Social History  . Marital Status: Single    Spouse Name: N/A  . Number of Children: N/A  . Years of Education: N/A   Occupational History  . Retired    Social History Main Topics  . Smoking status: Former Smoker    Quit date: 03/02/2003  . Smokeless tobacco: Never Used  . Alcohol Use: No  . Drug Use: No  . Sexual Activity: Not Currently   Other Topics Concern  . Not on file   Social History Narrative   Pt son helps with her care.     Review of Systems: General: negative for chills, fever, night sweats or weight changes.  Cardiovascular: negative for chest pain, dyspnea on exertion, edema, orthopnea, palpitations, paroxysmal nocturnal dyspnea or shortness of breath Dermatological: negative for rash Respiratory: negative for cough or wheezing Urologic: negative for hematuria Abdominal: negative for nausea, vomiting, diarrhea, bright red blood per rectum, melena, or hematemesis Neurologic: negative for visual changes, syncope, or dizziness All other systems reviewed and are otherwise negative except as noted above.    Blood pressure 112/72, pulse 55, height 5\' 3"  (1.6 m), weight 155 lb 1.6 oz (70.353 kg).  General appearance: alert, cooperative and no distress Neck: no carotid bruit and no JVD Lungs: Crackles Lt base, kyphosis Heart:  regular rate and rhythm Extremities: no edema  EKG NSR, SB-57, poor anterior RW  ASSESSMENT AND PLAN:   CAD, multiple vessel, hx CABG 2012 No angina   Acute on chronic combined systolic and diastolic congestive heart failure CHF during recent hospitalization secondary to AF with RVR. Stable today. EF 40 %   Hypertension Controlled   Long term current use of anticoagulant therapy Xarelto   CKD (chronic kidney disease) stage 3, GFR 30-59 ml/min Scr 1.34    PLAN  I offered a f/u in 3 months with Martinique but the son felt she could go 6 months. He'll contact  us if his mother has any problems.  Jatorian Renault KPA-C 05/09/2014 10:03 AM

## 2014-05-09 NOTE — Patient Instructions (Signed)
Your physician wants you to follow-up in: 6 Months with Dr Denzil Hughes will receive a reminder letter in the mail two months in advance. If you don't receive a letter, please call our office to schedule the follow-up appointment.

## 2014-05-09 NOTE — Assessment & Plan Note (Signed)
Scr 1.34

## 2014-05-09 NOTE — Assessment & Plan Note (Signed)
Xarelto

## 2014-05-09 NOTE — Assessment & Plan Note (Signed)
CHF during recent hospitalization secondary to AF with RVR. Stable today. EF 40 %

## 2014-05-13 ENCOUNTER — Other Ambulatory Visit: Payer: Self-pay | Admitting: Physician Assistant

## 2014-06-16 ENCOUNTER — Other Ambulatory Visit: Payer: Self-pay | Admitting: Cardiology

## 2014-08-29 ENCOUNTER — Other Ambulatory Visit: Payer: Self-pay | Admitting: Cardiology

## 2014-08-29 NOTE — Telephone Encounter (Signed)
REFILL 

## 2014-09-30 ENCOUNTER — Other Ambulatory Visit: Payer: Self-pay

## 2014-09-30 ENCOUNTER — Other Ambulatory Visit: Payer: Self-pay | Admitting: Cardiology

## 2014-09-30 MED ORDER — FUROSEMIDE 40 MG PO TABS: 40.0000 mg | ORAL_TABLET | Freq: Every day | ORAL | Status: DC

## 2014-10-26 ENCOUNTER — Other Ambulatory Visit: Payer: Self-pay | Admitting: Cardiology

## 2014-11-20 ENCOUNTER — Other Ambulatory Visit: Payer: Self-pay | Admitting: Cardiology

## 2014-11-20 NOTE — Telephone Encounter (Signed)
Rx has been sent to the pharmacy electronically. ° °

## 2014-12-02 ENCOUNTER — Other Ambulatory Visit: Payer: Self-pay | Admitting: Cardiology

## 2014-12-22 ENCOUNTER — Other Ambulatory Visit: Payer: Self-pay | Admitting: Cardiology

## 2015-01-01 ENCOUNTER — Telehealth: Payer: Self-pay | Admitting: Cardiology

## 2015-01-01 NOTE — Telephone Encounter (Signed)
Spoke to [employer], nephew of patient. He states pt HR up 165 when checked yesterday, 142 today.  She is not SOB, no complaints other than feeling fatigued.  Advised that the HR itself is worrisome and w/ rapid rate best recommendation would be to go to hospital as soon as possible.  He states he thinks pt will be reluctant to go and that he will have to "convince her to go, and talk her into it". i advised as soon as feasible to get her to ED.  Explained that office visit would not be in pt's interest d/t rapid rate - would need IV medications to intervene for arrhythmia correction. Caller voiced understanding. States he will call tomorrow w/ update.

## 2015-01-01 NOTE — Telephone Encounter (Signed)
Patient's son states that the thinks his mother is going in to A-Fib and would like to speak with nurse ASAP. /t g

## 2015-01-02 ENCOUNTER — Telehealth: Payer: Self-pay | Admitting: *Deleted

## 2015-01-02 ENCOUNTER — Encounter (HOSPITAL_COMMUNITY): Payer: Self-pay | Admitting: Emergency Medicine

## 2015-01-02 ENCOUNTER — Emergency Department (HOSPITAL_COMMUNITY): Payer: Medicare Other

## 2015-01-02 ENCOUNTER — Inpatient Hospital Stay (HOSPITAL_COMMUNITY)
Admission: EM | Admit: 2015-01-02 | Discharge: 2015-01-08 | DRG: 872 | Disposition: A | Discharge status: Skilled Nursing Facility | Payer: Medicare Other | Attending: Internal Medicine | Admitting: Internal Medicine

## 2015-01-02 DIAGNOSIS — N183 Chronic kidney disease, stage 3 unspecified: Secondary | ICD-10-CM | POA: Diagnosis present

## 2015-01-02 DIAGNOSIS — Z7901 Long term (current) use of anticoagulants: Secondary | ICD-10-CM

## 2015-01-02 DIAGNOSIS — N179 Acute kidney failure, unspecified: Secondary | ICD-10-CM | POA: Diagnosis present

## 2015-01-02 DIAGNOSIS — Z951 Presence of aortocoronary bypass graft: Secondary | ICD-10-CM | POA: Diagnosis not present

## 2015-01-02 DIAGNOSIS — E871 Hypo-osmolality and hyponatremia: Secondary | ICD-10-CM | POA: Diagnosis not present

## 2015-01-02 DIAGNOSIS — G629 Polyneuropathy, unspecified: Secondary | ICD-10-CM | POA: Diagnosis present

## 2015-01-02 DIAGNOSIS — I129 Hypertensive chronic kidney disease with stage 1 through stage 4 chronic kidney disease, or unspecified chronic kidney disease: Secondary | ICD-10-CM | POA: Diagnosis present

## 2015-01-02 DIAGNOSIS — I10 Essential (primary) hypertension: Secondary | ICD-10-CM | POA: Diagnosis present

## 2015-01-02 DIAGNOSIS — R001 Bradycardia, unspecified: Secondary | ICD-10-CM | POA: Diagnosis not present

## 2015-01-02 DIAGNOSIS — Z87891 Personal history of nicotine dependence: Secondary | ICD-10-CM | POA: Diagnosis not present

## 2015-01-02 DIAGNOSIS — N289 Disorder of kidney and ureter, unspecified: Secondary | ICD-10-CM | POA: Diagnosis not present

## 2015-01-02 DIAGNOSIS — N029 Recurrent and persistent hematuria with unspecified morphologic changes: Secondary | ICD-10-CM | POA: Diagnosis present

## 2015-01-02 DIAGNOSIS — E785 Hyperlipidemia, unspecified: Secondary | ICD-10-CM | POA: Diagnosis present

## 2015-01-02 DIAGNOSIS — E872 Acidosis: Secondary | ICD-10-CM | POA: Diagnosis present

## 2015-01-02 DIAGNOSIS — A419 Sepsis, unspecified organism: Secondary | ICD-10-CM | POA: Diagnosis present

## 2015-01-02 DIAGNOSIS — I5042 Chronic combined systolic (congestive) and diastolic (congestive) heart failure: Secondary | ICD-10-CM | POA: Diagnosis present

## 2015-01-02 DIAGNOSIS — N189 Chronic kidney disease, unspecified: Secondary | ICD-10-CM

## 2015-01-02 DIAGNOSIS — I5032 Chronic diastolic (congestive) heart failure: Secondary | ICD-10-CM | POA: Diagnosis not present

## 2015-01-02 DIAGNOSIS — R319 Hematuria, unspecified: Secondary | ICD-10-CM | POA: Diagnosis not present

## 2015-01-02 DIAGNOSIS — I2510 Atherosclerotic heart disease of native coronary artery without angina pectoris: Secondary | ICD-10-CM | POA: Diagnosis present

## 2015-01-02 DIAGNOSIS — I959 Hypotension, unspecified: Secondary | ICD-10-CM | POA: Diagnosis present

## 2015-01-02 DIAGNOSIS — I480 Paroxysmal atrial fibrillation: Secondary | ICD-10-CM | POA: Diagnosis present

## 2015-01-02 DIAGNOSIS — R109 Unspecified abdominal pain: Secondary | ICD-10-CM

## 2015-01-02 DIAGNOSIS — N390 Urinary tract infection, site not specified: Secondary | ICD-10-CM | POA: Diagnosis present

## 2015-01-02 DIAGNOSIS — A408 Other streptococcal sepsis: Secondary | ICD-10-CM | POA: Diagnosis not present

## 2015-01-02 DIAGNOSIS — I4891 Unspecified atrial fibrillation: Secondary | ICD-10-CM | POA: Diagnosis present

## 2015-01-02 HISTORY — DX: Gastro-esophageal reflux disease without esophagitis: K21.9

## 2015-01-02 HISTORY — DX: Spondylosis, unspecified: M47.9

## 2015-01-02 HISTORY — DX: Spondylosis without myelopathy or radiculopathy, thoracolumbar region: M47.815

## 2015-01-02 LAB — CBC WITH DIFFERENTIAL/PLATELET
Basophils Absolute: 0 10*3/uL (ref 0.0–0.1)
Basophils Relative: 0 %
Eosinophils Absolute: 0.1 10*3/uL (ref 0.0–0.7)
Eosinophils Relative: 0 %
HEMATOCRIT: 38.6 % (ref 36.0–46.0)
Hemoglobin: 12.2 g/dL (ref 12.0–15.0)
LYMPHS ABS: 2.4 10*3/uL (ref 0.7–4.0)
LYMPHS PCT: 14 %
MCH: 25 pg — AB (ref 26.0–34.0)
MCHC: 31.6 g/dL (ref 30.0–36.0)
MCV: 79.1 fL (ref 78.0–100.0)
MONO ABS: 1.3 10*3/uL — AB (ref 0.1–1.0)
Monocytes Relative: 7 %
NEUTROS ABS: 13.5 10*3/uL — AB (ref 1.7–7.7)
Neutrophils Relative %: 79 %
Platelets: 309 10*3/uL (ref 150–400)
RBC: 4.88 MIL/uL (ref 3.87–5.11)
RDW: 16.4 % — ABNORMAL HIGH (ref 11.5–15.5)
WBC: 17.3 10*3/uL — ABNORMAL HIGH (ref 4.0–10.5)

## 2015-01-02 LAB — COMPREHENSIVE METABOLIC PANEL
ALT: 18 U/L (ref 14–54)
AST: 35 U/L (ref 15–41)
Albumin: 3.7 g/dL (ref 3.5–5.0)
Alkaline Phosphatase: 65 U/L (ref 38–126)
Anion gap: 16 — ABNORMAL HIGH (ref 5–15)
BILIRUBIN TOTAL: 1.9 mg/dL — AB (ref 0.3–1.2)
BUN: 25 mg/dL — AB (ref 6–20)
CALCIUM: 8.9 mg/dL (ref 8.9–10.3)
CO2: 16 mmol/L — ABNORMAL LOW (ref 22–32)
CREATININE: 1.84 mg/dL — AB (ref 0.44–1.00)
Chloride: 101 mmol/L (ref 101–111)
GFR, EST AFRICAN AMERICAN: 27 mL/min — AB (ref >60)
GFR, EST NON AFRICAN AMERICAN: 23 mL/min — AB (ref >60)
Glucose, Bld: 156 mg/dL — ABNORMAL HIGH (ref 65–99)
Potassium: 4.2 mmol/L (ref 3.5–5.1)
Sodium: 133 mmol/L — ABNORMAL LOW (ref 135–145)
TOTAL PROTEIN: 6.5 g/dL (ref 6.5–8.1)

## 2015-01-02 LAB — PROTIME-INR
INR: 3.06 — ABNORMAL HIGH (ref 0.00–1.49)
PROTHROMBIN TIME: 31.1 s — AB (ref 11.6–15.2)

## 2015-01-02 LAB — BRAIN NATRIURETIC PEPTIDE: B NATRIURETIC PEPTIDE 5: 622.9 pg/mL — AB (ref 0.0–100.0)

## 2015-01-02 LAB — URINALYSIS, ROUTINE W REFLEX MICROSCOPIC
GLUCOSE, UA: NEGATIVE mg/dL
Ketones, ur: 15 mg/dL — AB
Nitrite: NEGATIVE
Protein, ur: 100 mg/dL — AB
Specific Gravity, Urine: 1.018 (ref 1.005–1.030)
Urobilinogen, UA: 1 mg/dL (ref 0.0–1.0)
pH: 5 (ref 5.0–8.0)

## 2015-01-02 LAB — I-STAT VENOUS BLOOD GAS, ED
ACID-BASE DEFICIT: 5 mmol/L — AB (ref 0.0–2.0)
BICARBONATE: 19.8 meq/L — AB (ref 20.0–24.0)
O2 SAT: 53 %
PCO2 VEN: 36 mmHg — AB (ref 45.0–50.0)
PO2 VEN: 29 mmHg — AB (ref 30.0–45.0)
TCO2: 21 mmol/L (ref 0–100)
pH, Ven: 7.348 — ABNORMAL HIGH (ref 7.250–7.300)

## 2015-01-02 LAB — I-STAT TROPONIN, ED: Troponin i, poc: 0.02 ng/mL (ref 0.00–0.08)

## 2015-01-02 LAB — URINE MICROSCOPIC-ADD ON

## 2015-01-02 LAB — I-STAT CG4 LACTIC ACID, ED
LACTIC ACID, VENOUS: 1.84 mmol/L (ref 0.5–2.0)
Lactic Acid, Venous: 2.86 mmol/L (ref 0.5–2.0)

## 2015-01-02 LAB — PROCALCITONIN

## 2015-01-02 LAB — LIPASE, BLOOD: LIPASE: 43 U/L (ref 11–51)

## 2015-01-02 LAB — APTT: APTT: 43 s — AB (ref 24–37)

## 2015-01-02 LAB — MRSA PCR SCREENING: MRSA by PCR: NEGATIVE

## 2015-01-02 LAB — TROPONIN I: Troponin I: <0.03 ng/mL (ref <0.031)

## 2015-01-02 MED ORDER — CEFTRIAXONE SODIUM 1 G IJ SOLR
1.0000 g | Freq: Once | INTRAMUSCULAR | Status: AC
  Administered 2015-01-02: 1 g via INTRAVENOUS
  Filled 2015-01-02: qty 10

## 2015-01-02 MED ORDER — DILTIAZEM LOAD VIA INFUSION
10.0000 mg | Freq: Once | INTRAVENOUS | Status: AC
  Administered 2015-01-02: 10 mg via INTRAVENOUS
  Filled 2015-01-02: qty 10

## 2015-01-02 MED ORDER — RIVAROXABAN 15 MG PO TABS
15.0000 mg | ORAL_TABLET | Freq: Every day | ORAL | Status: DC
  Administered 2015-01-02 – 2015-01-07 (×6): 15 mg via ORAL
  Filled 2015-01-02 (×6): qty 1

## 2015-01-02 MED ORDER — SODIUM CHLORIDE 0.9 % IV BOLUS (SEPSIS)
500.0000 mL | Freq: Once | INTRAVENOUS | Status: AC
  Administered 2015-01-02: 500 mL via INTRAVENOUS

## 2015-01-02 MED ORDER — ONDANSETRON HCL 4 MG/2ML IJ SOLN
4.0000 mg | Freq: Four times a day (QID) | INTRAMUSCULAR | Status: DC | PRN
  Filled 2015-01-02: qty 2

## 2015-01-02 MED ORDER — DILTIAZEM HCL 100 MG IV SOLR
5.0000 mg/h | INTRAVENOUS | Status: DC
  Administered 2015-01-02: 10 mg/h via INTRAVENOUS
  Administered 2015-01-02: 5 mg/h via INTRAVENOUS
  Administered 2015-01-05: 7.5 mg/h via INTRAVENOUS
  Administered 2015-01-05: 12.5 mg/h via INTRAVENOUS
  Administered 2015-01-05: 7.5 mg/h via INTRAVENOUS
  Filled 2015-01-02 (×9): qty 100

## 2015-01-02 MED ORDER — METRONIDAZOLE IN NACL 5-0.79 MG/ML-% IV SOLN
500.0000 mg | Freq: Once | INTRAVENOUS | Status: AC
  Administered 2015-01-02: 500 mg via INTRAVENOUS
  Filled 2015-01-02: qty 100

## 2015-01-02 MED ORDER — METOPROLOL TARTRATE 25 MG PO TABS
12.5000 mg | ORAL_TABLET | Freq: Two times a day (BID) | ORAL | Status: DC
  Administered 2015-01-02: 12.5 mg via ORAL
  Filled 2015-01-02: qty 1

## 2015-01-02 MED ORDER — ACETAMINOPHEN 325 MG PO TABS
650.0000 mg | ORAL_TABLET | ORAL | Status: DC | PRN
  Administered 2015-01-03: 650 mg via ORAL
  Filled 2015-01-02: qty 2

## 2015-01-02 MED ORDER — DEXTROSE 5 % IV SOLN
1.0000 g | INTRAVENOUS | Status: DC
  Administered 2015-01-03 – 2015-01-08 (×6): 1 g via INTRAVENOUS
  Filled 2015-01-02 (×6): qty 10

## 2015-01-02 MED ORDER — METOPROLOL TARTRATE 12.5 MG HALF TABLET
12.5000 mg | ORAL_TABLET | Freq: Two times a day (BID) | ORAL | Status: DC
  Administered 2015-01-02 – 2015-01-03 (×2): 12.5 mg via ORAL
  Filled 2015-01-02 (×2): qty 1

## 2015-01-02 MED ORDER — SODIUM CHLORIDE 0.9 % IV SOLN
INTRAVENOUS | Status: AC
  Administered 2015-01-02: 14:00:00 via INTRAVENOUS

## 2015-01-02 NOTE — Progress Notes (Addendum)
Pt received from Ed per stretcher Placed in bed. CHG bath done and MRSA swab. Placed on monitor

## 2015-01-02 NOTE — ED Notes (Signed)
Cheryl Zamora Pt's nephew and POA cell phone [phone]

## 2015-01-02 NOTE — ED Provider Notes (Signed)
CSN: [id number]     Arrival date & time 01/02/15  5621 History   First MD Initiated Contact with Patient 01/02/15 0915     Chief Complaint  Patient presents with  . Atrial Fibrillation     (Consider location/radiation/quality/duration/timing/severity/associated sxs/prior Treatment) HPI Comments: Patient with history of recurrent atrial fibrillation requiring cardioversion 3 times in the past presenting with palpitations and lightheadedness and racing heart for the past 3 days. Son has checked heart rate at home and has been consistently in the 160s. She denies any shortness of breath or lightheadedness. She denies any chest pain. She complains of pain in her low back which is a chronic issue. She endorses generalized fatigue and poor appetite. She also today developed some diffuse abdominal pain which is something new. States compliance with her medications including xarelto. Did not take it today. Takes metoprolol 25 mg daily.  [name] a 81 y.o. female presenting with atrial fibrillation. The history is provided by the patient and a relative.  Atrial Fibrillation Associated symptoms include abdominal pain and shortness of breath. Pertinent negatives include no chest pain and no headaches.    Past Medical History  Diagnosis Date  . Hyperlipidemia   . Hypertension   . Coronary artery disease 2005    a. s/p CABG (2012)  . Neuropathy ([place])   . Atrial fibrillation with rapid ventricular response ([place]) 11/2012; 10/2013    a. s/p TEE/DCCV 11/2012 & 10/2013 b. amiodarone caused significant QT prlongation (>666mec)  . Arthritis     BACK OSTEO ARTHRITIS  . Chronic combined systolic and diastolic CHF (congestive heart failure) ([facility]    Past Surgical History  Procedure Laterality Date  . Cardiac catheterization  2005    LMain 60, LAD 90, D1 30, CFX 95->T, RCA OK  . Coronary artery bypass graft  2005    x 4; LIMA-LAD, SVG-D1, SVG-OM1-OM2  . Other surgical history      hysterectomy  .  Other surgical history      tonsilectomy  . Back surgery      "neck sugery in 60's"  . Tee without cardioversion N/A 12/13/2012    Procedure: TRANSESOPHAGEAL ECHOCARDIOGRAM (TEE);  Surgeon: DLarey Dresser MD;  [facility] [facility]  Service: Cardiovascular;  Laterality: N/A;  . Cardioversion N/A 12/13/2012    Procedure: CARDIOVERSION;  Surgeon: DLarey Dresser MD;  [place] [facility]  Service: Cardiovascular;  Laterality: N/A;  . Tee without cardioversion N/A 11/13/2013    Procedure: TRANSESOPHAGEAL ECHOCARDIOGRAM (TEE);  Surgeon: KDorothy Spark MD;  [place] [facility]  Service: Cardiovascular;  Laterality: N/A;  . Cardioversion N/A 11/13/2013    Procedure: CARDIOVERSION;  Surgeon: KDorothy Spark MD;  [place] [facility]  Service: Cardiovascular;  Laterality: N/A;  . Cardioversion N/A 04/12/2014    Procedure: CARDIOVERSION;  Surgeon: PThayer Headings MD;  [facility];  Service: Cardiovascular;  Laterality: N/A;   Family History  Problem Relation Age of Onset  . Heart attack Mother   . Heart attack Father   . Heart attack Brother   . Heart attack Brother   . Heart attack Brother   . Heart attack Brother    Social History  Substance Use Topics  . Smoking status: Former Smoker    Quit date: 03/02/2003  . Smokeless tobacco: Never Used  . Alcohol Use: No   OB History    No data available     Review of Systems  Constitutional: Positive for fatigue. Negative for fever, activity change  and appetite change.  HENT: Negative for congestion and rhinorrhea.   Eyes: Negative for visual disturbance.  Respiratory: Positive for chest tightness and shortness of breath. Negative for cough.   Cardiovascular: Positive for palpitations. Negative for chest pain.  Gastrointestinal: Positive for nausea and abdominal pain.  Endocrine: Negative for polyuria.  Genitourinary: Negative for dysuria, hematuria, vaginal bleeding and vaginal discharge.  Musculoskeletal:  Negative for myalgias and arthralgias.  Skin: Negative for rash.  Neurological: Negative for dizziness, weakness, light-headedness and headaches.   A complete 10 system review of systems was obtained and all systems are negative except as noted in the HPI and PMH.     Allergies  Amiodarone and Codeine  Home Medications   Prior to Admission medications   Medication Sig Start Date End Date Taking? Authorizing Provider  furosemide (LASIX) 40 MG tablet Take 1 tablet (40 mg total) by mouth daily. NEED OV. Patient taking differently: Take 40 mg by mouth daily.  09/30/14  Yes Peter M Martinique, MD  memantine (NAMENDA) 10 MG tablet Take 10 mg by mouth daily.    Yes Historical Provider, MD  metoprolol succinate (TOPROL-XL) 25 MG 24 hr tablet TAKE 1 TABLET BY MOUTH DAILY 12/23/14  Yes Peter M Martinique, MD  potassium chloride SA (K-DUR,KLOR-CON) 20 MEQ tablet TAKE 1 TABLET BY MOUTH DAILY 12/02/14  Yes Peter M Martinique, MD  Rivaroxaban (XARELTO) 15 MG TABS tablet Take 15 mg by mouth daily.   Yes Historical Provider, MD   BP 98/62 mmHg  Pulse 150  Temp(Src) 97.4 F (36.3 C) (Oral)  Resp 14  Ht '5\' 3"'$  (1.6 m)  Wt 150 lb (68.04 kg)  BMI 26.58 kg/m2  SpO2 94% Physical Exam  Constitutional: She is oriented to person, place, and time. She appears well-developed and well-nourished. No distress.  HENT:  Head: Normocephalic and atraumatic.  Mouth/Throat: Oropharynx is clear and moist. No oropharyngeal exudate.  Eyes: Conjunctivae and EOM are normal. Pupils are equal, round, and reactive to light.  Neck: Normal range of motion. Neck supple.  No meningismus.  Cardiovascular: Normal rate, normal heart sounds and intact distal pulses.   No murmur heard. Irregular rhythm  Pulmonary/Chest: Effort normal and breath sounds normal. No respiratory distress.  Abdominal: Soft. There is tenderness. There is no rebound and no guarding.  Mild diffuse tenderness  Musculoskeletal: Normal range of motion. She exhibits no  edema or tenderness.  Neurological: She is alert and oriented to person, place, and time. No cranial nerve deficit. She exhibits normal muscle tone. Coordination normal.  No ataxia on finger to nose bilaterally. No pronator drift. 5/5 strength throughout. CN 2-12 intact. Equal grip strength. Sensation intact.   Skin: Skin is warm.  Psychiatric: She has a normal mood and affect. Her behavior is normal.  Nursing note and vitals reviewed.   ED Course  Procedures (including critical care time) Labs Review Labs Reviewed  CBC WITH DIFFERENTIAL/PLATELET - Abnormal; Notable for the following:    WBC 17.3 (*)    MCH 25.0 (*)    RDW 16.4 (*)    Neutro Abs 13.5 (*)    Monocytes Absolute 1.3 (*)    All other components within normal limits  COMPREHENSIVE METABOLIC PANEL - Abnormal; Notable for the following:    Sodium 133 (*)    CO2 16 (*)    Glucose, Bld 156 (*)    BUN 25 (*)    Creatinine, Ser 1.84 (*)    Total Bilirubin 1.9 (*)    GFR  calc non Af Amer 23 (*)    GFR calc Af Amer 27 (*)    Anion gap 16 (*)    All other components within normal limits  URINALYSIS, ROUTINE W REFLEX MICROSCOPIC (NOT AT [facility]) - Abnormal; Notable for the following:    Color, Urine ORANGE (*)    APPearance TURBID (*)    Hgb urine dipstick LARGE (*)    Bilirubin Urine SMALL (*)    Ketones, ur 15 (*)    Protein, ur 100 (*)    Leukocytes, UA MODERATE (*)    All other components within normal limits  PROTIME-INR - Abnormal; Notable for the following:    Prothrombin Time 31.1 (*)    INR 3.06 (*)    All other components within normal limits  BRAIN NATRIURETIC PEPTIDE - Abnormal; Notable for the following:    B Natriuretic Peptide 622.9 (*)    All other components within normal limits  URINE MICROSCOPIC-ADD ON - Abnormal; Notable for the following:    Bacteria, UA MANY (*)    Casts HYALINE CASTS (*)    All other components within normal limits  LIPASE, BLOOD  TROPONIN I  I-STAT TROPOININ, ED  I-STAT  ARTERIAL BLOOD GAS, ED    Imaging Review Dg Chest Portable 1 View  01/02/2015  CLINICAL DATA:  Chest pain and tachycardia EXAM: PORTABLE CHEST 1 VIEW COMPARISON:  04/10/2014 FINDINGS: Cardiac enlargement with changes of CABG. Negative for heart failure. Lungs are clear without infiltrate or effusion. Negative for pneumonia. IMPRESSION: No active disease. Electronically Signed   By: Franchot Gallo M.D.   On: 01/02/2015 09:41   I have personally reviewed and evaluated these images and lab results as part of my medical decision-making.   EKG Interpretation   Date/Time:  Thursday January 02 2015 09:04:21 EDT Ventricular Rate:  157 PR Interval:    QRS Duration: 93 QT Interval:  313 QTC Calculation: 506 R Axis:   79 Text Interpretation:  Atrial fibrillation with rapid V-rate Low voltage,  extremity and precordial leads Repolarization abnormality, prob rate  related atrial fibrillation with rvr Confirmed by Wyvonnia Dusky  MD, Ketara Cavness  ([id number]) on 01/02/2015 9:39:06 AM      MDM   Final diagnoses:  Abdominal pain   Recurrent atrial fibrillation with heart rate 160s. No chest pain. Blood pressure mental status are stable. Patient states compliance with xarelto.  labs show anion gap acidosis, bicarbonate 16. Urinalysis with hematuria and white blood cells concerning for infection.  Creatinine slightly worse than baseline. Rocephin given.  Lactate and WBC count elevated. Cultures obtained.  Gentle hydration given poor EF and tenuous volume status.  CT obtained with ongoing abdominal pain.  Possible early diverticulitis. Flagyl added.  Cardiology has seen patient. Agrees with cardizem gtt and gentle hydration. D/w Dr. Oval Linsey. M Medical admission dw NP Black.  HR remains elevated 120-130s.    CRITICAL CARE Performed by: Ezequiel Essex Total critical care time: 45 minutes Critical care time was exclusive of separately billable procedures and treating other patients. Critical care was  necessary to treat or prevent imminent or life-threatening deterioration. Critical care was time spent personally by me on the following activities: development of treatment plan with patient and/or surrogate as well as nursing, discussions with consultants, evaluation of patient's response to treatment, examination of patient, obtaining history from patient or surrogate, ordering and performing treatments and interventions, ordering and review of laboratory studies, ordering and review of radiographic studies, pulse oximetry and re-evaluation of patient's condition.  Ezequiel Essex, MD 01/02/15 [phone]

## 2015-01-02 NOTE — H&P (Deleted)
Patient ID: KRISHANA LUTZE MRN: [id number], DOB/AGE: 06/16/1923   Admit date: 01/02/2015   Primary Physician: Geoffery Lyons, MD Primary Cardiologist: Dr. Martinique  Pt. Profile:  81 year old Caucasian female with past medical history of HTN, HLD, CAD s/p CABG 2012, PAF on xarelto, chronic combined systolic and diastolic HF, and CKD stage 3 presented with afib with RVR  Problem List  Past Medical History  Diagnosis Date  . Hyperlipidemia   . Hypertension   . Coronary artery disease 2005    a. s/p CABG (2012)  . Neuropathy ([place])   . Atrial fibrillation with rapid ventricular response ([place]) 11/2012; 10/2013    a. s/p TEE/DCCV 11/2012 & 10/2013 b. amiodarone caused significant QT prlongation (>639mec)  . Arthritis     BACK OSTEO ARTHRITIS  . Chronic combined systolic and diastolic CHF (congestive heart failure) [facility]     Past Surgical History  Procedure Laterality Date  . Cardiac catheterization  2005    LMain 60, LAD 90, D1 30, CFX 95->T, RCA OK  . Coronary artery bypass graft  2005    x 4; LIMA-LAD, SVG-D1, SVG-OM1-OM2  . Other surgical history      hysterectomy  . Other surgical history      tonsilectomy  . Back surgery      "neck sugery in 60's"  . Tee without cardioversion N/A 12/13/2012    Procedure: TRANSESOPHAGEAL ECHOCARDIOGRAM (TEE);  Surgeon: DLarey Dresser MD;  [place]  Service: Cardiovascular;  Laterality: N/A;  . Cardioversion N/A 12/13/2012    Procedure: CARDIOVERSION;  Surgeon: DLarey Dresser MD;  [place]  Service: Cardiovascular;  Laterality: N/A;  . Tee without cardioversion N/A 11/13/2013    Procedure: TRANSESOPHAGEAL ECHOCARDIOGRAM (TEE);  Surgeon: KDorothy Spark MD;  [place]  Service: Cardiovascular;  Laterality: N/A;  . Cardioversion N/A 11/13/2013    Procedure: CARDIOVERSION;  Surgeon: KDorothy Spark MD;  [place]  Service: Cardiovascular;  Laterality: N/A;  . Cardioversion  N/A 04/12/2014    Procedure: CARDIOVERSION;  Surgeon: PThayer Headings MD;  [facility]  Service: Cardiovascular;  Laterality: N/A;     Allergies  Allergies  Allergen Reactions  . Amiodarone     Excessive QT prolongation, nausea  . Codeine Nausea And Vomiting    HPI  The patient is a 81year old Caucasian female with past medical history of HTN, HLD, CAD s/p CABG 2012, PAF on xarelto, chronic combined systolic and diastolic HF, and CKD stage 3. She was first diagnosed with atrial fibrillation in October 2014 when she presented to his PCPs office with tachycardia. She had cardioversion at the time. In September 2015, she had recurrent atrial fibrillation. Amiodarone was started that was later discontinued due to significant QT prolongation (>6055mc).She underwent a repeat TEE DCCV. She was last admitted in February 2016 with A. fib with RVR and acute heart failure in the setting of rapid heart rate. She underwent TEE DC cardioversion on 04/2010. During that hospitalization, patient was seen by Dr. TaLovena Leho suggested that if she does not hold normal sinus rhythm, then would consider up titration of her AV nodal blocking agents, if still not controlled consider AV nodal ablation with BiV PPM. Patient has been doing well until 01/01/2015 when she went back into rapid A. Fib. Her nephew contacted Dr. JoDoug Souffice and was instructed to go to the ED. Apparently, her nephew could not convince the patient to go to the ED on 11/2, however she  eventually agreed to seek medical attention at [facility] ED on 11/3. She denies any shortness of breath or chest pain.  On arrival, significant laboratory finding include creatinine of 1.84 with normally her creatinine is around 1.1-1.3. White blood cell count was elevated at 17.3. BNP was elevated at 622. Troponin negative. Urinalysis was positive for UTI. Chest x-ray was negative for acute process. Patient was noted to be in atrial fibrillation with  RVR on EKG. She was placed on IV diltiazem, up titration of diabetes diltiazem has been limited by her blood pressure. Cardiology has been consulted for A. fib with RVR.  Of note, patient states she has been compliant with Xarelto, her last dose was yesterday morning.   Home Medications  Prior to Admission medications   Medication Sig Start Date End Date Taking? Authorizing Provider  furosemide (LASIX) 40 MG tablet Take 1 tablet (40 mg total) by mouth daily. NEED OV. Patient taking differently: Take 40 mg by mouth daily.  09/30/14  Yes Peter M Martinique, MD  memantine (NAMENDA) 10 MG tablet Take 10 mg by mouth daily.    Yes Historical Provider, MD  metoprolol succinate (TOPROL-XL) 25 MG 24 hr tablet TAKE 1 TABLET BY MOUTH DAILY 12/23/14  Yes Peter M Martinique, MD  potassium chloride SA (K-DUR,KLOR-CON) 20 MEQ tablet TAKE 1 TABLET BY MOUTH DAILY 12/02/14  Yes Peter M Martinique, MD  Rivaroxaban (XARELTO) 15 MG TABS tablet Take 15 mg by mouth daily.   Yes Historical Provider, MD    Family History  Family History  Problem Relation Age of Onset  . Heart attack Mother   . Heart attack Father   . Heart attack Brother   . Heart attack Brother   . Heart attack Brother   . Heart attack Brother     Social History  Social History   Social History  . Marital Status: Single    Spouse Name: N/A  . Number of Children: N/A  . Years of Education: N/A   Occupational History  . Retired    Social History Main Topics  . Smoking status: Former Smoker    Quit date: 03/02/2003  . Smokeless tobacco: Never Used  . Alcohol Use: No  . Drug Use: No  . Sexual Activity: Not Currently   Other Topics Concern  . Not on file   Social History Narrative   Pt son helps with her care.     Review of Systems General:  No chills, fever, night sweats or weight changes.  Cardiovascular:  No chest pain, dyspnea on exertion, edema, orthopnea, paroxysmal nocturnal dyspnea. +palpitations Dermatological: No rash,  lesions/masses Respiratory: No cough, dyspnea Urologic: No hematuria, dysuria Abdominal:   No nausea, vomiting, diarrhea, bright red blood per rectum, melena, or hematemesis Neurologic:  No visual changes, wkns, changes in mental status. All other systems reviewed and are otherwise negative except as noted above.  Physical Exam  Blood pressure 95/62, pulse 159, temperature 97.4 F (36.3 C), temperature source Oral, resp. rate 21, height '5\' 3"'$  (1.6 m), weight 150 lb (68.04 kg), SpO2 93 %.  General: Pleasant, NAD Psych: Normal affect. Neuro: Alert and oriented X 3. Moves all extremities spontaneously. HEENT: Normal  Neck: Supple without bruits or JVD. Lungs:  Resp regular and unlabored, CTA. Heart: RRR no s3, s4, or murmurs. Abdomen: Soft, non-tender, non-distended, BS + x 4.  Extremities: No clubbing, cyanosis or edema. DP/PT/Radials 2+ and equal bilaterally.  Labs  Troponin [facility] of Care Test)  Recent Labs  01/02/15 0919  TROPIPOC 0.02    Recent Labs  01/02/15 0923  TROPONINI <0.03   Lab Results  Component Value Date   WBC 17.3* 01/02/2015   HGB 12.2 01/02/2015   HCT 38.6 01/02/2015   MCV 79.1 01/02/2015   PLT 309 01/02/2015    Recent Labs Lab 01/02/15 0914  NA 133*  K 4.2  CL 101  CO2 16*  BUN 25*  CREATININE 1.84*  CALCIUM 8.9  PROT 6.5  BILITOT 1.9*  ALKPHOS 65  ALT 18  AST 35  GLUCOSE 156*     Radiology/Studies  Dg Chest Portable 1 View  01/02/2015  CLINICAL DATA:  Chest pain and tachycardia EXAM: PORTABLE CHEST 1 VIEW COMPARISON:  04/10/2014 FINDINGS: Cardiac enlargement with changes of CABG. Negative for heart failure. Lungs are clear without infiltrate or effusion. Negative for pneumonia. IMPRESSION: No active disease. Electronically Signed   By: Franchot Gallo M.D.   On: 01/02/2015 09:41    ECG  afib with RVR  Echocardiogram 04/12/2014  LV EF: 45% -   50%  ------------------------------------------------------------------- Indications:   Atrial fibrillation - 427.31.  ------------------------------------------------------------------- History:  PMH:  Coronary artery disease. Congestive heart failure. Risk factors: Hypertension.  ------------------------------------------------------------------- Study Conclusions  - Left ventricle: The cavity size was normal. Wall thickness was increased in a pattern of mild LVH. There was mild focal basal hypertrophy of the septum. Systolic function was mildly reduced. The estimated ejection fraction was in the range of 45% to 50%. Diffuse hypokinesis. - Mitral valve: There was mild regurgitation. - Left atrium: The atrium was mildly dilated. - Right ventricle: The cavity size was severely dilated. - Right atrium: The atrium was severely dilated. - Atrial septum: No defect or patent foramen ovale was identified. - Tricuspid valve: There was moderate regurgitation. - Pulmonary arteries: PA peak pressure: 33 mm Hg (S).    ASSESSMENT AND PLAN  1. PAF on RVR on xarelto  - CHA2DS2-Vasc score 6 (HF, female, age, CAD, HTN)  - continue IV diltiazem, uptitrate if allow. Treat UTI, likely DCCV without TEE once UTI adequately treated  - if BP allow, will add back Toprol XL '25mg'$  daily with addition of low dose PO diltiazem once off IV  - if still having recurrent afib, will need to readdress possible AVN ablation with BiV PPM  2. UTI with elevated WBC  3. Chronic combined systolic and diastolic HF  - Echo 9/47/0761 EF 45-50%, mild LVH, mild MR, severely dilated RV, moderate TR, peak PA pressure 65mHg  - continue to monitor  4. Acute on chronic renal insufficiency   - mild IV hydration, monitor for HF symptom esp since she is in rapid HR  5. CAD s/p CABG 2012: no obvious angina 6. HTN 7. HLD   Signed, MAlmyra Deforest PA-C 01/02/2015, 11:48 AM

## 2015-01-02 NOTE — ED Notes (Signed)
Attempted report x1. 

## 2015-01-02 NOTE — Telephone Encounter (Signed)
Received call this AM from this patient's nephew, [name].  He informs me he did not take pt to ED yesterday, could not convince her to go. She is going this AM.  He is taking for evaluation at [facility] - pt still having rapid rate. Requested I call in advance of their arrival.  I called, left msg for Trish. Will attempt to contact triage RN.

## 2015-01-02 NOTE — ED Notes (Signed)
Pt's HR the past two days has been in 160's. Pt has hx of going into Afib. Pt has been cardioverted in past. Pt denies SOB/lightheadedness. Pt is extremely tired and does not have good appetite. Pt complaining of pain in back and lower abd.

## 2015-01-02 NOTE — Telephone Encounter (Signed)
Spoke to EMT at [facility] ED triage desk - she is aware of pending pt arrival.

## 2015-01-02 NOTE — ED Notes (Signed)
Attempted report x 2 

## 2015-01-02 NOTE — H&P (Signed)
[facility] History and Physical  Cheryl Zamora BOF:[id number] DOB: 1923-09-25 DOA: 01/02/2015  Referring physician: Wyvonnia Dusky PCP: Geoffery Lyons, MD   Chief Complaint: afib  HPI: Cheryl Zamora is a very pleasant, very HOH 81 y.o. female past medical history includes hypertension, CAD status post CABG 2012, recent diagnosis of PAF on Xarelto, chronic combined systolic Heart Failure, Chronic Kidney Disease Stage III Presents Emergency Department Chief Complaint Chest Palpitations. Initial Evaluation in the Emergency Department Reveals A. fib with RVR, Sepsis Related to Urinary Tract Infection and questionable mild diverticulitis. Patient reports her usual state of health until yesterday when she went back into rapid A. fib. Her nephew contacted her cardiologist and they were instructed to come to the emergency department patient resisted and refused to go the ED at that time but came today. She denies any chest pain shortness of breath headache dizziness syncope or near-syncope. She denies any worsening lower extremity edema. She denies abdominal pain nausea vomiting dysuria hematuria frequency or urgency. She denies any diarrhea constipation melena bright red blood per rectum.  Workup in the emergency department included lab work significant for creatinine of 1.84, WBCs 17.3 BNP 622 initial troponin negative. lactid acid 2.86. Urinalysis was + urinary tract infection. Chest x-ray without acute cardiopulmonary process. She was also noted to be in A. fib with RVR on EKG. She was placed on IV diltiazem up titration has been limited due to a soft blood pressure. CT of the abdomen and pelvis concerning for very mild diverticulitis without abscess or perforation.  While in the emergency department blood pressure blood pressure went from 778-24 systolically. Heart rate range was 140-160 respiration rate between 20 and 30 oxygen saturation level greater than 90% on room air. He was provided with 2  500 boluses of normal saline as well as cardizem some loading dose and drip initiated  Review of Systems:  And point review of systems complete and all systems are negative except as indicated in the history of present illness Past Medical History  Diagnosis Date  . Hyperlipidemia   . Hypertension   . Coronary artery disease 2005    a. s/p CABG (2012)  . Neuropathy ([place])   . Atrial fibrillation with rapid ventricular response ([place]) 11/2012; 10/2013    a. s/p TEE/DCCV 11/2012 & 10/2013 b. amiodarone caused significant QT prlongation (>665mec)  . Arthritis     BACK OSTEO ARTHRITIS  . Chronic combined systolic and diastolic CHF (congestive heart failure) ([facility]    Past Surgical History  Procedure Laterality Date  . Cardiac catheterization  2005    LMain 60, LAD 90, D1 30, CFX 95->T, RCA OK  . Coronary artery bypass graft  2005    x 4; LIMA-LAD, SVG-D1, SVG-OM1-OM2  . Other surgical history      hysterectomy  . Other surgical history      tonsilectomy  . Back surgery      "neck sugery in 60's"  . Tee without cardioversion N/A 12/13/2012    Procedure: TRANSESOPHAGEAL ECHOCARDIOGRAM (TEE);  Surgeon: DLarey Dresser MD;  [place]  Service: Cardiovascular;  Laterality: N/A;  . Cardioversion N/A 12/13/2012    Procedure: CARDIOVERSION;  Surgeon: DLarey Dresser MD;  [place]  Service: Cardiovascular;  Laterality: N/A;  . Tee without cardioversion N/A 11/13/2013    Procedure: TRANSESOPHAGEAL ECHOCARDIOGRAM (TEE);  Surgeon: KDorothy Spark MD;  [place]  Service: Cardiovascular;  Laterality: N/A;  . Cardioversion N/A 11/13/2013    Procedure:  CARDIOVERSION;  Surgeon: Quincey Spark, MD;  Location: [place];  Service: Cardiovascular;  Laterality: N/A;  . Cardioversion N/A 04/12/2014    Procedure: CARDIOVERSION;  Surgeon: Thayer Headings, MD;  Location: [facility] END[facility]COPY;  Service: Cardiovascular;  Laterality: N/A;   Social History:  reports  that she quit smoking about 11 years ago. She has never used smokeless tobacco. She reports that she does not drink alcohol or use illicit drugs.  Allergies  Allergen Reactions  . Amiodarone     Excessive QT prolongation, nausea  . Codeine Nausea And Vomiting    Family History  Problem Relation Age of Onset  . Heart attack Mother   . Heart attack Father   . Heart attack Brother   . Heart attack Brother   . Heart attack Brother   . Heart attack Brother      Prior to Admission medications   Medication Sig Start Date End Date Taking? Authorizing Provider  furosemide (LASIX) 40 MG tablet Take 1 tablet (40 mg total) by mouth daily. NEED OV. Patient taking differently: Take 40 mg by mouth daily.  09/30/14  Yes Peter M Martinique, MD  memantine (NAMENDA) 10 MG tablet Take 10 mg by mouth daily.    Yes Historical Provider, MD  metoprolol succinate (TOPROL-XL) 25 MG 24 hr tablet TAKE 1 TABLET BY MOUTH DAILY 12/23/14  Yes Peter M Martinique, MD  potassium chloride SA (K-DUR,KLOR-CON) 20 MEQ tablet TAKE 1 TABLET BY MOUTH DAILY 12/02/14  Yes Peter M Martinique, MD  Rivaroxaban (XARELTO) 15 MG TABS tablet Take 15 mg by mouth daily.   Yes Historical Provider, MD   Physical Exam: Filed Vitals:   01/02/15 1130 01/02/15 1220 01/02/15 1315 01/02/15 1345  BP: 95/62  115/89 117/82  Pulse:    156  Temp:      TempSrc:      Resp: 21  15   Height:      Weight:  68.221 kg (150 lb 6.4 oz)    SpO2: 93%  97% 96%    Wt Readings from Last 3 Encounters:  01/02/15 68.221 kg (150 lb 6.4 oz)  05/09/14 70.353 kg (155 lb 1.6 oz)  04/12/14 74.753 kg (164 lb 12.8 oz)    General:  Appears calm and comfortable, lightly pale Eyes: PERRL, normal lids, irises & conjunctiva ENT: grossly normal hearing, lips & tongue Neck: no LAD, masses or thyromegaly Cardiovascular: Irregularly irregular hear no murmur no gallop tachycardic Telemetry: SR, no arrhythmias  Respiratory: While increased work of breathing with exertion. Fine  crackles bilateral bases. Hear no wheezing Abdomen: soft, ntnd Skin: no rash or induration seen on limited exam Musculoskeletal: grossly normal tone BUE/BLE Psychiatric: grossly normal mood and affect, speech fluent and appropriate Neurologic: grossly non-focal.          Labs on Admission:  Basic Metabolic Panel:  Recent Labs Lab 01/02/15 0914  NA 133*  K 4.2  CL 101  CO2 16*  GLUCOSE 156*  BUN 25*  CREATININE 1.84*  CALCIUM 8.9   Liver Function Tests:  Recent Labs Lab 01/02/15 0914  AST 35  ALT 18  ALKPHOS 65  BILITOT 1.9*  PROT 6.5  ALBUMIN 3.7    Recent Labs Lab 01/02/15 0914  LIPASE 43   No results for input(s): AMMONIA in the last 168 hours. CBC:  Recent Labs Lab 01/02/15 0914  WBC 17.3*  NEUTROABS 13.5*  HGB 12.2  HCT 38.6  MCV 79.1  PLT 309   Cardiac Enzymes:  Recent Labs Lab 01/02/15 0923  TROPONINI <0.03    BNP (last 3 results)  Recent Labs  04/10/14 1337 01/02/15 0923  BNP 413.8* 622.9*    ProBNP (last 3 results) No results for input(s): PROBNP in the last 8760 hours.  CBG: No results for input(s): GLUCAP in the last 168 hours.  Radiological Exams on Admission: Dg Chest Portable 1 View  01/02/2015  CLINICAL DATA:  Chest pain and tachycardia EXAM: PORTABLE CHEST 1 VIEW COMPARISON:  04/10/2014 FINDINGS: Cardiac enlargement with changes of CABG. Negative for heart failure. Lungs are clear without infiltrate or effusion. Negative for pneumonia. IMPRESSION: No active disease. Electronically Signed   By: Franchot Gallo M.D.   On: 01/02/2015 09:41   Ct Renal Stone Study  01/02/2015  CLINICAL DATA:  Abdominal pain, chronic kidney disease EXAM: CT ABDOMEN AND PELVIS WITHOUT CONTRAST TECHNIQUE: Multidetector CT imaging of the abdomen and pelvis was performed following the standard protocol without IV contrast. COMPARISON:  07/21/2004 FINDINGS: Sagittal images of the spine shows osteopenia and degenerative changes thoracolumbar spine.  Lung bases are unremarkable. Unenhanced liver shows no biliary ductal dilatation. No calcified gallstones are noted within gallbladder. Atherosclerotic calcifications of abdominal aorta, SMA, splenic artery and bilateral common iliac arteries. No aortic aneurysm. Unenhanced kidneys are symmetrical in size. Bilateral renal cortical thinning probable due to atrophy. There is a cyst in upper pole of the left kidney measures 2.4 cm. There is nonobstructive calculus in the left extrarenal pelvis measures 8.7 mm just above the left UPJ. No hydronephrosis or hydroureter. No calcified ureteral calculi. No small bowel obstruction. No ascites or free air. No adenopathy. Normal appendix is noted in axial image 59. No pericecal inflammation. Axial image 56 there is mild stranding of pericolonic fat in proximal sigmoid colon. Multiple sigmoid colon diverticula are noted. Mild diverticulitis cannot be excluded. There is no evidence of abscess or perforation. No extraluminal air. No free abdominal air. The uterus is atrophic. No adnexal mass. Moderate stool noted within rectum. The urinary bladder is under distended grossly unremarkable. Small hiatal hernia again noted. IMPRESSION: 1. There is mild stranding of pericolonic fat in proximal sigmoid colon axial image 56. Colonic diverticula are noted. Findings are suspicious for very mild diverticulitis. No abscess or perforation. 2. Normal appendix.  No pericecal inflammation. 3. There is a cyst in upper pole of the left kidney measures 2.4 cm. Nonobstructive calcified calculus in left extrarenal pelvis measures 8.7 mm. No hydronephrosis or hydroureter. 4. No calcified ureteral calculi. 5. No small bowel obstruction. Electronically Signed   By: Lahoma Crocker M.D.   On: 01/02/2015 12:25    EKG: Independently reviewed A. fib with RVR  Assessment/Plan Principal Problem:   Sepsis ([place]) Active Problems:   Chronic diastolic CHF (congestive heart failure), NYHA class 3 (HCC)    Hypertension   CKD (chronic kidney disease) stage 3, GFR 30-59 ml/min   UTI (lower urinary tract infection)   Atrial fibrillation with rapid ventricular response ([place])  #1. Sepsis related to urinary tract infection and perhaps mild UTI. She was given Flagyl in the emergency department. Will start Rocephin for her UTI. Blood pressure somewhat soft to is given fluid resuscitation and responded fairly well. Will admit her to step down. On exam she is nontoxic appearing. Blood cultures pending. Urine culture pending.  #2. A. fib with RVR. Chart review indicates she was diagnosed with A. fib first time in October of this year. She was evaluated by cardiology in the emergency department. Chads score  6. They recommended continuing diltiazem drip to titrate upwards if blood pressure allows. They also opined 3 the UTI tomorrow will consider DCCV without TEE. Note also indicates that her blood pressure allows they will add back Toprol 25 daily. If she still having recurrent A. fib will need to consider AVN ablation.  #3. Urinary tract infection. Leukocytosis 17. Will obtain urine culture. Will provide Rocephin. See above.  #4. Chronic diastolic heart failure. Echo in February of this year with an EF of 45%, mild LVH, mild MR. She is on beta blocker before hold for now. See above.  5. Acute on chronic renal insufficiency stage III. Try to very gently hydrate. Current creatinine somewhat above baseline. He was given 1 L of normal saline in the emergency department very gently. Will recheck in the morning  #6. Hypertension. See above  cardiology   Code Status: full DVT Prophylaxis: Family Communication: nephew at bedside Disposition Plan: home when ready  Time spent: 18 minutes  [facility]

## 2015-01-02 NOTE — ED Notes (Signed)
Pt eating clear liquid meal tray

## 2015-01-02 NOTE — Consult Note (Signed)
Patient ID: Cheryl Zamora MRN: [id number], DOB/AGE: 1923-08-29   Admit date: 01/02/2015   Primary Physician: Geoffery Lyons, MD Primary Cardiologist: Dr. Martinique  Pt. Profile:  81 year old Caucasian female with past medical history of HTN, HLD, CAD s/p CABG 2012, PAF on xarelto, chronic combined systolic and diastolic HF, and CKD stage 3 presented with afib with RVR  Problem List  Past Medical History  Diagnosis Date  . Hyperlipidemia   . Hypertension   . Coronary artery disease 2005    a. s/p CABG (2012)  . Neuropathy ([place])   . Atrial fibrillation with rapid ventricular response ([place]) 11/2012; 10/2013    a. s/p TEE/DCCV 11/2012 & 10/2013 b. amiodarone caused significant QT prlongation (>656mec)  . Arthritis     BACK OSTEO ARTHRITIS  . Chronic combined systolic and diastolic CHF (congestive heart failure) ([facility]     Past Surgical History  Procedure Laterality Date  . Cardiac catheterization  2005    LMain 60, LAD 90, D1 30, CFX 95->T, RCA OK  . Coronary artery bypass graft  2005    x 4; LIMA-LAD, SVG-D1, SVG-OM1-OM2  . Other surgical history      hysterectomy  . Other surgical history      tonsilectomy  . Back surgery      "neck sugery in 60's"  . Tee without cardioversion N/A 12/13/2012    Procedure: TRANSESOPHAGEAL ECHOCARDIOGRAM (TEE);  Surgeon: DLarey Dresser MD;  [place]  Service: Cardiovascular;  Laterality: N/A;  . Cardioversion N/A 12/13/2012    Procedure: CARDIOVERSION;  Surgeon: DLarey Dresser MD;  [place] [facility]  Service: Cardiovascular;  Laterality: N/A;  . Tee without cardioversion N/A 11/13/2013    Procedure: TRANSESOPHAGEAL ECHOCARDIOGRAM (TEE);  Surgeon: KDorothy Spark MD;  [place] [facility]  Service: Cardiovascular;  Laterality: N/A;  . Cardioversion N/A 11/13/2013    Procedure: CARDIOVERSION;  Surgeon: KDorothy Spark MD;  [place] [facility]  Service: Cardiovascular;  Laterality: N/A;  . Cardioversion  N/A 04/12/2014    Procedure: CARDIOVERSION;  Surgeon: PThayer Headings MD;  [facility]  Service: Cardiovascular;  Laterality: N/A;     Allergies  Allergies  Allergen Reactions  . Amiodarone     Excessive QT prolongation, nausea  . Codeine Nausea And Vomiting    HPI  The patient is a 81year old Caucasian female with past medical history of HTN, HLD, CAD s/p CABG 2012, PAF on xarelto, chronic combined systolic and diastolic HF, and CKD stage 3. She was first diagnosed with atrial fibrillation in October 2014 when she presented to his PCPs office with tachycardia. She had cardioversion at the time. In September 2015, she had recurrent atrial fibrillation. Amiodarone was started that was later discontinued due to significant QT prolongation (>6051mc).She underwent a repeat TEE DCCV. She was last admitted in February 2016 with A. fib with RVR and acute heart failure in the setting of rapid heart rate. She underwent TEE DC cardioversion on 04/2010. During that hospitalization, patient was seen by Dr. TaLovena Leho suggested that if she does not hold normal sinus rhythm, then would consider up titration of her AV nodal blocking agents, if still not controlled consider AV nodal ablation with BiV PPM. Patient has been doing well until 01/01/2015 when she went back into rapid A. Fib. Her nephew contacted Dr. JoDoug Souffice and was instructed to go to the ED. Apparently, her nephew could not convince the patient to go to the ED on 11/2, however she  eventually agreed to seek medical attention at [facility] ED on 11/3. She denies any shortness of breath or chest pain.  On arrival, significant laboratory finding include creatinine of 1.84 with normally her creatinine is around 1.1-1.3. White blood cell count was elevated at 17.3. BNP was elevated at 622. Troponin negative. Urinalysis was positive for UTI. Chest x-ray was negative for acute process. Patient was noted to be in atrial fibrillation with  RVR on EKG. She was placed on IV diltiazem, up titration of diabetes diltiazem has been limited by her blood pressure. Cardiology has been consulted for A. fib with RVR.  Of note, patient states she has been compliant with Xarelto, her last dose was yesterday morning.   Home Medications  Prior to Admission medications   Medication Sig Start Date End Date Taking? Authorizing Provider  furosemide (LASIX) 40 MG tablet Take 1 tablet (40 mg total) by mouth daily. NEED OV. Patient taking differently: Take 40 mg by mouth daily.  09/30/14  Yes Peter M Martinique, MD  memantine (NAMENDA) 10 MG tablet Take 10 mg by mouth daily.    Yes Historical Provider, MD  metoprolol succinate (TOPROL-XL) 25 MG 24 hr tablet TAKE 1 TABLET BY MOUTH DAILY 12/23/14  Yes Peter M Martinique, MD  potassium chloride SA (K-DUR,KLOR-CON) 20 MEQ tablet TAKE 1 TABLET BY MOUTH DAILY 12/02/14  Yes Peter M Martinique, MD  Rivaroxaban (XARELTO) 15 MG TABS tablet Take 15 mg by mouth daily.   Yes Historical Provider, MD    Family History  Family History  Problem Relation Age of Onset  . Heart attack Mother   . Heart attack Father   . Heart attack Brother   . Heart attack Brother   . Heart attack Brother   . Heart attack Brother     Social History  Social History   Social History  . Marital Status: Single    Spouse Name: N/A  . Number of Children: N/A  . Years of Education: N/A   Occupational History  . Retired    Social History Main Topics  . Smoking status: Former Smoker    Quit date: 03/02/2003  . Smokeless tobacco: Never Used  . Alcohol Use: No  . Drug Use: No  . Sexual Activity: Not Currently   Other Topics Concern  . Not on file   Social History Narrative   Pt son helps with her care.     Review of Systems General:  No chills, fever, night sweats or weight changes.  Cardiovascular:  No chest pain, dyspnea on exertion, edema, orthopnea, paroxysmal nocturnal dyspnea. +palpitations Dermatological: No rash,  lesions/masses Respiratory: No cough, dyspnea Urologic: No hematuria, dysuria Abdominal:   No nausea, vomiting, diarrhea, bright red blood per rectum, melena, or hematemesis Neurologic:  No visual changes, wkns, changes in mental status. All other systems reviewed and are otherwise negative except as noted above.  Physical Exam  Blood pressure 95/62, pulse 159, temperature 97.4 F (36.3 C), temperature source Oral, resp. rate 21, height '5\' 3"'$  (1.6 m), weight 150 lb (68.04 kg), SpO2 93 %.  General: Pleasant, NAD Psych: Normal affect. Neuro: Alert and oriented X 3. Moves all extremities spontaneously. HEENT: Normal  Neck: Supple without bruits or JVD. Lungs:  Resp regular and unlabored, CTA. Heart: RRR no s3, s4, or murmurs. Abdomen: Soft, non-tender, non-distended, BS + x 4.  Extremities: No clubbing, cyanosis or edema. DP/PT/Radials 2+ and equal bilaterally.  Labs  Troponin [facility] of Care Test)  Recent Labs  01/02/15 0919  TROPIPOC 0.02    Recent Labs  01/02/15 0923  TROPONINI <0.03   Lab Results  Component Value Date   WBC 17.3* 01/02/2015   HGB 12.2 01/02/2015   HCT 38.6 01/02/2015   MCV 79.1 01/02/2015   PLT 309 01/02/2015     Recent Labs Lab 01/02/15 0914  NA 133*  K 4.2  CL 101  CO2 16*  BUN 25*  CREATININE 1.84*  CALCIUM 8.9  PROT 6.5  BILITOT 1.9*  ALKPHOS 65  ALT 18  AST 35  GLUCOSE 156*     Radiology/Studies  Dg Chest Portable 1 View  01/02/2015  CLINICAL DATA:  Chest pain and tachycardia EXAM: PORTABLE CHEST 1 VIEW COMPARISON:  04/10/2014 FINDINGS: Cardiac enlargement with changes of CABG. Negative for heart failure. Lungs are clear without infiltrate or effusion. Negative for pneumonia. IMPRESSION: No active disease. Electronically Signed   By: Franchot Gallo M.D.   On: 01/02/2015 09:41    ECG  afib with RVR  Echocardiogram 04/12/2014  LV EF: 45% -   50%  ------------------------------------------------------------------- Indications:   Atrial fibrillation - 427.31.  ------------------------------------------------------------------- History:  PMH:  Coronary artery disease. Congestive heart failure. Risk factors: Hypertension.  ------------------------------------------------------------------- Study Conclusions  - Left ventricle: The cavity size was normal. Wall thickness was increased in a pattern of mild LVH. There was mild focal basal hypertrophy of the septum. Systolic function was mildly reduced. The estimated ejection fraction was in the range of 45% to 50%. Diffuse hypokinesis. - Mitral valve: There was mild regurgitation. - Left atrium: The atrium was mildly dilated. - Right ventricle: The cavity size was severely dilated. - Right atrium: The atrium was severely dilated. - Atrial septum: No defect or patent foramen ovale was identified. - Tricuspid valve: There was moderate regurgitation. - Pulmonary arteries: PA peak pressure: 33 mm Hg (S).    ASSESSMENT AND PLAN  1. PAF on RVR on xarelto  - CHA2DS2-Vasc score 6 (HF, female, age, CAD, HTN)  - continue IV diltiazem, uptitrate if allow. Treat UTI, likely DCCV without TEE once UTI adequately treated  - if BP allow, will add back Toprol XL '25mg'$  daily with addition of low dose PO diltiazem once off IV  - if still having recurrent afib, will need to readdress possible AVN ablation with BiV PPM  2. UTI with elevated WBC  3. Chronic combined systolic and diastolic HF  - Echo 10/01/2334 EF 45-50%, mild LVH, mild MR, severely dilated RV, moderate TR, peak PA pressure 51mHg  - continue to monitor  4. Acute on chronic renal insufficiency   - mild IV hydration, monitor for HF symptom esp since she is in rapid HR  5. CAD s/p CABG 2012: no obvious angina 6. HTN 7. HLD   Signed, MAlmyra Deforest PA-C 01/02/2015, 12:11 PM

## 2015-01-03 ENCOUNTER — Encounter (HOSPITAL_COMMUNITY): Payer: Self-pay | Admitting: General Practice

## 2015-01-03 ENCOUNTER — Encounter (HOSPITAL_COMMUNITY)
Admission: EM | Disposition: A | Discharge status: Skilled Nursing Facility | Payer: Self-pay | Source: Home / Self Care | Attending: Internal Medicine

## 2015-01-03 DIAGNOSIS — N183 Chronic kidney disease, stage 3 (moderate): Secondary | ICD-10-CM

## 2015-01-03 DIAGNOSIS — I10 Essential (primary) hypertension: Secondary | ICD-10-CM

## 2015-01-03 DIAGNOSIS — A408 Other streptococcal sepsis: Secondary | ICD-10-CM

## 2015-01-03 DIAGNOSIS — I4891 Unspecified atrial fibrillation: Secondary | ICD-10-CM

## 2015-01-03 LAB — BASIC METABOLIC PANEL
Anion gap: 13 (ref 5–15)
BUN: 24 mg/dL — AB (ref 6–20)
CALCIUM: 8.1 mg/dL — AB (ref 8.9–10.3)
CO2: 16 mmol/L — ABNORMAL LOW (ref 22–32)
CREATININE: 1.49 mg/dL — AB (ref 0.44–1.00)
Chloride: 102 mmol/L (ref 101–111)
GFR calc Af Amer: 35 mL/min — ABNORMAL LOW (ref >60)
GFR, EST NON AFRICAN AMERICAN: 30 mL/min — AB (ref >60)
Glucose, Bld: 119 mg/dL — ABNORMAL HIGH (ref 65–99)
Potassium: 4.3 mmol/L (ref 3.5–5.1)
SODIUM: 131 mmol/L — AB (ref 135–145)

## 2015-01-03 SURGERY — INVASIVE LAB ABORTED CASE

## 2015-01-03 MED ORDER — SODIUM CHLORIDE 0.9 % IJ SOLN: 3.0000 mL | INTRAMUSCULAR | Status: DC | PRN

## 2015-01-03 MED ORDER — MIDAZOLAM HCL 2 MG/2ML IJ SOLN
INTRAMUSCULAR | Status: AC
  Filled 2015-01-03: qty 4

## 2015-01-03 MED ORDER — ONDANSETRON HCL 4 MG/2ML IJ SOLN
4.0000 mg | Freq: Four times a day (QID) | INTRAMUSCULAR | Status: DC | PRN
  Administered 2015-01-03 – 2015-01-08 (×3): 4 mg via INTRAVENOUS
  Filled 2015-01-03 (×2): qty 2

## 2015-01-03 MED ORDER — METOPROLOL TARTRATE 12.5 MG HALF TABLET
12.5000 mg | ORAL_TABLET | Freq: Once | ORAL | Status: AC
  Administered 2015-01-03: 12.5 mg via ORAL
  Filled 2015-01-03: qty 1

## 2015-01-03 MED ORDER — SODIUM CHLORIDE 0.9 % IV SOLN
250.0000 mL | INTRAVENOUS | Status: DC
  Administered 2015-01-05: 250 mL via INTRAVENOUS

## 2015-01-03 MED ORDER — METOPROLOL TARTRATE 25 MG PO TABS
25.0000 mg | ORAL_TABLET | Freq: Two times a day (BID) | ORAL | Status: DC
  Administered 2015-01-03 – 2015-01-08 (×7): 25 mg via ORAL
  Filled 2015-01-03 (×8): qty 1

## 2015-01-03 MED ORDER — FUROSEMIDE 40 MG PO TABS
40.0000 mg | ORAL_TABLET | Freq: Every day | ORAL | Status: DC
  Administered 2015-01-03 – 2015-01-07 (×4): 40 mg via ORAL
  Filled 2015-01-03 (×4): qty 1

## 2015-01-03 MED ORDER — SODIUM CHLORIDE 0.9 % IJ SOLN
3.0000 mL | Freq: Two times a day (BID) | INTRAMUSCULAR | Status: DC
  Administered 2015-01-03 – 2015-01-08 (×7): 3 mL via INTRAVENOUS

## 2015-01-03 MED ORDER — SODIUM CHLORIDE 0.9 % IV SOLN: INTRAVENOUS | Status: AC

## 2015-01-03 MED ORDER — ENSURE ENLIVE PO LIQD: 237.0000 mL | Freq: Two times a day (BID) | ORAL | Status: DC

## 2015-01-03 MED ORDER — FENTANYL CITRATE (PF) 100 MCG/2ML IJ SOLN
INTRAMUSCULAR | Status: AC
  Filled 2015-01-03: qty 4

## 2015-01-03 MED ORDER — WHITE PETROLATUM GEL
Status: AC
  Administered 2015-01-03: 1
  Filled 2015-01-03: qty 1

## 2015-01-03 SURGICAL SUPPLY — 1 items: ELECT DEFIB PAD ADLT CADENCE (PAD) ×3 IMPLANT

## 2015-01-03 NOTE — Progress Notes (Signed)
Nutrition Brief Note  Patient identified on the Malnutrition Screening Tool (MST) Report.  Patient has has a 5% weight loss since February 2016 -- not significant for time frame.  Wt Readings from Last 15 Encounters:  01/02/15 156 lb 15.5 oz (71.2 kg)  05/09/14 155 lb 1.6 oz (70.353 kg)  04/12/14 164 lb 12.8 oz (74.753 kg)  11/15/13 165 lb 5.5 oz (75 kg)  05/29/13 161 lb (73.029 kg)  01/09/13 153 lb 1.9 oz (69.455 kg)  12/19/12 154 lb 8.7 oz (70.1 kg)    Body mass index is 27.81 kg/(m^2). Patient meets criteria for Overweight based on current BMI.  Current diet order is Heart Healthy/Carbohydrate Modified, patient is consuming approximately 100% of meals at this time.  Labs and medications reviewed.   No nutrition interventions warranted at this time. If nutrition issues arise, please consult RD.   Arthur Holms, RD, LDN Pager #: [phone] After-Hours Pager #: [phone]

## 2015-01-03 NOTE — Progress Notes (Signed)
Patient Name: Cheryl Zamora Date of Encounter: 01/03/2015  Primary Cardiologist: Dr. Martinique   Principal Problem:   [facility]) Active Problems:   Chronic diastolic CHF (congestive heart failure), NYHA class 3 ([facility])   Hypertension   CKD (chronic kidney disease) stage 3, GFR 30-59 ml/min   UTI (lower urinary tract infection)   Atrial fibrillation with rapid ventricular response ([place])    SUBJECTIVE  Denies any CP or SOB.  CURRENT MEDS . sodium chloride   Intravenous STAT  . cefTRIAXone (ROCEPHIN)  IV  1 g Intravenous Q24H  . feeding supplement (ENSURE ENLIVE)  237 mL Oral BID BM  . metoprolol tartrate  12.5 mg Oral Once  . metoprolol tartrate  25 mg Oral BID  . Rivaroxaban  15 mg Oral Q supper    OBJECTIVE  Filed Vitals:   01/03/15 0400 01/03/15 0424 01/03/15 0600 01/03/15 0700  BP:  96/69 91/69 125/76  Pulse: 71 141 140 140  Temp: 97.4 F (36.3 C)   97.8 F (36.6 C)  TempSrc: Axillary   Oral  Resp: '19 23 17 18  '$ Height:      Weight:      SpO2: 92% 92% 90% 92%    Intake/Output Summary (Last 24 hours) at 01/03/15 0953 Last data filed at 01/03/15 0900  Gross per 24 hour  Intake 4627.96 ml  Output    250 ml  Net 4377.96 ml   Filed Weights   01/02/15 0904 01/02/15 1220 01/02/15 1801  Weight: 150 lb (68.04 kg) 150 lb 6.4 oz (68.221 kg) 156 lb 15.5 oz (71.2 kg)    PHYSICAL EXAM  General: Pleasant, NAD. Neuro: Alert and oriented X 3. Moves all extremities spontaneously. Psych: Normal affect. HEENT:  Normal  Neck: Supple without bruits or JVD. Lungs:  Resp regular and unlabored. L basilar rale.  Heart: RRR no s3, s4, or murmurs. Abdomen: Soft, non-tender, non-distended, BS + x 4.  Extremities: No clubbing, cyanosis or edema. DP/PT/Radials 2+ and equal bilaterally.  Accessory Clinical Findings  CBC  Recent Labs  01/02/15 0914  WBC 17.3*  NEUTROABS 13.5*  HGB 12.2  HCT 38.6  MCV 79.1  PLT 315   Basic Metabolic Panel  Recent Labs   01/02/15 0914 01/03/15 0504  NA 133* 131*  K 4.2 4.3  CL 101 102  CO2 16* 16*  GLUCOSE 156* 119*  BUN 25* 24*  CREATININE 1.84* 1.49*  CALCIUM 8.9 8.1*   Liver Function Tests  Recent Labs  01/02/15 0914  AST 35  ALT 18  ALKPHOS 65  BILITOT 1.9*  PROT 6.5  ALBUMIN 3.7    Recent Labs  01/02/15 0914  LIPASE 43   Cardiac Enzymes  Recent Labs  01/02/15 0923  TROPONINI <0.03    TELE afib with HR 100-140s    ECG  No new EKG  Echocardiogram 04/12/2014  LV EF: 45% -  50%  ------------------------------------------------------------------- Indications:   Atrial fibrillation - 427.31.  ------------------------------------------------------------------- History:  PMH:  Coronary artery disease. Congestive heart failure. Risk factors: Hypertension.  ------------------------------------------------------------------- Study Conclusions  - Left ventricle: The cavity size was normal. Wall thickness was increased in a pattern of mild LVH. There was mild focal basal hypertrophy of the septum. Systolic function was mildly reduced. The estimated ejection fraction was in the range of 45% to 50%. Diffuse hypokinesis. - Mitral valve: There was mild regurgitation. - Left atrium: The atrium was mildly dilated. - Right ventricle: The cavity size was severely dilated. - Right  atrium: The atrium was severely dilated. - Atrial septum: No defect or patent foramen ovale was identified. - Tricuspid valve: There was moderate regurgitation. - Pulmonary arteries: PA peak pressure: 33 mm Hg (S).     Radiology/Studies  Dg Chest Portable 1 View  01/02/2015  CLINICAL DATA:  Chest pain and tachycardia EXAM: PORTABLE CHEST 1 VIEW COMPARISON:  04/10/2014 FINDINGS: Cardiac enlargement with changes of CABG. Negative for heart failure. Lungs are clear without infiltrate or effusion. Negative for pneumonia. IMPRESSION: No active disease. Electronically Signed   By:  Franchot Gallo M.D.   On: 01/02/2015 09:41   Ct Renal Stone Study  01/02/2015  CLINICAL DATA:  Abdominal pain, chronic kidney disease EXAM: CT ABDOMEN AND PELVIS WITHOUT CONTRAST TECHNIQUE: Multidetector CT imaging of the abdomen and pelvis was performed following the standard protocol without IV contrast. COMPARISON:  07/21/2004 FINDINGS: Sagittal images of the spine shows osteopenia and degenerative changes thoracolumbar spine. Lung bases are unremarkable. Unenhanced liver shows no biliary ductal dilatation. No calcified gallstones are noted within gallbladder. Atherosclerotic calcifications of abdominal aorta, SMA, splenic artery and bilateral common iliac arteries. No aortic aneurysm. Unenhanced kidneys are symmetrical in size. Bilateral renal cortical thinning probable due to atrophy. There is a cyst in upper pole of the left kidney measures 2.4 cm. There is nonobstructive calculus in the left extrarenal pelvis measures 8.7 mm just above the left UPJ. No hydronephrosis or hydroureter. No calcified ureteral calculi. No small bowel obstruction. No ascites or free air. No adenopathy. Normal appendix is noted in axial image 59. No pericecal inflammation. Axial image 56 there is mild stranding of pericolonic fat in proximal sigmoid colon. Multiple sigmoid colon diverticula are noted. Mild diverticulitis cannot be excluded. There is no evidence of abscess or perforation. No extraluminal air. No free abdominal air. The uterus is atrophic. No adnexal mass. Moderate stool noted within rectum. The urinary bladder is under distended grossly unremarkable. Small hiatal hernia again noted. IMPRESSION: 1. There is mild stranding of pericolonic fat in proximal sigmoid colon axial image 56. Colonic diverticula are noted. Findings are suspicious for very mild diverticulitis. No abscess or perforation. 2. Normal appendix.  No pericecal inflammation. 3. There is a cyst in upper pole of the left kidney measures 2.4 cm.  Nonobstructive calcified calculus in left extrarenal pelvis measures 8.7 mm. No hydronephrosis or hydroureter. 4. No calcified ureteral calculi. 5. No small bowel obstruction. Electronically Signed   By: Lahoma Crocker M.D.   On: 01/02/2015 12:25    ASSESSMENT AND PLAN  1. PAF on RVR on xarelto - CHA2DS2-Vasc score 6 (HF, female, age, CAD, HTN) - continue IV diltiazem, uptitrate if allow. Treat UTI, likely DCCV without TEE once UTI adequately treated - increase metoprolol to '25mg'$  BID today. HR still uncontrolled ranging 100-140s. No Amiodarone as she had significant QT prolongation (>66mec) in the past with amio - if still having recurrent afib, will need to readdress possible AVN ablation with BiV PPM  - may consider add 0.'25mg'$  IV digoxin if HR still uncontrolled  - tried to add her on for DCCV without TEE yesterday for today, scheduled was full. Tried again this morning, schedule still full. Will tentatively add her on for Monday if she does not convert over the weekend.  2. UTI with elevated WBC: lactic acid trending down  3. Chronic combined systolic and diastolic HF - Echo 26/16/0737EF 45-50%, mild LVH, mild MR, severely dilated RV, moderate TR, peak PA pressure 37mg - mildly fluid overloaded this  morning with rale in L base, receive 4L IVF yesterday, will restart '40mg'$  PO lasix home dose   4. Acute on chronic renal insufficiency  - monitor for HF symptom esp since she is in rapid HR  5. CAD s/p CABG 2012: no obvious angina 6. HTN 7. HLD  Signed, Almyra Deforest PA-C Pager: [phone]

## 2015-01-03 NOTE — Progress Notes (Signed)
[facility] PROGRESS NOTE  Cheryl Zamora MEQ:[id number] DOB: 1923-07-06 DOA: 01/02/2015  PCP: Geoffery Lyons, MD  Brief HPI: 81 year old Caucasian female with a past medical history of hypertension, coronary artery disease, recent diagnosis of paroxysmal atrial fibrillation on anticoagulation, chronic combined systolic and diastolic congestive heart failure, chronic kidney disease stage III, presented with palpitations. She was found to have an abnormal UA. CT suggested mild diverticulitis. She was found to have tachycardia and was noted to be in atrial fibrillation with RVR. She was hospitalized for further management.  Past medical history:  Past Medical History  Diagnosis Date  . Hyperlipidemia     pt denies this hx on 01/03/2015  . Hypertension   . Coronary artery disease 2005    a. s/p CABG (2012)  . Neuropathy ([place])   . Atrial fibrillation with rapid ventricular response ([place]) 11/2012; 10/2013    a. s/p TEE/DCCV 11/2012 & 10/2013 b. amiodarone caused significant QT prlongation (>638mec)  . Chronic combined systolic and diastolic CHF (congestive heart failure) [place]   . Osteoarthritis of back   . GERD (gastroesophageal reflux disease)     Consultants: Cardiology  Procedures: None yet  Antibiotics: Ceftriaxone 11/3  Subjective: Patient denies any chest pain, shortness of breath this morning. No nausea, vomiting. Denies any abdominal pain. No discomfort with urination. No diarrhea. Her nephew is at the bedside, who is also her power of attorney.  Objective: Vital Signs  Filed Vitals:   01/03/15 0204 01/03/15 0400 01/03/15 0424 01/03/15 0600  BP: 96/56  96/69 91/69  Pulse: 45 71 141 140  Temp:  97.4 F (36.3 C)    TempSrc:  Axillary    Resp: '19 19 23 17  '$ Height:      Weight:      SpO2: 94% 92% 92% 90%    Intake/Output Summary (Last 24 hours) at 01/03/15 0747 Last data filed at 01/03/15 0651  Gross per 24 hour  Intake 4387.96 ml  Output    250 ml  Net  4137.96 ml   Filed Weights   01/02/15 0904 01/02/15 1220 01/02/15 1801  Weight: 68.04 kg (150 lb) 68.221 kg (150 lb 6.4 oz) 71.2 kg (156 lb 15.5 oz)    General appearance: alert, cooperative, appears stated age and no distress Resp: Few crackles noted bilateral bases. No wheezing. No rhonchi. Cardio: S1, S2 is irregularly irregular. Tachycardic. No S3, S4. No rubs, murmurs, or bruit. No pedal edema. GI: soft, non-tender; bowel sounds normal; no masses,  no organomegaly Extremities: extremities normal, atraumatic, no cyanosis or edema Neurologic: Alert and oriented 3. No focal neurological deficits are noted.  Lab Results:  Basic Metabolic Panel:  Recent Labs Lab 01/02/15 0914 01/03/15 0504  NA 133* 131*  K 4.2 4.3  CL 101 102  CO2 16* 16*  GLUCOSE 156* 119*  BUN 25* 24*  CREATININE 1.84* 1.49*  CALCIUM 8.9 8.1*   Liver Function Tests:  Recent Labs Lab 01/02/15 0914  AST 35  ALT 18  ALKPHOS 65  BILITOT 1.9*  PROT 6.5  ALBUMIN 3.7    Recent Labs Lab 01/02/15 0914  LIPASE 43   CBC:  Recent Labs Lab 01/02/15 0914  WBC 17.3*  NEUTROABS 13.5*  HGB 12.2  HCT 38.6  MCV 79.1  PLT 309   Cardiac Enzymes:  Recent Labs Lab 01/02/15 0923  TROPONINI <0.03   BNP (last 3 results)  Recent Labs  04/10/14 1337 01/02/15 0923  BNP 413.8* 622.9*     Recent  Results (from the past 240 hour(s))  Blood culture (routine x 2)     Status: None (Preliminary result)   Collection Time: 01/02/15 12:20 PM  Result Value Ref Range Status   Specimen Description BLOOD LEFT FOREARM  Final   Special Requests BOTTLES DRAWN AEROBIC ONLY 2ML  Final   Culture PENDING  Incomplete   Report Status PENDING  Incomplete  Blood culture (routine x 2)     Status: None (Preliminary result)   Collection Time: 01/02/15 12:38 PM  Result Value Ref Range Status   Specimen Description BLOOD LEFT ANTECUBITAL  Final   Special Requests BOTTLES DRAWN AEROBIC AND ANAEROBIC 10ML  Final    Culture PENDING  Incomplete   Report Status PENDING  Incomplete  MRSA PCR Screening     Status: None   Collection Time: 01/02/15  6:51 PM  Result Value Ref Range Status   MRSA by PCR NEGATIVE NEGATIVE Final    Comment:        The GeneXpert MRSA Assay (FDA approved for NASAL specimens only), is one component of a comprehensive MRSA colonization surveillance program. It is not intended to diagnose MRSA infection nor to guide or monitor treatment for MRSA infections.       Studies/Results: Dg Chest Portable 1 View  01/02/2015  CLINICAL DATA:  Chest pain and tachycardia EXAM: PORTABLE CHEST 1 VIEW COMPARISON:  04/10/2014 FINDINGS: Cardiac enlargement with changes of CABG. Negative for heart failure. Lungs are clear without infiltrate or effusion. Negative for pneumonia. IMPRESSION: No active disease. Electronically Signed   By: Franchot Gallo M.D.   On: 01/02/2015 09:41   Ct Renal Stone Study  01/02/2015  CLINICAL DATA:  Abdominal pain, chronic kidney disease EXAM: CT ABDOMEN AND PELVIS WITHOUT CONTRAST TECHNIQUE: Multidetector CT imaging of the abdomen and pelvis was performed following the standard protocol without IV contrast. COMPARISON:  07/21/2004 FINDINGS: Sagittal images of the spine shows osteopenia and degenerative changes thoracolumbar spine. Lung bases are unremarkable. Unenhanced liver shows no biliary ductal dilatation. No calcified gallstones are noted within gallbladder. Atherosclerotic calcifications of abdominal aorta, SMA, splenic artery and bilateral common iliac arteries. No aortic aneurysm. Unenhanced kidneys are symmetrical in size. Bilateral renal cortical thinning probable due to atrophy. There is a cyst in upper pole of the left kidney measures 2.4 cm. There is nonobstructive calculus in the left extrarenal pelvis measures 8.7 mm just above the left UPJ. No hydronephrosis or hydroureter. No calcified ureteral calculi. No small bowel obstruction. No ascites or free air.  No adenopathy. Normal appendix is noted in axial image 59. No pericecal inflammation. Axial image 56 there is mild stranding of pericolonic fat in proximal sigmoid colon. Multiple sigmoid colon diverticula are noted. Mild diverticulitis cannot be excluded. There is no evidence of abscess or perforation. No extraluminal air. No free abdominal air. The uterus is atrophic. No adnexal mass. Moderate stool noted within rectum. The urinary bladder is under distended grossly unremarkable. Small hiatal hernia again noted. IMPRESSION: 1. There is mild stranding of pericolonic fat in proximal sigmoid colon axial image 56. Colonic diverticula are noted. Findings are suspicious for very mild diverticulitis. No abscess or perforation. 2. Normal appendix.  No pericecal inflammation. 3. There is a cyst in upper pole of the left kidney measures 2.4 cm. Nonobstructive calcified calculus in left extrarenal pelvis measures 8.7 mm. No hydronephrosis or hydroureter. 4. No calcified ureteral calculi. 5. No small bowel obstruction. Electronically Signed   By: Lahoma Crocker M.D.   On: 01/02/2015  12:25    Medications:  Scheduled: . sodium chloride   Intravenous STAT  . cefTRIAXone (ROCEPHIN)  IV  1 g Intravenous Q24H  . metoprolol tartrate  25 mg Oral BID  . Rivaroxaban  15 mg Oral Q supper  . sodium chloride  3 mL Intravenous Q12H   Continuous: . sodium chloride    . diltiazem (CARDIZEM) infusion 7.5 mg/hr (01/03/15 1000)   IXB:[id number], ondansetron, sodium chloride  Assessment/Plan:  Principal Problem:   Sepsis ([place]) Active Problems:   Chronic diastolic CHF (congestive heart failure), NYHA class 3 (HCC)   Hypertension   CKD (chronic kidney disease) stage 3, GFR 30-59 ml/min   UTI (lower urinary tract infection)   Atrial fibrillation with rapid ventricular response (HCC)    Atrial fibrillation with RVR Heart rate is poorly controlled despite being on a Cardizem infusion. Blood pressure is a limiting factor.  Cardiology is following. They plan to give her digoxin. Plan is for cardioversion if there is no improvement over the next day or so. Continue anticoagulation with Xarelto. Chadsvasc at least 5 ( CHF 1, HTN 1, Age 58, Female 1, CAD 1).  Sepsis secondary to UTI and perhaps mild diverticulitis [name] stable. She was started on ceftriaxone. Abdomen is completely benign. Denies any pain or other discomfort. I do not think she has diverticulitis based on my clinical assessment. Urine was abnormal yesterday. Cultures are pending. Repeat CBC tomorrow.  Chronic systolic and diastolic congestive heart failure.  EF earlier this year was about 45%. She could have some degree of fluid overload. We will appreciate cardiology input in this matter. She was on Lasix at home. She received IV fluids yesterday due to sepsis and hypotension. Continue to monitor. Strict ins and outs and daily weights. Baseline weight appears to be around 70-71 kg.  Acute on chronic renal failure stage III Creatinine is improved this morning. Hold off on further IV fluids. Monitor urine output   History of essential hypertension Blood pressures borderline low. Continue to monitor closely.  DVT Prophylaxis: On anticoagulation    Code Status: Full code  Family Communication: Discussed with the patient and her nephew  Disposition Plan: Continue current management. Await cardiology input.     LOS: 1 day   [facility] Pager [phone] 01/03/2015, 7:47 AM  If 7PM-7AM, please contact night-coverage at [email or web address], password [facility]

## 2015-01-03 NOTE — Discharge Instructions (Signed)

## 2015-01-04 LAB — BASIC METABOLIC PANEL
Anion gap: 11 (ref 5–15)
BUN: 29 mg/dL — AB (ref 6–20)
CHLORIDE: 96 mmol/L — AB (ref 101–111)
CO2: 20 mmol/L — AB (ref 22–32)
CREATININE: 1.9 mg/dL — AB (ref 0.44–1.00)
Calcium: 8.4 mg/dL — ABNORMAL LOW (ref 8.9–10.3)
GFR calc non Af Amer: 22 mL/min — ABNORMAL LOW (ref >60)
GFR, EST AFRICAN AMERICAN: 26 mL/min — AB (ref >60)
Glucose, Bld: 129 mg/dL — ABNORMAL HIGH (ref 65–99)
POTASSIUM: 4.1 mmol/L (ref 3.5–5.1)
Sodium: 127 mmol/L — ABNORMAL LOW (ref 135–145)

## 2015-01-04 LAB — CBC
HEMATOCRIT: 34.8 % — AB (ref 36.0–46.0)
HEMOGLOBIN: 10.8 g/dL — AB (ref 12.0–15.0)
MCH: 24.5 pg — ABNORMAL LOW (ref 26.0–34.0)
MCHC: 31 g/dL (ref 30.0–36.0)
MCV: 78.9 fL (ref 78.0–100.0)
Platelets: 279 10*3/uL (ref 150–400)
RBC: 4.41 MIL/uL (ref 3.87–5.11)
RDW: 16.2 % — ABNORMAL HIGH (ref 11.5–15.5)
WBC: 15.5 10*3/uL — AB (ref 4.0–10.5)

## 2015-01-04 NOTE — Progress Notes (Signed)
[facility] PROGRESS NOTE  Cheryl Zamora CBJ:[id number] DOB: [birth year] DOA: 01/02/2015  PCP: Geoffery Lyons, MD  Brief HPI: 81 year old Caucasian female with a past medical history of hypertension, coronary artery disease, recent diagnosis of paroxysmal atrial fibrillation on anticoagulation, chronic combined systolic and diastolic congestive heart failure, chronic kidney disease stage III, presented with palpitations. She was found to have an abnormal UA. CT suggested mild diverticulitis. She was found to have tachycardia and was noted to be in atrial fibrillation with RVR. She was hospitalized for further management.  Past medical history:  Past Medical History  Diagnosis Date  . Hyperlipidemia     pt denies this hx on 01/03/2015  . Hypertension   . Coronary artery disease 2005    a. s/p CABG (2012)  . Neuropathy ([place])   . Atrial fibrillation with rapid ventricular response ([place]) 11/2012; 10/2013    a. s/p TEE/DCCV 11/2012 & 10/2013 b. amiodarone caused significant QT prlongation (>636mec)  . Chronic combined systolic and diastolic CHF (congestive heart failure) ([place]   . Osteoarthritis of back   . GERD (gastroesophageal reflux disease)     Consultants: Cardiology  Procedures: None yet  Antibiotics: Ceftriaxone 11/3  Subjective: Patient feels well. Denies any chest pain, shortness of breath. No nausea, vomiting. No diarrhea. Denies any lightheadedness or dizziness.  Objective: Vital Signs  Filed Vitals:   01/03/15 2109 01/03/15 2328 01/04/15 0420 01/04/15 0608  BP: '97/73 97/66 99/67 '$   Pulse: 78 91 54   Temp:  97.7 F (36.5 C) 96.4 F (35.8 C) 97.5 F (36.4 C)  TempSrc:  Oral Rectal Oral  Resp:   15   Height:      Weight:   71.8 kg (158 lb 4.6 oz)   SpO2:  95% 94%     Intake/Output Summary (Last 24 hours) at 01/04/15 0755 Last data filed at 01/04/15 0650  Gross per 24 hour  Intake    725 ml  Output   1560 ml  Net   -835 ml   Filed Weights   01/02/15 1220 01/02/15 1801 01/04/15 0420  Weight: 68.221 kg (150 lb 6.4 oz) 71.2 kg (156 lb 15.5 oz) 71.8 kg (158 lb 4.6 oz)    General appearance: alert, cooperative, appears stated age and no distress Resp: Improved air entry noted today. Crackles at the bases, which diminished with deep breathing. No rhonchi.  Cardio: S1, S2 is irregularly irregular. Not as tachycardic as yesterday. No S3, S4. No rubs, murmurs, or bruit. No pedal edema. GI: soft, non-tender; bowel sounds normal; no masses,  no organomegaly Extremities: extremities normal, atraumatic, no cyanosis or edema Neurologic: Alert and oriented 3. No focal neurological deficits are noted.  Lab Results:  Basic Metabolic Panel:  Recent Labs Lab 01/02/15 0914 01/03/15 0504 01/04/15 0540  NA 133* 131* 127*  K 4.2 4.3 4.1  CL 101 102 96*  CO2 16* 16* 20*  GLUCOSE 156* 119* 129*  BUN 25* 24* 29*  CREATININE 1.84* 1.49* 1.90*  CALCIUM 8.9 8.1* 8.4*   Liver Function Tests:  Recent Labs Lab 01/02/15 0914  AST 35  ALT 18  ALKPHOS 65  BILITOT 1.9*  PROT 6.5  ALBUMIN 3.7    Recent Labs Lab 01/02/15 0914  LIPASE 43   CBC:  Recent Labs Lab 01/02/15 0914 01/04/15 0540  WBC 17.3* 15.5*  NEUTROABS 13.5*  --   HGB 12.2 10.8*  HCT 38.6 34.8*  MCV 79.1 78.9  PLT 309 279   Cardiac Enzymes:  Recent Labs Lab 01/02/15 0923  TROPONINI <0.03   BNP (last 3 results)  Recent Labs  04/10/14 1337 01/02/15 0923  BNP 413.8* 622.9*     Recent Results (from the past 240 hour(s))  Blood culture (routine x 2)     Status: None (Preliminary result)   Collection Time: 01/02/15 12:20 PM  Result Value Ref Range Status   Specimen Description BLOOD LEFT FOREARM  Final   Special Requests BOTTLES DRAWN AEROBIC ONLY 2ML  Final   Culture NO GROWTH 1 DAY  Final   Report Status PENDING  Incomplete  Blood culture (routine x 2)     Status: None (Preliminary result)   Collection Time: 01/02/15 12:38 PM  Result Value Ref  Range Status   Specimen Description BLOOD LEFT ANTECUBITAL  Final   Special Requests BOTTLES DRAWN AEROBIC AND ANAEROBIC 10ML  Final   Culture NO GROWTH 1 DAY  Final   Report Status PENDING  Incomplete  MRSA PCR Screening     Status: None   Collection Time: 01/02/15  6:51 PM  Result Value Ref Range Status   MRSA by PCR NEGATIVE NEGATIVE Final    Comment:        The GeneXpert MRSA Assay (FDA approved for NASAL specimens only), is one component of a comprehensive MRSA colonization surveillance program. It is not intended to diagnose MRSA infection nor to guide or monitor treatment for MRSA infections.       Studies/Results: Dg Chest Portable 1 View  01/02/2015  CLINICAL DATA:  Chest pain and tachycardia EXAM: PORTABLE CHEST 1 VIEW COMPARISON:  04/10/2014 FINDINGS: Cardiac enlargement with changes of CABG. Negative for heart failure. Lungs are clear without infiltrate or effusion. Negative for pneumonia. IMPRESSION: No active disease. Electronically Signed   By: Franchot Gallo M.D.   On: 01/02/2015 09:41   Ct Renal Stone Study  01/02/2015  CLINICAL DATA:  Abdominal pain, chronic kidney disease EXAM: CT ABDOMEN AND PELVIS WITHOUT CONTRAST TECHNIQUE: Multidetector CT imaging of the abdomen and pelvis was performed following the standard protocol without IV contrast. COMPARISON:  07/21/2004 FINDINGS: Sagittal images of the spine shows osteopenia and degenerative changes thoracolumbar spine. Lung bases are unremarkable. Unenhanced liver shows no biliary ductal dilatation. No calcified gallstones are noted within gallbladder. Atherosclerotic calcifications of abdominal aorta, SMA, splenic artery and bilateral common iliac arteries. No aortic aneurysm. Unenhanced kidneys are symmetrical in size. Bilateral renal cortical thinning probable due to atrophy. There is a cyst in upper pole of the left kidney measures 2.4 cm. There is nonobstructive calculus in the left extrarenal pelvis measures 8.7 mm  just above the left UPJ. No hydronephrosis or hydroureter. No calcified ureteral calculi. No small bowel obstruction. No ascites or free air. No adenopathy. Normal appendix is noted in axial image 59. No pericecal inflammation. Axial image 56 there is mild stranding of pericolonic fat in proximal sigmoid colon. Multiple sigmoid colon diverticula are noted. Mild diverticulitis cannot be excluded. There is no evidence of abscess or perforation. No extraluminal air. No free abdominal air. The uterus is atrophic. No adnexal mass. Moderate stool noted within rectum. The urinary bladder is under distended grossly unremarkable. Small hiatal hernia again noted. IMPRESSION: 1. There is mild stranding of pericolonic fat in proximal sigmoid colon axial image 56. Colonic diverticula are noted. Findings are suspicious for very mild diverticulitis. No abscess or perforation. 2. Normal appendix.  No pericecal inflammation. 3. There is a cyst in upper pole of the left kidney measures 2.4 cm.  Nonobstructive calcified calculus in left extrarenal pelvis measures 8.7 mm. No hydronephrosis or hydroureter. 4. No calcified ureteral calculi. 5. No small bowel obstruction. Electronically Signed   By: Lahoma Crocker M.D.   On: 01/02/2015 12:25    Medications:  Scheduled: . sodium chloride   Intravenous STAT  . cefTRIAXone (ROCEPHIN)  IV  1 g Intravenous Q24H  . furosemide  40 mg Oral Daily  . metoprolol tartrate  25 mg Oral BID  . Rivaroxaban  15 mg Oral Q supper  . sodium chloride  3 mL Intravenous Q12H   Continuous: . sodium chloride    . diltiazem (CARDIZEM) infusion 7.5 mg/hr (01/03/15 1900)   WKG:[id number], ondansetron, sodium chloride  Assessment/Plan:  Principal Problem:   Sepsis ([place]) Active Problems:   Chronic diastolic CHF (congestive heart failure), NYHA class 3 (HCC)   Hypertension   CKD (chronic kidney disease) stage 3, GFR 30-59 ml/min   UTI (lower urinary tract infection)   Atrial fibrillation with  rapid ventricular response (HCC)    Atrial fibrillation with RVR Heart rate seems to be improved this morning. She remains on diltiazem infusion as well as oral metoprolol. Cardiology is following closely. Defer management to them. Continue anticoagulation with Xarelto. Chadsvasc at least 5 ( CHF 1, HTN 1, Age 85, Female 1, CAD 1).  Sepsis secondary to UTI and perhaps mild diverticulitis Patient remains stable. Continue ceftriaxone. Abdomen is completely benign. Denies any pain or other discomfort. I do not think she has diverticulitis based on my clinical assessment. Urine was abnormal. Blood Cultures are pending. Unfortunately, urine culture was not sent. CBC shows improvement in WBC. Plan to change to oral antibiotics tomorrow.   Chronic systolic and diastolic congestive heart failure.  EF earlier this year was about 45%. Weight appears to be stable. Continue oral Lasix. Continue to monitor. Strict ins and outs and daily weights. Baseline weight appears to be around 70-71 kg.  Hyponatremia Could be secondary to volume overload. Repeat tomorrow morning. Since she has received IV fluids and diuretics, urine studies will not be very helpful at this time.  Acute on chronic renal failure stage III Creatinine is high this morning. Could be close to her baseline. Hold off on further IV fluids. Continue Lasix. Repeat in the morning.   History of essential hypertension Blood pressures borderline low. Continue to monitor closely.  DVT Prophylaxis: On anticoagulation    Code Status: Full code  Family Communication: Discussed with the patient Disposition Plan: Continue current management. Cardiology is following as well.     LOS: 2 days   [facility] Pager [phone] 01/04/2015, 7:55 AM  If 7PM-7AM, please contact night-coverage at [email or web address], password [facility]

## 2015-01-04 NOTE — Progress Notes (Signed)
Subjective:  Currently no complaints of shortness of breath.  Was napping when I came in but has now woken up.  Unable to do cardioversion yesterday.  Objective:  Vital Signs in the last 24 hours: BP 96/63 mmHg  Pulse 86  Temp(Src) 97.4 F (36.3 C) (Oral)  Resp 18  Ht '5\' 3"'$  (1.6 m)  Wt 71.8 kg (158 lb 4.6 oz)  BMI 28.05 kg/m2  SpO2 93%  Physical Exam: Obese elderly female in no acute distress Lungs:  Clear  Cardiac:  Irregular rhythm, normal S1 and S2, no S3 Extremities:  No edema present  Intake/Output from previous day: 11/04 0701 - 11/05 0700 In: 725 [P.O.:600; I.V.:75; IV Piggyback:50] Out: 6945 [Urine:1560] Weight Filed Weights   01/02/15 1220 01/02/15 1801 01/04/15 0420  Weight: 68.221 kg (150 lb 6.4 oz) 71.2 kg (156 lb 15.5 oz) 71.8 kg (158 lb 4.6 oz)   Lab Results: Basic Metabolic Panel:  Recent Labs  01/03/15 0504 01/04/15 0540  NA 131* 127*  K 4.3 4.1  CL 102 96*  CO2 16* 20*  GLUCOSE 119* 129*  BUN 24* 29*  CREATININE 1.49* 1.90*    CBC:  Recent Labs  01/02/15 0914 01/04/15 0540  WBC 17.3* 15.5*  NEUTROABS 13.5*  --   HGB 12.2 10.8*  HCT 38.6 34.8*  MCV 79.1 78.9  PLT 309 279    BNP    Component Value Date/Time   BNP 622.9* 01/02/2015 0923    PROTIME: Lab Results  Component Value Date   INR 3.06* 01/02/2015   INR 4.0 01/17/2014   INR 2.6 12/26/2013    Telemetry: Atrial fibrillation with controlled response  Assessment/Plan:  1.  Paroxysmal atrial fibrillation currently rate controlled on IV diltiazem 2.  Long-term anticoagulation with XARELTO  3.  Combined systolic and diastolic heart failure 4.  Urinary tract infection  Recommendations:  We'll try to taper the diltiazem over the weekend.  Tentatively plan cardioversion on Monday.      Kerry Hough  MD [facility]  01/04/2015, 11:20 AM

## 2015-01-05 LAB — CBC
HEMATOCRIT: 33.9 % — AB (ref 36.0–46.0)
HEMOGLOBIN: 10.5 g/dL — AB (ref 12.0–15.0)
MCH: 24.3 pg — ABNORMAL LOW (ref 26.0–34.0)
MCHC: 31 g/dL (ref 30.0–36.0)
MCV: 78.5 fL (ref 78.0–100.0)
Platelets: 282 10*3/uL (ref 150–400)
RBC: 4.32 MIL/uL (ref 3.87–5.11)
RDW: 16.2 % — ABNORMAL HIGH (ref 11.5–15.5)
WBC: 11.3 10*3/uL — AB (ref 4.0–10.5)

## 2015-01-05 LAB — BASIC METABOLIC PANEL
ANION GAP: 9 (ref 5–15)
BUN: 28 mg/dL — ABNORMAL HIGH (ref 6–20)
CALCIUM: 8.6 mg/dL — AB (ref 8.9–10.3)
CHLORIDE: 101 mmol/L (ref 101–111)
CO2: 23 mmol/L (ref 22–32)
Creatinine, Ser: 1.7 mg/dL — ABNORMAL HIGH (ref 0.44–1.00)
GFR calc non Af Amer: 26 mL/min — ABNORMAL LOW (ref >60)
GFR, EST AFRICAN AMERICAN: 30 mL/min — AB (ref >60)
Glucose, Bld: 108 mg/dL — ABNORMAL HIGH (ref 65–99)
Potassium: 3.7 mmol/L (ref 3.5–5.1)
Sodium: 133 mmol/L — ABNORMAL LOW (ref 135–145)

## 2015-01-05 MED ORDER — DOCUSATE SODIUM 100 MG PO CAPS
100.0000 mg | ORAL_CAPSULE | Freq: Two times a day (BID) | ORAL | Status: DC
  Administered 2015-01-05 – 2015-01-08 (×7): 100 mg via ORAL
  Filled 2015-01-05 (×7): qty 1

## 2015-01-05 MED ORDER — POTASSIUM CHLORIDE CRYS ER 20 MEQ PO TBCR
20.0000 meq | EXTENDED_RELEASE_TABLET | Freq: Once | ORAL | Status: AC
  Administered 2015-01-05: 20 meq via ORAL
  Filled 2015-01-05: qty 1

## 2015-01-05 NOTE — Progress Notes (Signed)
[facility] PROGRESS NOTE  Cheryl Zamora IOX:[id number] DOB: [birth year] DOA: 01/02/2015  PCP: Geoffery Lyons, MD  Brief HPI: 81 year old Caucasian female with a past medical history of hypertension, coronary artery disease, recent diagnosis of paroxysmal atrial fibrillation on anticoagulation, chronic combined systolic and diastolic congestive heart failure, chronic kidney disease stage III, presented with palpitations. She was found to have an abnormal UA. CT suggested mild diverticulitis. She was found to have tachycardia and was noted to be in atrial fibrillation with RVR. She was hospitalized for further management.  Past medical history:  Past Medical History  Diagnosis Date  . Hyperlipidemia     pt denies this hx on 01/03/2015  . Hypertension   . Coronary artery disease 2005    a. s/p CABG (2012)  . Neuropathy ([place])   . Atrial fibrillation with rapid ventricular response ([place]) 11/2012; 10/2013    a. s/p TEE/DCCV 11/2012 & 10/2013 b. amiodarone caused significant QT prlongation (>666mec)  . Chronic combined systolic and diastolic CHF (congestive heart failure) ([place]   . Osteoarthritis of back   . GERD (gastroesophageal reflux disease)     Consultants: Cardiology  Procedures: Plan is for cardioversion on 11/7  Antibiotics: Ceftriaxone 11/3  Subjective: Patient denies any complaints. No chest pain or shortness of breath. No nausea or vomiting.   Objective: Vital Signs  Filed Vitals:   01/04/15 2349 01/05/15 0000 01/05/15 0100 01/05/15 0355  BP: '83/58 96/59 85/60 '$ 91/67  Pulse: 101 44 91 99  Temp: 98 F (36.7 C)   97.5 F (36.4 C)  TempSrc: Axillary   Axillary  Resp: '22 15 18 17  '$ Height:      Weight:    72.2 kg (159 lb 2.8 oz)  SpO2: 92% 95% 97% 98%    Intake/Output Summary (Last 24 hours) at 01/05/15 0729 Last data filed at 01/05/15 0600  Gross per 24 hour  Intake    720 ml  Output   2175 ml  Net  -1455 ml   Filed Weights   01/02/15 1801 01/04/15  0420 01/05/15 0355  Weight: 71.2 kg (156 lb 15.5 oz) 71.8 kg (158 lb 4.6 oz) 72.2 kg (159 lb 2.8 oz)    General appearance: alert, cooperative, appears stated age and no distress Resp: Improved air entry. Very few Crackles at the bases, which diminish with deep breathing. No rhonchi.  Cardio: S1, S2 is irregularly irregular. No S3, S4. No rubs, murmurs, or bruit. No pedal edema. GI: soft, non-tender; bowel sounds normal; no masses,  no organomegaly Extremities: extremities normal, atraumatic, no cyanosis or edema Neurologic: Alert and oriented 3. No focal neurological deficits are noted.  Lab Results:  Basic Metabolic Panel:  Recent Labs Lab 01/02/15 0914 01/03/15 0504 01/04/15 0540 01/05/15 0237  NA 133* 131* 127* 133*  K 4.2 4.3 4.1 3.7  CL 101 102 96* 101  CO2 16* 16* 20* 23  GLUCOSE 156* 119* 129* 108*  BUN 25* 24* 29* 28*  CREATININE 1.84* 1.49* 1.90* 1.70*  CALCIUM 8.9 8.1* 8.4* 8.6*   Liver Function Tests:  Recent Labs Lab 01/02/15 0914  AST 35  ALT 18  ALKPHOS 65  BILITOT 1.9*  PROT 6.5  ALBUMIN 3.7    Recent Labs Lab 01/02/15 0914  LIPASE 43   CBC:  Recent Labs Lab 01/02/15 0914 01/04/15 0540 01/05/15 0237  WBC 17.3* 15.5* 11.3*  NEUTROABS 13.5*  --   --   HGB 12.2 10.8* 10.5*  HCT 38.6 34.8* 33.9*  MCV  79.1 78.9 78.5  PLT 309 279 282   Cardiac Enzymes:  Recent Labs Lab 01/02/15 0923  TROPONINI <0.03   BNP (last 3 results)  Recent Labs  04/10/14 1337 01/02/15 0923  BNP 413.8* 622.9*     Recent Results (from the past 240 hour(s))  Blood culture (routine x 2)     Status: None (Preliminary result)   Collection Time: 01/02/15 12:20 PM  Result Value Ref Range Status   Specimen Description BLOOD LEFT FOREARM  Final   Special Requests BOTTLES DRAWN AEROBIC ONLY 2ML  Final   Culture NO GROWTH 2 DAYS  Final   Report Status PENDING  Incomplete  Blood culture (routine x 2)     Status: None (Preliminary result)   Collection Time:  01/02/15 12:38 PM  Result Value Ref Range Status   Specimen Description BLOOD LEFT ANTECUBITAL  Final   Special Requests BOTTLES DRAWN AEROBIC AND ANAEROBIC 10ML  Final   Culture NO GROWTH 2 DAYS  Final   Report Status PENDING  Incomplete  MRSA PCR Screening     Status: None   Collection Time: 01/02/15  6:51 PM  Result Value Ref Range Status   MRSA by PCR NEGATIVE NEGATIVE Final    Comment:        The GeneXpert MRSA Assay (FDA approved for NASAL specimens only), is one component of a comprehensive MRSA colonization surveillance program. It is not intended to diagnose MRSA infection nor to guide or monitor treatment for MRSA infections.       Studies/Results: No results found.  Medications:  Scheduled: . cefTRIAXone (ROCEPHIN)  IV  1 g Intravenous Q24H  . furosemide  40 mg Oral Daily  . metoprolol tartrate  25 mg Oral BID  . Rivaroxaban  15 mg Oral Q supper  . sodium chloride  3 mL Intravenous Q12H   Continuous: . sodium chloride    . diltiazem (CARDIZEM) infusion 7.5 mg/hr (01/05/15 0518)   XHB:[id number], ondansetron, sodium chloride  Assessment/Plan:  Principal Problem:   Sepsis ([place]) Active Problems:   Chronic diastolic CHF (congestive heart failure), NYHA class 3 (HCC)   Hypertension   CKD (chronic kidney disease) stage 3, GFR 30-59 ml/min   UTI (lower urinary tract infection)   Atrial fibrillation with rapid ventricular response (HCC)    Atrial fibrillation with RVR Heart rate seems to be improved this morning, though she still remains slightly tachycardic. She remains on diltiazem infusion as well as oral metoprolol. Cardiology is following closely. Defer management to them. It appears that plan is for cardioversion on Monday. Continue anticoagulation with Xarelto. Chadsvasc at least 5 ( CHF 1, HTN 1, Age 73, Female 1, CAD 1).  Sepsis secondary to UTI and perhaps mild diverticulitis Patient remains stable. Abdomen remains benign. I do not think she  has diverticulitis based on my clinical assessment. Urine was abnormal. Blood Cultures are negative so far. Unfortunately, urine culture was not sent. CBC shows improvement in WBC. Since plan is for a procedure tomorrow we will leave her on IV antibiotics for now.  Chronic systolic and diastolic congestive heart failure.  EF earlier this year was about 45%. Continue oral Lasix. Some gain in weight is noted. Continue to monitor. Strict ins and outs and daily weights. Baseline weight appears to be around 70-71 kg.  Hyponatremia Could be secondary to volume overload. Improved today. Continue to monitor while on Lasix.   Acute on chronic renal failure stage III Creatinine is improved this morning. Hold off on  further IV fluids. Continue Lasix. Continue to monitor.   History of essential hypertension Blood pressure is borderline low. Continue to monitor closely.  DVT Prophylaxis: On anticoagulation    Code Status: Full code  Family Communication: Discussed with the patient Disposition Plan: Continue current management. Cardiology is following as well. Cardioversion tomorrow.     LOS: 3 days   [facility] Pager [phone] 01/05/2015, 7:29 AM  If 7PM-7AM, please contact night-coverage at [email or web address], password [facility]

## 2015-01-05 NOTE — Progress Notes (Signed)
Subjective:  Currently no complaints of shortness of breath.  Atrial fibrillation rate still mildly up.  Renal function appears to be somewhat improved.  Objective:  Vital Signs in the last 24 hours: BP 96/64 mmHg  Pulse 96  Temp(Src) 97.4 F (36.3 C) (Oral)  Resp 16  Ht '5\' 3"'$  (1.6 m)  Wt 72.2 kg (159 lb 2.8 oz)  BMI 28.20 kg/m2  SpO2 96%  Physical Exam: Obese elderly female in no acute distress Lungs:  Clear  Cardiac:  Irregular rhythm, normal S1 and S2, no S3 Extremities:  No edema present  Intake/Output from previous day: 11/05 0701 - 11/06 0700 In: 720 [P.O.:480; I.V.:190; IV Piggyback:50] Out: 2175 [Urine:2175] Weight Filed Weights   01/02/15 1801 01/04/15 0420 01/05/15 0355  Weight: 71.2 kg (156 lb 15.5 oz) 71.8 kg (158 lb 4.6 oz) 72.2 kg (159 lb 2.8 oz)   Lab Results: Basic Metabolic Panel:  Recent Labs  01/04/15 0540 01/05/15 0237  NA 127* 133*  K 4.1 3.7  CL 96* 101  CO2 20* 23  GLUCOSE 129* 108*  BUN 29* 28*  CREATININE 1.90* 1.70*    CBC:  Recent Labs  01/02/15 0914 01/04/15 0540 01/05/15 0237  WBC 17.3* 15.5* 11.3*  NEUTROABS 13.5*  --   --   HGB 12.2 10.8* 10.5*  HCT 38.6 34.8* 33.9*  MCV 79.1 78.9 78.5  PLT 309 279 282    BNP    Component Value Date/Time   BNP 622.9* 01/02/2015 0923    PROTIME: Lab Results  Component Value Date   INR 3.06* 01/02/2015   INR 4.0 01/17/2014   INR 2.6 12/26/2013    Telemetry: Atrial fibrillation with controlled response  Assessment/Plan:  1.  Paroxysmal atrial fibrillation rate still somewhat elevated so will continue diltiazem intravenously.    2.  Long-term anticoagulation with XARELTO  3.  Combined systolic and diastolic heart failure  4.  Urinary tract infection-improved  Recommendations:  Cardioversion discussed with patient and nephew and they're agreeable to proceed.  She has had it done before several times.  Nephew reminds that she is very sensitive to propofol. Keep NPO after  midnight.     Kerry Hough  MD [facility]  01/05/2015, 8:46 AM

## 2015-01-05 NOTE — Care Management Important Message (Signed)
Important Message  Patient Details  Name: Cheryl Zamora MRN: [id number] Date of Birth: 26-Oct-1923   Medicare Important Message Given:  Yes-second notification given    Nathen May 01/05/2015, 10:00 AM

## 2015-01-06 ENCOUNTER — Encounter (HOSPITAL_COMMUNITY)
Admission: EM | Disposition: A | Discharge status: Skilled Nursing Facility | Payer: Self-pay | Source: Home / Self Care | Attending: Internal Medicine

## 2015-01-06 ENCOUNTER — Inpatient Hospital Stay (HOSPITAL_COMMUNITY): Payer: Medicare Other | Admitting: Anesthesiology

## 2015-01-06 ENCOUNTER — Encounter (HOSPITAL_COMMUNITY): Payer: Self-pay

## 2015-01-06 DIAGNOSIS — N189 Chronic kidney disease, unspecified: Secondary | ICD-10-CM

## 2015-01-06 DIAGNOSIS — I4891 Unspecified atrial fibrillation: Secondary | ICD-10-CM | POA: Insufficient documentation

## 2015-01-06 DIAGNOSIS — N289 Disorder of kidney and ureter, unspecified: Secondary | ICD-10-CM

## 2015-01-06 HISTORY — PX: CARDIOVERSION: SHX1299

## 2015-01-06 LAB — CBC WITH DIFFERENTIAL/PLATELET
Basophils Absolute: 0.1 10*3/uL (ref 0.0–0.1)
Basophils Relative: 0 %
EOS ABS: 0.1 10*3/uL (ref 0.0–0.7)
EOS PCT: 1 %
HCT: 34.4 % — ABNORMAL LOW (ref 36.0–46.0)
Hemoglobin: 10.5 g/dL — ABNORMAL LOW (ref 12.0–15.0)
LYMPHS ABS: 2.1 10*3/uL (ref 0.7–4.0)
LYMPHS PCT: 18 %
MCH: 24.4 pg — AB (ref 26.0–34.0)
MCHC: 30.5 g/dL (ref 30.0–36.0)
MCV: 80 fL (ref 78.0–100.0)
MONO ABS: 1.1 10*3/uL — AB (ref 0.1–1.0)
Monocytes Relative: 9 %
Neutro Abs: 8.2 10*3/uL — ABNORMAL HIGH (ref 1.7–7.7)
Neutrophils Relative %: 72 %
PLATELETS: 268 10*3/uL (ref 150–400)
RBC: 4.3 MIL/uL (ref 3.87–5.11)
RDW: 16.5 % — AB (ref 11.5–15.5)
WBC: 11.5 10*3/uL — AB (ref 4.0–10.5)

## 2015-01-06 LAB — URINALYSIS, ROUTINE W REFLEX MICROSCOPIC
BILIRUBIN URINE: NEGATIVE
Glucose, UA: NEGATIVE mg/dL
KETONES UR: 15 mg/dL — AB
NITRITE: NEGATIVE
PH: 5.5 (ref 5.0–8.0)
Protein, ur: 100 mg/dL — AB
SPECIFIC GRAVITY, URINE: 1.016 (ref 1.005–1.030)
UROBILINOGEN UA: 1 mg/dL (ref 0.0–1.0)

## 2015-01-06 LAB — BASIC METABOLIC PANEL
Anion gap: 11 (ref 5–15)
BUN: 24 mg/dL — ABNORMAL HIGH (ref 6–20)
CHLORIDE: 99 mmol/L — AB (ref 101–111)
CO2: 24 mmol/L (ref 22–32)
Calcium: 8.6 mg/dL — ABNORMAL LOW (ref 8.9–10.3)
Creatinine, Ser: 1.33 mg/dL — ABNORMAL HIGH (ref 0.44–1.00)
GFR, EST AFRICAN AMERICAN: 40 mL/min — AB (ref >60)
GFR, EST NON AFRICAN AMERICAN: 34 mL/min — AB (ref >60)
Glucose, Bld: 148 mg/dL — ABNORMAL HIGH (ref 65–99)
POTASSIUM: 3.3 mmol/L — AB (ref 3.5–5.1)
SODIUM: 134 mmol/L — AB (ref 135–145)

## 2015-01-06 LAB — CBC
HEMATOCRIT: 32.8 % — AB (ref 36.0–46.0)
Hemoglobin: 10.1 g/dL — ABNORMAL LOW (ref 12.0–15.0)
MCH: 24.3 pg — ABNORMAL LOW (ref 26.0–34.0)
MCHC: 30.8 g/dL (ref 30.0–36.0)
MCV: 79 fL (ref 78.0–100.0)
PLATELETS: 266 10*3/uL (ref 150–400)
RBC: 4.15 MIL/uL (ref 3.87–5.11)
RDW: 16.4 % — AB (ref 11.5–15.5)
WBC: 11.2 10*3/uL — AB (ref 4.0–10.5)

## 2015-01-06 LAB — URINE MICROSCOPIC-ADD ON

## 2015-01-06 SURGERY — CARDIOVERSION
Anesthesia: General

## 2015-01-06 MED ORDER — SODIUM CHLORIDE 0.9 % IV SOLN
INTRAVENOUS | Status: DC | PRN
  Administered 2015-01-06: 13:00:00 via INTRAVENOUS

## 2015-01-06 MED ORDER — PROPOFOL 10 MG/ML IV BOLUS
INTRAVENOUS | Status: DC | PRN
  Administered 2015-01-06: 50 mg via INTRAVENOUS

## 2015-01-06 MED ORDER — HYDROCORTISONE 1 % EX CREA
1.0000 "application " | TOPICAL_CREAM | Freq: Three times a day (TID) | CUTANEOUS | Status: DC | PRN
  Filled 2015-01-06: qty 28

## 2015-01-06 MED ORDER — LIDOCAINE HCL (CARDIAC) 20 MG/ML IV SOLN
INTRAVENOUS | Status: DC | PRN
  Administered 2015-01-06: 50 mg via INTRAVENOUS

## 2015-01-06 MED ORDER — ATROPINE SULFATE 1 MG/ML IJ SOLN
INTRAMUSCULAR | Status: DC | PRN
  Administered 2015-01-06: .1 mg via INTRAVENOUS

## 2015-01-06 MED ORDER — PHENYLEPHRINE HCL 10 MG/ML IJ SOLN
INTRAMUSCULAR | Status: DC | PRN
  Administered 2015-01-06: 40 ug via INTRAVENOUS

## 2015-01-06 MED ORDER — POTASSIUM CHLORIDE CRYS ER 20 MEQ PO TBCR
40.0000 meq | EXTENDED_RELEASE_TABLET | Freq: Once | ORAL | Status: AC
  Administered 2015-01-06: 40 meq via ORAL
  Filled 2015-01-06: qty 2

## 2015-01-06 MED ORDER — SODIUM CHLORIDE 0.45 % IV SOLN: INTRAVENOUS | Status: DC

## 2015-01-06 MED ORDER — EPHEDRINE 5 MG/ML INJ
INTRAVENOUS | Status: DC | PRN
Start: 2015-01-06 — End: 2015-01-06
  Administered 2015-01-06 (×2): 5 mg via INTRAVENOUS
  Administered 2015-01-06: 10 mg via INTRAVENOUS

## 2015-01-06 NOTE — H&P (View-Only) (Signed)
DAILY PROGRESS NOTE  PCP: Geoffery Lyons, MD  Brief HPI: 81 year old Caucasian female with a past medical history of hypertension, coronary artery disease, recent diagnosis of paroxysmal atrial fibrillation on anticoagulation, chronic combined systolic and diastolic congestive heart failure, chronic kidney disease stage III, presented with palpitations. She was found to have an abnormal UA. CT suggested mild diverticulitis. She was found to have tachycardia and was noted to be in atrial fibrillation with RVR. She was hospitalized for further management.  Subjective:  Feels fine - no CP, SOB, dysuria.  No sensation of irregular/rapid HR.  Objective:  Vital Signs in the last 24 hours: Temp:  [97.4 F (36.3 C)-98 F (36.7 C)] 97.4 F (36.3 C) (11/07 0746) Pulse Rate:  [67-135] 102 (11/07 1050) Resp:  [16-25] 21 (11/07 0746) BP: (82-108)/(52-70) 83/54 mmHg (11/07 1050) SpO2:  [95 %-98 %] 98 % (11/07 0416) Weight:  [158 lb 8.2 oz (71.9 kg)] 158 lb 8.2 oz (71.9 kg) (11/07 0500)  Intake/Output from previous day: 11/06 0701 - 11/07 0700 In: 1088 [P.O.:960; I.V.:78; IV Piggyback:50] Out: 2350 [Urine:2350] Intake/Output from this shift: Total I/O In: -  Out: 250 [Urine:250]   TELE: Afib - rates from 80s-120s, currently 100-110.  General appearance: alert, cooperative, appears stated age and no distress Neck: no adenopathy, no carotid bruit and pulsatile Jugular Veins, but not elevated Lungs: clear to auscultation bilaterally - with exception of mild LLL crackles, normal percussion bilaterally and non-labored Heart: irregularly irregular rhythm, S1, S2 normal and NO M/R/G Abdomen: soft, non-tender; bowel sounds normal; no masses,  no organomegaly Extremities: extremities normal, atraumatic, no cyanosis or edema Pulses: 2+ and symmetric Neurologic: Grossly normal  Lab Results:  Recent Labs  01/05/15 0237 01/06/15 0306  WBC 11.3* 11.2*  HGB 10.5* 10.1*  PLT 282 266     Recent Labs  01/05/15 0237 01/06/15 0306  NA 133* 134*  K 3.7 3.3*  CL 101 99*  CO2 23 24  GLUCOSE 108* 148*  BUN 28* 24*  CREATININE 1.70* 1.33*   No results for input(s): TROPONINI in the last 72 hours.  Invalid input(s): CK, MB Hepatic Function Panel No results for input(s): PROT, ALBUMIN, AST, ALT, ALKPHOS, BILITOT, BILIDIR, IBILI in the last 72 hours. No results for input(s): CHOL in the last 72 hours. No results for input(s): PROTIME in the last 72 hours.  Imaging: No new study  Cardiac Studies: no new studies  Assessment/Plan:  Principal Problem:   Sepsis ([place]) Active Problems:   CAD, multiple vessel, hx CABG 2012   Atrial fibrillation with rapid ventricular response (HCC)   PAF (paroxysmal atrial fibrillation) (HCC)   Essential hypertension   CKD (chronic kidney disease) stage 3, GFR 30-59 ml/min   UTI (lower urinary tract infection)   Acute on chronic renal insufficiency (HCC)   Chronic diastolic CHF (congestive heart failure), NYHA class 3 (HCC)  Principal Problem:   Sepsis ([place]) -   UTI (lower urinary tract infection): remains borderline hypotensive.    Likely driving force for Afib.  On Abx for UTI per TR[facility].  Dark urine -- ? Hematuria.  concerning wityh Xarelto - monitor for true hematuria  Active Problems:   CAD, multiple vessel, hx CABG 2012 - no active angina despite RVR.    PAF (paroxysmal atrial fibrillation) (HCC) w/Atrial fibrillation with rapid ventricular response (HCC)  Rate is proving difficult to control with low BP a major limiting factor. -- with planned DCCV, can reduce CCB dose to allow for BP stability.  Weekend notes suggest potential DCCV today - in afternoon  Remains on Xarelto  This patients CHA2DS2-VASc Score and unadjusted Ischemic Stroke Rate (% per year) is equal to 9.7 % stroke rate/year from a score of 6  Above score calculated as 1 point each if present [CHF, HTN, DM, Vascular=MI/PAD/Aortic Plaque, Age if 65-74,  or Female] Above score calculated as 2 points each if present [Age > 75, or Stroke/TIA/TE]    Essential hypertension - antihypertensives on hold   CKD (chronic kidney disease) stage 3, GFR 30-59 ml/min -   Acute on chronic renal insufficiency (HCC)  Renal function improving - likely related to UTI     Chronic diastolic CHF (congestive heart failure), NYHA class 3 (HCC) - on low dose diuretic; likely made worse by Afib RVR.  Continue diuretic to avoid HF exacerbation  Does not appear to be volume overloaded; weights stable. Lungs are clear    LOS: 4 days    HARDING, DAVID W 01/06/2015, 11:07 AM

## 2015-01-06 NOTE — Interval H&P Note (Signed)
History and Physical Interval Note:  01/06/2015 1:30 PM  Cheryl Zamora  has presented today for surgery, with the diagnosis of AFIB  The various methods of treatment have been discussed with the patient and family. After consideration of risks, benefits and other options for treatment, the patient has consented to  Procedure(s): CARDIOVERSION (N/A) as a surgical intervention .  The patient's history has been reviewed, patient examined, no change in status, stable for surgery.  I have reviewed the patient's chart and labs.  Questions were answered to the patient's satisfaction.     Dainelle Hun [employer]

## 2015-01-06 NOTE — Progress Notes (Signed)
EKG CRITICAL VALUE     12 lead EKG performed.  Critical value noted.  Autum , RN notified.   Neva Seat, CCT 01/06/2015 2:04 PM

## 2015-01-06 NOTE — Anesthesia Postprocedure Evaluation (Signed)
  Anesthesia Post-op Note  Patient: Cheryl Zamora  Procedure(s) Performed: Procedure(s): CARDIOVERSION (N/A)  Patient Location: Endoscopy Unit  Anesthesia Type:MAC  Level of Consciousness: awake, alert  and oriented  Airway and Oxygen Therapy: Patient Spontanous Breathing and Patient connected to nasal cannula oxygen  Post-op Pain: none  Post-op Assessment: Post-op Vital signs reviewed, Patient's Cardiovascular Status Stable, Respiratory Function Stable and No signs of Nausea or vomiting              Post-op Vital Signs: Reviewed and stable  Last Vitals:  Filed Vitals:   01/06/15 1410  BP: 98/43  Pulse: 50  Temp:   Resp: 17    Complications: No apparent anesthesia complications

## 2015-01-06 NOTE — Progress Notes (Signed)
Pt returned from [place] in Junctional rate of 52 BP 92/58 Alert/oriented HOB up 25 degrees Nephrew here asking when she can eat orders released

## 2015-01-06 NOTE — Progress Notes (Signed)
Cardiology PA Almyra Deforest came by to check on Pt post cardioversion - made aware that PO Lasix was held this am due to SBP in the 80's pre-cardioversion. [facility].

## 2015-01-06 NOTE — Anesthesia Preprocedure Evaluation (Addendum)
Anesthesia Evaluation  Patient identified by MRN, date of birth, ID band Patient awake    Reviewed: Allergy & Precautions, H&P , NPO status , Patient's Chart, lab work & pertinent test results, reviewed documented beta blocker date and time   Airway Mallampati: I  TM Distance: >3 FB Neck ROM: Full    Dental no notable dental hx. (+) Teeth Intact, Dental Advisory Given   Pulmonary neg pulmonary ROS, former smoker,    Pulmonary exam normal breath sounds clear to auscultation       Cardiovascular hypertension, Pt. on medications and Pt. on home beta blockers + CAD and +CHF  + dysrhythmias Atrial Fibrillation  Rhythm:Irregular Rate:Tachycardia     Neuro/Psych negative neurological ROS  negative psych ROS   GI/Hepatic negative GI ROS, Neg liver ROS,   Endo/Other  negative endocrine ROS  Renal/GU Renal InsufficiencyRenal disease     Musculoskeletal  (+) Arthritis , Osteoarthritis,    Abdominal   Peds  Hematology negative hematology ROS (+)   Anesthesia Other Findings   Reproductive/Obstetrics negative OB ROS                             Anesthesia Physical  Anesthesia Plan  ASA: III  Anesthesia Plan: General   Post-op Pain Management:    Induction: Intravenous  Airway Management Planned: Mask  Additional Equipment: None  Intra-op Plan:   Post-operative Plan:   Informed Consent: I have reviewed the patients History and Physical, chart, labs and discussed the procedure including the risks, benefits and alternatives for the proposed anesthesia with the patient or authorized representative who has indicated his/her understanding and acceptance.   Dental advisory given  Plan Discussed with: CRNA, Anesthesiologist and Surgeon  Anesthesia Plan Comments:         Anesthesia Quick Evaluation

## 2015-01-06 NOTE — Progress Notes (Signed)
Text page to Dr [place] Pink to notify that pt is having cherry colored urine and on Xarelto po Pt still in Junctional Rhythm in the 50's BP better now 119/58

## 2015-01-06 NOTE — Progress Notes (Signed)
[facility] PROGRESS NOTE  Cheryl Zamora TMA:[id number] DOB: 08-08-1923 DOA: 01/02/2015  PCP: Geoffery Lyons, MD  Brief HPI: 81 year old Caucasian female with a past medical history of hypertension, coronary artery disease, recent diagnosis of paroxysmal atrial fibrillation on anticoagulation, chronic combined systolic and diastolic congestive heart failure, chronic kidney disease stage III, presented with palpitations. She was found to have an abnormal UA. CT suggested mild diverticulitis. She was found to have tachycardia and was noted to be in atrial fibrillation with RVR. She was hospitalized for further management.  Past medical history:  Past Medical History  Diagnosis Date  . Hyperlipidemia     pt denies this hx on 01/03/2015  . Hypertension   . Coronary artery disease 2005    a. s/p CABG (2012)  . Neuropathy ([place])   . Atrial fibrillation with rapid ventricular response ([place]) 11/2012; 10/2013    a. s/p TEE/DCCV 11/2012 & 10/2013 b. amiodarone caused significant QT prlongation (>672mec)  . Chronic combined systolic and diastolic CHF (congestive heart failure) ([place]   . Osteoarthritis of back   . GERD (gastroesophageal reflux disease)     Consultants: Cardiology  Procedures: Plan is for cardioversion 11/7  Antibiotics: Ceftriaxone 11/3  Subjective: Patient remains well. RN noted dark urine with some blood yesterday. Patient denies any abdominal pain, chest pain, shortness of breath, nausea or vomiting.   Objective: Vital Signs  Filed Vitals:   01/05/15 1625 01/06/15 0012 01/06/15 0416 01/06/15 0500  BP: 1'08/70 92/59 90/52 '$   Pulse: 129 135 99   Temp: 98 F (36.7 C) 98 F (36.7 C) 97.9 F (36.6 C)   TempSrc: Oral Oral Oral   Resp: '23 21 25   '$ Height:      Weight:    71.9 kg (158 lb 8.2 oz)  SpO2: 95% 97% 98%     Intake/Output Summary (Last 24 hours) at 01/06/15 0726 Last data filed at 01/06/15 0400  Gross per 24 hour  Intake   1088 ml  Output   2350  ml  Net  -1262 ml   Filed Weights   01/04/15 0420 01/05/15 0355 01/06/15 0500  Weight: 71.8 kg (158 lb 4.6 oz) 72.2 kg (159 lb 2.8 oz) 71.9 kg (158 lb 8.2 oz)    General appearance: alert, cooperative, appears stated age and no distress Resp: Improved air entry. Very few crackles at the bases, which diminish with deep breathing. No rhonchi.  Cardio: S1, S2 is irregularly irregular. No S3, S4. No rubs, murmurs, or bruit. No pedal edema. GI: soft, non-tender; bowel sounds normal; no masses,  no organomegaly Extremities: extremities normal, atraumatic, no cyanosis or edema Neurologic: Alert and oriented 3. No focal neurological deficits are noted.  Lab Results:  Basic Metabolic Panel:  Recent Labs Lab 01/02/15 0914 01/03/15 0504 01/04/15 0540 01/05/15 0237 01/06/15 0306  NA 133* 131* 127* 133* 134*  K 4.2 4.3 4.1 3.7 3.3*  CL 101 102 96* 101 99*  CO2 16* 16* 20* 23 24  GLUCOSE 156* 119* 129* 108* 148*  BUN 25* 24* 29* 28* 24*  CREATININE 1.84* 1.49* 1.90* 1.70* 1.33*  CALCIUM 8.9 8.1* 8.4* 8.6* 8.6*   Liver Function Tests:  Recent Labs Lab 01/02/15 0914  AST 35  ALT 18  ALKPHOS 65  BILITOT 1.9*  PROT 6.5  ALBUMIN 3.7    Recent Labs Lab 01/02/15 0914  LIPASE 43   CBC:  Recent Labs Lab 01/02/15 0914 01/04/15 0540 01/05/15 0237 01/06/15 0306  WBC 17.3* 15.5*  11.3* 11.2*  NEUTROABS 13.5*  --   --   --   HGB 12.2 10.8* 10.5* 10.1*  HCT 38.6 34.8* 33.9* 32.8*  MCV 79.1 78.9 78.5 79.0  PLT 309 279 282 266   Cardiac Enzymes:  Recent Labs Lab 01/02/15 0923  TROPONINI <0.03   BNP (last 3 results)  Recent Labs  04/10/14 1337 01/02/15 0923  BNP 413.8* 622.9*     Recent Results (from the past 240 hour(s))  Blood culture (routine x 2)     Status: None (Preliminary result)   Collection Time: 01/02/15 12:20 PM  Result Value Ref Range Status   Specimen Description BLOOD LEFT FOREARM  Final   Special Requests BOTTLES DRAWN AEROBIC ONLY 2ML  Final     Culture NO GROWTH 3 DAYS  Final   Report Status PENDING  Incomplete  Blood culture (routine x 2)     Status: None (Preliminary result)   Collection Time: 01/02/15 12:38 PM  Result Value Ref Range Status   Specimen Description BLOOD LEFT ANTECUBITAL  Final   Special Requests BOTTLES DRAWN AEROBIC AND ANAEROBIC 10ML  Final   Culture NO GROWTH 3 DAYS  Final   Report Status PENDING  Incomplete  MRSA PCR Screening     Status: None   Collection Time: 01/02/15  6:51 PM  Result Value Ref Range Status   MRSA by PCR NEGATIVE NEGATIVE Final    Comment:        The GeneXpert MRSA Assay (FDA approved for NASAL specimens only), is one component of a comprehensive MRSA colonization surveillance program. It is not intended to diagnose MRSA infection nor to guide or monitor treatment for MRSA infections.       Studies/Results: No results found.  Medications:  Scheduled: . cefTRIAXone (ROCEPHIN)  IV  1 g Intravenous Q24H  . docusate sodium  100 mg Oral BID  . furosemide  40 mg Oral Daily  . metoprolol tartrate  25 mg Oral BID  . potassium chloride  40 mEq Oral Once  . Rivaroxaban  15 mg Oral Q supper  . sodium chloride  3 mL Intravenous Q12H   Continuous: . sodium chloride    . sodium chloride 250 mL (01/05/15 1839)  . diltiazem (CARDIZEM) infusion 12.5 mg/hr (01/06/15 0650)   GGY:IRSWNIOEVOJJK, ondansetron, sodium chloride  Assessment/Plan:  Principal Problem:   Sepsis ([place]) Active Problems:   Chronic diastolic CHF (congestive heart failure), NYHA class 3 (HCC)   Hypertension   CKD (chronic kidney disease) stage 3, GFR 30-59 ml/min   UTI (lower urinary tract infection)   Atrial fibrillation with rapid ventricular response (HCC)    Atrial fibrillation with RVR Heart rate remains stable. Still in atrial fibrillation. She remains on diltiazem infusion as well as oral metoprolol. Cardiology is following closely. Defer management to them. It appears that plan is for  cardioversion today. Continue anticoagulation with Xarelto. Chadsvasc at least 5 ( CHF 1, HTN 1, Age 57, Female 1, CAD 1).  Sepsis secondary to UTI and perhaps mild diverticulitis Patient remains stable. Abdomen remains benign. I do not think she has diverticulitis based on my clinical assessment. Urine was abnormal. Blood cultures are negative so far. Unfortunately, urine culture was not sent. CBC shows improvement in WBC. Since plan is for a procedure we will leave her on IV antibiotics for now. Reports of blood in urine. Repeat UA.  Chronic systolic and diastolic congestive heart failure.  EF earlier this year was about 45%. Continue oral Lasix. Weight  is stable. Continue to monitor. Strict ins and outs and daily weights. Baseline weight appears to be around 70-71 kg.  Hyponatremia Could be secondary to volume overload. Improved. Continue to monitor while on Lasix.   Acute on chronic renal failure stage III Creatinine is improved. Hold off on further IV fluids. Continue Lasix. Continue to monitor.   History of essential hypertension Blood pressure is borderline low. Continue to monitor closely.  DVT Prophylaxis: On anticoagulation    Code Status: Full code  Family Communication: Discussed with the patient Disposition Plan: Continue current management. Cardiology is following as well. Plan is for cardioversion today.      LOS: 4 days   [place] Pager [phone] 01/06/2015, 7:26 AM  If 7PM-7AM, please contact night-coverage at [email or web address], password [facility]

## 2015-01-06 NOTE — Progress Notes (Signed)
Pt is having frequent urination that is dark with amounts around only 232m each time. Last urine was dark with bright red blood present, pt not complaining of pain. NP made aware

## 2015-01-06 NOTE — Transfer of Care (Signed)
Immediate Anesthesia Transfer of Care Note  Patient: Cheryl Zamora  Procedure(s) Performed: Procedure(s): CARDIOVERSION (N/A)  Patient Location: Endoscopy Unit  Anesthesia Type:MAC  Level of Consciousness: awake, alert  and oriented  Airway & Oxygen Therapy: Patient Spontanous Breathing and Patient connected to nasal cannula oxygen  Post-op Assessment: Report given to RN and Post -op Vital signs reviewed and stable  Post vital signs: Reviewed and stable  Last Vitals:  Filed Vitals:   01/06/15 1410  BP: 98/43  Pulse: 50  Temp:   Resp: 17    Complications: No apparent anesthesia complications

## 2015-01-06 NOTE — Progress Notes (Signed)
DR Maryland Pink called back orders received for now CBC

## 2015-01-06 NOTE — Progress Notes (Signed)
Per Dr Aundra Dubin, Cardizem stopped following cardioversion

## 2015-01-06 NOTE — Procedures (Addendum)
Electrical Cardioversion Procedure Note Cheryl Zamora [id number] 1924/02/27  Procedure: Electrical Cardioversion Indications:  Atrial Fibrillation  Procedure Details Consent: Risks of procedure as well as the alternatives and risks of each were explained to the (patient/caregiver).  Consent for procedure obtained. Time Out: Verified patient identification, verified procedure, site/side was marked, verified correct patient position, special equipment/implants available, medications/allergies/relevent history reviewed, required imaging and test results available.  Performed  Patient placed on cardiac monitor, pulse oximetry, supplemental oxygen as necessary.  Sedation given: Propofol per anesthesiology Pacer pads placed anterior and posterior chest.  Cardioverted 1 time(s).  Cardioverted at [place].  Evaluation Findings: Post procedure EKG shows: Junctional rhythm. Complications: Patient was bradycardic and hypotensive immediately post-procedure.  Diltiazem gtt was stopped.  She was given atropine 0.1 mg + ephedrine and HR went up to around 50 bpm with SBP in 90s.  She remains in a junctional rhythm.  She will be on a step-down floor.   Loralie Champagne 01/06/2015, 1:49 PM

## 2015-01-06 NOTE — Progress Notes (Signed)
DAILY PROGRESS NOTE  PCP: Geoffery Lyons, MD  Brief HPI: 81 year old Caucasian female with a past medical history of hypertension, coronary artery disease, recent diagnosis of paroxysmal atrial fibrillation on anticoagulation, chronic combined systolic and diastolic congestive heart failure, chronic kidney disease stage III, presented with palpitations. She was found to have an abnormal UA. CT suggested mild diverticulitis. She was found to have tachycardia and was noted to be in atrial fibrillation with RVR. She was hospitalized for further management.  Subjective:  Feels fine - no CP, SOB, dysuria.  No sensation of irregular/rapid HR.  Objective:  Vital Signs in the last 24 hours: Temp:  [97.4 F (36.3 C)-98 F (36.7 C)] 97.4 F (36.3 C) (11/07 0746) Pulse Rate:  [67-135] 102 (11/07 1050) Resp:  [16-25] 21 (11/07 0746) BP: (82-108)/(52-70) 83/54 mmHg (11/07 1050) SpO2:  [95 %-98 %] 98 % (11/07 0416) Weight:  [158 lb 8.2 oz (71.9 kg)] 158 lb 8.2 oz (71.9 kg) (11/07 0500)  Intake/Output from previous day: 11/06 0701 - 11/07 0700 In: 1088 [P.O.:960; I.V.:78; IV Piggyback:50] Out: 2350 [Urine:2350] Intake/Output from this shift: Total I/O In: -  Out: 250 [Urine:250]   TELE: Afib - rates from 80s-120s, currently 100-110.  General appearance: alert, cooperative, appears stated age and no distress Neck: no adenopathy, no carotid bruit and pulsatile Jugular Veins, but not elevated Lungs: clear to auscultation bilaterally - with exception of mild LLL crackles, normal percussion bilaterally and non-labored Heart: irregularly irregular rhythm, S1, S2 normal and NO M/R/G Abdomen: soft, non-tender; bowel sounds normal; no masses,  no organomegaly Extremities: extremities normal, atraumatic, no cyanosis or edema Pulses: 2+ and symmetric Neurologic: Grossly normal  Lab Results:  Recent Labs  01/05/15 0237 01/06/15 0306  WBC 11.3* 11.2*  HGB 10.5* 10.1*  PLT 282 266     Recent Labs  01/05/15 0237 01/06/15 0306  NA 133* 134*  K 3.7 3.3*  CL 101 99*  CO2 23 24  GLUCOSE 108* 148*  BUN 28* 24*  CREATININE 1.70* 1.33*   No results for input(s): TROPONINI in the last 72 hours.  Invalid input(s): CK, MB Hepatic Function Panel No results for input(s): PROT, ALBUMIN, AST, ALT, ALKPHOS, BILITOT, BILIDIR, IBILI in the last 72 hours. No results for input(s): CHOL in the last 72 hours. No results for input(s): PROTIME in the last 72 hours.  Imaging: No new study  Cardiac Studies: no new studies  Assessment/Plan:  Principal Problem:   Sepsis ([place]) Active Problems:   CAD, multiple vessel, hx CABG 2012   Atrial fibrillation with rapid ventricular response (HCC)   PAF (paroxysmal atrial fibrillation) (HCC)   Essential hypertension   CKD (chronic kidney disease) stage 3, GFR 30-59 ml/min   UTI (lower urinary tract infection)   Acute on chronic renal insufficiency (HCC)   Chronic diastolic CHF (congestive heart failure), NYHA class 3 (HCC)  Principal Problem:   Sepsis ([place]) -   UTI (lower urinary tract infection): remains borderline hypotensive.    Likely driving force for Afib.  On Abx for UTI per [facility].  Dark urine -- ? Hematuria.  concerning wityh Xarelto - monitor for true hematuria  Active Problems:   CAD, multiple vessel, hx CABG 2012 - no active angina despite RVR.    PAF (paroxysmal atrial fibrillation) (HCC) w/Atrial fibrillation with rapid ventricular response (HCC)  Rate is proving difficult to control with low BP a major limiting factor. -- with planned DCCV, can reduce CCB dose to allow for BP stability.  Weekend notes suggest potential DCCV today - in afternoon  Remains on Xarelto  This patients CHA2DS2-VASc Score and unadjusted Ischemic Stroke Rate (% per year) is equal to 9.7 % stroke rate/year from a score of 6  Above score calculated as 1 point each if present [CHF, HTN, DM, Vascular=MI/PAD/Aortic Plaque, Age if 65-74,  or Female] Above score calculated as 2 points each if present [Age > 75, or Stroke/TIA/TE]    Essential hypertension - antihypertensives on hold   CKD (chronic kidney disease) stage 3, GFR 30-59 ml/min -   Acute on chronic renal insufficiency (HCC)  Renal function improving - likely related to UTI     Chronic diastolic CHF (congestive heart failure), NYHA class 3 (HCC) - on low dose diuretic; likely made worse by Afib RVR.  Continue diuretic to avoid HF exacerbation  Does not appear to be volume overloaded; weights stable. Lungs are clear    LOS: 4 days    HARDING, DAVID W 01/06/2015, 11:07 AM

## 2015-01-07 ENCOUNTER — Encounter (HOSPITAL_COMMUNITY): Payer: Self-pay | Admitting: Cardiology

## 2015-01-07 DIAGNOSIS — N189 Chronic kidney disease, unspecified: Secondary | ICD-10-CM

## 2015-01-07 DIAGNOSIS — N289 Disorder of kidney and ureter, unspecified: Secondary | ICD-10-CM

## 2015-01-07 DIAGNOSIS — I2510 Atherosclerotic heart disease of native coronary artery without angina pectoris: Secondary | ICD-10-CM

## 2015-01-07 DIAGNOSIS — R319 Hematuria, unspecified: Secondary | ICD-10-CM | POA: Insufficient documentation

## 2015-01-07 LAB — BASIC METABOLIC PANEL
ANION GAP: 7 (ref 5–15)
BUN: 20 mg/dL (ref 6–20)
CO2: 25 mmol/L (ref 22–32)
Calcium: 9.1 mg/dL (ref 8.9–10.3)
Chloride: 101 mmol/L (ref 101–111)
Creatinine, Ser: 1.18 mg/dL — ABNORMAL HIGH (ref 0.44–1.00)
GFR, EST AFRICAN AMERICAN: 46 mL/min — AB (ref >60)
GFR, EST NON AFRICAN AMERICAN: 40 mL/min — AB (ref >60)
GLUCOSE: 116 mg/dL — AB (ref 65–99)
POTASSIUM: 4.4 mmol/L (ref 3.5–5.1)
Sodium: 133 mmol/L — ABNORMAL LOW (ref 135–145)

## 2015-01-07 LAB — MAGNESIUM: Magnesium: 2 mg/dL (ref 1.7–2.4)

## 2015-01-07 LAB — CBC
HEMATOCRIT: 33.8 % — AB (ref 36.0–46.0)
Hemoglobin: 10.3 g/dL — ABNORMAL LOW (ref 12.0–15.0)
MCH: 24.1 pg — ABNORMAL LOW (ref 26.0–34.0)
MCHC: 30.5 g/dL (ref 30.0–36.0)
MCV: 79.2 fL (ref 78.0–100.0)
PLATELETS: 278 10*3/uL (ref 150–400)
RBC: 4.27 MIL/uL (ref 3.87–5.11)
RDW: 16.5 % — ABNORMAL HIGH (ref 11.5–15.5)
WBC: 10.7 10*3/uL — AB (ref 4.0–10.5)

## 2015-01-07 LAB — CULTURE, BLOOD (ROUTINE X 2)
CULTURE: NO GROWTH
Culture: NO GROWTH

## 2015-01-07 MED ORDER — HYDROCODONE-ACETAMINOPHEN 5-325 MG PO TABS
2.0000 | ORAL_TABLET | Freq: Once | ORAL | Status: AC
  Administered 2015-01-08: 2 via ORAL
  Filled 2015-01-07: qty 2

## 2015-01-07 MED ORDER — HYDROCODONE-ACETAMINOPHEN 5-325 MG PO TABS: 1.0000 | ORAL_TABLET | ORAL | Status: DC | PRN

## 2015-01-07 NOTE — Progress Notes (Signed)
DAILY PROGRESS NOTE  PCP: Geoffery Lyons, MD  Brief HPI: 81 year old Caucasian female with a past medical history of hypertension, coronary artery disease, recent diagnosis of paroxysmal atrial fibrillation on anticoagulation, chronic combined systolic and diastolic congestive heart failure, chronic kidney disease stage III, presented with palpitations. She was found to have an abnormal UA. CT suggested mild diverticulitis. She was found to have tachycardia and was noted to be in atrial fibrillation with RVR. She was hospitalized for further management.  Successful cardioversion yesterday. It was complicated for a brief period by profound bradycardia requiring atropine and ephedrine. Finally stabilized and is now normal with rates in the 50s to 70s on telemetry.  Subjective:  Feels fine - no CP, SOB, dysuria.  No sensation of irregular/rapid HR. Son had many questions about potential discharge.  Objective:  Vital Signs in the last 24 hours: Temp:  [97.2 F (36.2 C)-98.6 F (37 C)] 97.4 F (36.3 C) (11/08 0700) Pulse Rate:  [50-129] 61 (11/08 0924) Resp:  [16-26] 22 (11/08 0900) BP: (83-136)/(42-112) 121/86 mmHg (11/08 0924) SpO2:  [97 %-100 %] 100 % (11/08 0900) Weight:  [172 lb 9.9 oz (78.3 kg)] 172 lb 9.9 oz (78.3 kg) (11/08 0500)  Intake/Output from previous day: 11/07 0701 - 11/08 0700 In: 1042.5 [P.O.:450; I.V.:542.5; IV Piggyback:50] Out: 6333 [Urine:1675] Intake/Output from this shift: Total I/O In: 240 [P.O.:240] Out: -    TELE: Afib - rates from 80s-120s, currently 100-110.  General appearance: alert, cooperative, appears stated age and no distress Neck: no adenopathy, no carotid bruit and pulsatile Jugular Veins, but not elevated Lungs: clear to auscultation bilaterally - with exception of mild LLL crackles, normal percussion bilaterally and non-labored Heart: RRR rhythm, S1, S2 normal and NO M/R/G Abdomen: soft, non-tender; bowel sounds normal; no masses,  no  organomegaly Extremities: extremities normal, atraumatic, no cyanosis or edema Pulses: 2+ and symmetric Neurologic: Grossly normal  Lab Results:  Recent Labs  01/06/15 1957 01/07/15 0331  WBC 11.5* 10.7*  HGB 10.5* 10.3*  PLT 268 278    Recent Labs  01/06/15 0306 01/07/15 0331  NA 134* 133*  K 3.3* 4.4  CL 99* 101  CO2 24 25  GLUCOSE 148* 116*  BUN 24* 20  CREATININE 1.33* 1.18*   No results for input(s): TROPONINI in the last 72 hours.  Invalid input(s): CK, MB Hepatic Function Panel No results for input(s): PROT, ALBUMIN, AST, ALT, ALKPHOS, BILITOT, BILIDIR, IBILI in the last 72 hours. No results for input(s): CHOL in the last 72 hours. No results for input(s): PROTIME in the last 72 hours.  Imaging: No new study  Cardiac Studies: November 7: Status post cardioversion yesterday with propofol sedation. After cardioversion she became bradycardic and hypotensive. Diltiazem was discontinued she was given atropine and ephedrine with improvement in blood pressures and heart rate. Temporarily in junctional rhythm. Stabilized and returned back to stepdown unit  Assessment/Plan:  Principal Problem:   Sepsis ([place]) Active Problems:   CAD, multiple vessel, hx CABG 2012   Atrial fibrillation with rapid ventricular response (HCC)   PAF (paroxysmal atrial fibrillation) (HCC)   Essential hypertension   CKD (chronic kidney disease) stage 3, GFR 30-59 ml/min   UTI (lower urinary tract infection)   Acute on chronic renal insufficiency (HCC)   Chronic diastolic CHF (congestive heart failure), NYHA class 3 (HCC)   Atrial fibrillation with RVR ([place])  Principal Problem:   Sepsis ([place]) -   UTI (lower urinary tract infection): remains borderline hypotensive.  Likely driving force for Afib.  On Abx for UTI per [facility].  Dark urine -- ? Hematuria.  concerning wityh Xarelto - monitor for true hematuria  Active Problems:   CAD, multiple vessel, hx CABG 2012 - no active angina  despite RVR.    PAF (paroxysmal atrial fibrillation) (HCC) w/Atrial fibrillation with rapid ventricular response ([place])  Underwent cardioversion for difficult to control rate yesterday.  Now in sinus rhythm and sinus bradycardia. Have stopped calcium channel blocker. Continue current dose of beta blocker. Would not be overly aggressive with borderline bradycardia.  Remains on Xarelto  This patients CHA2DS2-VASc Score and unadjusted Ischemic Stroke Rate (% per year) is equal to 9.7 % stroke rate/year from a score of 6  Above score calculated as 1 point each if present [CHF, HTN, DM, Vascular=MI/PAD/Aortic Plaque, Age if 65-74, or Female] Above score calculated as 2 points each if present [Age > 75, or Stroke/TIA/TE]    Essential hypertension - antihypertensives had been on hold. Now pressures are stable on low-dose beta blocker   CKD (chronic kidney disease) stage 3, GFR 30-59 ml/min -   Acute on chronic renal insufficiency (HCC)  Renal function improving - likely related to UTI    Chronic diastolic CHF (congestive heart failure), NYHA class 3 (HCC)  Continue diuretic to avoid HF exacerbation  Does not appear to be volume overloaded; weights stable. Lungs are clear  Is having some hematuria in the setting of recent UTI. Plan is for urology consultation per [facility]/Dr. Maryland Pink.     LOS: 5 days    HARDING, DAVID W 01/07/2015, 10:33 AM

## 2015-01-07 NOTE — Evaluation (Signed)
Physical Therapy Evaluation Patient Details Name: Cheryl Zamora MRN: [id number] DOB: 1923-12-22 Today's Date: 01/07/2015   History of Present Illness  [name] 81 y/o female presents with palpitations. In ED, pt was found to have an abnormal UA, found to have tachycardia and was noted to be in A-fib with RVR s/p cardioversion 11/7. Also noted to have Hematuria. PMH includes CAD, PAF on warfarin, CHF, HTN, HLD.  Clinical Impression  Patient presents with generalized weakness, bradycardia, balance deficits and memory deficits (premorbid?) impacting safe mobility and functional independence. Pt from home alone however concerned about pt returning home due to memory issues and functional deficits. Pt not able to state if family can provide 24/7 S or details about home setup/PLOF. Recommend ST SNF (with possible transfer to ALF) to maximize independence and mobility prior to return home.   Follow Up Recommendations SNF;Supervision for mobility/OOB    Equipment Recommendations  None recommended by PT    Recommendations for Other Services       Precautions / Restrictions Precautions Precautions: Fall Restrictions Weight Bearing Restrictions: No      Mobility  Bed Mobility               General bed mobility comments: Sitting on EOB upon PT arrival.   Transfers Overall transfer level: Needs assistance Equipment used: Rolling walker (2 wheeled) Transfers: Stand Pivot Transfers Sit to Stand: Min assist Stand pivot transfers: Min assist       General transfer comment: Min A to boost from EOB x2, from bSC x1. Transferred to chair post ambulation. SPT bed to/from [facility].  Ambulation/Gait Ambulation/Gait assistance: Min guard Ambulation Distance (Feet): 20 Feet Assistive device: Rolling walker (2 wheeled) Gait Pattern/deviations: Step-through pattern;Decreased stride length Gait velocity: decreased   General Gait Details: Slow, mildly unsteady gait. DOE.  Stairs            Wheelchair Mobility    Modified Rankin (Stroke Patients Only)       Balance Overall balance assessment: Needs assistance Sitting-balance support: Feet supported;No upper extremity supported Sitting balance-Leahy Scale: Fair     Standing balance support: During functional activity;Single extremity supported Standing balance-Leahy Scale: Fair Standing balance comment: Tolerated pericare in standing with 1 UE support.                              Pertinent Vitals/Pain Pain Assessment: No/denies pain    Home Living Family/patient expects to be discharged to:: Skilled nursing facility Living Arrangements: Alone Available Help at Discharge: Family Type of Home: House Home Access: Level entry     Home Layout: One level Home Equipment:  ("I don't remember what I have, I have not been home in awhile")      Prior Function Level of Independence: Independent with assistive device(s)         Comments: Uses RW for ambulation PRN. Drives.      Hand Dominance        Extremity/Trunk Assessment   Upper Extremity Assessment: Defer to OT evaluation           Lower Extremity Assessment: Generalized weakness         Communication   Communication: HOH  Cognition Arousal/Alertness: Awake/alert Behavior During Therapy: WFL for tasks assessed/performed Overall Cognitive Status: No family/caregiver present to determine baseline cognitive functioning (Able to state it was election day with clues, does not know why she is in hospital.)       Memory:  Decreased short-term memory (I have no memory anymore.)              General Comments General comments (skin integrity, edema, etc.): No family members present during session.     Exercises        Assessment/Plan    PT Assessment Patient needs continued PT services  PT Diagnosis Difficulty walking;Generalized weakness   PT Problem List Decreased strength;Decreased cognition;Decreased  balance;Decreased mobility;Decreased activity tolerance  PT Treatment Interventions Balance training;Gait training;Functional mobility training;Therapeutic exercise;Therapeutic activities;Patient/family education   PT Goals (Current goals can be found in the Care Plan section) Acute Rehab PT Goals Patient Stated Goal: none stated PT Goal Formulation: With patient Time For Goal Achievement: 01/21/15 Potential to Achieve Goals: Fair    Frequency Min 3X/week   Barriers to discharge Decreased caregiver support Pt lives alone    Co-evaluation               End of Session Equipment Utilized During Treatment: Gait belt Activity Tolerance: Patient tolerated treatment well Patient left: in chair;with call bell/phone within reach Nurse Communication: Mobility status         Time: 7416-3845 PT Time Calculation (min) (ACUTE ONLY): 24 min   Charges:   PT Evaluation $Initial PT Evaluation Tier I: 1 Procedure PT Treatments $Gait Training: 8-22 mins   PT G Codes:        Cheryl Zamora A Dayna Geurts 01/07/2015, 4:51 PM Wray Kearns, [place], DPT [phone]

## 2015-01-07 NOTE — Progress Notes (Signed)
UR Completed. Ladiamond Gallina, RN, BSN.  [phone] 

## 2015-01-07 NOTE — Progress Notes (Signed)
[facility] PROGRESS NOTE  JAMINA MACBETH KYH:[id number] DOB: 1923/03/09 DOA: 01/02/2015  PCP: Geoffery Lyons, MD  Brief HPI: 81 year old Caucasian female with a past medical history of hypertension, coronary artery disease, recent diagnosis of paroxysmal atrial fibrillation on anticoagulation, chronic combined systolic and diastolic congestive heart failure, chronic kidney disease stage III, presented with palpitations. She was found to have an abnormal UA. CT suggested mild diverticulitis. She was found to have tachycardia and was noted to be in atrial fibrillation with RVR. She was hospitalized for further management. Patient underwent cardioversion on 11/7. Noted to have persistent hematuria. Urology was consulted.  Past medical history:  Past Medical History  Diagnosis Date  . Hyperlipidemia     pt denies this hx on 01/03/2015  . Hypertension   . Coronary artery disease 2005    a. s/p CABG (2012)  . Neuropathy ([place])   . Atrial fibrillation with rapid ventricular response ([place]) 11/2012; 10/2013    a. s/p TEE/DCCV 11/2012 & 10/2013 b. amiodarone caused significant QT prlongation (>682mec)  . Chronic combined systolic and diastolic CHF (congestive heart failure) [place]   . Osteoarthritis of back   . GERD (gastroesophageal reflux disease)     Consultants: Cardiology. Urology  Procedures: Cardioversion 11/7  Antibiotics: Ceftriaxone 11/3  Subjective: Patient denies any complaints. No chest pain, shortness of breath. Tolerated her cardioversion well without any difficulty. No nausea, vomiting. Continues to have dark/bloody urine.  Objective: Vital Signs  Filed Vitals:   01/07/15 0500 01/07/15 0700 01/07/15 0900 01/07/15 0924  BP:  136/55 121/86 121/86  Pulse:  53 55 61  Temp:  97.4 F (36.3 C)    TempSrc:  Oral    Resp:  20 22   Height:      Weight: 78.3 kg (172 lb 9.9 oz)     SpO2:  99% 100%     Intake/Output Summary (Last 24 hours) at 01/07/15 1047 Last  data filed at 01/07/15 0900  Gross per 24 hour  Intake 1262.5 ml  Output   1425 ml  Net -162.5 ml   Filed Weights   01/05/15 0355 01/06/15 0500 01/07/15 0500  Weight: 72.2 kg (159 lb 2.8 oz) 71.9 kg (158 lb 8.2 oz) 78.3 kg (172 lb 9.9 oz)    General appearance: alert, cooperative, appears stated age and no distress Resp: Improved air entry. Very few crackles at the bases, which diminish with deep breathing. No rhonchi.  Cardio: S1, S2 is irregularly irregular. No S3, S4. No rubs, murmurs, or bruit. No pedal edema. GI: soft, non-tender; bowel sounds normal; no masses,  no organomegaly Neurologic: Alert and oriented 3. No focal neurological deficits are noted.  Lab Results:  Basic Metabolic Panel:  Recent Labs Lab 01/03/15 0504 01/04/15 0540 01/05/15 0237 01/06/15 0306 01/07/15 0331  NA 131* 127* 133* 134* 133*  K 4.3 4.1 3.7 3.3* 4.4  CL 102 96* 101 99* 101  CO2 16* 20* '23 24 25  '$ GLUCOSE 119* 129* 108* 148* 116*  BUN 24* 29* 28* 24* 20  CREATININE 1.49* 1.90* 1.70* 1.33* 1.18*  CALCIUM 8.1* 8.4* 8.6* 8.6* 9.1  MG  --   --   --   --  2.0   Liver Function Tests:  Recent Labs Lab 01/02/15 0914  AST 35  ALT 18  ALKPHOS 65  BILITOT 1.9*  PROT 6.5  ALBUMIN 3.7    Recent Labs Lab 01/02/15 0914  LIPASE 43   CBC:  Recent Labs Lab 01/02/15 0914 01/04/15  1610 01/05/15 0237 01/06/15 0306 01/06/15 1957 01/07/15 0331  WBC 17.3* 15.5* 11.3* 11.2* 11.5* 10.7*  NEUTROABS 13.5*  --   --   --  8.2*  --   HGB 12.2 10.8* 10.5* 10.1* 10.5* 10.3*  HCT 38.6 34.8* 33.9* 32.8* 34.4* 33.8*  MCV 79.1 78.9 78.5 79.0 80.0 79.2  PLT 309 279 282 266 268 278   Cardiac Enzymes:  Recent Labs Lab 01/02/15 0923  TROPONINI <0.03   BNP (last 3 results)  Recent Labs  04/10/14 1337 01/02/15 0923  BNP 413.8* 622.9*     Recent Results (from the past 240 hour(s))  Blood culture (routine x 2)     Status: None (Preliminary result)   Collection Time: 01/02/15 12:20 PM    Result Value Ref Range Status   Specimen Description BLOOD LEFT FOREARM  Final   Special Requests BOTTLES DRAWN AEROBIC ONLY 2ML  Final   Culture NO GROWTH 4 DAYS  Final   Report Status PENDING  Incomplete  Blood culture (routine x 2)     Status: None (Preliminary result)   Collection Time: 01/02/15 12:38 PM  Result Value Ref Range Status   Specimen Description BLOOD LEFT ANTECUBITAL  Final   Special Requests BOTTLES DRAWN AEROBIC AND ANAEROBIC 10ML  Final   Culture NO GROWTH 4 DAYS  Final   Report Status PENDING  Incomplete  MRSA PCR Screening     Status: None   Collection Time: 01/02/15  6:51 PM  Result Value Ref Range Status   MRSA by PCR NEGATIVE NEGATIVE Final    Comment:        The GeneXpert MRSA Assay (FDA approved for NASAL specimens only), is one component of a comprehensive MRSA colonization surveillance program. It is not intended to diagnose MRSA infection nor to guide or monitor treatment for MRSA infections.       Studies/Results: No results found.  Medications:  Scheduled: . cefTRIAXone (ROCEPHIN)  IV  1 g Intravenous Q24H  . docusate sodium  100 mg Oral BID  . metoprolol tartrate  25 mg Oral BID  . Rivaroxaban  15 mg Oral Q supper  . sodium chloride  3 mL Intravenous Q12H   Continuous: . diltiazem (CARDIZEM) infusion Stopped (01/06/15 1335)   RUE:[id number], hydrocortisone cream, ondansetron, sodium chloride  Assessment/Plan:  Principal Problem:   Sepsis ([place]) Active Problems:   PAF (paroxysmal atrial fibrillation) (HCC)   Chronic diastolic CHF (congestive heart failure), NYHA class 3 (HCC)   Essential hypertension   CKD (chronic kidney disease) stage 3, GFR 30-59 ml/min   CAD, multiple vessel, hx CABG 2012   UTI (lower urinary tract infection)   Atrial fibrillation with rapid ventricular response (HCC)   Acute on chronic renal insufficiency (HCC)   Atrial fibrillation with RVR (HCC)    Atrial fibrillation with RVR [name] status  post cardioversion. Heart rate is in sinus rhythm now. Post cardioversion. She was in junctional rhythm for many hours. Blood pressure has improved. Continue metoprolol. Discussed with Dr. Ellyn Hack. Continue anticoagulation with Xarelto. Chadsvasc at least 5 ( CHF 1, HTN 1, Age 50, Female 1, CAD 1).  Hematuria  In the setting of anticoagulation. CT of the abdomen and pelvis that was done a few days ago was reviewed. She does have a renal cyst and a nonobstructing calculus. Discussed with Dr. Louis Meckel with urology. He will consult on this patient. Will need to discuss with cardiology if recommendation is to hold anticoagulation, considering recent cardioversion.  Sepsis secondary  to UTI and perhaps mild diverticulitis Patient remains stable. Abdomen remains benign. I do not think she has diverticulitis based on my clinical assessment. Urine was abnormal. Blood cultures are negative so far. Unfortunately, urine culture was not sent. CBC shows improvement in WBC. She remains on intravenous ceftriaxone. Continue for now pending urology input.   Chronic systolic and diastolic congestive heart failure.  EF earlier this year was about 45%. Continue oral Lasix. Weight had been stable. Appears to be incorrect this morning (78 kg). Continue to monitor. Strict ins and outs and daily weights. Baseline weight appears to be around 70-71 kg.  Hyponatremia Improved. Could have been secondary to volume overload. Continue to monitor while on Lasix.   Acute on chronic renal failure stage III Creatinine is improved. Hold off on further IV fluids. Continue Lasix. Continue to monitor.   History of essential hypertension Blood pressure has improved.  DVT Prophylaxis: On anticoagulation    Code Status: Full code  Family Communication: Discussed with the patient Disposition Plan: Okay for transfer to floor. PT and OT to mobilize patient. Await urology input.      LOS: 5 days   [place]  [facility] Pager [phone] 01/07/2015, 10:47 AM  If 7PM-7AM, please contact night-coverage at [email or web address], password [facility]

## 2015-01-07 NOTE — Consult Note (Signed)
Urology Consult   Physician requesting consult: Bonnielee Haff  Reason for consult: Hematuria  History of Present Illness: Cheryl Zamora is a 81 y.o. female with PMH significant for afib with RVR on Xarelto, HTN, hyperlipidemia, CAD s/p CABG, CKD III, and chronic CHF who was admitted on 01/02/15 for eval of palpitations.  Pt had successful cardioversion yesterday for afib with RVR. During hospitalization pt was noted to have a UTI and microscopic hematuria.  She was started on Rocephin but a urine culture was never obtained.  WBC down from 11.3 to 10.7.  CT A/P revealed a 2.4 cm left upper pole renal cyst and an 8.57m non obstructive calculus in the left extrarenal pelvis with no hydronephrosis or hydroureter.    She denies a history of voiding or storage urinary symptoms, hematuria, UTIs, urolithiasis, GU malignancy/trauma/surgery.  Review of all available UAs reveal persistent significant microscopic hematuria dating back to Sept 2015.  Pt is uncertain of how long she has been on anticoagulation but thinks it has been for "several years".  She smoked for 1ppd for approx 63 years and quit in 2006.  She was a dMerchandiser, retailfor [employer] was never exposed to asbestos or chemicals as far as she knows.    She is currently resting comfortably and is without complaint.  She denies F/C, HA, CP, SOB, N/V, diarrhea, and abdominal pain.  She states she has not had a BM since admission.   Past Medical History  Diagnosis Date  . Hyperlipidemia     pt denies this hx on 01/03/2015  . Hypertension   . Coronary artery disease 2005    a. s/p CABG (2012)  . Neuropathy ([facility]   . Atrial fibrillation [place] rapid ventricular response ([facility] 11/2012; 10/2013    a. s/p TEE/DCCV 11/2012 & 10/2013 b. amiodarone caused significant QT prlongation (>6033mc)  . Chronic combined systolic and diastolic CHF (congestive heart failure) ([facility]  . Osteoarthritis of back   . GERD (gastroesophageal reflux disease)     Past Surgical  History  Procedure Laterality Date  . Tonsillectomy    . Cervical disc surgery  1960's    "ruptured disc'  . Tee without cardioversion N/A 12/13/2012    Procedure: TRANSESOPHAGEAL ECHOCARDIOGRAM (TEE);  Surgeon: DaLarey DresserMD;  [facility]: [facility];  Laterality: N/A;  . Cardioversion N/A 12/13/2012    Procedure: CARDIOVERSION;  Surgeon: DaLarey DresserMD;  [facility]: Cardiovascular;  Laterality: N/A;  . Tee without cardioversion N/A 11/13/2013    Procedure: TRANSESOPHAGEAL ECHOCARDIOGRAM (TEE);  Surgeon: KaDorothy SparkMD;  [facility]: Cardi[facility];  Laterality: N/A;  . Cardioversion N/A 11/13/2013    Procedure: CARDIOVERSION;  Surgeon: KaDorothy SparkMD;  [facility]: [facility];  Laterality: N/A;  . Cardioversion N/A 04/12/2014    Procedure: CARDIOVERSION;  Surgeon: PhThayer HeadingsMD;  [facility];  Service: Cardiovascular;  Laterality: N/A;  . Back surgery    . Abdominal hysterectomy    . Cardiac catheterization  04/12/2003    L[place] 60, LAD 90, D1 30, CFX 95->T, RCA OK  . Coronary artery bypass graft  04/18/2003    x 4; LIMA-LAD, SVG-D1, SVG-OM1-OM2  . Coronary angioplasty    . Cardioversion N/A 01/06/2015    Procedure: CARDIOVERSION;  Surgeon: DaLarey DresserMD;  [place] [facility] Service: Cardiovascular;  Laterality: N/A;     Current Hospital Medications:  Home meds:  Medication List    ASK your doctor about these medications        furosemide 40 MG tablet  Commonly known as:  LASIX  Take 1 tablet (40 mg total) by mouth daily. NEED OV.     memantine 10 MG tablet  Commonly known as:  NAMENDA  Take 10 mg by mouth daily.     metoprolol succinate 25 MG 24 hr tablet  Commonly known as:  TOPROL-XL  TAKE 1 TABLET BY MOUTH DAILY     potassium chloride SA 20 MEQ tablet  Commonly known as:  K-DUR,KLOR-CON  TAKE 1 TABLET BY MOUTH DAILY      Rivaroxaban 15 MG Tabs tablet  Commonly known as:  XARELTO  Take 15 mg by mouth daily.        Scheduled Meds: . cefTRIAXone (ROCEPHIN)  IV  1 g Intravenous Q24H  . docusate sodium  100 mg Oral BID  . metoprolol tartrate  25 mg Oral BID  . Rivaroxaban  15 mg Oral Q supper  . sodium chloride  3 mL Intravenous Q12H   Continuous Infusions: . diltiazem (CARDIZEM) infusion Stopped (01/06/15 1335)   PRN Meds:.acetaminophen, hydrocortisone cream, ondansetron, sodium chloride  Allergies:  Allergies  Allergen Reactions  . Amiodarone     Excessive QT prolongation, nausea  . Codeine Nausea And Vomiting  . Penicillins Nausea Only    Family History  Problem Relation Age of Onset  . Heart attack Mother   . Heart attack Father   . Heart attack Brother   . Heart attack Brother   . Heart attack Brother   . Heart attack Brother     Social History:  reports that she has quit smoking. Her smoking use included Cigarettes. She has a 60 pack-year smoking history. She has never used smokeless tobacco. She reports that she drinks alcohol. She reports that she does not use illicit drugs.  ROS: A complete review of systems was performed.  All systems are negative except for pertinent findings as noted.  Physical Exam:  Vital signs in last 24 hours: Temp:  [97.2 F (36.2 C)-98.6 F (37 C)] 98 F (36.7 C) (11/08 1302) Pulse Rate:  [52-61] 52 (11/08 1302) Resp:  [19-26] 19 (11/08 1302) BP: (117-136)/(55-112) 117/56 mmHg (11/08 1302) SpO2:  [99 %-100 %] 100 % (11/08 1302) Weight:  [78.3 kg (172 lb 9.9 oz)] 78.3 kg (172 lb 9.9 oz) (11/08 0500) Constitutional:  Alert and oriented, No acute distress Cardiovascular: Regular rate and rhythm Respiratory: Normal respiratory effort GI: Abdomen is soft, nontender, nondistended, no abdominal masses GU: No CVA tenderness Lymphatic: No lymphadenopathy Neurologic: Grossly intact, no focal deficits Psychiatric: Normal mood and affect  Laboratory  Data:   Recent Labs  01/05/15 0237 01/06/15 0306 01/06/15 1957 01/07/15 0331  WBC 11.3* 11.2* 11.5* 10.7*  HGB 10.5* 10.1* 10.5* 10.3*  HCT 33.9* 32.8* 34.4* 33.8*  PLT 282 266 268 278     Recent Labs  01/05/15 0237 01/06/15 0306 01/07/15 0331  NA 133* 134* 133*  K 3.7 3.3* 4.4  CL 101 99* 101  GLUCOSE 108* 148* 116*  BUN 28* 24* 20  CALCIUM 8.6* 8.6* 9.1  CREATININE 1.70* 1.33* 1.18*     Results for orders placed or performed during the hospital encounter of 01/02/15 (from the past 24 hour(s))  CBC with Differential/Platelet     Status: Abnormal   Collection Time: 01/06/15  7:57 PM  Result Value Ref Range   WBC 11.5 (H) 4.0 -  10.5 K/uL   RBC 4.30 3.87 - 5.11 MIL/uL   Hemoglobin 10.5 (L) 12.0 - 15.0 g/dL   HCT 34.4 (L) 36.0 - 46.0 %   MCV 80.0 78.0 - 100.0 fL   MCH 24.4 (L) 26.0 - 34.0 pg   MCHC 30.5 30.0 - 36.0 g/dL   RDW 16.5 (H) 11.5 - 15.5 %   Platelets 268 150 - 400 K/uL   Neutrophils Relative % 72 %   Neutro Abs 8.2 (H) 1.7 - 7.7 K/uL   Lymphocytes Relative 18 %   Lymphs Abs 2.1 0.7 - 4.0 K/uL   Monocytes Relative 9 %   Monocytes Absolute 1.1 (H) 0.1 - 1.0 K/uL   Eosinophils Relative 1 %   Eosinophils Absolute 0.1 0.0 - 0.7 K/uL   Basophils Relative 0 %   Basophils Absolute 0.1 0.0 - 0.1 K/uL  CBC     Status: Abnormal   Collection Time: 01/07/15  3:31 AM  Result Value Ref Range   WBC 10.7 (H) 4.0 - 10.5 K/uL   RBC 4.27 3.87 - 5.11 MIL/uL   Hemoglobin 10.3 (L) 12.0 - 15.0 g/dL   HCT 33.8 (L) 36.0 - 46.0 %   MCV 79.2 78.0 - 100.0 fL   MCH 24.1 (L) 26.0 - 34.0 pg   MCHC 30.5 30.0 - 36.0 g/dL   RDW 16.5 (H) 11.5 - 15.5 %   Platelets 278 150 - 400 K/uL  Basic metabolic panel     Status: Abnormal   Collection Time: 01/07/15  3:31 AM  Result Value Ref Range   Sodium 133 (L) 135 - 145 mmol/L   Potassium 4.4 3.5 - 5.1 mmol/L   Chloride 101 101 - 111 mmol/L   CO2 25 22 - 32 mmol/L   Glucose, Bld 116 (H) 65 - 99 mg/dL   BUN 20 6 - 20 mg/dL    Creatinine, Ser 1.18 (H) 0.44 - 1.00 mg/dL   Calcium 9.1 8.9 - 10.3 mg/dL   GFR calc non Af Amer 40 (L) >60 mL/min   GFR calc Af Amer 46 (L) >60 mL/min   Anion gap 7 5 - 15  Magnesium     Status: None   Collection Time: 01/07/15  3:31 AM  Result Value Ref Range   Magnesium 2.0 1.7 - 2.4 mg/dL   Recent Results (from the past 240 hour(s))  Blood culture (routine x 2)     Status: None   Collection Time: 01/02/15 12:20 PM  Result Value Ref Range Status   Specimen Description BLOOD LEFT FOREARM  Final   Special Requests BOTTLES DRAWN AEROBIC ONLY 2ML  Final   Culture NO GROWTH 5 DAYS  Final   Report Status 01/07/2015 FINAL  Final  Blood culture (routine x 2)     Status: None   Collection Time: 01/02/15 12:38 PM  Result Value Ref Range Status   Specimen Description BLOOD LEFT ANTECUBITAL  Final   Special Requests BOTTLES DRAWN AEROBIC AND ANAEROBIC 10ML  Final   Culture NO GROWTH 5 DAYS  Final   Report Status 01/07/2015 FINAL  Final  MRSA PCR Screening     Status: None   Collection Time: 01/02/15  6:51 PM  Result Value Ref Range Status   MRSA by PCR NEGATIVE NEGATIVE Final    Comment:        The GeneXpert MRSA Assay (FDA approved for NASAL specimens only), is one component of a comprehensive MRSA colonization surveillance program. It is not intended to diagnose  MRSA infection nor to guide or monitor treatment for MRSA infections.     Renal Function:  Recent Labs  01/02/15 0914 01/03/15 0504 01/04/15 0540 01/05/15 0237 01/06/15 0306 01/07/15 0331  CREATININE 1.84* 1.49* 1.90* 1.70* 1.33* 1.18*   Estimated Creatinine Clearance: 32 mL/min (by C-G formula based on Cr of 1.18).  Radiologic Imaging: CLINICAL DATA: Abdominal pain, chronic kidney disease  EXAM: CT ABDOMEN AND PELVIS WITHOUT CONTRAST  TECHNIQUE: Multidetector CT imaging of the abdomen and pelvis was performed following the standard protocol without IV contrast.  COMPARISON:  07/21/2004  FINDINGS: Sagittal images of the spine shows osteopenia and degenerative changes thoracolumbar spine.  Lung bases are unremarkable. Unenhanced liver shows no biliary ductal dilatation. No calcified gallstones are noted within gallbladder. Atherosclerotic calcifications of abdominal aorta, SMA, splenic artery and bilateral common iliac arteries. No aortic aneurysm.  Unenhanced kidneys are symmetrical in size. Bilateral renal cortical thinning probable due to atrophy. There is a cyst in upper pole of the left kidney measures 2.4 cm. There is nonobstructive calculus in the left extrarenal pelvis measures 8.7 mm just above the left UPJ. No hydronephrosis or hydroureter. No calcified ureteral calculi.  No small bowel obstruction. No ascites or free air. No adenopathy. Normal appendix is noted in axial image 59. No pericecal inflammation.  Axial image 56 there is mild stranding of pericolonic fat in proximal sigmoid colon. Multiple sigmoid colon diverticula are noted. Mild diverticulitis cannot be excluded. There is no evidence of abscess or perforation. No extraluminal air. No free abdominal air.  The uterus is atrophic. No adnexal mass. Moderate stool noted within rectum. The urinary bladder is under distended grossly unremarkable.  Small hiatal hernia again noted.  IMPRESSION: 1. There is mild stranding of pericolonic fat in proximal sigmoid colon axial image 56. Colonic diverticula are noted. Findings are suspicious for very mild diverticulitis. No abscess or perforation. 2. Normal appendix. No pericecal inflammation. 3. There is a cyst in upper pole of the left kidney measures 2.4 cm. Nonobstructive calcified calculus in left extrarenal pelvis measures 8.7 mm. No hydronephrosis or hydroureter. 4. No calcified ureteral calculi. 5. No small bowel obstruction.   Electronically Signed  By: Lahoma Crocker M.D.  On: 01/02/2015  12:25  Impression/Recommendation: Hematuria--The calculus in the extrarenal pelvis and the UTI may contribute to hematuria especially in the setting of anticoagulation, however, pt has had significant microscopic hematuria for at least a year.  She has been on anticoagulation during that time period but there is no way to determine how much effect this may have.  Pt has a significant smoking hx and she would benefit from a cystoscopy to eval her bladder.  This can be done as an outpatient after she is stabilized from her current medical issues.  She may also need a CT A/P with/without contrast to completely eval the GU system.  Renal cyst--benign and can be monitored.    Calculus in extrarenal pelvis--this has not caused any issues for the pt up to this time, however, it is large and has the potential to be a nidus for infection.  Dr Louis Meckel will address if the stone requires treatment.  If so, the pt would have to be off anticoagulation and stable from a cardiac standpoint.   UTI--continue Abx.  F/U UA to ensure resolution as no culture was obtained to direct therapy.    Chaneka Trefz 01/07/2015, 3:33 PM

## 2015-01-08 LAB — BASIC METABOLIC PANEL
ANION GAP: 9 (ref 5–15)
BUN: 17 mg/dL (ref 6–20)
CHLORIDE: 100 mmol/L — AB (ref 101–111)
CO2: 24 mmol/L (ref 22–32)
Calcium: 9 mg/dL (ref 8.9–10.3)
Creatinine, Ser: 1.19 mg/dL — ABNORMAL HIGH (ref 0.44–1.00)
GFR calc non Af Amer: 39 mL/min — ABNORMAL LOW (ref >60)
GFR, EST AFRICAN AMERICAN: 46 mL/min — AB (ref >60)
GLUCOSE: 132 mg/dL — AB (ref 65–99)
Potassium: 3.9 mmol/L (ref 3.5–5.1)
Sodium: 133 mmol/L — ABNORMAL LOW (ref 135–145)

## 2015-01-08 LAB — CBC
HEMATOCRIT: 34.6 % — AB (ref 36.0–46.0)
HEMOGLOBIN: 10.6 g/dL — AB (ref 12.0–15.0)
MCH: 24.2 pg — ABNORMAL LOW (ref 26.0–34.0)
MCHC: 30.6 g/dL (ref 30.0–36.0)
MCV: 79 fL (ref 78.0–100.0)
Platelets: 273 10*3/uL (ref 150–400)
RBC: 4.38 MIL/uL (ref 3.87–5.11)
RDW: 16.4 % — ABNORMAL HIGH (ref 11.5–15.5)
WBC: 11.7 10*3/uL — AB (ref 4.0–10.5)

## 2015-01-08 MED ORDER — PROMETHAZINE HCL 25 MG/ML IJ SOLN
12.5000 mg | Freq: Once | INTRAMUSCULAR | Status: AC
  Administered 2015-01-08: 12.5 mg via INTRAVENOUS
  Filled 2015-01-08: qty 1

## 2015-01-08 MED ORDER — METOPROLOL TARTRATE 12.5 MG HALF TABLET: 12.5000 mg | ORAL_TABLET | Freq: Every day | ORAL | Status: DC

## 2015-01-08 MED ORDER — FUROSEMIDE 40 MG PO TABS: 40.0000 mg | ORAL_TABLET | Freq: Every day | ORAL | Status: DC

## 2015-01-08 MED ORDER — FUROSEMIDE 10 MG/ML IJ SOLN
40.0000 mg | Freq: Once | INTRAMUSCULAR | Status: AC
  Administered 2015-01-08: 40 mg via INTRAVENOUS
  Filled 2015-01-08: qty 4

## 2015-01-08 MED ORDER — METOPROLOL TARTRATE 25 MG PO TABS: 25.0000 mg | ORAL_TABLET | Freq: Every day | ORAL | Status: DC

## 2015-01-08 NOTE — Discharge Summary (Signed)
Cheryl Zamora, is a 81 y.o. female  DOB 02-Oct-1923  MRN [id number].  Admission date:  01/02/2015  Admitting Physician  Waldemar Dickens, MD  Discharge Date:  01/08/2015   Primary MD  Geoffery Lyons, MD  Recommendations for primary care physician for things to follow:  - Please check CBC, BMP within 3 days - Patient to follow with nephrology as an outpatient regarding renal stone.   Admission Diagnosis  Atrial fibrillation with rapid ventricular response (HCC) [I48.91] Atrial fibrillation with RVR (HCC) [I48.91] Abdominal pain [R10.9] Sepsis, due to unspecified organism ([place]) [A41.9] Urinary tract infection without hematuria, site unspecified [N39.0]   Discharge Diagnosis  Atrial fibrillation with rapid ventricular response (HCC) [I48.91] Atrial fibrillation with RVR (HCC) [I48.91] Abdominal pain [R10.9] Sepsis, due to unspecified organism ([place]) [A41.9] Urinary tract infection without hematuria, site unspecified [N39.0]    Principal Problem:   Sepsis ([place]) Active Problems:   PAF (paroxysmal atrial fibrillation) (HCC)   Chronic diastolic CHF (congestive heart failure), NYHA class 3 (HCC)   Essential hypertension   CKD (chronic kidney disease) stage 3, GFR 30-59 ml/min   CAD, multiple vessel, hx CABG 2012   UTI (lower urinary tract infection)   Atrial fibrillation with rapid ventricular response (HCC)   Acute on chronic renal insufficiency (HCC)   Atrial fibrillation with RVR (HCC)   Hematuria      Past Medical History  Diagnosis Date  . Hyperlipidemia     pt denies this hx on 01/03/2015  . Hypertension   . Coronary artery disease 2005    a. s/p CABG (2012)  . Neuropathy ([place])   . Atrial fibrillation with rapid ventricular response ([place]) 11/2012; 10/2013    a. s/p TEE/DCCV 11/2012 & 10/2013 b. amiodarone caused significant QT prlongation (>665mec)  . Chronic combined systolic and  diastolic CHF (congestive heart failure) ([place]   . Osteoarthritis of back   . GERD (gastroesophageal reflux disease)     Past Surgical History  Procedure Laterality Date  . Tonsillectomy    . Cervical disc surgery  1960's    "ruptured disc'  . Tee without cardioversion N/A 12/13/2012    Procedure: TRANSESOPHAGEAL ECHOCARDIOGRAM (TEE);  Surgeon: DLarey Dresser MD;  [facility]  Service: Cardiovascular;  Laterality: N/A;  . Cardioversion N/A 12/13/2012    Procedure: CARDIOVERSION;  Surgeon: DLarey Dresser MD;  [facility]  Service: Cardiovascular;  Laterality: N/A;  . Tee without cardioversion N/A 11/13/2013    Procedure: TRANSESOPHAGEAL ECHOCARDIOGRAM (TEE);  Surgeon: KDorothy Spark MD;  [facility]  Service: Cardiovascular;  Laterality: N/A;  . Cardioversion N/A 11/13/2013    Procedure: CARDIOVERSION;  Surgeon: KDorothy Spark MD;  [facility]  Service: Cardiovascular;  Laterality: N/A;  . Cardioversion N/A 04/12/2014    Procedure: CARDIOVERSION;  Surgeon: PThayer Headings MD;  [facility];  Service: Cardiovascular;  Laterality: N/A;  . Back surgery    . Abdominal hysterectomy    . Cardiac catheterization  04/12/2003  [place] 60, LAD 90, D1 30, CFX 95->T, RCA OK  . Coronary artery bypass graft  04/18/2003    x 4; LIMA-LAD, SVG-D1, SVG-OM1-OM2  . Coronary angioplasty    . Cardioversion N/A 01/06/2015    Procedure: CARDIOVERSION;  Surgeon: Larey Dresser, MD;  Location: [facility];  Service: Cardiovascular;  Laterality: N/A;       History of present illness and  Hospital Course:     Kindly see H&P for history of present illness and admission details, please review complete Labs, Consult reports and Test reports for all details in brief  HPI  from the history and physical done on the day of admission 11/3 Cheryl Zamora is a very pleasant, very HOH 81 y.o. female past medical history includes hypertension, CAD  status post CABG 2012, recent diagnosis of PAF on Xarelto, chronic combined systolic Heart Failure, Chronic Kidney Disease Stage III Presents Emergency Department Chief Complaint Chest Palpitations. Initial Evaluation in the Emergency Department Reveals A. fib with RVR, Sepsis Related to Urinary Tract Infection and questionable mild diverticulitis. Patient reports her usual state of health until yesterday when she went back into rapid A. fib. Her nephew contacted her cardiologist and they were instructed to come to the emergency department patient resisted and refused to go the ED at that time but came today. She denies any chest pain shortness of breath headache dizziness syncope or near-syncope. She denies any worsening lower extremity edema. She denies abdominal pain nausea vomiting dysuria hematuria frequency or urgency. She denies any diarrhea constipation melena bright red blood per rectum.  Workup in the emergency department included lab work significant for creatinine of 1.84, WBCs 17.3 BNP 622 initial troponin negative. lactid acid 2.86. Urinalysis was + urinary tract infection. Chest x-ray without acute cardiopulmonary process. She was also noted to be in A. fib with RVR on EKG. She was placed on IV diltiazem up titration has been limited due to a soft blood pressure. CT of the abdomen and pelvis concerning for very mild diverticulitis without abscess or perforation.  While in the emergency department blood pressure blood pressure went from 035-46 systolically. Heart rate range was 140-160 respiration rate between 20 and 30 oxygen saturation level greater than 90% on room air. He was provided with 2 500 boluses of normal saline as well as cardizem some loading dose and drip initiated   Hospital Course   81 year old Caucasian female with a past medical history of hypertension, coronary artery disease, recent diagnosis of paroxysmal atrial fibrillation on anticoagulation, chronic combined systolic  and diastolic congestive heart failure, chronic kidney disease stage III, presented with palpitations. She was found to have an abnormal UA. CT suggested mild diverticulitis. She was found to have tachycardia and was noted to be in atrial fibrillation with RVR. She was hospitalized for further management. Patient underwent cardioversion on 11/7. Noted to have persistent hematuria. Urology was consulted.  Atrial fibrillation with RVR [name] status post cardioversion. Heart rate is in sinus rhythm now. Post cardioversion. She was in junctional rhythm for many hours. Blood pressure has improved. Continue metoprolol succinate. Discussed with Dr. Ellyn Hack. Continue anticoagulation with Xarelto. Chadsvasc at least 5 ( CHF 1, HTN 1, Age 71, Female 1, CAD 1).  Hematuria  In the setting of anticoagulation and renal stone. CT of the abdomen and pelvis that was done a few days ago was reviewed. She does have a renal cyst and a nonobstructing calculus. Seen by Dr. Louis Meckel from urology. Hematuria significantly improved, we'll  follow as an outpatient with urology regarding renal stone in a few weeks.  Sepsis secondary to UTI and perhaps mild diverticulitis Patient remains stable. Abdomen remains benign. I do not think she has diverticulitis based. Urine was abnormal. Blood cultures are negative so far. Unfortunately, urine culture was not sent. CBC shows improvement in WBC.patient was treated with total of 7 days of IV Rocephin which should be sufficient.  Chronic systolic and diastolic congestive heart failure.  EF earlier this year was about 45%. Continue oral Lasix. Weight had been stable.  Hyponatremia Improved. Could have been secondary to volume overload. Continue to monitor while on Lasix.  sodium is 133 on discharge   Acute on chronic renal failure stage III Creatinine is improved.  Continue Lasix. Marland Kitchen   History of essential hypertension Blood pressure has improved.     Discharge Condition:    Stable    Follow UP  Follow-up Information    Follow up with Ardis Hughs, MD In 3 weeks.   Specialty:  Urology   Why:  Will contact patient with follow-up appointment   Contact information:   [place] [address] [phone]       Follow up with Tarri Fuller, PA-C On 01/27/2015.   Specialties:  Physician Assistant, Radiology, Interventional Cardiology   Why:  Cardiology Hospital Follow-Up on 01/27/2015 at 10:00AM   Contact information:   [address] [place] [address] [phone]       Follow up with ARONSON,RICHARD A, MD.   Specialty:  Internal Medicine   Why:  After discharge from SNF   Contact information:   [place] [address] [phone]         Discharge Instructions  and  Discharge Medications     Discharge Instructions    Diet - low sodium heart healthy    Complete by:  As directed      Discharge instructions    Complete by:  As directed   Follow with Primary MD ARONSON,RICHARD A, MD in 7 days   Get CBC, CMP, checked  by Primary MD next visit.    Activity: As tolerated with Full fall precautions use walker/cane & assistance as needed   Disposition SNF   Diet: Heart Healthy  , with feeding assistance and aspiration precautions.  For Heart failure patients - Check your Weight same time everyday, if you gain over 2 pounds, or you develop in leg swelling, experience more shortness of breath or chest pain, call your Primary MD immediately. Follow Cardiac Low Salt Diet and 1.5 lit/day fluid restriction.   On your next visit with your primary care physician please Get Medicines reviewed and adjusted.   Please request your Prim.MD to go over all Hospital Tests and Procedure/Radiological results at the follow up, please get all Hospital records sent to your Prim MD by signing hospital release before you go home.   If you experience worsening of your admission symptoms, develop shortness of  breath, life threatening emergency, suicidal or homicidal thoughts you must seek medical attention immediately by calling 911 or calling your MD immediately  if symptoms less severe.  You Must read complete instructions/literature along with all the possible adverse reactions/side effects for all the Medicines you take and that have been prescribed to you. Take any new Medicines after you have completely understood and accpet all the possible adverse reactions/side effects.   Do not drive, operating heavy machinery, perform activities at heights, swimming or participation  in water activities or provide baby sitting services if your were admitted for syncope or siezures until you have seen by Primary MD or a Neurologist and advised to do so again.  Do not drive when taking Pain medications.    Do not take more than prescribed Pain, Sleep and Anxiety Medications  Special Instructions: If you have smoked or chewed Tobacco  in the last 2 yrs please stop smoking, stop any regular Alcohol  and or any Recreational drug use.  Wear Seat belts while driving.   Please note  You were cared for by a hospitalist during your hospital stay. If you have any questions about your discharge medications or the care you received while you were in the hospital after you are discharged, you can call the unit and asked to speak with the hospitalist on call if the hospitalist that took care of you is not available. Once you are discharged, your primary care physician will handle any further medical issues. Please note that NO REFILLS for any discharge medications will be authorized once you are discharged, as it is imperative that you return to your primary care physician (or establish a relationship with a primary care physician if you do not have one) for your aftercare needs so that they can reassess your need for medications and monitor your lab values.     Increase activity slowly    Complete by:  As directed              Medication List    TAKE these medications        furosemide 40 MG tablet  Commonly known as:  LASIX  Take 1 tablet (40 mg total) by mouth daily. NEED OV.     memantine 10 MG tablet  Commonly known as:  NAMENDA  Take 10 mg by mouth daily.     metoprolol succinate 25 MG 24 hr tablet  Commonly known as:  TOPROL-XL  TAKE 1 TABLET BY MOUTH DAILY     potassium chloride SA 20 MEQ tablet  Commonly known as:  K-DUR,KLOR-CON  TAKE 1 TABLET BY MOUTH DAILY     Rivaroxaban 15 MG Tabs tablet  Commonly known as:  XARELTO  Take 15 mg by mouth daily.          Diet and Activity recommendation: See Discharge Instructions above   Consults obtained -   cardiology  urology  Major procedures and Radiology Reports - PLEASE review detailed and final reports for all details, in brief -   Cardioversion on 11/7   Dg Chest Portable 1 View  01/02/2015  CLINICAL DATA:  Chest pain and tachycardia EXAM: PORTABLE CHEST 1 VIEW COMPARISON:  04/10/2014 FINDINGS: Cardiac enlargement with changes of CABG. Negative for heart failure. Lungs are clear without infiltrate or effusion. Negative for pneumonia. IMPRESSION: No active disease. Electronically Signed   By: Franchot Gallo M.D.   On: 01/02/2015 09:41   Ct Renal Stone Study  01/02/2015  CLINICAL DATA:  Abdominal pain, chronic kidney disease EXAM: CT ABDOMEN AND PELVIS WITHOUT CONTRAST TECHNIQUE: Multidetector CT imaging of the abdomen and pelvis was performed following the standard protocol without IV contrast. COMPARISON:  07/21/2004 FINDINGS: Sagittal images of the spine shows osteopenia and degenerative changes thoracolumbar spine. Lung bases are unremarkable. Unenhanced liver shows no biliary ductal dilatation. No calcified gallstones are noted within gallbladder. Atherosclerotic calcifications of abdominal aorta, SMA, splenic artery and bilateral common iliac arteries. No aortic aneurysm. Unenhanced kidneys are symmetrical in size. Bilateral  renal cortical thinning probable due to atrophy. There is a cyst in upper pole of the left kidney measures 2.4 cm. There is nonobstructive calculus in the left extrarenal pelvis measures 8.7 mm just above the left UPJ. No hydronephrosis or hydroureter. No calcified ureteral calculi. No small bowel obstruction. No ascites or free air. No adenopathy. Normal appendix is noted in axial image 59. No pericecal inflammation. Axial image 56 there is mild stranding of pericolonic fat in proximal sigmoid colon. Multiple sigmoid colon diverticula are noted. Mild diverticulitis cannot be excluded. There is no evidence of abscess or perforation. No extraluminal air. No free abdominal air. The uterus is atrophic. No adnexal mass. Moderate stool noted within rectum. The urinary bladder is under distended grossly unremarkable. Small hiatal hernia again noted. IMPRESSION: 1. There is mild stranding of pericolonic fat in proximal sigmoid colon axial image 56. Colonic diverticula are noted. Findings are suspicious for very mild diverticulitis. No abscess or perforation. 2. Normal appendix.  No pericecal inflammation. 3. There is a cyst in upper pole of the left kidney measures 2.4 cm. Nonobstructive calcified calculus in left extrarenal pelvis measures 8.7 mm. No hydronephrosis or hydroureter. 4. No calcified ureteral calculi. 5. No small bowel obstruction. Electronically Signed   By: Lahoma Crocker M.D.   On: 01/02/2015 12:25    Micro Results     Recent Results (from the past 240 hour(s))  Blood culture (routine x 2)     Status: None   Collection Time: 01/02/15 12:20 PM  Result Value Ref Range Status   Specimen Description BLOOD LEFT FOREARM  Final   Special Requests BOTTLES DRAWN AEROBIC ONLY 2ML  Final   Culture NO GROWTH 5 DAYS  Final   Report Status 01/07/2015 FINAL  Final  Blood culture (routine x 2)     Status: None   Collection Time: 01/02/15 12:38 PM  Result Value Ref Range Status   Specimen Description BLOOD  LEFT ANTECUBITAL  Final   Special Requests BOTTLES DRAWN AEROBIC AND ANAEROBIC 10ML  Final   Culture NO GROWTH 5 DAYS  Final   Report Status 01/07/2015 FINAL  Final  MRSA PCR Screening     Status: None   Collection Time: 01/02/15  6:51 PM  Result Value Ref Range Status   MRSA by PCR NEGATIVE NEGATIVE Final    Comment:        The GeneXpert MRSA Assay (FDA approved for NASAL specimens only), is one component of a comprehensive MRSA colonization surveillance program. It is not intended to diagnose MRSA infection nor to guide or monitor treatment for MRSA infections.        Today   Subjective:   Cheryl Zamora today has no headache,no chest abdominal pain,no new weakness tingling or numbness, feels much better  Today, hematuria significantly improved .  Objective:   Blood pressure 118/44, pulse 44, temperature 97.4 F (36.3 C), temperature source Oral, resp. rate 18, height '5\' 3"'$  (1.6 m), weight 74.39 kg (164 lb), SpO2 99 %.   Intake/Output Summary (Last 24 hours) at 01/08/15 1406 Last data filed at 01/08/15 1300  Gross per 24 hour  Intake    870 ml  Output   1450 ml  Net   -580 ml    Exam General appearance: alert, cooperative, appears stated age and no distress Resp: Improved air entry. Very few crackles at the bases, which diminish with deep breathing. No rhonchi.  Cardio: S1, S2 is irregularly irregular. No S3, S4. No rubs, murmurs, or  bruit. No pedal edema. GI: soft, non-tender; bowel sounds normal; no masses, no organomegaly Neurologic: Alert and oriented 3. No focal neurological deficits are noted.  Data Review   CBC w Diff: Lab Results  Component Value Date   WBC 11.7* 01/08/2015   HGB 10.6* 01/08/2015   HCT 34.6* 01/08/2015   PLT 273 01/08/2015   LYMPHOPCT 18 01/06/2015   MONOPCT 9 01/06/2015   EOSPCT 1 01/06/2015   BASOPCT 0 01/06/2015    CMP: Lab Results  Component Value Date   NA 133* 01/08/2015   K 3.9 01/08/2015   CL 100* 01/08/2015    CO2 24 01/08/2015   BUN 17 01/08/2015   CREATININE 1.19* 01/08/2015   PROT 6.5 01/02/2015   ALBUMIN 3.7 01/02/2015   BILITOT 1.9* 01/02/2015   ALKPHOS 65 01/02/2015   AST 35 01/02/2015   ALT 18 01/02/2015  .   Total Time in preparing paper work, data evaluation and todays exam - 35 minutes  ELGERGAWY, DAWOOD M.D on 01/08/2015 at 2:06 PM  [facility]   Office  [phone]

## 2015-01-08 NOTE — Evaluation (Signed)
Occupational Therapy Evaluation Patient Details Name: Cheryl Zamora MRN: [id number] DOB: 1923-10-22 Today's Date: 01/08/2015    History of Present Illness [name] 81 y/o female presents with palpitations. In ED, pt was found to have an abnormal UA, found to have tachycardia and was noted to be in A-fib with RVR s/p cardioversion 11/7. Also noted to have Hematuria. PMH includes CAD, PAF on warfarin, CHF, HTN, HLD.   Clinical Impression   Pt admitted with above, and demonstrates the below listed deficits.  She currently requires minA for ADLs, and demonstrates deficits with memory, motor planning, organization and sequencing.   She lived alone with nephew assisting intermittently.  She currently is unsafe to return home alone.  Recommend SNF with likely transition to ALF. All further OT needs can be addressed at SNF.     Follow Up Recommendations  SNF    Equipment Recommendations  None recommended by OT    Recommendations for Other Services       Precautions / Restrictions Precautions Precautions: Fall Restrictions Weight Bearing Restrictions: No      Mobility Bed Mobility Overal bed mobility: Needs Assistance Bed Mobility: Supine to Sit;Sit to Supine     Supine to sit: Min assist Sit to supine: Min assist   General bed mobility comments: assist to scoot toward EOB and assist to lift LEs onto bed   Transfers Overall transfer level: Needs assistance Equipment used: Rolling walker (2 wheeled) Transfers: Sit to/from [facility] Sit to Stand: Min assist Stand pivot transfers: Min assist       General transfer comment: min A to move into standing and minA for balance and walker management     Balance Overall balance assessment: Needs assistance Sitting-balance support: Feet supported Sitting balance-Leahy Scale: Fair     Standing balance support: Bilateral upper extremity supported Standing balance-Leahy Scale: Poor Standing balance comment:  fair to poor occasionally requires min A                            ADL Overall ADL's : Needs assistance/impaired Eating/Feeding: Independent;Sitting   Grooming: Wash/dry hands;Wash/dry face;Oral care;Minimal assistance;Standing   Upper Body Bathing: Supervision/ safety;Sitting   Lower Body Bathing: Moderate assistance;Sit to/from stand   Upper Body Dressing : Minimal assistance;Sitting   Lower Body Dressing: Moderate assistance;Sit to/from stand   Toilet Transfer: Minimal assistance;Ambulation;Comfort height toilet;Grab bars;RW [employer] forces technical officer Details (indicate cue type and reason): step by step cues for sequencing and walker safety  Toileting- Clothing Manipulation and Hygiene: Minimal assistance;Sit to/from stand       Functional mobility during ADLs: Minimal assistance;Rolling walker       Vision     Perception     Praxis Praxis Praxis tested?: Deficits Deficits: Initiation    Pertinent Vitals/Pain Pain Assessment: No/denies pain     Hand Dominance     Extremity/Trunk Assessment Upper Extremity Assessment Upper Extremity Assessment: Generalized weakness   Lower Extremity Assessment Lower Extremity Assessment: Defer to PT evaluation   Cervical / Trunk Assessment Cervical / Trunk Assessment: Kyphotic   Communication Communication Communication: HOH   Cognition Arousal/Alertness: Awake/alert Behavior During Therapy: WFL for tasks assessed/performed Overall Cognitive Status: No family/caregiver present to determine baseline cognitive functioning Area of Impairment: Memory;Problem solving;Safety/judgement     Memory: Decreased short-term memory   Safety/Judgement: Decreased awareness of safety;Decreased awareness of deficits   Problem Solving: Slow processing;Decreased initiation;Difficulty sequencing;Requires verbal cues;Requires tactile cues General Comments: Pt with poor problem  solving.  She requires cues for sequencing and motor  plannning tasks.  Becomes easily confused about how to move sit to stand and how she is going to walk to bathroom.  Forgot to turn on faucet to wash hands    General Comments       Exercises       Shoulder Instructions      Home Living Family/patient expects to be discharged to:: Skilled nursing facility Living Arrangements: Alone Available Help at Discharge: Family;Available PRN/intermittently Type of Home: House Home Access: Level entry                     Home Equipment: Walker - 2 wheels   Additional Comments: Pt reports her nephew provides transportation and assists with meals       Prior Functioning/Environment Level of Independence: Independent with assistive device(s)        Comments: Uses RW for ambulation PRN. Drives.     OT Diagnosis: Generalized weakness;Cognitive deficits   OT Problem List: Decreased strength;Decreased activity tolerance;Impaired balance (sitting and/or standing);Decreased cognition;Decreased safety awareness;Decreased knowledge of use of DME or AE;Obesity   OT Treatment/Interventions: Self-care/ADL training;Therapeutic exercise;DME and/or AE instruction;Therapeutic activities;Patient/family education;Balance training    OT Goals(Current goals can be found in the care plan section) Acute Rehab OT Goals Patient Stated Goal: none stated  OT Frequency:     Barriers to D/C: Decreased caregiver support          Co-evaluation              End of Session Equipment Utilized During Treatment: Rolling walker Nurse Communication: Mobility status  Activity Tolerance: Patient tolerated treatment well Patient left: in bed;with call bell/phone within reach;with bed alarm set   Time: 9311-2162 OT Time Calculation (min): 26 min Charges:  OT General Charges $OT Visit: 1 Procedure OT Evaluation $Initial OT Evaluation Tier I: 1 Procedure OT Treatments $Self Care/Home Management : 8-22 mins G-Codes:    Alfonso Carden M 2015-01-17,  12:02 PM

## 2015-01-08 NOTE — NC FL2 (Signed)
[place] LEVEL OF CARE SCREENING TOOL     IDENTIFICATION  Patient Name: Cheryl Zamora Birthdate: 17-Mar-1923 Sex: female Admission Date (Current Location): 01/02/2015  [facility] and [place] Number: Herbalist and Address:  The [place]. [facility], [place] [address], [place], [place] [address]      Provider Number: [id number]  Attending Physician Name and Address:  Albertine Patricia, MD  Relative Name and Phone Number:  [name] [phone]    Current Level of Care: Hospital Recommended Level of Care: [place] Prior Approval Number:    Date Approved/Denied:   PASRR Number: [id number]   Discharge Plan: SNF    Current Diagnoses: Patient Active Problem List   Diagnosis Date Noted  . Hematuria   . Acute on chronic renal insufficiency ([place]) 01/06/2015  . Atrial fibrillation with RVR ([place])   . Sepsis ([place]) 01/02/2015  . UTI (lower urinary tract infection) 01/02/2015  . Paroxysmal atrial fibrillation (HCC)   . Chronic diastolic (congestive) heart failure ([place])   . Atrial fibrillation with rapid ventricular response ([place])   . Acute on chronic combined systolic and diastolic congestive heart failure ([place]) 04/12/2014  . CKD (chronic kidney disease) stage 3, GFR 30-59 ml/min 04/11/2014  . CAD, multiple vessel, hx CABG 2012 04/11/2014  . Long term current use of anticoagulant therapy 12/21/2012  . Physical deconditioning 12/17/2012  . PAF (paroxysmal atrial fibrillation) ([place]) 12/12/2012  . Chronic diastolic CHF (congestive heart failure), NYHA class 3 ([place]) 12/12/2012  . Essential hypertension   . Neuropathy ([place])     Orientation ACTIVITIES/SOCIAL BLADDER RESPIRATION    Self, Time, Situation, Place    Continent Normal  BEHAVIORAL SYMPTOMS/MOOD NEUROLOGICAL BOWEL NUTRITION STATUS      Continent  ([place] Modified)  PHYSICIAN VISITS COMMUNICATION OF NEEDS Height & Weight Skin    Verbally '5\' 1"'$  (154.9 cm) 164 lbs. Normal           AMBULATORY STATUS RESPIRATION    Supervision limited Normal      Personal Care Assistance Level of Assistance  Dressing, Bathing Bathing Assistance: Limited assistance   Dressing Assistance: Limited assistance      Functional Limitations Info                SPECIAL CARE FACTORS FREQUENCY  PT (By licensed PT)     PT Frequency: 5             Additional Factors Info  Code Status Code Status Info: Full              Current Medications (01/08/2015): Current Facility-Administered Medications  Medication Dose Route Frequency Provider Last Rate Last Dose  . acetaminophen (TYLENOL) tablet 650 mg  650 mg Oral Q4H PRN Radene Gunning, NP   650 mg at 01/03/15 1940  . cefTRIAXone (ROCEPHIN) 1 g in dextrose 5 % 50 mL IVPB  1 g Intravenous Q24H Rachel L Rumbarger, RPH   1 g at 01/07/15 1100  . docusate sodium (COLACE) capsule 100 mg  100 mg Oral BID Bonnielee Haff, MD   100 mg at 01/08/15 0941  . furosemide (LASIX) injection 40 mg  40 mg Intravenous Once Albertine Patricia, MD      . furosemide (LASIX) tablet 40 mg  40 mg Oral Daily Albertine Patricia, MD      . HYDROcodone-acetaminophen (NORCO/VICODIN) 5-325 MG per tablet 1 tablet  1 tablet Oral Q4H PRN Jeryl [place], NP      .  hydrocortisone cream 1 % 1 application  1 application Topical TID PRN Jacolyn Reedy, MD      . metoprolol tartrate (LOPRESSOR) tablet 12.5 mg  12.5 mg Oral QHS Albertine Patricia, MD      . Derrill Memo ON 01/09/2015] metoprolol tartrate (LOPRESSOR) tablet 25 mg  25 mg Oral Daily Albertine Patricia, MD      . ondansetron (ZOFRAN) injection 4 mg  4 mg Intravenous Q6H PRN Rhetta Mura Schorr, NP   4 mg at 01/08/15 0600  . Rivaroxaban (XARELTO) tablet 15 mg  15 mg Oral Q supper Radene Gunning, NP   15 mg at 01/07/15 1743  . sodium chloride 0.9 % injection 3 mL  3 mL Intravenous Q12H Almyra Deforest, PA   3 mL at 01/08/15 1000  . sodium chloride 0.9 % injection 3 mL  3 mL Intravenous PRN Almyra Deforest, PA       Do  not use this list as official medication orders. Please verify with discharge summary.  Discharge Medications:   Medication List    ASK your doctor about these medications        furosemide 40 MG tablet  Commonly known as:  LASIX  Take 1 tablet (40 mg total) by mouth daily. NEED OV.     memantine 10 MG tablet  Commonly known as:  NAMENDA  Take 10 mg by mouth daily.     metoprolol succinate 25 MG 24 hr tablet  Commonly known as:  TOPROL-XL  TAKE 1 TABLET BY MOUTH DAILY     potassium chloride SA 20 MEQ tablet  Commonly known as:  K-DUR,KLOR-CON  TAKE 1 TABLET BY MOUTH DAILY     Rivaroxaban 15 MG Tabs tablet  Commonly known as:  XARELTO  Take 15 mg by mouth daily.        Relevant Imaging Results:  Relevant Lab Results:  Recent Labs    Additional Information    Seanmichael Salmons, Jones Broom, LCSWA

## 2015-01-08 NOTE — Clinical Social Work Note (Signed)
Clinical Social Work Assessment  Patient Details  Name: Cheryl Zamora MRN: [id number] Date of Birth: 03-31-1923  Date of referral:  01/08/15               Reason for consult:  Facility Placement                Permission sought to share information with:  Family Supports, Chartered certified accountant granted to share information::  Yes, Verbal Permission Granted  Name::     Patient's nephew Insurance underwriter::  SNF admissions  Relationship::     Contact Information:     Housing/Transportation Living arrangements for the past 2 months:  [place] of Information:  Other (Comment Required) (Patient's nephew who is Media planner) Patient Interpreter Needed:  None Criminal Activity/Legal Involvement Pertinent to Current Situation/Hospitalization:  No - Comment as needed Significant Relationships:  Other Family Members Lives with:  Self Do you feel safe going back to the place where you live?  Yes Need for family participation in patient care:  Yes (Comment)  Care giving concerns:  Patient and nephew feel patient needs some short term rehab in order to return back home.   Social Worker assessment / plan:  Patient was resting and did not feel like talking with CSW, assessment was completed with patient's nephew.  [name] a 81 year old female who lives alone.  [name] alert and oriented x4, however she requests that nephew help make decisions for patient.  Patient has never been to SNF for short term rehab, but has had home health for rehab.  Patient's nephew was explained process of SNF placement and how insurance pays for coverage, patient's family did not have any other questions.  Employment status:  Retired Forensic scientist:    PT Recommendations:  [place] / Referral to community resources:     Patient/Family's Response to care:  Patient and family are agreeable to going to SNF for short term rehab.  Patient/Family's  Understanding of and Emotional Response to Diagnosis, Current Treatment, and Prognosis:  Patient's nephew is aware of current treatment plan and prognosis.  Emotional Assessment Appearance:  Appears stated age Attitude/Demeanor/Rapport:    Affect (typically observed):  Appropriate, Restless Orientation:  Oriented to Self, Oriented to Place, Oriented to  Time, Oriented to Situation Alcohol / Substance use:  Not Applicable Psych involvement (Current and /or in the community):     Discharge Needs  Concerns to be addressed:  Lack of Support Readmission within the last 30 days:  No Current discharge risk:  Lives alone Barriers to Discharge:  Insurance Authorization   Anell Barr 01/08/2015, 2:15 PM

## 2015-01-08 NOTE — Progress Notes (Addendum)
Patient Name: Cheryl Zamora Date of Encounter: 01/08/2015  Principal Problem:   Sepsis ([place]) Active Problems:   PAF (paroxysmal atrial fibrillation) (HCC)   Chronic diastolic CHF (congestive heart failure), NYHA class 3 (HCC)   Essential hypertension   CKD (chronic kidney disease) stage 3, GFR 30-59 ml/min   CAD, multiple vessel, hx CABG 2012   UTI (lower urinary tract infection)   Atrial fibrillation with rapid ventricular response (HCC)   Acute on chronic renal insufficiency (HCC)   Atrial fibrillation with RVR ([place])   Hematuria    Primary Cardiologist: Dr. [place] Patient Profile: 81 yo female w/PMH of HTN, HLD, CAD (s/p CABG 2012), PAF (on Xarelto), chronic combined systolic and diastolic HF, and CKD stage 3 admitted to [facility] on 113/2016 with Atrial Fibrillation w/ RVR and sepsis secondary to UTI.  SUBJECTIVE: Denies any chest pain, palpitations, or shortness of breath. Reports feeling very tired this morning, saying she can barely keep her eyes open.   OBJECTIVE Filed Vitals:   01/07/15 1302 01/07/15 2138 01/08/15 0500 01/08/15 0513  BP: 117/56 106/51  110/49  Pulse: 52 52  51  Temp: 98 F (36.7 C) 98 F (36.7 C)  97.5 F (36.4 C)  TempSrc: Oral Oral  Oral  Resp: '19 18  18  '$ Height:      Weight:   164 lb (74.39 kg)   SpO2: 100% 100%  100%    Intake/Output Summary (Last 24 hours) at 01/08/15 0920 Last data filed at 01/07/15 2219  Gross per 24 hour  Intake    560 ml  Output   1700 ml  Net  -1140 ml   Filed Weights   01/06/15 0500 01/07/15 0500 01/08/15 0500  Weight: 158 lb 8.2 oz (71.9 kg) 172 lb 9.9 oz (78.3 kg) 164 lb (74.39 kg)    PHYSICAL EXAM General: Well developed, well nourished, female in no acute distress. Head: Normocephalic, atraumatic.  Neck: Supple without bruits, JVD not elevated. Lungs:  Resp regular and unlabored, CTA without wheezing or rales. Heart: RRR, S1, S2, no S3, S4, or murmur; no rub. Abdomen: Soft, non-tender,  non-distended with normoactive bowel sounds. No hepatomegaly. No rebound/guarding. No obvious abdominal masses. Extremities: No clubbing or cyanosis. 1+ edema bilaterally. Distal pedal pulses are 2+ bilaterally. Neuro: Alert and oriented X 3. Moves all extremities spontaneously. Psych: Normal affect.  LABS: CBC: Recent Labs  01/06/15 1957 01/07/15 0331 01/08/15 0250  WBC 11.5* 10.7* 11.7*  NEUTROABS 8.2*  --   --   HGB 10.5* 10.3* 10.6*  HCT 34.4* 33.8* 34.6*  MCV 80.0 79.2 79.0  PLT 268 278 287   Basic Metabolic Panel: Recent Labs  01/07/15 0331 01/08/15 0250  NA 133* 133*  K 4.4 3.9  CL 101 100*  CO2 25 24  GLUCOSE 116* 132*  BUN 20 17  CREATININE 1.18* 1.19*  CALCIUM 9.1 9.0  MG 2.0  --    BNP:  B NATRIURETIC PEPTIDE  Date/Time Value Ref Range Status  01/02/2015 09:23 AM 622.9* 0.0 - 100.0 pg/mL Final  04/10/2014 01:37 PM 413.8* 0.0 - 100.0 pg/mL Final    TELE:  Questionable Atrial fibrillation at times? Rate in 40's - 60's.    ECG: Pending   Current Medications:  . cefTRIAXone (ROCEPHIN)  IV  1 g Intravenous Q24H  . docusate sodium  100 mg Oral BID  . metoprolol tartrate  25 mg Oral BID  . Rivaroxaban  15 mg Oral Q supper  .  sodium chloride  3 mL Intravenous Q12H   . diltiazem (CARDIZEM) infusion Stopped (01/06/15 1335)    ASSESSMENT AND PLAN:  1. Atrial fibrillation with RVR.  - Chart review indicates she was diagnosed with A. fib first time in October of this year.  - underwent cardioversion on 01/06/2015. Having questionable junction rhythm vs. atrial fibrillation overnight and this morning with rate in low-40's to low-60's. Will obtain new EKG. - This patients CHA2DS2-VASc Score and unadjusted Ischemic Stroke Rate (% per year) is equal to 9.7 % stroke rate/year from a score of 5. (CHF, HTN, Vascular, Female, Age > 75 x 2). Continue Xarelto for anticoagulation. - consider decreasing Metoprolol from '25mg'$  BID to 12.'5mg'$  BID due to bradycardia.   2.  Sepsis related to urinary tract infection and perhaps mild UTI.  - On Rocephin - per admitting team   3. Chronic diastolic heart failure.  - Echo in February of this year with an EF of 45%, mild LVH, mild MR.  - has lower extremity edema on examination. Will restart home Lasix '40mg'$  daily. - Can probably give 1 dose IV here as she has been off it for couple days. Restart home dose on discharge  4. Acute on chronic renal insufficiency stage III.  - Creatinine improved to 1.19 on 11//2016  5. CAD - no anginal symptoms despite being in A. fib RVR with sepsis. - multiple vessel, hx CABG 2012  6. Essential hypertension  - BP has been 106/49 - 117/56 in the past 24 hours. - antihypertensives have been on hold.   7. Calculus in extrarenal pelvis - thought to be most likely cause of hematuria.  - urology said this can be managed as outpatient.   Signed, Erma Heritage , PA-C 9:20 AM 01/08/2015 Pager: [phone]  I have seen, examined and evaluated the patient this AM along with Mrs. Ahmed Prima, [place].  After reviewing all the available data and chart,  I agree with her findings, examination as well as impression recommendations.  The patient looks and feels much better today. She does have some edema so I agree restarting her Lasix. May be giving her dose of IV Lasix here would be reasonable as her edema is worse and she's had doses held while inpatient.  Although telemetry did look like she may have been atrial fibrillation, she had some sinus arrhythmia but is in sinus rhythm. She had some bradycardia overnight, so I agree with reducing her evening dose of metoprolol to 12.5 mg while maintaining the daytime dose to 25 mg. She was actually on metoprolol succinate at home, would potentially discontinue metoprolol tartrate and use metoprolol succinate 25 mg.  It would appear the plan from the [facility] M.D. (Dr [facility]) that the plan is for potential discharge to skilled  nursing facility today. She is stable overall from a cardiac standpoint as she is maintaining sinus rhythm.   Leonie Man, M.D., M.S. Interventional Cardiologist   Pager # [phone]

## 2015-01-08 NOTE — Care Management Important Message (Signed)
Important Message  Patient Details  Name: Cheryl Zamora MRN: [id number] Date of Birth: 09/18/1923   Medicare Important Message Given:  Yes-third notification given    Maryclare Labrador, RN 01/08/2015, 2:12 PM

## 2015-01-08 NOTE — Clinical Social Work Placement (Signed)
   CLINICAL SOCIAL WORK PLACEMENT  NOTE  Date:  01/08/2015  Patient Details  Name: Cheryl Zamora MRN: [id number] Date of Birth: 07-Dec-1923  Clinical Social Work is seeking post-discharge placement for this patient at the [place] level of care (*CSW will initial, date and re-position this form in  chart as items are completed):  Yes   Patient/family provided with [place] Work Department's list of facilities offering this level of care within the geographic area requested by the patient (or if unable, by the patient's family).  Yes   Patient/family informed of their freedom to choose among providers that offer the needed level of care, that participate in Medicare, Medicaid or managed care program needed by the patient, have an available bed and are willing to accept the patient.  Yes   Patient/family informed of [facility]'s ownership interest in [facility] and [facility], as well as of the fact that they are under no obligation to receive care at these facilities.  PASRR submitted to EDS on 01/08/15     PASRR number received on       Existing PASRR number confirmed on 01/08/15     FL2 transmitted to all facilities in geographic area requested by pt/family on 01/08/15     FL2 transmitted to all facilities within larger geographic area on       Patient informed that his/her managed care company has contracts with or will negotiate with certain facilities, including the following:        Yes   Patient/family informed of bed offers received.  Patient chooses bed at [facility]     Physician recommends and patient chooses bed at      Patient to be transferred to [facility] on 01/08/15.  Patient to be transferred to facility by Patient's nephew's personal car     Patient family notified on 01/08/15 of transfer.  Name of family member notified:  [name] patient's nephew     PHYSICIAN     Additional Comment:     _______________________________________________ Ross Ludwig, LCSWA 01/08/2015, 2:25 PM

## 2015-01-08 NOTE — Progress Notes (Signed)
Discharged with instructions. Pt voiced understanding.

## 2015-01-08 NOTE — Progress Notes (Signed)
Discharge Note:  Patient alert and oriented and in no distress.  Patient's nephew and POA, [name], at bedside.  After visit summary sent to [facility] by CSW.  Copy of after visit summary sent offered to patient's nephew who stated that he would access it on "My Chart".  Patient confirmed that she had all of her personal belongings.  Peripheral IV removed.  Telemetry discontinued. Patient transported by this RN via wheelchair to her nephew's car.  Report called to [place] at [facility].

## 2015-01-08 NOTE — Clinical Social Work Note (Signed)
Patient to be d/c'ed today to [facility].  Patient and family agreeable to plans will transport via nephew's personal vehicle RN to call report.  Evette Cristal, MSW, [facility]

## 2015-01-09 ENCOUNTER — Non-Acute Institutional Stay (SKILLED_NURSING_FACILITY): Payer: Medicare Other | Admitting: Internal Medicine

## 2015-01-09 DIAGNOSIS — R0789 Other chest pain: Secondary | ICD-10-CM

## 2015-01-09 DIAGNOSIS — R0781 Pleurodynia: Secondary | ICD-10-CM

## 2015-01-09 DIAGNOSIS — N200 Calculus of kidney: Secondary | ICD-10-CM

## 2015-01-09 DIAGNOSIS — D638 Anemia in other chronic diseases classified elsewhere: Secondary | ICD-10-CM

## 2015-01-09 DIAGNOSIS — I5032 Chronic diastolic (congestive) heart failure: Secondary | ICD-10-CM

## 2015-01-09 DIAGNOSIS — N3000 Acute cystitis without hematuria: Secondary | ICD-10-CM

## 2015-01-09 DIAGNOSIS — D72829 Elevated white blood cell count, unspecified: Secondary | ICD-10-CM | POA: Diagnosis not present

## 2015-01-09 DIAGNOSIS — R531 Weakness: Secondary | ICD-10-CM

## 2015-01-09 DIAGNOSIS — N183 Chronic kidney disease, stage 3 unspecified: Secondary | ICD-10-CM

## 2015-01-09 DIAGNOSIS — K5900 Constipation, unspecified: Secondary | ICD-10-CM

## 2015-01-09 NOTE — Progress Notes (Signed)
Patient ID: Cheryl Zamora, female   DOB: [birth year], 81 y.o.   MRN: [id number]     Facility: [facility]    PCP: Geoffery Lyons, MD  Code Status: full code  Allergies  Allergen Reactions  . Amiodarone     Excessive QT prolongation, nausea  . Codeine Nausea And Vomiting  . Penicillins Nausea Only    Chief Complaint  Patient presents with  . New Admit To SNF     HPI:  81 y.o. patient is here for short term rehabilitation post hospital admission from 01/02/15-01/08/15 with palpitations. She was noted to have afib with RVR and sepsis in setting of UTI and questionable mild diverticulitis. She required cardizem drip and then became hypotensive and required iv fluids. She required cardioversion. She was treated with iv rocephin and was alos noted to have nonobstructing renal calculus. She has PMH of CAD, HTN, Afib, CHF, CKD stage 3 among others. She is seen in her room today. She complaints of right lower rib cage pain at times bothering her. No other concerns.  Review of Systems:  Constitutional: Negative for fever, chills, diaphoresis.  HENT: Negative for headache, congestion, nasal discharge.   Eyes: Negative for eye pain, blurred vision, double vision and discharge.  Respiratory: Negative for cough, shortness of breath and wheezing.   Cardiovascular: Negative for chest pain, palpitations, leg swelling.  Gastrointestinal: Negative for heartburn, nausea, vomiting, abdominal pain. Has been constipated.  Genitourinary: Negative for dysuria, flank pain.  Musculoskeletal: Negative for back pain, falls. Skin: Negative for itching, rash.  Neurological: Negative for dizziness, tingling, focal weakness Psychiatric/Behavioral: Negative for depression.   Past Medical History  Diagnosis Date  . Hyperlipidemia     pt denies this hx on 01/03/2015  . Hypertension   . Coronary artery disease 2005    a. s/p CABG (2012)  . Neuropathy ([place])   . Atrial fibrillation  with rapid ventricular response ([place]) 11/2012; 10/2013    a. s/p TEE/DCCV 11/2012 & 10/2013 b. amiodarone caused significant QT prlongation (>641mec)  . Chronic combined systolic and diastolic CHF (congestive heart failure) [place]   . Osteoarthritis of back   . GERD (gastroesophageal reflux disease)    Past Surgical History  Procedure Laterality Date  . Tonsillectomy    . Cervical disc surgery  1960's    "ruptured disc'  . Tee without cardioversion N/A 12/13/2012    Procedure: TRANSESOPHAGEAL ECHOCARDIOGRAM (TEE);  Surgeon: DLarey Dresser MD;  [place]  Service: Cardiovascular;  Laterality: N/A;  . Cardioversion N/A 12/13/2012    Procedure: CARDIOVERSION;  Surgeon: DLarey Dresser MD;  [place]  Service: Cardiovascular;  Laterality: N/A;  . Tee without cardioversion N/A 11/13/2013    Procedure: TRANSESOPHAGEAL ECHOCARDIOGRAM (TEE);  Surgeon: KDorothy Spark MD;  [place]  Service: Cardiovascular;  Laterality: N/A;  . Cardioversion N/A 11/13/2013    Procedure: CARDIOVERSION;  Surgeon: KDorothy Spark MD;  [place]  Service: Cardiovascular;  Laterality: N/A;  . Cardioversion N/A 04/12/2014    Procedure: CARDIOVERSION;  Surgeon: PThayer Headings MD;  [facility]  Service: Cardiovascular;  Laterality: N/A;  . Back surgery    . Abdominal hysterectomy    . Cardiac catheterization  04/12/2003    LMain 60, LAD 90, D1 30, CFX 95->T, RCA OK  . Coronary artery bypass graft  04/18/2003    x 4; LIMA-LAD, SVG-D1, SVG-OM1-OM2  . Coronary angioplasty    . Cardioversion N/A 01/06/2015  Procedure: CARDIOVERSION;  Surgeon: Larey Dresser, MD;  Location: [facility];  Service: Cardiovascular;  Laterality: N/A;   Social History:   reports that she has quit smoking. Her smoking use included Cigarettes. She has a 60 pack-year smoking history. She has never used smokeless tobacco. She reports that she drinks alcohol. She reports  that she does not use illicit drugs.  Family History  Problem Relation Age of Onset  . Heart attack Mother   . Heart attack Father   . Heart attack Brother   . Heart attack Brother   . Heart attack Brother   . Heart attack Brother     Medications:   Medication List       This list is accurate as of: 01/09/15 10:29 AM.  Always use your most recent med list.               furosemide 40 MG tablet  Commonly known as:  LASIX  Take 1 tablet (40 mg total) by mouth daily. NEED OV.     memantine 10 MG tablet  Commonly known as:  NAMENDA  Take 10 mg by mouth daily.     metoprolol succinate 25 MG 24 hr tablet  Commonly known as:  TOPROL-XL  TAKE 1 TABLET BY MOUTH DAILY     potassium chloride SA 20 MEQ tablet  Commonly known as:  K-DUR,KLOR-CON  TAKE 1 TABLET BY MOUTH DAILY     Rivaroxaban 15 MG Tabs tablet  Commonly known as:  XARELTO  Take 15 mg by mouth daily.         Physical Exam: Filed Vitals:   01/09/15 1027  BP: 122/89  Pulse: 94  Temp: 97.4 F (36.3 C)  Resp: 18  SpO2: 97%    General- elderly female, well built, in no acute distress Head- normocephalic, atraumatic Nose- normal nasal mucosa, no maxillary or frontal sinus tenderness, no nasal discharge Throat- moist mucus membrane  Eyes- PERRLA, EOMI, no pallor, no icterus, no discharge, normal conjunctiva, normal sclera Neck- no cervical lymphadenopathy Cardiovascular- normal s1,s2, no murmurs, 1+ leg edema Respiratory- bilateral clear to auscultation, no wheeze, no rhonchi, no crackles, no use of accessory muscles Abdomen- bowel sounds present, soft, non tender Musculoskeletal- able to move all 4 extremities, generalized weakness  Neurological- no focal deficit, alert and oriented to person, place and time Skin- warm and dry, lower leg mild erythema + Psychiatry- normal mood and affect    Labs reviewed: Basic Metabolic Panel:  Recent Labs  04/10/14 2025  01/06/15 0306 01/07/15 0331  01/08/15 0250  NA  --   < > 134* 133* 133*  K  --   < > 3.3* 4.4 3.9  CL  --   < > 99* 101 100*  CO2  --   < > '24 25 24  '$ GLUCOSE  --   < > 148* 116* 132*  BUN  --   < > 24* 20 17  CREATININE  --   < > 1.33* 1.18* 1.19*  CALCIUM  --   < > 8.6* 9.1 9.0  MG 2.0  --   --  2.0  --   < > = values in this interval not displayed. Liver Function Tests:  Recent Labs  01/02/15 0914  AST 35  ALT 18  ALKPHOS 65  BILITOT 1.9*  PROT 6.5  ALBUMIN 3.7    Recent Labs  01/02/15 0914  LIPASE 43   No results for input(s): AMMONIA in the last 8760 hours.  CBC:  Recent Labs  04/10/14 1337  01/02/15 0914  01/06/15 1957 01/07/15 0331 01/08/15 0250  WBC 11.0*  < > 17.3*  < > 11.5* 10.7* 11.7*  NEUTROABS 7.5  --  13.5*  --  8.2*  --   --   HGB 12.9  < > 12.2  < > 10.5* 10.3* 10.6*  HCT 40.6  < > 38.6  < > 34.4* 33.8* 34.6*  MCV 86.2  < > 79.1  < > 80.0 79.2 79.0  PLT 312  < > 309  < > 268 278 273  < > = values in this interval not displayed. Cardiac Enzymes:  Recent Labs  04/10/14 2025 04/11/14 0254 01/02/15 0923  TROPONINI <0.03 <0.03 <0.03   BNP: Invalid input(s): POCBNP CBG: No results for input(s): GLUCAP in the last 8760 hours.  Radiological Exams: Dg Chest Portable 1 View  01/02/2015  CLINICAL DATA:  Chest pain and tachycardia EXAM: PORTABLE CHEST 1 VIEW COMPARISON:  04/10/2014 FINDINGS: Cardiac enlargement with changes of CABG. Negative for heart failure. Lungs are clear without infiltrate or effusion. Negative for pneumonia. IMPRESSION: No active disease. Electronically Signed   By: Franchot Gallo M.D.   On: 01/02/2015 09:41   Ct Renal Stone Study  01/02/2015  CLINICAL DATA:  Abdominal pain, chronic kidney disease EXAM: CT ABDOMEN AND PELVIS WITHOUT CONTRAST TECHNIQUE: Multidetector CT imaging of the abdomen and pelvis was performed following the standard protocol without IV contrast. COMPARISON:  07/21/2004 FINDINGS: Sagittal images of the spine shows osteopenia and  degenerative changes thoracolumbar spine. Lung bases are unremarkable. Unenhanced liver shows no biliary ductal dilatation. No calcified gallstones are noted within gallbladder. Atherosclerotic calcifications of abdominal aorta, SMA, splenic artery and bilateral common iliac arteries. No aortic aneurysm. Unenhanced kidneys are symmetrical in size. Bilateral renal cortical thinning probable due to atrophy. There is a cyst in upper pole of the left kidney measures 2.4 cm. There is nonobstructive calculus in the left extrarenal pelvis measures 8.7 mm just above the left UPJ. No hydronephrosis or hydroureter. No calcified ureteral calculi. No small bowel obstruction. No ascites or free air. No adenopathy. Normal appendix is noted in axial image 59. No pericecal inflammation. Axial image 56 there is mild stranding of pericolonic fat in proximal sigmoid colon. Multiple sigmoid colon diverticula are noted. Mild diverticulitis cannot be excluded. There is no evidence of abscess or perforation. No extraluminal air. No free abdominal air. The uterus is atrophic. No adnexal mass. Moderate stool noted within rectum. The urinary bladder is under distended grossly unremarkable. Small hiatal hernia again noted. IMPRESSION: 1. There is mild stranding of pericolonic fat in proximal sigmoid colon axial image 56. Colonic diverticula are noted. Findings are suspicious for very mild diverticulitis. No abscess or perforation. 2. Normal appendix.  No pericecal inflammation. 3. There is a cyst in upper pole of the left kidney measures 2.4 cm. Nonobstructive calcified calculus in left extrarenal pelvis measures 8.7 mm. No hydronephrosis or hydroureter. 4. No calcified ureteral calculi. 5. No small bowel obstruction. Electronically Signed   By: Lahoma Crocker M.D.   On: 01/02/2015 12:25     Assessment/Plan  Generalized weakness With her recent afib with RVR and sepsis from UTI. Will have her work with physical therapy and occupational  therapy team to help with gait training and muscle strengthening exercises.fall precautions. Skin care. Encourage to be out of bed.   UTI Has completed course of rocephin, currently asymptomatic. Monitor clinically, hydration encourage  afib Rate currently controlled. Continue  toprol xl 25 mg daily and xarelto 15 mg daily for anticoagulation  Leukocytosis Recently treated for UTI. Afebrile. Monitor cbc with diff  Constipation Start senna s 2 tab qhs with miralax daily prn for now and adjust medication accordingly  Right rib cage pain Tender to palpation, no signs of bruise noted. Denies pain with deep inspiration. Lung clear on exam. Start tylenol 650 mg tid x 3 days and reassess if no improvement  Non obstructing renal calculus Denies flank pain and hematuria, afebrile, monitor clinically, hydration encouraged  Chronic diastolic chf Appears euvolemic, has LE edema. Continue lasix 40 mg daily and kcl 20 meq daily. Monitor bmp  Anemia of chronic disease Monitor cbc  ckd stage 3 Monitor renal function with her on lasix and with recent renal calculus noted on ct scan    Goals of care: short term rehabilitation   Labs/tests ordered: cbc, cmp  Family/ staff Communication: reviewed care plan with patient and nursing supervisor    Blanchie Serve, MD  [facility] [phone] (Monday-Friday 8 am - 5 pm) [phone] (afterhours)

## 2015-01-12 ENCOUNTER — Inpatient Hospital Stay (HOSPITAL_COMMUNITY)
Admission: EM | Admit: 2015-01-12 | Discharge: 2015-01-16 | DRG: 300 | Disposition: A | Discharge status: Skilled Nursing Facility | Payer: Medicare Other | Attending: Internal Medicine | Admitting: Internal Medicine

## 2015-01-12 ENCOUNTER — Encounter (HOSPITAL_COMMUNITY): Payer: Self-pay

## 2015-01-12 DIAGNOSIS — L03116 Cellulitis of left lower limb: Secondary | ICD-10-CM | POA: Diagnosis present

## 2015-01-12 DIAGNOSIS — I2510 Atherosclerotic heart disease of native coronary artery without angina pectoris: Secondary | ICD-10-CM | POA: Diagnosis present

## 2015-01-12 DIAGNOSIS — K219 Gastro-esophageal reflux disease without esophagitis: Secondary | ICD-10-CM | POA: Diagnosis present

## 2015-01-12 DIAGNOSIS — I480 Paroxysmal atrial fibrillation: Secondary | ICD-10-CM | POA: Diagnosis present

## 2015-01-12 DIAGNOSIS — I82409 Acute embolism and thrombosis of unspecified deep veins of unspecified lower extremity: Secondary | ICD-10-CM | POA: Diagnosis present

## 2015-01-12 DIAGNOSIS — I82432 Acute embolism and thrombosis of left popliteal vein: Secondary | ICD-10-CM

## 2015-01-12 DIAGNOSIS — Z7901 Long term (current) use of anticoagulants: Secondary | ICD-10-CM

## 2015-01-12 DIAGNOSIS — N183 Chronic kidney disease, stage 3 unspecified: Secondary | ICD-10-CM | POA: Diagnosis present

## 2015-01-12 DIAGNOSIS — L03115 Cellulitis of right lower limb: Secondary | ICD-10-CM | POA: Diagnosis present

## 2015-01-12 DIAGNOSIS — N184 Chronic kidney disease, stage 4 (severe): Secondary | ICD-10-CM | POA: Diagnosis present

## 2015-01-12 DIAGNOSIS — I130 Hypertensive heart and chronic kidney disease with heart failure and stage 1 through stage 4 chronic kidney disease, or unspecified chronic kidney disease: Secondary | ICD-10-CM | POA: Diagnosis present

## 2015-01-12 DIAGNOSIS — D638 Anemia in other chronic diseases classified elsewhere: Secondary | ICD-10-CM | POA: Diagnosis present

## 2015-01-12 DIAGNOSIS — I472 Ventricular tachycardia: Secondary | ICD-10-CM | POA: Diagnosis present

## 2015-01-12 DIAGNOSIS — Z951 Presence of aortocoronary bypass graft: Secondary | ICD-10-CM

## 2015-01-12 DIAGNOSIS — M7989 Other specified soft tissue disorders: Secondary | ICD-10-CM | POA: Diagnosis not present

## 2015-01-12 DIAGNOSIS — E785 Hyperlipidemia, unspecified: Secondary | ICD-10-CM | POA: Diagnosis present

## 2015-01-12 DIAGNOSIS — I5032 Chronic diastolic (congestive) heart failure: Secondary | ICD-10-CM | POA: Diagnosis present

## 2015-01-12 DIAGNOSIS — G629 Polyneuropathy, unspecified: Secondary | ICD-10-CM | POA: Diagnosis present

## 2015-01-12 DIAGNOSIS — I82442 Acute embolism and thrombosis of left tibial vein: Secondary | ICD-10-CM | POA: Diagnosis present

## 2015-01-12 DIAGNOSIS — I5042 Chronic combined systolic (congestive) and diastolic (congestive) heart failure: Secondary | ICD-10-CM | POA: Diagnosis present

## 2015-01-12 DIAGNOSIS — Z87891 Personal history of nicotine dependence: Secondary | ICD-10-CM

## 2015-01-12 DIAGNOSIS — F0390 Unspecified dementia without behavioral disturbance: Secondary | ICD-10-CM | POA: Diagnosis present

## 2015-01-12 DIAGNOSIS — Z79899 Other long term (current) drug therapy: Secondary | ICD-10-CM

## 2015-01-12 DIAGNOSIS — L0390 Cellulitis, unspecified: Secondary | ICD-10-CM | POA: Diagnosis present

## 2015-01-12 NOTE — ED Provider Notes (Signed)
CSN: [id number]     Arrival date & time 01/12/15  2318 History   First MD Initiated Contact with Patient 01/12/15 2320     Chief Complaint  Patient presents with  . DVT   HPI  Cheryl Zamora is an 81 year old female with PMHx of HTN, CAD, a fib on xarelto and CHF presenting with a DVT. Patient was recently released from hospital after admission for a fib with RVR and sepsis due to UTI and possible diverticulitis. She was discharged to rehab facility 3 days ago. Patient was transferred to [facility] after a positive Doppler for occlusive thrombus in the left popliteal and posterior tibial veins. She is complaining of increased swelling in her bilateral lower extremities with tenderness to palpation. She has also noted some redness and bruising of her feet and up to her mid tibia. Her son is at the bedside and states that the rehabilitation facility put her on antibiotics for cellulitis recently but he is unsure which ones. She has been on eliquis for the past 6 months. She reports compliance with this medication. She has no other complaints at this time. Denies fevers, chills, headache, dizziness, chest pain, SOB, abdominal pain, nausea, vomiting or dysuria.    Past Medical History  Diagnosis Date  . Hyperlipidemia     pt denies this hx on 01/03/2015  . Hypertension   . Coronary artery disease 2005    a. s/p CABG (2012)  . Neuropathy ([place])   . Atrial fibrillation with rapid ventricular response ([place]) 11/2012; 10/2013    a. s/p TEE/DCCV 11/2012 & 10/2013 b. amiodarone caused significant QT prlongation (>650mec)  . Chronic combined systolic and diastolic CHF (congestive heart failure) ([place]   . Osteoarthritis of back   . GERD (gastroesophageal reflux disease)    Past Surgical History  Procedure Laterality Date  . Tonsillectomy    . Cervical disc surgery  1960's    "ruptured disc'  . Tee without cardioversion N/A 12/13/2012    Procedure: TRANSESOPHAGEAL ECHOCARDIOGRAM (TEE);  Surgeon: DLarey Dresser  MD;  [place]  Service: Cardiovascular;  Laterality: N/A;  . Cardioversion N/A 12/13/2012    Procedure: CARDIOVERSION;  Surgeon: DLarey Dresser MD;  [place]  Service: Cardiovascular;  Laterality: N/A;  . Tee without cardioversion N/A 11/13/2013    Procedure: TRANSESOPHAGEAL ECHOCARDIOGRAM (TEE);  Surgeon: KDorothy Spark MD;  [place]  Service: Cardiovascular;  Laterality: N/A;  . Cardioversion N/A 11/13/2013    Procedure: CARDIOVERSION;  Surgeon: KDorothy Spark MD;  [place]  Service: Cardiovascular;  Laterality: N/A;  . Cardioversion N/A 04/12/2014    Procedure: CARDIOVERSION;  Surgeon: PThayer Headings MD;  [facility];  Service: Cardiovascular;  Laterality: N/A;  . Back surgery    . Abdominal hysterectomy    . Cardiac catheterization  04/12/2003    LMain 60, LAD 90, D1 30, CFX 95->T, RCA OK  . Coronary artery bypass graft  04/18/2003    x 4; LIMA-LAD, SVG-D1, SVG-OM1-OM2  . Coronary angioplasty    . Cardioversion N/A 01/06/2015    Procedure: CARDIOVERSION;  Surgeon: DLarey Dresser MD;  [facility];  Service: Cardiovascular;  Laterality: N/A;   Family History  Problem Relation Age of Onset  . Heart attack Mother   . Heart attack Father   . Heart attack Brother   . Heart attack Brother   . Heart attack Brother   . Heart attack Brother    Social History  Substance Use Topics  . Smoking status: Former Smoker -- 1.00 packs/day for 60 years    Types: Cigarettes  . Smokeless tobacco: Never Used  . Alcohol Use: Yes     Comment: 01/03/2015 "might have a beer a couple times/yr"   OB History    No data available     Review of Systems  Constitutional: Negative for fever and chills.  HENT: Negative for congestion and rhinorrhea.   Respiratory: Negative for cough and shortness of breath.   Cardiovascular: Positive for leg swelling. Negative for chest pain and palpitations.  Gastrointestinal:  Negative for nausea, vomiting and abdominal pain.  Genitourinary: Negative for dysuria and flank pain.  Musculoskeletal: Negative for back pain and neck pain.  Skin: Positive for color change.  Neurological: Negative for dizziness, syncope, weakness, numbness and headaches.  Hematological: Bruises/bleeds easily (xarelto).  All other systems reviewed and are negative.     Allergies  Amiodarone; Codeine; and Penicillins  Home Medications   Prior to Admission medications   Medication Sig Start Date End Date Taking? Authorizing Provider  acetaminophen (TYLENOL) 325 MG tablet Take 650 mg by mouth every 6 (six) hours as needed for mild pain.   Yes Historical Provider, MD  furosemide (LASIX) 40 MG tablet Take 1 tablet (40 mg total) by mouth daily. NEED OV. 09/30/14  Yes Peter M Martinique, MD  memantine (NAMENDA) 10 MG tablet Take 10 mg by mouth daily.    Yes Historical Provider, MD  metoprolol succinate (TOPROL-XL) 25 MG 24 hr tablet TAKE 1 TABLET BY MOUTH DAILY 12/23/14  Yes Peter M Martinique, MD  polyethylene glycol [facility] / GLYCOLAX) packet Take 17 g by mouth daily.   Yes Historical Provider, MD  potassium chloride SA (K-DUR,KLOR-CON) 20 MEQ tablet TAKE 1 TABLET BY MOUTH DAILY 12/02/14  Yes Peter M Martinique, MD  Rivaroxaban (XARELTO) 15 MG TABS tablet Take 15 mg by mouth daily.   Yes Historical Provider, MD  saccharomyces boulardii (FLORASTOR) 250 MG capsule Take 250 mg by mouth 2 (two) times daily.   Yes Historical Provider, MD  senna-docusate (SENOKOT-S) 8.6-50 MG tablet Take 2 tablets by mouth at bedtime.   Yes Historical Provider, MD   BP 133/55 mmHg  Pulse 76  Temp(Src) 98.2 F (36.8 C) (Oral)  Resp 23  Ht '5\' 3"'$  (1.6 m)  Wt 166 lb (75.297 kg)  BMI 29.41 kg/m2  SpO2 96% Physical Exam  Constitutional: She is oriented to person, place, and time. She appears well-developed and well-nourished. No distress.  [name] deaf, need to speak loudly for her to understand.  HENT:  Head:  Normocephalic and atraumatic.  Eyes: Conjunctivae are normal. Right eye exhibits no discharge. Left eye exhibits no discharge. No scleral icterus.  Neck: Normal range of motion.  Cardiovascular: Normal rate and regular rhythm.   3+ pitting edema bilateral lower extremities. Left leg slightly more swollen than right.  Pulmonary/Chest: Effort normal. No respiratory distress.  Musculoskeletal: Normal range of motion. She exhibits tenderness.  Full range of motion of knees, ankles and toes intact bilaterally. Bilateral legs from mid tibia to ankle tender to touch.  Neurological: She is alert and oriented to person, place, and time. Coordination normal.  5 out of 5 strength in the bilateral lower extremities. Sensation to light touch intact throughout.  Skin: Skin is warm and dry.     Skin over bilateral lower extremities erythematous, taut and warm to the touch. Area of erythema tender to touch. Area of ecchymosis over the  dorsum of the left foot.  Psychiatric: She has a normal mood and affect. Her behavior is normal.  Nursing note and vitals reviewed.   ED Course  Procedures (including critical care time) Labs Review Labs Reviewed  CBC WITH DIFFERENTIAL/PLATELET - Abnormal; Notable for the following:    Hemoglobin 11.9 (*)    MCH 23.8 (*)    MCHC 29.8 (*)    RDW 16.3 (*)    All other components within normal limits  COMPREHENSIVE METABOLIC PANEL - Abnormal; Notable for the following:    Chloride 99 (*)    Creatinine, Ser 1.10 (*)    GFR calc non Af Amer 43 (*)    GFR calc Af Amer 50 (*)    All other components within normal limits  URINALYSIS, ROUTINE W REFLEX MICROSCOPIC (NOT AT [facility]) - Abnormal; Notable for the following:    APPearance CLOUDY (*)    Hgb urine dipstick MODERATE (*)    All other components within normal limits  APTT - Abnormal; Notable for the following:    aPTT >200 (*)    All other components within normal limits  COMPREHENSIVE METABOLIC PANEL - Abnormal;  Notable for the following:    Chloride 100 (*)    Glucose, Bld 123 (*)    Creatinine, Ser 1.09 (*)    Total Protein 6.1 (*)    Albumin 3.4 (*)    GFR calc non Af Amer 44 (*)    GFR calc Af Amer 51 (*)    All other components within normal limits  CBC WITH DIFFERENTIAL/PLATELET - Abnormal; Notable for the following:    Hemoglobin 11.4 (*)    MCH 24.8 (*)    RDW 16.3 (*)    All other components within normal limits  URINE MICROSCOPIC-ADD ON  CBC    Imaging Review No results found. I have personally reviewed and evaluated these images and lab results as part of my medical decision-making.   EKG Interpretation None      MDM   Final diagnoses:  Acute deep vein thrombosis (DVT) of popliteal vein of left lower extremity (HCC)  Acute deep vein thrombosis (DVT) of tibial vein of left lower extremity [facility])   Patient presenting rehabilitation facility with positive occlusive thrombus in the left popliteal and posterior tibial veins. Patient reports increased swelling, erythema and tenderness of the bilateral lower extremities for the past few days. She is on Xarelto. Attending doctor at rehabilitation facility had patient transferred to [facility] for IV heparin and possibly Coumadin. Vital signs stable. [name] nontoxic appearing in no acute distress. Generalized the bilateral lower extremities are erythematous and warm to the touch. Lower extremities are tender to palpation. She reports that she has been started on antibiotics from her rehabilitation facility but unsure which. Due to failure of Xarelto, will consult hospitalist for admission and anticoagulation. Discussed case with Dr. Hal Hope who recommends starting IV heparin and vancomycin. Last dose of xarelto was 9 AM this morning (11/13). Pt will be admitted to telemetry.     Josephina Gip, PA-C 01/13/15 [place], MD 01/14/15 [phone]

## 2015-01-12 NOTE — ED Notes (Signed)
Per EMS: Pt from [place], sent here for DVT in R leg by doppler. Pt has some cellulitis bilaterally. Pt recently discharged from hospital for Afib. Denies any pain.

## 2015-01-13 ENCOUNTER — Encounter (HOSPITAL_COMMUNITY): Payer: Self-pay | Admitting: Internal Medicine

## 2015-01-13 DIAGNOSIS — I82432 Acute embolism and thrombosis of left popliteal vein: Principal | ICD-10-CM

## 2015-01-13 DIAGNOSIS — I82442 Acute embolism and thrombosis of left tibial vein: Secondary | ICD-10-CM | POA: Diagnosis present

## 2015-01-13 DIAGNOSIS — I472 Ventricular tachycardia: Secondary | ICD-10-CM | POA: Diagnosis present

## 2015-01-13 DIAGNOSIS — G629 Polyneuropathy, unspecified: Secondary | ICD-10-CM | POA: Diagnosis present

## 2015-01-13 DIAGNOSIS — Z951 Presence of aortocoronary bypass graft: Secondary | ICD-10-CM | POA: Diagnosis not present

## 2015-01-13 DIAGNOSIS — I130 Hypertensive heart and chronic kidney disease with heart failure and stage 1 through stage 4 chronic kidney disease, or unspecified chronic kidney disease: Secondary | ICD-10-CM | POA: Diagnosis present

## 2015-01-13 DIAGNOSIS — I82409 Acute embolism and thrombosis of unspecified deep veins of unspecified lower extremity: Secondary | ICD-10-CM | POA: Diagnosis not present

## 2015-01-13 DIAGNOSIS — L03115 Cellulitis of right lower limb: Secondary | ICD-10-CM | POA: Diagnosis present

## 2015-01-13 DIAGNOSIS — L03116 Cellulitis of left lower limb: Secondary | ICD-10-CM

## 2015-01-13 DIAGNOSIS — K219 Gastro-esophageal reflux disease without esophagitis: Secondary | ICD-10-CM | POA: Diagnosis present

## 2015-01-13 DIAGNOSIS — I5042 Chronic combined systolic (congestive) and diastolic (congestive) heart failure: Secondary | ICD-10-CM | POA: Diagnosis present

## 2015-01-13 DIAGNOSIS — Z79899 Other long term (current) drug therapy: Secondary | ICD-10-CM | POA: Diagnosis not present

## 2015-01-13 DIAGNOSIS — F0390 Unspecified dementia without behavioral disturbance: Secondary | ICD-10-CM | POA: Diagnosis present

## 2015-01-13 DIAGNOSIS — I5032 Chronic diastolic (congestive) heart failure: Secondary | ICD-10-CM

## 2015-01-13 DIAGNOSIS — Z87891 Personal history of nicotine dependence: Secondary | ICD-10-CM | POA: Diagnosis not present

## 2015-01-13 DIAGNOSIS — E785 Hyperlipidemia, unspecified: Secondary | ICD-10-CM | POA: Diagnosis present

## 2015-01-13 DIAGNOSIS — I2510 Atherosclerotic heart disease of native coronary artery without angina pectoris: Secondary | ICD-10-CM | POA: Diagnosis present

## 2015-01-13 DIAGNOSIS — I480 Paroxysmal atrial fibrillation: Secondary | ICD-10-CM | POA: Diagnosis present

## 2015-01-13 DIAGNOSIS — D638 Anemia in other chronic diseases classified elsewhere: Secondary | ICD-10-CM | POA: Diagnosis present

## 2015-01-13 DIAGNOSIS — L0390 Cellulitis, unspecified: Secondary | ICD-10-CM | POA: Diagnosis present

## 2015-01-13 DIAGNOSIS — N184 Chronic kidney disease, stage 4 (severe): Secondary | ICD-10-CM | POA: Diagnosis present

## 2015-01-13 DIAGNOSIS — Z7901 Long term (current) use of anticoagulants: Secondary | ICD-10-CM | POA: Diagnosis not present

## 2015-01-13 DIAGNOSIS — M7989 Other specified soft tissue disorders: Secondary | ICD-10-CM | POA: Diagnosis present

## 2015-01-13 LAB — COMPREHENSIVE METABOLIC PANEL
ALBUMIN: 3.4 g/dL — AB (ref 3.5–5.0)
ALK PHOS: 58 U/L (ref 38–126)
ALT: 21 U/L (ref 14–54)
ALT: 23 U/L (ref 14–54)
ANION GAP: 10 (ref 5–15)
AST: 28 U/L (ref 15–41)
AST: 28 U/L (ref 15–41)
Albumin: 3.8 g/dL (ref 3.5–5.0)
Alkaline Phosphatase: 56 U/L (ref 38–126)
Anion gap: 12 (ref 5–15)
BILIRUBIN TOTAL: 0.6 mg/dL (ref 0.3–1.2)
BUN: 11 mg/dL (ref 6–20)
BUN: 8 mg/dL (ref 6–20)
CALCIUM: 9.3 mg/dL (ref 8.9–10.3)
CHLORIDE: 100 mmol/L — AB (ref 101–111)
CHLORIDE: 99 mmol/L — AB (ref 101–111)
CO2: 27 mmol/L (ref 22–32)
CO2: 29 mmol/L (ref 22–32)
CREATININE: 1.1 mg/dL — AB (ref 0.44–1.00)
Calcium: 9.8 mg/dL (ref 8.9–10.3)
Creatinine, Ser: 1.09 mg/dL — ABNORMAL HIGH (ref 0.44–1.00)
GFR calc Af Amer: 50 mL/min — ABNORMAL LOW (ref >60)
GFR calc non Af Amer: 44 mL/min — ABNORMAL LOW (ref >60)
GFR, EST AFRICAN AMERICAN: 51 mL/min — AB (ref >60)
GFR, EST NON AFRICAN AMERICAN: 43 mL/min — AB (ref >60)
GLUCOSE: 123 mg/dL — AB (ref 65–99)
Glucose, Bld: 94 mg/dL (ref 65–99)
Potassium: 4.2 mmol/L (ref 3.5–5.1)
Potassium: 3.5 mmol/L (ref 3.5–5.1)
SODIUM: 139 mmol/L (ref 135–145)
Sodium: 138 mmol/L (ref 135–145)
TOTAL PROTEIN: 6.1 g/dL — AB (ref 6.5–8.1)
TOTAL PROTEIN: 6.6 g/dL (ref 6.5–8.1)
Total Bilirubin: 0.8 mg/dL (ref 0.3–1.2)

## 2015-01-13 LAB — CBC WITH DIFFERENTIAL/PLATELET
BASOS ABS: 0 10*3/uL (ref 0.0–0.1)
BASOS ABS: 0.1 10*3/uL (ref 0.0–0.1)
BASOS PCT: 1 %
Basophils Relative: 0 %
EOS ABS: 0.3 10*3/uL (ref 0.0–0.7)
EOS PCT: 3 %
Eosinophils Absolute: 0.2 10*3/uL (ref 0.0–0.7)
Eosinophils Relative: 2 %
HCT: 36.7 % (ref 36.0–46.0)
HEMATOCRIT: 39.9 % (ref 36.0–46.0)
HEMOGLOBIN: 11.9 g/dL — AB (ref 12.0–15.0)
Hemoglobin: 11.4 g/dL — ABNORMAL LOW (ref 12.0–15.0)
LYMPHS PCT: 32 %
Lymphocytes Relative: 30 %
Lymphs Abs: 3.1 10*3/uL (ref 0.7–4.0)
Lymphs Abs: 2.3 10*3/uL (ref 0.7–4.0)
MCH: 23.8 pg — ABNORMAL LOW (ref 26.0–34.0)
MCH: 24.8 pg — AB (ref 26.0–34.0)
MCHC: 29.8 g/dL — ABNORMAL LOW (ref 30.0–36.0)
MCHC: 31.1 g/dL (ref 30.0–36.0)
MCV: 79.8 fL (ref 78.0–100.0)
MCV: 80 fL (ref 78.0–100.0)
Monocytes Absolute: 0.8 10*3/uL (ref 0.1–1.0)
Monocytes Absolute: 0.6 10*3/uL (ref 0.1–1.0)
Monocytes Relative: 8 %
Monocytes Relative: 8 %
NEUTROS ABS: 5.8 10*3/uL (ref 1.7–7.7)
NEUTROS PCT: 58 %
Neutro Abs: 4.4 10*3/uL (ref 1.7–7.7)
Neutrophils Relative %: 58 %
PLATELETS: 248 10*3/uL (ref 150–400)
Platelets: 270 10*3/uL (ref 150–400)
RBC: 4.6 MIL/uL (ref 3.87–5.11)
RBC: 4.99 MIL/uL (ref 3.87–5.11)
RDW: 16.3 % — ABNORMAL HIGH (ref 11.5–15.5)
RDW: 16.3 % — ABNORMAL HIGH (ref 11.5–15.5)
WBC: 7.7 10*3/uL (ref 4.0–10.5)
WBC: 9.9 10*3/uL (ref 4.0–10.5)

## 2015-01-13 LAB — URINALYSIS, ROUTINE W REFLEX MICROSCOPIC
Bilirubin Urine: NEGATIVE
GLUCOSE, UA: NEGATIVE mg/dL
Ketones, ur: NEGATIVE mg/dL
Leukocytes, UA: NEGATIVE
Nitrite: NEGATIVE
PROTEIN: NEGATIVE mg/dL
Specific Gravity, Urine: 1.008 (ref 1.005–1.030)
Urobilinogen, UA: 0.2 mg/dL (ref 0.0–1.0)
pH: 8 (ref 5.0–8.0)

## 2015-01-13 LAB — APTT: aPTT: 177 seconds — ABNORMAL HIGH (ref 24–37)

## 2015-01-13 LAB — URINE MICROSCOPIC-ADD ON

## 2015-01-13 LAB — PROTIME-INR
INR: 1.57 — ABNORMAL HIGH (ref 0.00–1.49)
Prothrombin Time: 18.8 seconds — ABNORMAL HIGH (ref 11.6–15.2)

## 2015-01-13 LAB — HEPARIN LEVEL (UNFRACTIONATED): HEPARIN UNFRACTIONATED: 1.6 [IU]/mL — AB (ref 0.30–0.70)

## 2015-01-13 MED ORDER — POTASSIUM CHLORIDE CRYS ER 20 MEQ PO TBCR
20.0000 meq | EXTENDED_RELEASE_TABLET | Freq: Every day | ORAL | Status: DC
  Administered 2015-01-13: 20 meq via ORAL
  Filled 2015-01-13: qty 1

## 2015-01-13 MED ORDER — FUROSEMIDE 40 MG PO TABS
40.0000 mg | ORAL_TABLET | Freq: Two times a day (BID) | ORAL | Status: DC
  Administered 2015-01-13 – 2015-01-15 (×4): 40 mg via ORAL
  Filled 2015-01-13 (×4): qty 1

## 2015-01-13 MED ORDER — POLYETHYLENE GLYCOL 3350 17 G PO PACK
17.0000 g | PACK | Freq: Every day | ORAL | Status: DC
  Administered 2015-01-13 – 2015-01-16 (×4): 17 g via ORAL
  Filled 2015-01-13 (×4): qty 1

## 2015-01-13 MED ORDER — SODIUM CHLORIDE 0.9 % IJ SOLN
3.0000 mL | Freq: Two times a day (BID) | INTRAMUSCULAR | Status: DC
  Administered 2015-01-13 – 2015-01-16 (×7): 3 mL via INTRAVENOUS

## 2015-01-13 MED ORDER — VANCOMYCIN HCL IN DEXTROSE 1-5 GM/200ML-% IV SOLN
1000.0000 mg | Freq: Once | INTRAVENOUS | Status: AC
  Administered 2015-01-13: 1000 mg via INTRAVENOUS
  Filled 2015-01-13: qty 200

## 2015-01-13 MED ORDER — SENNOSIDES-DOCUSATE SODIUM 8.6-50 MG PO TABS
2.0000 | ORAL_TABLET | Freq: Every day | ORAL | Status: DC
  Administered 2015-01-13 – 2015-01-15 (×3): 2 via ORAL
  Filled 2015-01-13 (×3): qty 2

## 2015-01-13 MED ORDER — SACCHAROMYCES BOULARDII 250 MG PO CAPS
250.0000 mg | ORAL_CAPSULE | Freq: Two times a day (BID) | ORAL | Status: DC
  Administered 2015-01-13 – 2015-01-16 (×7): 250 mg via ORAL
  Filled 2015-01-13 (×7): qty 1

## 2015-01-13 MED ORDER — ONDANSETRON HCL 4 MG/2ML IJ SOLN: 4.0000 mg | Freq: Four times a day (QID) | INTRAMUSCULAR | Status: DC | PRN

## 2015-01-13 MED ORDER — ONDANSETRON HCL 4 MG PO TABS: 4.0000 mg | ORAL_TABLET | Freq: Four times a day (QID) | ORAL | Status: DC | PRN

## 2015-01-13 MED ORDER — WARFARIN SODIUM 2.5 MG PO TABS
2.5000 mg | ORAL_TABLET | Freq: Once | ORAL | Status: AC
  Administered 2015-01-13: 2.5 mg via ORAL
  Filled 2015-01-13: qty 1

## 2015-01-13 MED ORDER — MEMANTINE HCL 10 MG PO TABS
10.0000 mg | ORAL_TABLET | Freq: Every day | ORAL | Status: DC
  Administered 2015-01-13 – 2015-01-16 (×4): 10 mg via ORAL
  Filled 2015-01-13 (×5): qty 1

## 2015-01-13 MED ORDER — HEPARIN (PORCINE) IN NACL 100-0.45 UNIT/ML-% IJ SOLN
1000.0000 [IU]/h | INTRAMUSCULAR | Status: DC
  Administered 2015-01-13: 1000 [IU]/h via INTRAVENOUS
  Filled 2015-01-13: qty 250

## 2015-01-13 MED ORDER — WARFARIN - PHARMACIST DOSING INPATIENT
Freq: Every day | Status: DC
  Administered 2015-01-13 – 2015-01-15 (×3)

## 2015-01-13 MED ORDER — ACETAMINOPHEN 650 MG RE SUPP: 650.0000 mg | Freq: Four times a day (QID) | RECTAL | Status: DC | PRN

## 2015-01-13 MED ORDER — FUROSEMIDE 40 MG PO TABS
40.0000 mg | ORAL_TABLET | Freq: Every day | ORAL | Status: DC
  Administered 2015-01-13: 40 mg via ORAL
  Filled 2015-01-13: qty 1

## 2015-01-13 MED ORDER — CIPROFLOXACIN IN D5W 400 MG/200ML IV SOLN
400.0000 mg | Freq: Two times a day (BID) | INTRAVENOUS | Status: DC
  Administered 2015-01-13: 400 mg via INTRAVENOUS
  Filled 2015-01-13: qty 200

## 2015-01-13 MED ORDER — METOPROLOL SUCCINATE ER 25 MG PO TB24
25.0000 mg | ORAL_TABLET | Freq: Every day | ORAL | Status: DC
  Administered 2015-01-13 – 2015-01-16 (×4): 25 mg via ORAL
  Filled 2015-01-13 (×6): qty 1

## 2015-01-13 MED ORDER — DEXTROSE 5 % IV SOLN
1.0000 g | INTRAVENOUS | Status: DC
  Administered 2015-01-13 – 2015-01-14 (×2): 1 g via INTRAVENOUS
  Filled 2015-01-13 (×3): qty 10

## 2015-01-13 MED ORDER — ACETAMINOPHEN 325 MG PO TABS
650.0000 mg | ORAL_TABLET | Freq: Four times a day (QID) | ORAL | Status: DC | PRN
  Administered 2015-01-14: 650 mg via ORAL
  Filled 2015-01-13: qty 2

## 2015-01-13 MED ORDER — ONDANSETRON HCL 4 MG/2ML IJ SOLN: 4.0000 mg | Freq: Three times a day (TID) | INTRAMUSCULAR | Status: DC | PRN

## 2015-01-13 MED ORDER — ACETAMINOPHEN 325 MG PO TABS
650.0000 mg | ORAL_TABLET | Freq: Once | ORAL | Status: AC
  Administered 2015-01-13: 650 mg via ORAL
  Filled 2015-01-13: qty 2

## 2015-01-13 MED ORDER — VANCOMYCIN HCL IN DEXTROSE 750-5 MG/150ML-% IV SOLN
750.0000 mg | INTRAVENOUS | Status: DC
  Administered 2015-01-14 – 2015-01-15 (×2): 750 mg via INTRAVENOUS
  Filled 2015-01-13 (×2): qty 150

## 2015-01-13 MED ORDER — POTASSIUM CHLORIDE CRYS ER 20 MEQ PO TBCR
20.0000 meq | EXTENDED_RELEASE_TABLET | Freq: Two times a day (BID) | ORAL | Status: DC
Start: 2015-01-13 — End: 2015-01-15
  Administered 2015-01-13 – 2015-01-15 (×4): 20 meq via ORAL
  Filled 2015-01-13 (×4): qty 1

## 2015-01-13 MED ORDER — HEPARIN (PORCINE) IN NACL 100-0.45 UNIT/ML-% IJ SOLN
800.0000 [IU]/h | INTRAMUSCULAR | Status: DC
Start: 2015-01-13 — End: 2015-01-14
  Administered 2015-01-13: 800 [IU]/h via INTRAVENOUS

## 2015-01-13 NOTE — Progress Notes (Signed)
ANTIBIOTIC CONSULT NOTE - INITIAL  Pharmacy Consult for Vancomycin  Indication: cellulitis  Allergies  Allergen Reactions  . Amiodarone     Excessive QT prolongation, nausea  . Codeine Nausea And Vomiting  . Penicillins Nausea Only    Patient Measurements:   Adjusted Body Weight: 70 kg  Vital Signs: BP: 145/53 mmHg (11/14 0030) Pulse Rate: 81 (11/14 0030) Intake/Output from previous day:   Intake/Output from this shift:    Labs:  Recent Labs  01/13/15 0034  WBC 9.9  HGB 11.9*  PLT 270  CREATININE 1.10*   Estimated Creatinine Clearance: 33.5 mL/min (by C-G formula based on Cr of 1.1). No results for input(s): VANCOTROUGH, VANCOPEAK, VANCORANDOM, GENTTROUGH, GENTPEAK, GENTRANDOM, TOBRATROUGH, TOBRAPEAK, TOBRARND, AMIKACINPEAK, AMIKACINTROU, AMIKACIN in the last 72 hours.   Microbiology: Recent Results (from the past 720 hour(s))  Blood culture (routine x 2)     Status: None   Collection Time: 01/02/15 12:20 PM  Result Value Ref Range Status   Specimen Description BLOOD LEFT FOREARM  Final   Special Requests BOTTLES DRAWN AEROBIC ONLY 2ML  Final   Culture NO GROWTH 5 DAYS  Final   Report Status 01/07/2015 FINAL  Final  Blood culture (routine x 2)     Status: None   Collection Time: 01/02/15 12:38 PM  Result Value Ref Range Status   Specimen Description BLOOD LEFT ANTECUBITAL  Final   Special Requests BOTTLES DRAWN AEROBIC AND ANAEROBIC 10ML  Final   Culture NO GROWTH 5 DAYS  Final   Report Status 01/07/2015 FINAL  Final  MRSA PCR Screening     Status: None   Collection Time: 01/02/15  6:51 PM  Result Value Ref Range Status   MRSA by PCR NEGATIVE NEGATIVE Final    Comment:        The GeneXpert MRSA Assay (FDA approved for NASAL specimens only), is one component of a comprehensive MRSA colonization surveillance program. It is not intended to diagnose MRSA infection nor to guide or monitor treatment for MRSA infections.     Medical History: Past  Medical History  Diagnosis Date  . Hyperlipidemia     pt denies this hx on 01/03/2015  . Hypertension   . Coronary artery disease 2005    a. s/p CABG (2012)  . Neuropathy ([place])   . Atrial fibrillation with rapid ventricular response ([place]) 11/2012; 10/2013    a. s/p TEE/DCCV 11/2012 & 10/2013 b. amiodarone caused significant QT prlongation (>666mec)  . Chronic combined systolic and diastolic CHF (congestive heart failure) ([place]   . Osteoarthritis of back   . GERD (gastroesophageal reflux disease)     Medications:  Lasix  Toprol XL  KCl  APAP  Namenda  Miralax  Florastor  Senokot Xarelto 15 mg daily   Assessment: 81y.o. female with LE cellulitis for empiric antibiotics  Goal of Therapy:  Vancomycin trough level 10-15 mcg/ml  Plan:  Vancomycin 1 IV now, then 750 mg IV q24h  ACaryl Pina11/14/2016,2:02 AM

## 2015-01-13 NOTE — NC FL2 (Signed)
[place] LEVEL OF CARE SCREENING TOOL     IDENTIFICATION  Patient Name: Cheryl Zamora Birthdate: April 08, 1923 Sex: female Admission Date (Current Location): 01/12/2015  [facility] and [place] Number: Herbalist and Address:  [facility] [place]. [facility], [place] [address] [place],  [address]      Provider Number: [id number]  Attending Physician Name and Address:  Rise Patience, MD  Relative Name and Phone Number:  Phil    Current Level of Care: Hospital Recommended Level of Care: [place] Prior Approval Number:    Date Approved/Denied:   PASRR Number: [id number]  Discharge Plan: SNF    Current Diagnoses: Patient Active Problem List   Diagnosis Date Noted  . DVT (deep venous thrombosis) ([place]) 01/13/2015  . Cellulitis of both lower extremities 01/13/2015  . Cellulitis 01/13/2015  . Acute deep vein thrombosis (DVT) of popliteal vein of left lower extremity ([place])   . Hematuria   . Acute on chronic renal insufficiency ([place]) 01/06/2015  . Atrial fibrillation with RVR ([place])   . Sepsis ([place]) 01/02/2015  . UTI (lower urinary tract infection) 01/02/2015  . Paroxysmal atrial fibrillation (HCC)   . Chronic diastolic (congestive) heart failure ([place])   . Atrial fibrillation with rapid ventricular response ([place])   . Acute on chronic combined systolic and diastolic congestive heart failure ([place]) 04/12/2014  . CKD (chronic kidney disease) stage 3, GFR 30-59 ml/min 04/11/2014  . CAD, multiple vessel, hx CABG 2012 04/11/2014  . Long term current use of anticoagulant therapy 12/21/2012  . Physical deconditioning 12/17/2012  . PAF (paroxysmal atrial fibrillation) ([place]) 12/12/2012  . Chronic diastolic CHF (congestive heart failure), NYHA class 3 ([place]) 12/12/2012  . Essential hypertension   . Neuropathy ([place])     Orientation ACTIVITIES/SOCIAL BLADDER RESPIRATION    Self, Time    Continent Normal  BEHAVIORAL SYMPTOMS/MOOD  NEUROLOGICAL BOWEL NUTRITION STATUS      Continent Diet (cardiac)  PHYSICIAN VISITS COMMUNICATION OF NEEDS Height & Weight Skin    Verbally   166 lbs. PU Stage and Appropriate Care PU Stage 1 Dressing:  (every 5 days)        AMBULATORY STATUS RESPIRATION    Supervision limited Normal      Personal Care Assistance Level of Assistance  Bathing, Dressing Bathing Assistance: Limited assistance   Dressing Assistance: Limited assistance      Functional Limitations Info                SPECIAL CARE FACTORS FREQUENCY  PT (By licensed PT)     PT Frequency: 5/wk             Additional Factors Info  Code Status Code Status Info: FULL             Current Medications (01/13/2015): Current Facility-Administered Medications  Medication Dose Route Frequency Provider Last Rate Last Dose  . acetaminophen (TYLENOL) tablet 650 mg  650 mg Oral Q6H PRN Rise Patience, MD       Or  . acetaminophen (TYLENOL) suppository 650 mg  650 mg Rectal Q6H PRN Rise Patience, MD      . ciprofloxacin (CIPRO) IVPB 400 mg  400 mg Intravenous Q12H Leodis Sias, RPH   400 mg at 01/13/15 1024  . furosemide (LASIX) tablet 40 mg  40 mg Oral Daily Rise Patience, MD   40 mg at 01/13/15 1025  . heparin ADULT infusion 100 units/mL (25000 units/250 mL)  1,000  Units/hr Intravenous Continuous Rise Patience, MD 10 mL/hr at 01/13/15 0238 1,000 Units/hr at 01/13/15 0238  . memantine (NAMENDA) tablet 10 mg  10 mg Oral Daily Rise Patience, MD   10 mg at 01/13/15 1025  . metoprolol succinate (TOPROL-XL) 24 hr tablet 25 mg  25 mg Oral Daily Rise Patience, MD   25 mg at 01/13/15 1025  . ondansetron (ZOFRAN) tablet 4 mg  4 mg Oral Q6H PRN Rise Patience, MD       Or  . ondansetron [facility]) injection 4 mg  4 mg Intravenous Q6H PRN Rise Patience, MD      . polyethylene glycol (MIRALAX / GLYCOLAX) packet 17 g  17 g Oral Daily Rise Patience, MD   17 g at 01/13/15 1025  .  potassium chloride SA (K-DUR,KLOR-CON) CR tablet 20 mEq  20 mEq Oral Daily Rise Patience, MD   20 mEq at 01/13/15 1026  . saccharomyces boulardii (FLORASTOR) capsule 250 mg  250 mg Oral BID Rise Patience, MD   250 mg at 01/13/15 1025  . senna-docusate (Senokot-S) tablet 2 tablet  2 tablet Oral QHS Rise Patience, MD      . sodium chloride 0.9 % injection 3 mL  3 mL Intravenous Q12H Rise Patience, MD   3 mL at 01/13/15 1026  . [START ON 01/14/2015] vancomycin (VANCOCIN) IVPB 750 mg/150 ml premix  750 mg Intravenous Q24H Rise Patience, MD       Do not use this list as official medication orders. Please verify with discharge summary.  Discharge Medications:   Medication List    ASK your doctor about these medications        acetaminophen 325 MG tablet  Commonly known as:  TYLENOL  Take 650 mg by mouth every 6 (six) hours as needed for mild pain.     furosemide 40 MG tablet  Commonly known as:  LASIX  Take 1 tablet (40 mg total) by mouth daily. NEED OV.     memantine 10 MG tablet  Commonly known as:  NAMENDA  Take 10 mg by mouth daily.     metoprolol succinate 25 MG 24 hr tablet  Commonly known as:  TOPROL-XL  TAKE 1 TABLET BY MOUTH DAILY     polyethylene glycol packet  Commonly known as:  MIRALAX / GLYCOLAX  Take 17 g by mouth daily.     potassium chloride SA 20 MEQ tablet  Commonly known as:  K-DUR,KLOR-CON  TAKE 1 TABLET BY MOUTH DAILY     Rivaroxaban 15 MG Tabs tablet  Commonly known as:  XARELTO  Take 15 mg by mouth daily.     saccharomyces boulardii 250 MG capsule  Commonly known as:  FLORASTOR  Take 250 mg by mouth 2 (two) times daily.     senna-docusate 8.6-50 MG tablet  Commonly known as:  Senokot-S  Take 2 tablets by mouth at bedtime.        Relevant Imaging Results:  Relevant Lab Results:  Recent Labs    Additional Information SS# [id number]  Cranford Mon, [place]

## 2015-01-13 NOTE — Progress Notes (Signed)
CRITICAL VALUE ALERT  Critical value received:  PTT >200  Date of notification:  01/13/15   Time of notification:  7573  Critical value read back:Yes.    Nurse who received alert:  Fabian Sharp, RN  MD notified (1st page): Conley Canal  Time of first page:  1247  MD notified (2nd page): Conley Canal   Time of second page: 1310  Responding MD:  Conley Canal   Time MD responded:  [phone]

## 2015-01-13 NOTE — Progress Notes (Signed)
Report received from Courtney RN

## 2015-01-13 NOTE — Clinical Social Work Placement (Signed)
   CLINICAL SOCIAL WORK PLACEMENT  NOTE  Date:  01/13/2015  Patient Details  Name: ELLEAN FIRMAN MRN: [id number] Date of Birth: 1923-03-30  Clinical Social Work is seeking post-discharge placement for this patient at the [place] level of care (*CSW will initial, date and re-position this form in  chart as items are completed):  Yes   Patient/family provided with [facility] Work Department's list of facilities offering this level of care within the geographic area requested by the patient (or if unable, by the patient's family).  Yes   Patient/family informed of their freedom to choose among providers that offer the needed level of care, that participate in Medicare, Medicaid or managed care program needed by the patient, have an available bed and are willing to accept the patient.  Yes   Patient/family informed of [place]'s ownership interest in [facility] and [facility], as well as of the fact that they are under no obligation to receive care at these facilities.  PASRR submitted to EDS on       PASRR number received on       Existing PASRR number confirmed on 01/13/15     FL2 transmitted to all facilities in geographic area requested by pt/family on       FL2 transmitted to all facilities within larger geographic area on       Patient informed that his/her managed care company has contracts with or will negotiate with certain facilities, including the following:            Patient/family informed of bed offers received.  Patient chooses bed at       Physician recommends and patient chooses bed at      Patient to be transferred to   on  .  Patient to be transferred to facility by       Patient family notified on   of transfer.  Name of family member notified:        PHYSICIAN Please sign FL2     Additional Comment:    _______________________________________________ Cranford Mon, LCSW 01/13/2015, 11:37 AM

## 2015-01-13 NOTE — Progress Notes (Signed)
ANTICOAGULATION CONSULT NOTE - Initial Consult  Pharmacy Consult for Heparin/cipro Indication: DVT / cellulitis  Allergies  Allergen Reactions  . Amiodarone     Excessive QT prolongation, nausea  . Codeine Nausea And Vomiting  . Penicillins Nausea Only    Patient Measurements: Height: '5\' 3"'$  (160 cm) Weight: 166 lb (75.297 kg) IBW/kg (Calculated) : 52.4 Heparin Dosing Weight: 70 kg  Vital Signs: Temp: 98.2 F (36.8 C) (11/14 0837) Temp Source: Oral (11/14 0837) BP: 133/55 mmHg (11/14 0837) Pulse Rate: 76 (11/14 0837)  Labs:  Recent Labs  01/13/15 0034 01/13/15 1056  HGB 11.9* 11.4*  HCT 39.9 36.7  PLT 270 248  APTT  --  >200*  CREATININE 1.10* 1.09*    Estimated Creatinine Clearance: 34 mL/min (by C-G formula based on Cr of 1.09).   Assessment: 81 y.o. female with new DVT for heparin. On Xarelto at [facility] for Afib, last dose at 0900 11/13. APTT is supratherapeutic (> 200) on heparin 1000 units/hr. No bleeding noted per RN.  Goal of Therapy:  APTT 66-102 while Xarelto interfering with anti-Xa level Heparin level 0.3-0.7 units/ml Monitor platelets by anticoagulation protocol: Yes   [name] also started on vancomycin for B/L LE cellulitis. Now to add cipro, scr 1.09, est. crcl ~ 35 ml/min   Plan:  Hold heparin for 1 hr, then restart at 800 units/hr at 1400 F/u 8 hr aPTT at 2200 F/u plan for oral anticoagulation. Cipro 400 mg IV Q 12 hrs Monitor renal function  Maryanna Shape, PharmD, BCPS  Clinical Pharmacist  Pager: [phone]   01/13/2015,1:02 PM

## 2015-01-13 NOTE — Clinical Social Work Note (Signed)
Clinical Social Work Assessment  Patient Details  Name: Cheryl Zamora MRN: [id number] Date of Birth: 1923-03-15  Date of referral:  01/13/15               Reason for consult:  Facility Placement                Permission sought to share information with:  Family Supports, Chartered certified accountant granted to share information::  Yes, Verbal Permission Granted  Name::     Insurance underwriter::  SNF  Relationship::  nephew/HCPOA  Contact Information:     Housing/Transportation Living arrangements for the past 2 months:  Tax adviser of Information:  Medical laboratory scientific officer Patient Interpreter Needed:  None Criminal Activity/Legal Involvement Pertinent to Current Situation/Hospitalization:  No - Comment as needed Significant Relationships:  Other Family Members Lives with:  Self Do you feel safe going back to the place where you live?  Yes Need for family participation in patient care:  Yes (Comment) (decision making)  Care giving concerns:  Pt normally lives alone and does not have sufficient support given current care needs.   Social Worker assessment / plan:  CSW spoke with pt HCPOA/nephew, Cheryl Zamora, concerning plan for pt at time of DC  Employment status:  Retired Nurse, adult PT Recommendations:  Not assessed at this time Information / Referral to community resources:  [place]  Patient/Family's Response to care: Cheryl Zamora confirmed that pt has been at [facility] for the past several days getting rehab but states he would like to see if [facility] or [facility] has private room since they are closer to home. Pt nephew is agreeable to pt return to [facility] if his other preferences do not have private room available.  Patient/Family's Understanding of and Emotional Response to Diagnosis, Current Treatment, and Prognosis: Cheryl Zamora has no questions or concerns at this time- hopeful that pt will be well enough to continue rehab soon.  Emotional  Assessment Appearance:  Appears stated age Attitude/Demeanor/Rapport:  Unable to Assess Affect (typically observed):  Unable to Assess Orientation:  Oriented to Self, Oriented to Place Alcohol / Substance use:  Not Applicable Psych involvement (Current and /or in the community):  No (Comment)  Discharge Needs  Concerns to be addressed:  Care Coordination Readmission within the last 30 days:  Yes Current discharge risk:  Physical Impairment, Lives alone Barriers to Discharge:  Continued Medical Work up   [employer], LCSW 01/13/2015, 11:34 AM

## 2015-01-13 NOTE — Progress Notes (Signed)
ANTICOAGULATION CONSULT NOTE - Initial Consult  Pharmacy Consult for Heparin Indication: DVT  Allergies  Allergen Reactions  . Amiodarone     Excessive QT prolongation, nausea  . Codeine Nausea And Vomiting  . Penicillins Nausea Only    Patient Measurements:   Heparin Dosing Weight: 70 kg  Vital Signs: BP: 145/53 mmHg (11/14 0030) Pulse Rate: 81 (11/14 0030)  Labs:  Recent Labs  01/13/15 0034  HGB 11.9*  HCT 39.9  PLT 270  CREATININE 1.10*    Estimated Creatinine Clearance: 33.5 mL/min (by C-G formula based on Cr of 1.1).   Medications:  Lasix Toprol XL KCl APAP Namenda Miralax Florastor Senokot Xarelto 15 mg daily    Assessment: 81 y.o. female with new DVT for heparin.  On Xarelto at [facility] for Afib, last dose at 0900 11/13  Goal of Therapy:  APTT 66-102 while Xarelto interfering with anti-Xa level Heparin level 0.3-0.7 units/ml Monitor platelets by anticoagulation protocol: Yes   Plan:  Given new DVT despite Xarelto, while begin heparin now. Start heparin 1000 units/hr Check PTT in 8 hours.  Caryl Pina 01/13/2015,2:08 AM

## 2015-01-13 NOTE — Progress Notes (Signed)
Patient admitted after midnight. Chart reviewed. Patient examined. D/w Dr. Beryle Beams. Change to coumadin.  Continue abx for cellulitis.  Doree Barthel, MD [facility]

## 2015-01-13 NOTE — Progress Notes (Signed)
New Admission Note:  Arrival Method: Ed stretcher  Mental Orientation: A&Ox2 Telemetry: NSR Assessment: Completed Skin: Stage 1 on buttocks prior to admission. Documented in Epic IV: Rt and Lt Forearm Pain: 0 Tubes:0 Safety Measures: Safety Fall Prevention Plan was given, discussed and signed. Admission: Completed [facility] [place] Orientation: Patient has been orientated to the room, unit and the staff. Family: Notified and present  Orders have been reviewed and implemented. Will continue to monitor the patient. Call light has been placed within reach and bed alarm has been activated.   Fabian Sharp, RN  Phone Number: [phone]

## 2015-01-13 NOTE — Progress Notes (Signed)
Pharmacy called about critical lab PTT >200.  Instructed to hold Heparin Drip for one hour and then restart at a rate of 39m/hr

## 2015-01-13 NOTE — Consult Note (Addendum)
WOC wound consult note Reason for Consult: Consult requested for BLE Wound type: No open wounds or drainage, bilat calves with generalized erythremia and edema; appearance consistent with cellulitis. Dressing procedure/placement/frequency: No topical treatment indicated at this time; keep elevated to reduce edema and affected areas may remain open to air unless they begin to blister or weep. Please re-consult if further assistance is needed.  Thank-you,  Julien Girt MSN, [place], [place], [place], [place]

## 2015-01-13 NOTE — H&P (Signed)
[facility] History and Physical  JAMILLE FISHER QIW:[id number] DOB: 06-12-1923 DOA: 01/12/2015  Referring physician: Ms. Ahmed Prima. PA. PCP: Geoffery Lyons, MD  Specialists:  None.  Chief Complaint: Lower extremity swelling.  HPI: Cheryl Zamora is a 81 y.o. female who was recently admitted for A. fib with RVR in the setting of sepsis secondary to UTI and possible diverticulitis was brought in from nursing home, the patient was found to have increasing swelling in the lower extremity since yesterday morning. Dopplers done shows popliteal and tibial vein occlusive thrombus on the left side despite patient being on xarelto. In addition patient is found to have new bilateral lower extremity erythema with mild tenderness concerning for tendinitis. Patient has been started on heparin infusion for left lower extremity DVT and also antibiotics for cellulitis. Patient otherwise denies any chest pain or shortness of breath abdominal pain nausea vomiting or diarrhea.   Review of Systems: As presented in the history of presenting illness, rest negative.  Past Medical History  Diagnosis Date  . Hyperlipidemia     pt denies this hx on 01/03/2015  . Hypertension   . Coronary artery disease 2005    a. s/p CABG (2012)  . Neuropathy ([place])   . Atrial fibrillation with rapid ventricular response ([place]) 11/2012; 10/2013    a. s/p TEE/DCCV 11/2012 & 10/2013 b. amiodarone caused significant QT prlongation (>646mec)  . Chronic combined systolic and diastolic CHF (congestive heart failure) ([place]   . Osteoarthritis of back   . GERD (gastroesophageal reflux disease)    Past Surgical History  Procedure Laterality Date  . Tonsillectomy    . Cervical disc surgery  1960's    "ruptured disc'  . Tee without cardioversion N/A 12/13/2012    Procedure: TRANSESOPHAGEAL ECHOCARDIOGRAM (TEE);  Surgeon: DLarey Dresser MD;  [facility]  Service: Cardiovascular;  Laterality: N/A;  . Cardioversion N/A  12/13/2012    Procedure: CARDIOVERSION;  Surgeon: DLarey Dresser MD;  [facility]  Service: Cardiovascular;  Laterality: N/A;  . Tee without cardioversion N/A 11/13/2013    Procedure: TRANSESOPHAGEAL ECHOCARDIOGRAM (TEE);  Surgeon: KDorothy Spark MD;  [facility]  Service: Cardiovascular;  Laterality: N/A;  . Cardioversion N/A 11/13/2013    Procedure: CARDIOVERSION;  Surgeon: KDorothy Spark MD;  [place] [facility]  Service: Cardiovascular;  Laterality: N/A;  . Cardioversion N/A 04/12/2014    Procedure: CARDIOVERSION;  Surgeon: PThayer Headings MD;  [facility]  Service: Cardiovascular;  Laterality: N/A;  . Back surgery    . Abdominal hysterectomy    . Cardiac catheterization  04/12/2003    L[place] 60, LAD 90, D1 30, CFX 95->T, RCA OK  . Coronary artery bypass graft  04/18/2003    x 4; LIMA-LAD, SVG-D1, SVG-OM1-OM2  . Coronary angioplasty    . Cardioversion N/A 01/06/2015    Procedure: CARDIOVERSION;  Surgeon: DLarey Dresser MD;  [facility]  Service: Cardiovascular;  Laterality: N/A;   Social History:  reports that she has quit smoking. Her smoking use included Cigarettes. She has a 60 pack-year smoking history. She has never used smokeless tobacco. She reports that she drinks alcohol. She reports that she does not use illicit drugs. Where does patient live nursing home. Can patient participate in ADLs? Yes.  Allergies  Allergen Reactions  . Amiodarone     Excessive QT prolongation, nausea  . Codeine Nausea And Vomiting  . Penicillins Nausea Only    Family History:  Family History  Problem Relation Age of Onset  . Heart attack Mother   . Heart attack Father   . Heart attack Brother   . Heart attack Brother   . Heart attack Brother   . Heart attack Brother       Prior to Admission medications   Medication Sig Start Date End Date Taking? Authorizing Provider  acetaminophen (TYLENOL) 325 MG tablet Take 650 mg by mouth  every 6 (six) hours as needed for mild pain.   Yes Historical Provider, MD  furosemide (LASIX) 40 MG tablet Take 1 tablet (40 mg total) by mouth daily. NEED OV. 09/30/14  Yes Peter M Martinique, MD  memantine (NAMENDA) 10 MG tablet Take 10 mg by mouth daily.    Yes Historical Provider, MD  metoprolol succinate (TOPROL-XL) 25 MG 24 hr tablet TAKE 1 TABLET BY MOUTH DAILY 12/23/14  Yes Peter M Martinique, MD  polyethylene glycol [facility] / GLYCOLAX) packet Take 17 g by mouth daily.   Yes Historical Provider, MD  potassium chloride SA (K-DUR,KLOR-CON) 20 MEQ tablet TAKE 1 TABLET BY MOUTH DAILY 12/02/14  Yes Peter M Martinique, MD  Rivaroxaban (XARELTO) 15 MG TABS tablet Take 15 mg by mouth daily.   Yes Historical Provider, MD  saccharomyces boulardii (FLORASTOR) 250 MG capsule Take 250 mg by mouth 2 (two) times daily.   Yes Historical Provider, MD  senna-docusate (SENOKOT-S) 8.6-50 MG tablet Take 2 tablets by mouth at bedtime.   Yes Historical Provider, MD    Physical Exam: Filed Vitals:   01/13/15 0000 01/13/15 0030 01/13/15 0100 01/13/15 0200  BP: 152/60 145/53 137/53 100/84  Pulse: 80 81 73 62  Resp: '23 25 21 14  '$ SpO2: 100% 98% 97% 94%     General:  Moderately built and nourished.  Eyes: Anicteric no pallor.  ENT: No discharge from the ears eyes nose and mouth.  Neck: No mass felt. No JVD appreciated.  Cardiovascular: S1-S2 heard.  Respiratory: No rhonchi or crepitations.  Abdomen: Soft nontender bowel sounds present.  Skin: Bilateral lower extremity erythema extending from the toes up to midcalf.  Musculoskeletal: Bilateral lower extremity edema.  Psychiatric: Appears normal.  Neurologic: Alert and oriented to her name and place motor all extremities.  Labs on Admission:  Basic Metabolic Panel:  Recent Labs Lab 01/07/15 0331 01/08/15 0250 01/13/15 0034  NA 133* 133* 138  K 4.4 3.9 4.2  CL 101 100* 99*  CO2 '25 24 27  '$ GLUCOSE 116* 132* 94  BUN '20 17 11  '$ CREATININE 1.18* 1.19*  1.10*  CALCIUM 9.1 9.0 9.8  MG 2.0  --   --    Liver Function Tests:  Recent Labs Lab 01/13/15 0034  AST 28  ALT 21  ALKPHOS 56  BILITOT 0.6  PROT 6.6  ALBUMIN 3.8   No results for input(s): LIPASE, AMYLASE in the last 168 hours. No results for input(s): AMMONIA in the last 168 hours. CBC:  Recent Labs Lab 01/06/15 1957 01/07/15 0331 01/08/15 0250 01/13/15 0034  WBC 11.5* 10.7* 11.7* 9.9  NEUTROABS 8.2*  --   --  5.8  HGB 10.5* 10.3* 10.6* 11.9*  HCT 34.4* 33.8* 34.6* 39.9  MCV 80.0 79.2 79.0 80.0  PLT 268 278 273 270   Cardiac Enzymes: No results for input(s): CKTOTAL, CKMB, CKMBINDEX, TROPONINI in the last 168 hours.  BNP (last 3 results)  Recent Labs  04/10/14 1337 01/02/15 0923  BNP 413.8* 622.9*    ProBNP (last 3 results) No results for input(s): PROBNP  in the last 8760 hours.  CBG: No results for input(s): GLUCAP in the last 168 hours.  Radiological Exams on Admission: No results found.   Assessment/Plan Principal Problem:   Cellulitis of both lower extremities Active Problems:   PAF (paroxysmal atrial fibrillation) (HCC)   CKD (chronic kidney disease) stage 3, GFR 30-59 ml/min   CAD, multiple vessel, hx CABG 2012   Chronic diastolic (congestive) heart failure (HCC)   DVT (deep venous thrombosis) (HCC)   Cellulitis   1. Cellulitis of the bilateral lower extremity - patient has been empirically placed on vancomycin and Cipro. I also consult wound team. 2. Left lower extremity DVT despite patient being on xarelto - Dopplers results brought from the nursing home shows patient has DVT of the popliteal and tibial veins of the left lower extremity. This despite being patient on xarelto. Patient has been placed on heparin infusion and probably change to Apixaban in a.m the patient is stable. (May discuss with hematologist). 3. Paroxysmal atrial fibrillation - presently rate controlled. Chads 2 vasc score is more than 2. [name] on heparin  infusion presently. Continue metoprolol for rate control. 4. Chronic diastolic and systolic heart failure last EF measured was 45% in early part of this year - appears compensated continue Lasix. 5. C [place] stage III creatinine appears to be at baseline. Closely follow metabolic panel. 6. Chronic anemia - follow CBC.  I have reviewed patient's old charts were labs and also results from the nursing home provided by patient's nephew who is healthcare power of attorney.   DVT Prophylaxis heparin infusion.  Code Status: Full code.  Family Communication: Patient's nephew.  Disposition Plan: Admit to inpatient.    Fusaye Wachtel N. [facility] Pager [phone].  If 7PM-7AM, please contact night-coverage [email or web address] Password [email or web address] 01/13/2015, 3:51 AM

## 2015-01-13 NOTE — ED Notes (Signed)
Admitting at bedside 

## 2015-01-13 NOTE — Care Management Note (Signed)
Case Management Note  Patient Details  Name: Cheryl Zamora MRN: [id number] Date of Birth: 1923/03/30  Subjective/Objective:                 Spoke with patent nephew [name] [phone]. [name] is HC POA. He states that patient is currently at [facility] because [employer] and [employer] could not offer private rooms when patient was recently discharged. [name] would prefer other SNF at discharge due to their proximity to patient's house. [facility] CSW updated and given contact number to POA.   Action/Plan:  Anticipate DC in a few days to SNF.  Expected Discharge Date:                  Expected Discharge Plan:  Skilled Nursing Facility  In-House Referral:  Clinical Social Work  Discharge planning Services  CM Consult  Post Acute Care Choice:    Choice offered to:     DME Arranged:    DME Agency:     HH Arranged:    [place] Agency:     Status of Service:  Completed, signed off  Medicare Important Message Given:    Date Medicare IM Given:    Medicare IM give by:    Date Additional Medicare IM Given:    Additional Medicare Important Message give by:     If discussed at [place] of Stay Meetings, dates discussed:    Additional Comments:  Carles Collet, RN 01/13/2015, 11:28 AM

## 2015-01-13 NOTE — Progress Notes (Signed)
ANTICOAGULATION CONSULT NOTE - Initial Consult  Pharmacy Consult for Warfarin Indication: DVT while on Xarelto  Allergies  Allergen Reactions  . Amiodarone     Excessive QT prolongation, nausea  . Codeine Nausea And Vomiting  . Penicillins Nausea Only    Patient Measurements: Height: '5\' 3"'$  (160 cm) Weight: 166 lb (75.297 kg) IBW/kg (Calculated) : 52.4   Vital Signs: Temp: 97.7 F (36.5 C) (11/14 1345) Temp Source: Oral (11/14 1345) BP: 132/32 mmHg (11/14 1345) Pulse Rate: 65 (11/14 1345)  Labs:  Recent Labs  01/13/15 0034 01/13/15 1056 01/13/15 1715  HGB 11.9* 11.4*  --   HCT 39.9 36.7  --   PLT 270 248  --   APTT  --  >200*  --   LABPROT  --   --  18.8*  INR  --   --  1.57*  CREATININE 1.10* 1.09*  --      Assessment: 81 y.o. female with new DVT; was on Xarelto PTA for afib. Started on heparin drip and pharmacy consulted to start warfarin  INR 1.57 (last dose of Xarelto 11/13) H/H, PLTs stable with no reported bleeding     Goal of Therapy:  INR 2-3 Monitor platelets by anticoagulation protocol: Yes   Plan:  Warfarin 2.5 mg x1 tonight Daily INR while inpatient (elevated at present on Xarelto PTA) CBC Q72H Monitor for sxs of bleeding  Vincenza Hews, PharmD, BCPS 01/13/2015, 6:18 PM Pager: [phone]

## 2015-01-14 DIAGNOSIS — I82409 Acute embolism and thrombosis of unspecified deep veins of unspecified lower extremity: Secondary | ICD-10-CM

## 2015-01-14 LAB — CBC
HCT: 38.5 % (ref 36.0–46.0)
Hemoglobin: 11.5 g/dL — ABNORMAL LOW (ref 12.0–15.0)
MCH: 23.7 pg — ABNORMAL LOW (ref 26.0–34.0)
MCHC: 29.9 g/dL — ABNORMAL LOW (ref 30.0–36.0)
MCV: 79.4 fL (ref 78.0–100.0)
PLATELETS: 250 10*3/uL (ref 150–400)
RBC: 4.85 MIL/uL (ref 3.87–5.11)
RDW: 16.4 % — AB (ref 11.5–15.5)
WBC: 8.4 10*3/uL (ref 4.0–10.5)

## 2015-01-14 LAB — PROTIME-INR
INR: 1.48 (ref 0.00–1.49)
Prothrombin Time: 18 seconds — ABNORMAL HIGH (ref 11.6–15.2)

## 2015-01-14 MED ORDER — WARFARIN SODIUM 2.5 MG PO TABS
2.5000 mg | ORAL_TABLET | Freq: Once | ORAL | Status: AC
  Administered 2015-01-14: 2.5 mg via ORAL
  Filled 2015-01-14: qty 1

## 2015-01-14 MED ORDER — ENOXAPARIN SODIUM 80 MG/0.8ML ~~LOC~~ SOLN
70.0000 mg | SUBCUTANEOUS | Status: DC
  Administered 2015-01-15 – 2015-01-16 (×2): 70 mg via SUBCUTANEOUS
  Filled 2015-01-14 (×2): qty 0.8

## 2015-01-14 MED ORDER — HEPARIN (PORCINE) IN NACL 100-0.45 UNIT/ML-% IJ SOLN: 600.0000 [IU]/h | INTRAMUSCULAR | Status: DC

## 2015-01-14 MED ORDER — ENOXAPARIN SODIUM 80 MG/0.8ML ~~LOC~~ SOLN
1.0000 mg/kg | Freq: Two times a day (BID) | SUBCUTANEOUS | Status: DC
  Administered 2015-01-14: 70 mg via SUBCUTANEOUS
  Filled 2015-01-14: qty 0.8

## 2015-01-14 NOTE — Progress Notes (Signed)
[facility] PROGRESS NOTE  Cheryl Zamora KXF:[id number] DOB: [birth year] DOA: 01/12/2015 PCP: Geoffery Lyons, MD  Assessment/Plan:  Principal Problem:   Cellulitis of both lower extremities: Slightly improved, but would benefit from one to 2 more days of IV antibiotic depending on progress. Continue elevation and Lasix Active Problems:  DVT (deep venous thrombosis) ([place]): failed xarelto. Discussed with Dr. Beryle Beams 11/14. Changed to coumadin. Will change heparin gtt to lovenox bridge   PAF (paroxysmal atrial fibrillation) (HCC) chadsvasc 5   CKD (chronic kidney disease) stage 3, GFR 30-59 ml/min stable   CAD, multiple vessel, hx CABG 2012   Chronic diastolic (congestive) heart failure ([place]): increased lasix due to swelling, weight gain dementia  Code Status:  full Family Communication:  Nephew by phone Disposition Plan:  Back to SNF 1-2 days  Consultants:    Procedures:     Antibiotics:    HPI/Subjective: "legs are terrible. I'm tired" denies dyspnea or CP  Objective: Filed Vitals:   01/14/15 0535  BP: 133/52  Pulse: 82  Temp: 98.2 F (36.8 C)  Resp: 18    Intake/Output Summary (Last 24 hours) at 01/14/15 1205 Last data filed at 01/14/15 1139  Gross per 24 hour  Intake    360 ml  Output   1500 ml  Net  -1140 ml   Filed Weights   01/13/15 0843 01/14/15 0005 01/14/15 0500  Weight: 75.297 kg (166 lb) 76.9 kg (169 lb 8.5 oz) 71.079 kg (156 lb 11.2 oz)    Exam:   General:  Asleep. Arousable. Breathing nonlabored. Cooperative. forgetful  Cardiovascular: Irreg irreg  Respiratory: CTA without MGR  Abdomen: S, NT, ND  Ext: less LE edema. Bilateral cellulitis slightly improved.  Basic Metabolic Panel:  Recent Labs Lab 01/08/15 0250 01/13/15 0034 01/13/15 1056  NA 133* 138 139  K 3.9 4.2 3.5  CL 100* 99* 100*  CO2 '24 27 29  '$ GLUCOSE 132* 94 123*  BUN '17 11 8  '$ CREATININE 1.19* 1.10* 1.09*  CALCIUM 9.0 9.8 9.3   Liver Function  Tests:  Recent Labs Lab 01/13/15 0034 01/13/15 1056  AST 28 28  ALT 21 23  ALKPHOS 56 58  BILITOT 0.6 0.8  PROT 6.6 6.1*  ALBUMIN 3.8 3.4*   No results for input(s): LIPASE, AMYLASE in the last 168 hours. No results for input(s): AMMONIA in the last 168 hours. CBC:  Recent Labs Lab 01/08/15 0250 01/13/15 0034 01/13/15 1056 01/14/15 0925  WBC 11.7* 9.9 7.7 8.4  NEUTROABS  --  5.8 4.4  --   HGB 10.6* 11.9* 11.4* 11.5*  HCT 34.6* 39.9 36.7 38.5  MCV 79.0 80.0 79.8 79.4  PLT 273 270 248 250   Cardiac Enzymes: No results for input(s): CKTOTAL, CKMB, CKMBINDEX, TROPONINI in the last 168 hours. BNP (last 3 results)  Recent Labs  04/10/14 1337 01/02/15 0923  BNP 413.8* 622.9*    ProBNP (last 3 results) No results for input(s): PROBNP in the last 8760 hours.  CBG: No results for input(s): GLUCAP in the last 168 hours.  No results found for this or any previous visit (from the past 240 hour(s)).   Studies: No results found.  Scheduled Meds: . cefTRIAXone (ROCEPHIN)  IV  1 g Intravenous Q24H  . [START ON 01/15/2015] enoxaparin (LOVENOX) injection  70 mg Subcutaneous Q24H  . furosemide  40 mg Oral BID  . memantine  10 mg Oral Daily  . metoprolol succinate  25 mg Oral Daily  . polyethylene glycol  17 g Oral Daily  . potassium chloride SA  20 mEq Oral BID  . saccharomyces boulardii  250 mg Oral BID  . senna-docusate  2 tablet Oral QHS  . sodium chloride  3 mL Intravenous Q12H  . vancomycin  750 mg Intravenous Q24H  . Warfarin - Pharmacist Dosing Inpatient   Does not apply q1800   Continuous Infusions:   Time spent: 35 minutes  [facility] [email or web address], password [facility] 01/14/2015, 12:05 PM  LOS: 1 day

## 2015-01-14 NOTE — Progress Notes (Signed)
Awaiting PT evaluation for patient- this information is required to obtain authorization request for [facility].  Order in place this afternoon. Once evaluation is completed- CSW can request insurance auth pending medical stability per MD (possibly next 1-2 days).  Lorie Phenix. Pauline Good, [place]

## 2015-01-14 NOTE — Discharge Instructions (Addendum)
Get CBC, CMP, INR, checked  By SNF MD in 2 days.    Activity: As tolerated with Full fall precautions use walker/cane & assistance as needed   Disposition SNF   Diet: Heart Healthy .  For Heart failure patients - Check your Weight same time everyday, if you gain over 2 pounds, or you develop in leg swelling, experience more shortness of breath or chest pain, call your Primary MD immediately. Follow Cardiac Low Salt Diet and 1.5 lit/day fluid restriction.   On your next visit with your primary care physician please Get Medicines reviewed and adjusted.   Please request your Prim.MD to go over all Hospital Tests and Procedure/Radiological results at the follow up, please get all Hospital records sent to your Prim MD by signing hospital release before you go home.   If you experience worsening of your admission symptoms, develop shortness of breath, life threatening emergency, suicidal or homicidal thoughts you must seek medical attention immediately by calling 911 or calling your MD immediately  if symptoms less severe.  You Must read complete instructions/literature along with all the possible adverse reactions/side effects for all the Medicines you take and that have been prescribed to you. Take any new Medicines after you have completely understood and accpet all the possible adverse reactions/side effects.   Do not drive, operating heavy machinery, perform activities at heights, swimming or participation in water activities or provide baby sitting services if your were admitted for syncope or siezures until you have seen by Primary MD or a Neurologist and advised to do so again.  Do not drive when taking Pain medications.    Do not take more than prescribed Pain, Sleep and Anxiety Medications  Special Instructions: If you have smoked or chewed Tobacco  in the last 2 yrs please stop smoking, stop any regular Alcohol  and or any Recreational drug use.  Wear Seat belts while  driving.   Please note  You were cared for by a hospitalist during your hospital stay. If you have any questions about your discharge medications or the care you received while you were in the hospital after you are discharged, you can call the unit and asked to speak with the hospitalist on call if the hospitalist that took care of you is not available. Once you are discharged, your primary care physician will handle any further medical issues. Please note that NO REFILLS for any discharge medications will be authorized once you are discharged, as it is imperative that you return to your primary care physician (or establish a relationship with a primary care physician if you do not have one) for your aftercare needs so that they can reassess your need for medications and monitor your lab values.     Information on my medicine - Coumadin   (Warfarin)  This medication education was reviewed with me or my healthcare representative as part of my discharge preparation.   Why was Coumadin prescribed for you? Coumadin was prescribed for you because you have a blood clot or a medical condition that can cause an increased risk of forming blood clots. Blood clots can cause serious health problems by blocking the flow of blood to the heart, lung, or brain. Coumadin can prevent harmful blood clots from forming. As a reminder your indication for Coumadin is:   Deep Vein Thrombosis Treatment  What test will check on my response to Coumadin? While on Coumadin (warfarin) you will need to have an INR test regularly to ensure that  your dose is keeping you in the desired range. The INR (international normalized ratio) number is calculated from the result of the laboratory test called prothrombin time (PT).  If an INR APPOINTMENT HAS NOT ALREADY BEEN MADE FOR YOU please schedule an appointment to have this lab work done by your health care provider within 7 days. Your INR goal is usually a number between:  2 to  3 or your provider may give you a more narrow range like 2-2.5.  Ask your health care provider during an office visit what your goal INR is.  What  do you need to  know  About  COUMADIN? Take Coumadin (warfarin) exactly as prescribed by your healthcare provider about the same time each day.  DO NOT stop taking without talking to the doctor who prescribed the medication.  Stopping without other blood clot prevention medication to take the place of Coumadin may increase your risk of developing a new clot or stroke.  Get refills before you run out.  What do you do if you miss a dose? If you miss a dose, take it as soon as you remember on the same day then continue your regularly scheduled regimen the next day.  Do not take two doses of Coumadin at the same time.  Important Safety Information A possible side effect of Coumadin (Warfarin) is an increased risk of bleeding. You should call your healthcare provider right away if you experience any of the following: ? Bleeding from an injury or your nose that does not stop. ? Unusual colored urine (red or dark brown) or unusual colored stools (red or black). ? Unusual bruising for unknown reasons. ? A serious fall or if you hit your head (even if there is no bleeding).  Some foods or medicines interact with Coumadin (warfarin) and might alter your response to warfarin. To help avoid this: ? Eat a balanced diet, maintaining a consistent amount of Vitamin K. ? Notify your provider about major diet changes you plan to make. ? Avoid alcohol or limit your intake to 1 drink for women and 2 drinks for men per day. (1 drink is 5 oz. wine, 12 oz. beer, or 1.5 oz. liquor.)  Make sure that ANY health care provider who prescribes medication for you knows that you are taking Coumadin (warfarin).  Also make sure the healthcare provider who is monitoring your Coumadin knows when you have started a new medication including herbals and non-prescription  products.  Coumadin (Warfarin)  Major Drug Interactions  Increased Warfarin Effect Decreased Warfarin Effect  Alcohol (large quantities) Antibiotics (esp. Septra/Bactrim, Flagyl, Cipro) Amiodarone (Cordarone) Aspirin (ASA) Cimetidine (Tagamet) Megestrol (Megace) NSAIDs (ibuprofen, naproxen, etc.) Piroxicam (Feldene) Propafenone (Rythmol SR) Propranolol (Inderal) Isoniazid (INH) Posaconazole (Noxafil) Barbiturates (Phenobarbital) Carbamazepine (Tegretol) Chlordiazepoxide (Librium) Cholestyramine (Questran) Griseofulvin Oral Contraceptives Rifampin Sucralfate (Carafate) Vitamin K   Coumadin (Warfarin) Major Herbal Interactions  Increased Warfarin Effect Decreased Warfarin Effect  Garlic Ginseng Ginkgo biloba Coenzyme Q10 Green tea St. Johns wort    Coumadin (Warfarin) FOOD Interactions  Eat a consistent number of servings per week of foods HIGH in Vitamin K (1 serving =  cup)  Collards (cooked, or boiled & drained) Kale (cooked, or boiled & drained) Mustard greens (cooked, or boiled & drained) Parsley *serving size only =  cup Spinach (cooked, or boiled & drained) Swiss chard (cooked, or boiled & drained) Turnip greens (cooked, or boiled & drained)  Eat a consistent number of servings per week of foods MEDIUM-HIGH in Vitamin K (  1 serving = 1 cup)  Asparagus (cooked, or boiled & drained) Broccoli (cooked, boiled & drained, or raw & chopped) Brussel sprouts (cooked, or boiled & drained) *serving size only =  cup Lettuce, raw (green leaf, endive, romaine) Spinach, raw Turnip greens, raw & chopped   These websites have more information on Coumadin (warfarin):  FailFactory.se; VeganReport.com.au;

## 2015-01-14 NOTE — Progress Notes (Signed)
ANTICOAGULATION CONSULT NOTE - Initial Consult  Pharmacy Consult for Coumadin Indication: DVT   Allergies  Allergen Reactions  . Amiodarone     Excessive QT prolongation, nausea  . Codeine Nausea And Vomiting  . Penicillins Nausea Only    Patient Measurements: Height: '5\' 3"'$  (160 cm) Weight: 156 lb 11.2 oz (71.079 kg) IBW/kg (Calculated) : 52.4 Heparin Dosing Weight: 70 kg  Vital Signs: Temp: 98.2 F (36.8 C) (11/15 0535) Temp Source: Oral (11/15 0535) BP: 133/52 mmHg (11/15 0535) Pulse Rate: 82 (11/15 0535)  Labs:  Recent Labs  01/13/15 0034 01/13/15 1056 01/13/15 1715 01/13/15 2233 01/14/15 0925  HGB 11.9* 11.4*  --   --  11.5*  HCT 39.9 36.7  --   --  38.5  PLT 270 248  --   --  250  APTT  --  >200*  --  177*  --   LABPROT  --   --  18.8*  --  18.0*  INR  --   --  1.57*  --  1.48  HEPARINUNFRC  --   --   --  1.60*  --   CREATININE 1.10* 1.09*  --   --   --     Estimated Creatinine Clearance: 33.1 mL/min (by C-G formula based on Cr of 1.09).   Assessment: 81 y.o. female on xarelto PTA for afib, now with new DVT, IV heparin switched to lovenox bridge to coumadin. INR 1.48, might still be affected by xarelto. CBC stable. No bleeding noted per chart.  Goal of Therapy:  INR 2-3 Monitor platelets by anticoagulation protocol: Yes   Plan:  Coumadin 2.5 mg po x 1 Daily PT/INR Adjust lovenox to 70 mg Q 24 hrs (crcl ~ 30 ml/min) D/C lovenox when INR > 2  Maryanna Shape, PharmD, BCPS  Clinical Pharmacist  Pager: [phone]   01/14/2015,12:28 PM

## 2015-01-14 NOTE — Progress Notes (Signed)
[facility] for Heparin Indication: DVT  Allergies  Allergen Reactions  . Amiodarone     Excessive QT prolongation, nausea  . Codeine Nausea And Vomiting  . Penicillins Nausea Only    Patient Measurements: Height: '5\' 3"'$  (160 cm) Weight: 169 lb 8.5 oz (76.9 kg) IBW/kg (Calculated) : 52.4 Heparin Dosing Weight: 70 kg  Vital Signs: Temp: 98 F (36.7 C) (11/14 2154) Temp Source: Oral (11/14 2154) BP: 129/62 mmHg (11/14 2154) Pulse Rate: 59 (11/14 2154)  Labs:  Recent Labs  01/13/15 0034 01/13/15 1056 01/13/15 1715 01/13/15 2233  HGB 11.9* 11.4*  --   --   HCT 39.9 36.7  --   --   PLT 270 248  --   --   APTT  --  >200*  --  177*  LABPROT  --   --  18.8*  --   INR  --   --  1.57*  --   HEPARINUNFRC  --   --   --  1.60*  CREATININE 1.10* 1.09*  --   --     Estimated Creatinine Clearance: 34.4 mL/min (by C-G formula based on Cr of 1.09).  Assessment: 81 y.o. female with new DVT for heparin.    Goal of Therapy:  APTT 66-102 while Xarelto interfering with anti-Xa level Heparin level 0.3-0.7 units/ml Monitor platelets by anticoagulation protocol: Yes   Plan:  Hold heparin x 1 hour, then restart heparin 600 units/hr   Check aPTT in 8 hours.   Caryl Pina 01/14/2015,12:29 AM

## 2015-01-15 DIAGNOSIS — L03116 Cellulitis of left lower limb: Secondary | ICD-10-CM

## 2015-01-15 DIAGNOSIS — L03115 Cellulitis of right lower limb: Secondary | ICD-10-CM

## 2015-01-15 LAB — BASIC METABOLIC PANEL
Anion gap: 10 (ref 5–15)
BUN: 13 mg/dL (ref 6–20)
CO2: 29 mmol/L (ref 22–32)
CREATININE: 1.42 mg/dL — AB (ref 0.44–1.00)
Calcium: 9.5 mg/dL (ref 8.9–10.3)
Chloride: 101 mmol/L (ref 101–111)
GFR, EST AFRICAN AMERICAN: 37 mL/min — AB (ref >60)
GFR, EST NON AFRICAN AMERICAN: 32 mL/min — AB (ref >60)
Glucose, Bld: 95 mg/dL (ref 65–99)
POTASSIUM: 4.8 mmol/L (ref 3.5–5.1)
SODIUM: 140 mmol/L (ref 135–145)

## 2015-01-15 LAB — CBC
HCT: 36.1 % (ref 36.0–46.0)
Hemoglobin: 11 g/dL — ABNORMAL LOW (ref 12.0–15.0)
MCH: 24.3 pg — AB (ref 26.0–34.0)
MCHC: 30.5 g/dL (ref 30.0–36.0)
MCV: 79.7 fL (ref 78.0–100.0)
PLATELETS: 227 10*3/uL (ref 150–400)
RBC: 4.53 MIL/uL (ref 3.87–5.11)
RDW: 16.5 % — ABNORMAL HIGH (ref 11.5–15.5)
WBC: 8.2 10*3/uL (ref 4.0–10.5)

## 2015-01-15 LAB — PROTIME-INR
INR: 1.24 (ref 0.00–1.49)
Prothrombin Time: 15.8 seconds — ABNORMAL HIGH (ref 11.6–15.2)

## 2015-01-15 MED ORDER — WARFARIN SODIUM 5 MG PO TABS
5.0000 mg | ORAL_TABLET | Freq: Once | ORAL | Status: AC
  Administered 2015-01-15: 5 mg via ORAL
  Filled 2015-01-15: qty 1

## 2015-01-15 MED ORDER — DOXYCYCLINE HYCLATE 100 MG PO TABS
100.0000 mg | ORAL_TABLET | Freq: Two times a day (BID) | ORAL | Status: DC
  Administered 2015-01-15 – 2015-01-16 (×3): 100 mg via ORAL
  Filled 2015-01-15 (×3): qty 1

## 2015-01-15 NOTE — Evaluation (Signed)
Physical Therapy Evaluation Patient Details Name: Cheryl Zamora MRN: [id number] DOB: [birth year] Today's Date: 01/15/2015   History of Present Illness  Pt is an 81 y/o female who presents with a LLE DVT. Pt also with BLE cellulitis and LLE swelling. PMH significant for CKD stage IV, anemia, a-fib, chronic heart failure.   Clinical Impression  Pt admitted with above diagnosis. Pt currently with functional limitations due to the deficits listed below (see PT Problem List). At the time of PT eval pt was able to perform transfers and ambulation with min guard assist to min assist. Pt admitted to decreased short term memory over the past 2 weeks. Feel this pt is appropriate to return to SNF for further therapy to maximize strength and independence prior to return home. Pt will benefit from skilled PT to increase their independence and safety with mobility to allow discharge to the venue listed below.       Follow Up Recommendations SNF;Supervision/Assistance - 24 hour    Equipment Recommendations  None recommended by PT    Recommendations for Other Services       Precautions / Restrictions Precautions Precautions: Fall Restrictions Weight Bearing Restrictions: No      Mobility  Bed Mobility               General bed mobility comments: Pt sitting up in recliner upon  PT arrival.   Transfers Overall transfer level: Needs assistance Equipment used: Rolling walker (2 wheeled) Transfers: Sit to/from Stand Sit to Stand: Min guard         General transfer comment: Close guard for safety. Pt was able to power-up to full standing without assist.   Ambulation/Gait Ambulation/Gait assistance: Min assist Ambulation Distance (Feet): 100 Feet Assistive device: Rolling walker (2 wheeled) Gait Pattern/deviations: Step-through pattern;Decreased stride length;Trunk flexed Gait velocity: decreased Gait velocity interpretation: Below normal speed for age/gender General Gait Details:  Frequent cues for walker placement closer to pt's body. Occasional assist to manage walker around turns.   Stairs            Wheelchair Mobility    Modified Rankin (Stroke Patients Only)       Balance Overall balance assessment: Needs assistance Sitting-balance support: Feet supported;No upper extremity supported Sitting balance-Leahy Scale: Fair     Standing balance support: No upper extremity supported Standing balance-Leahy Scale: Poor                               Pertinent Vitals/Pain Pain Assessment: Faces Faces Pain Scale: Hurts little more Pain Location: BLE's at site of cellulitis Pain Descriptors / Indicators: Discomfort Pain Intervention(s): Limited activity within patient's tolerance;Monitored during session;Repositioned    Home Living Family/patient expects to be discharged to:: Skilled nursing facility Living Arrangements: Alone                    Prior Function Level of Independence: Independent with assistive device(s)         Comments: Pt reports using a RW for ambulation. Was unable to recall whether she was requiring assist with ADL's at SNF PTA.     Hand Dominance        Extremity/Trunk Assessment   Upper Extremity Assessment: Defer to OT evaluation           Lower Extremity Assessment: Generalized weakness      Cervical / Trunk Assessment: Kyphotic  Communication   Communication: HOH  Cognition Arousal/Alertness: Awake/alert  Behavior During Therapy: WFL for tasks assessed/performed Overall Cognitive Status: No family/caregiver present to determine baseline cognitive functioning                 General Comments: Pt reports that the events of the past 2 weeks are not clear to her. She is unsure whether she was at home or at a nursing facility (she was at [facility]). Asks therapist if she is at [facility] and if it is November. Does not answer other orientation questions when asked, however appeared  to be thinking hard about the answers.     General Comments      Exercises        Assessment/Plan    PT Assessment Patient needs continued PT services  PT Diagnosis Difficulty walking;Generalized weakness   PT Problem List Decreased strength;Decreased range of motion;Decreased activity tolerance;Decreased balance;Decreased mobility;Decreased knowledge of use of DME;Decreased safety awareness;Decreased knowledge of precautions;Pain  PT Treatment Interventions DME instruction;Gait training;Stair training;Functional mobility training;Therapeutic activities;Therapeutic exercise;Neuromuscular re-education;Patient/family education   PT Goals (Current goals can be found in the Care Plan section) Acute Rehab PT Goals Patient Stated Goal: none stated PT Goal Formulation: With patient Time For Goal Achievement: 01/22/15 Potential to Achieve Goals: Good    Frequency Min 2X/week   Barriers to discharge        Co-evaluation               End of Session Equipment Utilized During Treatment: Gait belt Activity Tolerance: Patient tolerated treatment well Patient left: in chair;with call bell/phone within reach;with chair alarm set Nurse Communication: Mobility status         Time: 1517-6160 PT Time Calculation (min) (ACUTE ONLY): 18 min   Charges:   PT Evaluation $Initial PT Evaluation Tier I: 1 Procedure     PT G CodesRolinda Roan 06-Feb-2015, 2:07 PM   Rolinda Roan, PT, DPT Acute Rehabilitation Services Pager: [phone]

## 2015-01-15 NOTE — Progress Notes (Signed)
ANTICOAGULATION CONSULT NOTE - Initial Consult  Pharmacy Consult for Coumadin/vancomycin Indication: DVT / cellulitis  Allergies  Allergen Reactions  . Amiodarone     Excessive QT prolongation, nausea  . Codeine Nausea And Vomiting  . Penicillins Nausea Only    Patient Measurements: Height: '5\' 3"'$  (160 cm) Weight: 155 lb 6.4 oz (70.489 kg) IBW/kg (Calculated) : 52.4 Heparin Dosing Weight: 70 kg  Vital Signs: Temp: 98.4 F (36.9 C) (11/16 0552) Temp Source: Oral (11/16 0552) BP: 122/41 mmHg (11/16 0552) Pulse Rate: 62 (11/16 0552)  Labs:  Recent Labs  01/13/15 0034 01/13/15 1056 01/13/15 1715 01/13/15 2233 01/14/15 0925 01/15/15 0538  HGB 11.9* 11.4*  --   --  11.5* 11.0*  HCT 39.9 36.7  --   --  38.5 36.1  PLT 270 248  --   --  250 227  APTT  --  >200*  --  177*  --   --   LABPROT  --   --  18.8*  --  18.0* 15.8*  INR  --   --  1.57*  --  1.48 1.24  HEPARINUNFRC  --   --   --  1.60*  --   --   CREATININE 1.10* 1.09*  --   --   --  1.42*    Estimated Creatinine Clearance: 25.3 mL/min (by C-G formula based on Cr of 1.42).   Assessment:  Anticoagulation: 81 y.o. female on xarelto PTA for afib, now with new DVT, changed to coumadin with lovenox bridge for anticoagulation. INR 1.28, she had two doses of coumadin 2.5 mg so far. CBC stable. No bleeding noted per chart.  ID: She is also on D#3 Vancomycin/ctx for cellulitis, afeb, wbc wnl. No cultures. Scr trending up 1.09 > 1.42, UOP 0.8 ml/kg/hr  Vancomycin 11/14 >> Cipro 11/14 x 1 dose CTX: 11/14 >>   Goal of Therapy:  Vancomycin trough 10-15 mcg/ml INR 2-3 Monitor platelets by anticoagulation protocol: Yes   Plan:  Coumadin 5 mg po x 1 Daily PT/INR Lovenox 70 mg Q 24 hrs (crcl ~ 30 ml/min) D/C lovenox when INR > 2 Vancomycin 750 IV Q 24 Vancomycin trough tomorrow morning '@0530'$ , before next dose.  Maryanna Shape, PharmD, BCPS  Clinical Pharmacist  Pager: [phone]   01/15/2015,9:06 AM

## 2015-01-15 NOTE — Progress Notes (Addendum)
CSW updated pt nephew regarding placement- he chooses [facility].  [facility] will have private bed available on 11/17 if pt is stable for DC.  [employer] Medicare auth initiated- auth pending  CSW will continue to follow  Domenica Reamer, [place] Social Worker [phone]

## 2015-01-15 NOTE — Progress Notes (Signed)
Patient Demographics:    Cheryl Zamora, is a 81 y.o. female, DOB - Dec 01, 1923, KTG:[id number]  Admit date - 01/12/2015   Admitting Physician Rise Patience, MD  Outpatient Primary MD for the patient is Geoffery Lyons, MD  LOS - 2   Chief Complaint  Patient presents with  . DVT        Subjective:    Cheryl Zamora today has, No headache, No chest pain, No abdominal pain - No Nausea, No new weakness tingling or numbness, No Cough - SOB.     Assessment  & Plan :     1. Left lower extremity DVT found in the outpatient setting while patient was on xaralto. Previous physician discussed her case with hematologist Dr. Beryle Beams who recommended Lovenox with Coumadin overlap. Pharmacy monitoring. Patient's symptom free.  2. Mild left leg cellulitis. Much improved on empiric antibiotics, taper down to oral doxycycline and monitor.  3. Chronic diastolic and systolic heart failure with EF 45% - currently close to compensated, continue on beta blocker, Lasix skipped today as creatinine top normal for her today.   4. CKD stage IV. Baseline creatinine close to 1.4. Monitor.   5. Anemia of chronic disease. Compensated.   6. Paroxysmal atrial fibrillation with [place] Vasc score of greater than 2. Goal will be rate controlled, continue beta blocker and anticoagulation as in #1 above.    Code Status : Full  Family Communication  : Nephew bedside  Disposition Plan  : SNF in 1-2 days  Consults  :     Procedures  :    DVT Prophylaxis  :  Lovenox / Coumadin  Lab Results  Component Value Date   INR 1.24 01/15/2015   INR 1.48 01/14/2015   INR 1.57* 01/13/2015      Lab Results  Component Value Date   PLT 227 01/15/2015    Inpatient Medications  Scheduled Meds: . doxycycline  100 mg  Oral Q12H  . enoxaparin (LOVENOX) injection  70 mg Subcutaneous Q24H  . memantine  10 mg Oral Daily  . metoprolol succinate  25 mg Oral Daily  . polyethylene glycol  17 g Oral Daily  . saccharomyces boulardii  250 mg Oral BID  . senna-docusate  2 tablet Oral QHS  . sodium chloride  3 mL Intravenous Q12H  . warfarin  5 mg Oral ONCE-1800  . Warfarin - Pharmacist Dosing Inpatient   Does not apply q1800   Continuous Infusions:  PRN Meds:.acetaminophen **OR** [DISCONTINUED] acetaminophen, [DISCONTINUED] ondansetron **OR** ondansetron (ZOFRAN) IV  Antibiotics  :     Anti-infectives    Start     Dose/Rate Route Frequency Ordered Stop   01/15/15 1030  doxycycline (VIBRA-TABS) tablet 100 mg     100 mg Oral Every 12 hours 01/15/15 1026     01/14/15 0600  vancomycin (VANCOCIN) IVPB 750 mg/150 ml premix  Status:  Discontinued     750 mg 150 mL/hr over 60 Minutes Intravenous Every 24 hours 01/13/15 0207 01/15/15 1026   01/13/15 2200  cefTRIAXone (ROCEPHIN) 1 g in dextrose 5 % 50 mL IVPB  Status:  Discontinued    Comments:  AWARE OF PCN ALLERGY   1 g 100 mL/hr over 30 Minutes Intravenous Every 24 hours 01/13/15  1604 01/15/15 1026   01/13/15 1000  ciprofloxacin (CIPRO) IVPB 400 mg  Status:  Discontinued     400 mg 200 mL/hr over 60 Minutes Intravenous Every 12 hours 01/13/15 0856 01/13/15 1604   01/13/15 0215  vancomycin (VANCOCIN) IVPB 1000 mg/200 mL premix     1,000 mg 200 mL/hr over 60 Minutes Intravenous  Once 01/13/15 0207 01/13/15 0709        Objective:   Filed Vitals:   01/14/15 2106 01/14/15 2108 01/15/15 0500 01/15/15 0552  BP: 126/109 129/62  122/41  Pulse: 88 84  62  Temp: 98.1 F (36.7 C)   98.4 F (36.9 C)  TempSrc: Oral   Oral  Resp: 18   18  Height:      Weight:   70.489 kg (155 lb 6.4 oz)   SpO2: 98% 98%  95%    Wt Readings from Last 3 Encounters:  01/15/15 70.489 kg (155 lb 6.4 oz)  01/08/15 74.39 kg (164 lb)  05/09/14 70.353 kg (155 lb 1.6 oz)      Intake/Output Summary (Last 24 hours) at 01/15/15 1026 Last data filed at 01/15/15 1018  Gross per 24 hour  Intake    120 ml  Output   1200 ml  Net  -1080 ml     Physical Exam  Awake Alert, Oriented X 2, No new F.N deficits, Normal affect [place].AT,PERRAL Supple Neck,No JVD, No cervical lymphadenopathy appriciated.  Symmetrical Chest wall movement, Good air movement bilaterally, CTAB RRR,No Gallops,Rubs or new Murmurs, No Parasternal Heave +ve B.Sounds, Abd Soft, No tenderness, No organomegaly appriciated, No rebound - guarding or rigidity. No Cyanosis, Clubbing or edema, No new Rash or bruise       Data Review:   Micro Results No results found for this or any previous visit (from the past 240 hour(s)).  Radiology Reports      CBC  Recent Labs Lab 01/13/15 0034 01/13/15 1056 01/14/15 0925 01/15/15 0538  WBC 9.9 7.7 8.4 8.2  HGB 11.9* 11.4* 11.5* 11.0*  HCT 39.9 36.7 38.5 36.1  PLT 270 248 250 227  MCV 80.0 79.8 79.4 79.7  MCH 23.8* 24.8* 23.7* 24.3*  MCHC 29.8* 31.1 29.9* 30.5  RDW 16.3* 16.3* 16.4* 16.5*  LYMPHSABS 3.1 2.3  --   --   MONOABS 0.8 0.6  --   --   EOSABS 0.2 0.3  --   --   BASOSABS 0.0 0.1  --   --     Chemistries   Recent Labs Lab 01/13/15 0034 01/13/15 1056 01/15/15 0538  NA 138 139 140  K 4.2 3.5 4.8  CL 99* 100* 101  CO2 '27 29 29  '$ GLUCOSE 94 123* 95  BUN '11 8 13  '$ CREATININE 1.10* 1.09* 1.42*  CALCIUM 9.8 9.3 9.5  AST 28 28  --   ALT 21 23  --   ALKPHOS 56 58  --   BILITOT 0.6 0.8  --    ------------------------------------------------------------------------------------------------------------------ estimated creatinine clearance is 25.3 mL/min (by C-G formula based on Cr of 1.42). ------------------------------------------------------------------------------------------------------------------ No results for input(s): HGBA1C in the last 72  hours. ------------------------------------------------------------------------------------------------------------------ No results for input(s): CHOL, HDL, LDLCALC, TRIG, CHOLHDL, LDLDIRECT in the last 72 hours. ------------------------------------------------------------------------------------------------------------------ No results for input(s): TSH, T4TOTAL, T3FREE, THYROIDAB in the last 72 hours.  Invalid input(s): FREET3 ------------------------------------------------------------------------------------------------------------------ No results for input(s): VITAMINB12, FOLATE, FERRITIN, TIBC, IRON, RETICCTPCT in the last 72 hours.  Coagulation profile  Recent Labs Lab 01/13/15 1715 01/14/15 0925 01/15/15 8309  INR 1.57* 1.48 1.24    No results for input(s): DDIMER in the last 72 hours.  Cardiac Enzymes No results for input(s): CKMB, TROPONINI, MYOGLOBIN in the last 168 hours.  Invalid input(s): CK ------------------------------------------------------------------------------------------------------------------ Invalid input(s): POCBNP   Time Spent in minutes 35   SINGH,PRASHANT K M.D on 01/15/2015 at 10:26 AM  Between 7am to 7pm - Pager - [phone]  After 7pm go to [email or web address] - password [facility]  [facility] -  Office  [phone]

## 2015-01-16 LAB — CBC
HEMATOCRIT: 37 % (ref 36.0–46.0)
Hemoglobin: 11 g/dL — ABNORMAL LOW (ref 12.0–15.0)
MCH: 23.7 pg — ABNORMAL LOW (ref 26.0–34.0)
MCHC: 29.7 g/dL — AB (ref 30.0–36.0)
MCV: 79.7 fL (ref 78.0–100.0)
PLATELETS: 221 10*3/uL (ref 150–400)
RBC: 4.64 MIL/uL (ref 3.87–5.11)
RDW: 16.5 % — AB (ref 11.5–15.5)
WBC: 7.4 10*3/uL (ref 4.0–10.5)

## 2015-01-16 LAB — BASIC METABOLIC PANEL
Anion gap: 8 (ref 5–15)
BUN: 16 mg/dL (ref 6–20)
CALCIUM: 9.2 mg/dL (ref 8.9–10.3)
CHLORIDE: 103 mmol/L (ref 101–111)
CO2: 28 mmol/L (ref 22–32)
CREATININE: 1.08 mg/dL — AB (ref 0.44–1.00)
GFR calc non Af Amer: 44 mL/min — ABNORMAL LOW (ref >60)
GFR, EST AFRICAN AMERICAN: 51 mL/min — AB (ref >60)
GLUCOSE: 100 mg/dL — AB (ref 65–99)
Potassium: 3.9 mmol/L (ref 3.5–5.1)
Sodium: 139 mmol/L (ref 135–145)

## 2015-01-16 LAB — PROTIME-INR
INR: 1.22 (ref 0.00–1.49)
PROTHROMBIN TIME: 15.5 s — AB (ref 11.6–15.2)

## 2015-01-16 LAB — MAGNESIUM: Magnesium: 2.1 mg/dL (ref 1.7–2.4)

## 2015-01-16 LAB — TSH: TSH: 2.645 u[IU]/mL (ref 0.350–4.500)

## 2015-01-16 MED ORDER — WARFARIN SODIUM 2 MG PO TABS: 2.0000 mg | ORAL_TABLET | Freq: Every day | ORAL | Status: DC

## 2015-01-16 MED ORDER — METOPROLOL SUCCINATE ER 50 MG PO TB24: 50.0000 mg | ORAL_TABLET | Freq: Every day | ORAL | Status: DC

## 2015-01-16 MED ORDER — DOXYCYCLINE HYCLATE 100 MG PO TABS: 100.0000 mg | ORAL_TABLET | Freq: Two times a day (BID) | ORAL | Status: DC

## 2015-01-16 MED ORDER — WARFARIN SODIUM 7.5 MG PO TABS: 7.5000 mg | ORAL_TABLET | Freq: Once | ORAL | Status: DC

## 2015-01-16 MED ORDER — ENOXAPARIN SODIUM 80 MG/0.8ML ~~LOC~~ SOLN: 70.0000 mg | SUBCUTANEOUS | Status: DC

## 2015-01-16 NOTE — Care Management Important Message (Signed)
Important Message  Patient Details  Name: Cheryl Zamora MRN: [id number] Date of Birth: 1923/03/12   Medicare Important Message Given:  Yes    [name] 01/16/2015, 11:38 AM

## 2015-01-16 NOTE — Discharge Summary (Signed)
Cheryl Zamora, is a 81 y.o. female  DOB 04-14-1923  MRN [id number].  Admission date:  01/12/2015  Admitting Physician  Rise Patience, MD  Discharge Date:  01/16/2015   Primary MD  ARONSON,RICHARD A, MD  Recommendations for primary care physician for things to follow:   Monitor INR in 2 days, check CBC, BMP & magnesium in 7-10 days.  Outpatient cardiology follow-up in 7-10 days   Admission Diagnosis  Acute deep vein thrombosis (DVT) of popliteal vein of left lower extremity (HCC) [I82.432] Acute deep vein thrombosis (DVT) of tibial vein of left lower extremity (HCC) [I82.442]   Discharge Diagnosis  Acute deep vein thrombosis (DVT) of popliteal vein of left lower extremity (HCC) [I82.432] Acute deep vein thrombosis (DVT) of tibial vein of left lower extremity (HCC) [I82.442]     Principal Problem:   Cellulitis of both lower extremities Active Problems:   PAF (paroxysmal atrial fibrillation) (HCC)   CKD (chronic kidney disease) stage 3, GFR 30-59 ml/min   CAD, multiple vessel, hx CABG 2012   Chronic diastolic (congestive) heart failure (HCC)   DVT (deep venous thrombosis) ([place])   Cellulitis      Past Medical History  Diagnosis Date  . Hyperlipidemia     pt denies this hx on 01/03/2015  . Hypertension   . Coronary artery disease 2005    a. s/p CABG (2012)  . Neuropathy ([place])   . Atrial fibrillation with rapid ventricular response ([place]) 11/2012; 10/2013    a. s/p TEE/DCCV 11/2012 & 10/2013 b. amiodarone caused significant QT prlongation (>644mec)  . Chronic combined systolic and diastolic CHF (congestive heart failure) ([place]   . Osteoarthritis of back   . GERD (gastroesophageal reflux disease)     Past Surgical History  Procedure Laterality Date  . Tonsillectomy    . Cervical disc surgery   1960's    "ruptured disc'  . Tee without cardioversion N/A 12/13/2012    Procedure: TRANSESOPHAGEAL ECHOCARDIOGRAM (TEE);  Surgeon: DLarey Dresser MD;  [place] [facility]  Service: Cardiovascular;  Laterality: N/A;  . Cardioversion N/A 12/13/2012    Procedure: CARDIOVERSION;  Surgeon: DLarey Dresser MD;  [place] [facility]  Service: Cardiovascular;  Laterality: N/A;  . Tee without cardioversion N/A 11/13/2013    Procedure: TRANSESOPHAGEAL ECHOCARDIOGRAM (TEE);  Surgeon: KDorothy Spark MD;  [place] [facility]  Service: Cardiovascular;  Laterality: N/A;  . Cardioversion N/A 11/13/2013    Procedure: CARDIOVERSION;  Surgeon: KDorothy Spark MD;  [place] [facility]  Service: Cardiovascular;  Laterality: N/A;  . Cardioversion N/A 04/12/2014    Procedure: CARDIOVERSION;  Surgeon: PThayer Headings MD;  [facility]  Service: Cardiovascular;  Laterality: N/A;  . Back surgery    . Abdominal hysterectomy    . Cardiac catheterization  04/12/2003    L[place] 60, LAD 90, D1 30, CFX 95->T, RCA OK  . Coronary artery bypass graft  04/18/2003    x 4; LIMA-LAD, SVG-D1, SVG-OM1-OM2  . Coronary angioplasty    . Cardioversion  N/A 01/06/2015    Procedure: CARDIOVERSION;  Surgeon: Larey Dresser, MD;  [facility];  Service: Cardiovascular;  Laterality: N/A;       HPI  from the history and physical done on the day of admission:    Cheryl Zamora is a 81 y.o. female who was recently admitted for A. fib with RVR in the setting of sepsis secondary to UTI and possible diverticulitis was brought in from nursing home, the patient was found to have increasing swelling in the lower extremity since yesterday morning. Dopplers done shows popliteal and tibial vein occlusive thrombus on the left side despite patient being on xarelto. In addition patient is found to have new bilateral lower extremity erythema with mild tenderness concerning for tendinitis. Patient has been started  on heparin infusion for left lower extremity DVT and also antibiotics for cellulitis. Patient otherwise denies any chest pain or shortness of breath abdominal pain nausea vomiting or diarrhea.      Hospital Course:     1. Left lower extremity DVT found in the outpatient setting while patient was on xaralto. Previous physician discussed her case with hematologist Dr. Beryle Beams who recommended Lovenox with Coumadin overlap. [name] symptom-free feeling better, no edema or swelling in the legs anymore, continue Coumadin and Lovenox overlap, once INR is 2 or above for 48 hours Lovenox can be stopped. Request SNF to monitor INR every 2 days for the first 7 days.   2. Mild left leg cellulitis. Much improved on empiric antibiotics, taper down to oral doxycycline and monitor.   3. Chronic diastolic and systolic heart failure with EF 45% to 50% - currently close to compensated, continue on beta blocker dose of which has been increased, continue home dose diuretic, note patient had an 12 beat run of asymptomatic V. tach night of 01/15/2015, TSH and electrolytes stable, I have doubled her beta blocker dose, requested her to follow with her primary cardiologist within a week.    4. CKD stage IV. Baseline creatinine close to 1.4. Monitor.   5. Anemia of chronic disease. Compensated.   6. Paroxysmal atrial fibrillation with [place] Vasc score of greater than 2. Goal will be rate controlled, continue beta blocker and anticoagulation as in #1 above. Follow with primary cardiology service within a week post discharge.     Discharge Condition: Fair  Follow UP  Follow-up Information    Follow up with ARONSON,RICHARD A, MD. Schedule an appointment as soon as possible for a visit in 1 week.   Specialty:  Internal Medicine   Contact information:   [place] [address] [phone]       Follow up with Candee Furbish, MD. Schedule an appointment as soon as possible for a visit in 1  week.   Specialty:  Cardiology   Why:  CHF   Contact information:   [address] [place] [address] [phone]        Consults obtained - None  Diet and Activity recommendation: See Discharge Instructions below  Discharge Instructions         Discharge Instructions    Diet - low sodium heart healthy    Complete by:  As directed      Discharge instructions    Complete by:  As directed   Follow with Primary MD ARONSON,RICHARD A, MD in 7 days   Get CBC, CMP, INR, checked  By SNF MD in 2 days.    Activity: As tolerated with  Full fall precautions use walker/cane & assistance as needed   Disposition SNF   Diet: Heart Healthy .  For Heart failure patients - Check your Weight same time everyday, if you gain over 2 pounds, or you develop in leg swelling, experience more shortness of breath or chest pain, call your Primary MD immediately. Follow Cardiac Low Salt Diet and 1.5 lit/day fluid restriction.   On your next visit with your primary care physician please Get Medicines reviewed and adjusted.   Please request your Prim.MD to go over all Hospital Tests and Procedure/Radiological results at the follow up, please get all Hospital records sent to your Prim MD by signing hospital release before you go home.   If you experience worsening of your admission symptoms, develop shortness of breath, life threatening emergency, suicidal or homicidal thoughts you must seek medical attention immediately by calling 911 or calling your MD immediately  if symptoms less severe.  You Must read complete instructions/literature along with all the possible adverse reactions/side effects for all the Medicines you take and that have been prescribed to you. Take any new Medicines after you have completely understood and accpet all the possible adverse reactions/side effects.   Do not drive, operating heavy machinery, perform activities at heights, swimming or participation  in water activities or provide baby sitting services if your were admitted for syncope or siezures until you have seen by Primary MD or a Neurologist and advised to do so again.  Do not drive when taking Pain medications.    Do not take more than prescribed Pain, Sleep and Anxiety Medications  Special Instructions: If you have smoked or chewed Tobacco  in the last 2 yrs please stop smoking, stop any regular Alcohol  and or any Recreational drug use.  Wear Seat belts while driving.   Please note  You were cared for by a hospitalist during your hospital stay. If you have any questions about your discharge medications or the care you received while you were in the hospital after you are discharged, you can call the unit and asked to speak with the hospitalist on call if the hospitalist that took care of you is not available. Once you are discharged, your primary care physician will handle any further medical issues. Please note that NO REFILLS for any discharge medications will be authorized once you are discharged, as it is imperative that you return to your primary care physician (or establish a relationship with a primary care physician if you do not have one) for your aftercare needs so that they can reassess your need for medications and monitor your lab values.     Increase activity slowly    Complete by:  As directed              Discharge Medications       Medication List    STOP taking these medications        Rivaroxaban 15 MG Tabs tablet  Commonly known as:  XARELTO      TAKE these medications        acetaminophen 325 MG tablet  Commonly known as:  TYLENOL  Take 650 mg by mouth every 6 (six) hours as needed for mild pain.     doxycycline 100 MG tablet  Commonly known as:  VIBRA-TABS  Take 1 tablet (100 mg total) by mouth every 12 (twelve) hours. 4 more days     enoxaparin 80 MG/0.8ML injection  Commonly known as:  LOVENOX  Inject 0.7  mLs (70 mg total) into the  skin daily. Stop once INR reaches 2 and stays 2 or above for 48hrs     furosemide 40 MG tablet  Commonly known as:  LASIX  Take 1 tablet (40 mg total) by mouth daily. NEED OV.     memantine 10 MG tablet  Commonly known as:  NAMENDA  Take 10 mg by mouth daily.     metoprolol succinate 50 MG 24 hr tablet  Commonly known as:  TOPROL-XL  Take 1 tablet (50 mg total) by mouth daily.     polyethylene glycol packet  Commonly known as:  MIRALAX / GLYCOLAX  Take 17 g by mouth daily.     potassium chloride SA 20 MEQ tablet  Commonly known as:  K-DUR,KLOR-CON  TAKE 1 TABLET BY MOUTH DAILY     saccharomyces boulardii 250 MG capsule  Commonly known as:  FLORASTOR  Take 250 mg by mouth 2 (two) times daily.     senna-docusate 8.6-50 MG tablet  Commonly known as:  Senokot-S  Take 2 tablets by mouth at bedtime.     warfarin 2 MG tablet  Commonly known as:  COUMADIN  Take 1 tablet (2 mg total) by mouth daily.        Major procedures and Radiology Reports - PLEASE review detailed and final reports for all details, in brief -     Dg Chest Portable 1 View  01/02/2015  CLINICAL DATA:  Chest pain and tachycardia EXAM: PORTABLE CHEST 1 VIEW COMPARISON:  04/10/2014 FINDINGS: Cardiac enlargement with changes of CABG. Negative for heart failure. Lungs are clear without infiltrate or effusion. Negative for pneumonia. IMPRESSION: No active disease. Electronically Signed   By: Franchot Gallo M.D.   On: 01/02/2015 09:41     Micro Results    No results found for this or any previous visit (from the past 240 hour(s)).  Today   Subjective    Cheryl Zamora today has no headache,no chest abdominal pain,no new weakness tingling or numbness, feels much better.   Objective   Blood pressure 139/52, pulse 69, temperature 98.5 F (36.9 C), temperature source Oral, resp. rate 18, height '5\' 3"'$  (1.6 m), weight 69.5 kg (153 lb 3.5 oz), SpO2 94 %.   Intake/Output Summary (Last 24 hours) at 01/16/15  0941 Last data filed at 01/15/15 2333  Gross per 24 hour  Intake    240 ml  Output   1300 ml  Net  -1060 ml    Exam Awake Alert, Oriented x 3, No new F.N deficits, Normal affect [place].AT,PERRAL Supple Neck,No JVD, No cervical lymphadenopathy appriciated.  Symmetrical Chest wall movement, Good air movement bilaterally, CTAB RRR,No Gallops,Rubs or new Murmurs, No Parasternal Heave +ve B.Sounds, Abd Soft, Non tender, No organomegaly appriciated, No rebound -guarding or rigidity. No Cyanosis, Clubbing or edema, No new Rash or bruise, normal bilateral leg erythema without any warmth   Data Review   CBC w Diff:  Lab Results  Component Value Date   WBC 7.4 01/16/2015   HGB 11.0* 01/16/2015   HCT 37.0 01/16/2015   PLT 221 01/16/2015   LYMPHOPCT 30 01/13/2015   MONOPCT 8 01/13/2015   EOSPCT 3 01/13/2015   BASOPCT 1 01/13/2015    CMP:  Lab Results  Component Value Date   NA 139 01/16/2015   K 3.9 01/16/2015   CL 103 01/16/2015   CO2 28 01/16/2015   BUN 16 01/16/2015   CREATININE 1.08* 01/16/2015   PROT 6.1* 01/13/2015  ALBUMIN 3.4* 01/13/2015   BILITOT 0.8 01/13/2015   ALKPHOS 58 01/13/2015   AST 28 01/13/2015   ALT 23 01/13/2015  . Lab Results  Component Value Date   INR 1.22 01/16/2015   INR 1.24 01/15/2015   INR 1.48 01/14/2015     Total Time in preparing paper work, data evaluation and todays exam - 35 minutes  Thurnell Lose M.D on 01/16/2015 at 9:41 AM  [facility]   Office  [phone]

## 2015-01-16 NOTE — Clinical Social Work Placement (Signed)
   CLINICAL SOCIAL WORK PLACEMENT  NOTE  Date:  01/16/2015  Patient Details  Name: Cheryl Zamora MRN: [id number] Date of Birth: Sep 03, 1923  Clinical Social Work is seeking post-discharge placement for this patient at the [place] level of care (*CSW will initial, date and re-position this form in  chart as items are completed):  Yes   Patient/family provided with [facility] Work Department's list of facilities offering this level of care within the geographic area requested by the patient (or if unable, by the patient's family).  Yes   Patient/family informed of their freedom to choose among providers that offer the needed level of care, that participate in Medicare, Medicaid or managed care program needed by the patient, have an available bed and are willing to accept the patient.  Yes   Patient/family informed of [facility]'s ownership interest in [facility] and [facility], as well as of the fact that they are under no obligation to receive care at these facilities.  PASRR submitted to EDS on       PASRR number received on       Existing PASRR number confirmed on 01/13/15     FL2 transmitted to all facilities in geographic area requested by pt/family on       FL2 transmitted to all facilities within larger geographic area on       Patient informed that his/her managed care company has contracts with or will negotiate with certain facilities, including the following:   ([place] required at d/c to SNF)     Yes   Patient/family informed of bed offers received.  Patient chooses bed at [facility]     Physician recommends and patient chooses bed at      Patient to be transferred to [facility] on 01/16/15.  Patient to be transferred to facility by Car with nephew [name]     Patient family notified on 01/16/15 of transfer.  Name of family member notified:  Rudi Heap- Nephew   HCPOA      PHYSICIAN Please sign FL2     Additional Comment: OK per MD for d/c today to short term SNF. CSW spoke with patient's Nephew who wanted to transport patient via car this afternoon and will sign admit papers when they get to the facility.  [employer] medicare auth received today prior to d/c  #[id number].  Patient and nephew are pleased with d/c plan.  Nursing notified to call report to facility.  No further SW needs identified and will sign off.     _______________________________________________ Williemae Area, LCSW 01/16/2015, 2:43 PM

## 2015-01-16 NOTE — Progress Notes (Signed)
Patient had 12 beat run of v-tach on call notified waiting on return call v/s stable patient denies any chest pain

## 2015-01-16 NOTE — Progress Notes (Signed)
ANTICOAGULATION CONSULT NOTE - Initial Consult  Pharmacy Consult for Coumadin Indication: DVT  Allergies  Allergen Reactions  . Amiodarone     Excessive QT prolongation, nausea  . Codeine Nausea And Vomiting  . Penicillins Nausea Only    Patient Measurements: Height: '5\' 3"'$  (160 cm) Weight: 153 lb 3.5 oz (69.5 kg) IBW/kg (Calculated) : 52.4 Heparin Dosing Weight: 70 kg  Vital Signs: Temp: 98.5 F (36.9 C) (11/17 0521) Temp Source: Oral (11/17 0521) BP: 139/52 mmHg (11/17 0521) Pulse Rate: 69 (11/17 0521)  Labs:  Recent Labs  01/13/15 1056  01/13/15 2233 01/14/15 0925 01/15/15 0538 01/16/15 0723  HGB 11.4*  --   --  11.5* 11.0* 11.0*  HCT 36.7  --   --  38.5 36.1 37.0  PLT 248  --   --  250 227 221  APTT >200*  --  177*  --   --   --   LABPROT  --   < >  --  18.0* 15.8* 15.5*  INR  --   < >  --  1.48 1.24 1.22  HEPARINUNFRC  --   --  1.60*  --   --   --   CREATININE 1.09*  --   --   --  1.42*  --   < > = values in this interval not displayed.  Estimated Creatinine Clearance: 25.1 mL/min (by C-G formula based on Cr of 1.42).   Assessment: 81 y.o. female on xarelto PTA for afib, now with new DVT, changed to coumadin with lovenox bridge for anticoagulation. INR 1.22, still subtherapeutic. CBC stable. No bleeding noted per chart. Will increase dose cautiously since pt is also on doxycycline.   Goal of Therapy:  INR 2-3 Monitor platelets by anticoagulation protocol: Yes   Plan:  Coumadin 7.5 mg po x 1 Daily PT/INR Lovenox 70 mg Q 24 hrs (crcl ~ 30 ml/min) D/C lovenox when INR > 2  Maryanna Shape, PharmD, BCPS  Clinical Pharmacist  Pager: [phone]   01/16/2015,8:53 AM

## 2015-01-16 NOTE — Progress Notes (Signed)
Pt prepared for d/c to SNF. IV d/c'd. Skin intact except as charted in most recent assessments. Vitals are stable. Report called to receiving facility. Pt to be transported by nephew to facility.

## 2015-01-27 ENCOUNTER — Ambulatory Visit: Payer: Medicare Other | Admitting: Physician Assistant

## 2015-02-05 ENCOUNTER — Ambulatory Visit: Payer: Medicare Other | Admitting: Physician Assistant

## 2015-02-05 ENCOUNTER — Other Ambulatory Visit: Payer: Self-pay | Admitting: Cardiology

## 2015-02-05 NOTE — Telephone Encounter (Signed)
REFILL 

## 2015-03-05 ENCOUNTER — Other Ambulatory Visit: Payer: Self-pay | Admitting: Cardiology

## 2015-03-05 NOTE — Telephone Encounter (Signed)
Rx request sent to pharmacy.  

## 2015-03-31 ENCOUNTER — Other Ambulatory Visit: Payer: Self-pay | Admitting: Cardiology

## 2015-03-31 NOTE — Telephone Encounter (Signed)
Rx request sent to pharmacy.  

## 2015-04-07 ENCOUNTER — Other Ambulatory Visit: Payer: Self-pay | Admitting: Cardiology

## 2015-04-25 DIAGNOSIS — I4891 Unspecified atrial fibrillation: Secondary | ICD-10-CM | POA: Diagnosis not present

## 2015-04-25 DIAGNOSIS — E559 Vitamin D deficiency, unspecified: Secondary | ICD-10-CM | POA: Diagnosis not present

## 2015-04-25 DIAGNOSIS — Z7901 Long term (current) use of anticoagulants: Secondary | ICD-10-CM | POA: Diagnosis not present

## 2015-04-25 DIAGNOSIS — I10 Essential (primary) hypertension: Secondary | ICD-10-CM | POA: Diagnosis not present

## 2015-04-25 DIAGNOSIS — R8290 Unspecified abnormal findings in urine: Secondary | ICD-10-CM | POA: Diagnosis not present

## 2015-04-25 DIAGNOSIS — R079 Chest pain, unspecified: Secondary | ICD-10-CM | POA: Diagnosis not present

## 2015-04-25 DIAGNOSIS — I82402 Acute embolism and thrombosis of unspecified deep veins of left lower extremity: Secondary | ICD-10-CM | POA: Diagnosis not present

## 2015-04-25 DIAGNOSIS — Z6828 Body mass index (BMI) 28.0-28.9, adult: Secondary | ICD-10-CM | POA: Diagnosis not present

## 2015-05-02 DIAGNOSIS — M549 Dorsalgia, unspecified: Secondary | ICD-10-CM | POA: Diagnosis not present

## 2015-05-02 DIAGNOSIS — I4891 Unspecified atrial fibrillation: Secondary | ICD-10-CM | POA: Diagnosis not present

## 2015-05-02 DIAGNOSIS — G629 Polyneuropathy, unspecified: Secondary | ICD-10-CM | POA: Diagnosis not present

## 2015-05-02 DIAGNOSIS — Z6829 Body mass index (BMI) 29.0-29.9, adult: Secondary | ICD-10-CM | POA: Diagnosis not present

## 2015-05-02 DIAGNOSIS — I509 Heart failure, unspecified: Secondary | ICD-10-CM | POA: Diagnosis not present

## 2015-05-02 DIAGNOSIS — R2689 Other abnormalities of gait and mobility: Secondary | ICD-10-CM | POA: Diagnosis not present

## 2015-05-02 DIAGNOSIS — N183 Chronic kidney disease, stage 3 (moderate): Secondary | ICD-10-CM | POA: Diagnosis not present

## 2015-05-02 DIAGNOSIS — Z0000 Encounter for general adult medical examination without abnormal findings: Secondary | ICD-10-CM | POA: Diagnosis not present

## 2015-05-02 DIAGNOSIS — I82402 Acute embolism and thrombosis of unspecified deep veins of left lower extremity: Secondary | ICD-10-CM | POA: Diagnosis not present

## 2015-05-02 DIAGNOSIS — R609 Edema, unspecified: Secondary | ICD-10-CM | POA: Diagnosis not present

## 2015-05-05 ENCOUNTER — Other Ambulatory Visit: Payer: Self-pay | Admitting: *Deleted

## 2015-05-05 MED ORDER — METOPROLOL SUCCINATE ER 25 MG PO TB24: 25.0000 mg | ORAL_TABLET | Freq: Every day | ORAL | Status: DC

## 2015-06-03 ENCOUNTER — Other Ambulatory Visit: Payer: Self-pay | Admitting: *Deleted

## 2015-06-03 MED ORDER — METOPROLOL SUCCINATE ER 25 MG PO TB24: 25.0000 mg | ORAL_TABLET | Freq: Every day | ORAL | Status: DC

## 2015-06-09 ENCOUNTER — Other Ambulatory Visit: Payer: Self-pay

## 2015-06-09 MED ORDER — POTASSIUM CHLORIDE CRYS ER 20 MEQ PO TBCR: EXTENDED_RELEASE_TABLET | ORAL | Status: DC

## 2015-07-02 DIAGNOSIS — Z7901 Long term (current) use of anticoagulants: Secondary | ICD-10-CM | POA: Diagnosis not present

## 2015-07-02 DIAGNOSIS — I4891 Unspecified atrial fibrillation: Secondary | ICD-10-CM | POA: Diagnosis not present

## 2015-07-02 DIAGNOSIS — I82402 Acute embolism and thrombosis of unspecified deep veins of left lower extremity: Secondary | ICD-10-CM | POA: Diagnosis not present

## 2015-07-07 ENCOUNTER — Other Ambulatory Visit: Payer: Self-pay

## 2015-07-07 MED ORDER — METOPROLOL SUCCINATE ER 25 MG PO TB24: 25.0000 mg | ORAL_TABLET | Freq: Every day | ORAL | Status: DC

## 2015-07-07 NOTE — Telephone Encounter (Signed)
Rx Refill

## 2015-07-10 ENCOUNTER — Other Ambulatory Visit: Payer: Self-pay

## 2015-07-10 MED ORDER — POTASSIUM CHLORIDE CRYS ER 20 MEQ PO TBCR: EXTENDED_RELEASE_TABLET | ORAL | Status: DC

## 2015-08-07 ENCOUNTER — Other Ambulatory Visit: Payer: Self-pay

## 2015-09-03 DIAGNOSIS — I82402 Acute embolism and thrombosis of unspecified deep veins of left lower extremity: Secondary | ICD-10-CM | POA: Diagnosis not present

## 2015-09-03 DIAGNOSIS — Z7901 Long term (current) use of anticoagulants: Secondary | ICD-10-CM | POA: Diagnosis not present

## 2015-09-03 DIAGNOSIS — I4891 Unspecified atrial fibrillation: Secondary | ICD-10-CM | POA: Diagnosis not present

## 2015-09-23 DIAGNOSIS — I82402 Acute embolism and thrombosis of unspecified deep veins of left lower extremity: Secondary | ICD-10-CM | POA: Diagnosis not present

## 2015-09-23 DIAGNOSIS — Z7901 Long term (current) use of anticoagulants: Secondary | ICD-10-CM | POA: Diagnosis not present

## 2015-09-23 DIAGNOSIS — I4891 Unspecified atrial fibrillation: Secondary | ICD-10-CM | POA: Diagnosis not present

## 2015-10-07 ENCOUNTER — Other Ambulatory Visit: Payer: Self-pay | Admitting: *Deleted

## 2015-10-07 MED ORDER — FUROSEMIDE 40 MG PO TABS: 40.0000 mg | ORAL_TABLET | Freq: Every day | ORAL | 0 refills | Status: DC

## 2015-11-10 DIAGNOSIS — I82402 Acute embolism and thrombosis of unspecified deep veins of left lower extremity: Secondary | ICD-10-CM | POA: Diagnosis not present

## 2015-11-10 DIAGNOSIS — G609 Hereditary and idiopathic neuropathy, unspecified: Secondary | ICD-10-CM | POA: Diagnosis not present

## 2015-11-10 DIAGNOSIS — I4892 Unspecified atrial flutter: Secondary | ICD-10-CM | POA: Diagnosis not present

## 2015-11-10 DIAGNOSIS — R609 Edema, unspecified: Secondary | ICD-10-CM | POA: Diagnosis not present

## 2015-11-10 DIAGNOSIS — R2689 Other abnormalities of gait and mobility: Secondary | ICD-10-CM | POA: Diagnosis not present

## 2015-11-10 DIAGNOSIS — Z7901 Long term (current) use of anticoagulants: Secondary | ICD-10-CM | POA: Diagnosis not present

## 2015-11-10 DIAGNOSIS — I4891 Unspecified atrial fibrillation: Secondary | ICD-10-CM | POA: Diagnosis not present

## 2015-11-10 DIAGNOSIS — M549 Dorsalgia, unspecified: Secondary | ICD-10-CM | POA: Diagnosis not present

## 2015-11-10 DIAGNOSIS — I509 Heart failure, unspecified: Secondary | ICD-10-CM | POA: Diagnosis not present

## 2015-11-10 DIAGNOSIS — N183 Chronic kidney disease, stage 3 (moderate): Secondary | ICD-10-CM | POA: Diagnosis not present

## 2016-01-05 DIAGNOSIS — I4891 Unspecified atrial fibrillation: Secondary | ICD-10-CM | POA: Diagnosis not present

## 2016-01-05 DIAGNOSIS — Z7901 Long term (current) use of anticoagulants: Secondary | ICD-10-CM | POA: Diagnosis not present

## 2016-02-12 DIAGNOSIS — I130 Hypertensive heart and chronic kidney disease with heart failure and stage 1 through stage 4 chronic kidney disease, or unspecified chronic kidney disease: Secondary | ICD-10-CM | POA: Diagnosis not present

## 2016-02-12 DIAGNOSIS — G629 Polyneuropathy, unspecified: Secondary | ICD-10-CM | POA: Diagnosis not present

## 2016-02-12 DIAGNOSIS — I509 Heart failure, unspecified: Secondary | ICD-10-CM | POA: Diagnosis not present

## 2016-02-12 DIAGNOSIS — I2510 Atherosclerotic heart disease of native coronary artery without angina pectoris: Secondary | ICD-10-CM | POA: Diagnosis not present

## 2016-02-12 DIAGNOSIS — K219 Gastro-esophageal reflux disease without esophagitis: Secondary | ICD-10-CM | POA: Diagnosis not present

## 2016-02-12 DIAGNOSIS — I4891 Unspecified atrial fibrillation: Secondary | ICD-10-CM | POA: Diagnosis not present

## 2016-02-12 DIAGNOSIS — Z7901 Long term (current) use of anticoagulants: Secondary | ICD-10-CM | POA: Diagnosis not present

## 2016-02-12 DIAGNOSIS — N183 Chronic kidney disease, stage 3 (moderate): Secondary | ICD-10-CM | POA: Diagnosis not present

## 2016-02-12 DIAGNOSIS — Z86718 Personal history of other venous thrombosis and embolism: Secondary | ICD-10-CM | POA: Diagnosis not present

## 2016-04-13 DIAGNOSIS — I2510 Atherosclerotic heart disease of native coronary artery without angina pectoris: Secondary | ICD-10-CM | POA: Diagnosis not present

## 2016-04-13 DIAGNOSIS — Z86718 Personal history of other venous thrombosis and embolism: Secondary | ICD-10-CM | POA: Diagnosis not present

## 2016-04-13 DIAGNOSIS — Z7901 Long term (current) use of anticoagulants: Secondary | ICD-10-CM | POA: Diagnosis not present

## 2016-04-13 DIAGNOSIS — G629 Polyneuropathy, unspecified: Secondary | ICD-10-CM | POA: Diagnosis not present

## 2016-04-13 DIAGNOSIS — I509 Heart failure, unspecified: Secondary | ICD-10-CM | POA: Diagnosis not present

## 2016-04-13 DIAGNOSIS — I130 Hypertensive heart and chronic kidney disease with heart failure and stage 1 through stage 4 chronic kidney disease, or unspecified chronic kidney disease: Secondary | ICD-10-CM | POA: Diagnosis not present

## 2016-04-13 DIAGNOSIS — N183 Chronic kidney disease, stage 3 (moderate): Secondary | ICD-10-CM | POA: Diagnosis not present

## 2016-04-13 DIAGNOSIS — Z5181 Encounter for therapeutic drug level monitoring: Secondary | ICD-10-CM | POA: Diagnosis not present

## 2016-04-13 DIAGNOSIS — K219 Gastro-esophageal reflux disease without esophagitis: Secondary | ICD-10-CM | POA: Diagnosis not present

## 2016-04-13 DIAGNOSIS — I4891 Unspecified atrial fibrillation: Secondary | ICD-10-CM | POA: Diagnosis not present

## 2016-05-20 DIAGNOSIS — I10 Essential (primary) hypertension: Secondary | ICD-10-CM | POA: Diagnosis not present

## 2016-05-20 DIAGNOSIS — I4891 Unspecified atrial fibrillation: Secondary | ICD-10-CM | POA: Diagnosis not present

## 2016-05-20 DIAGNOSIS — E559 Vitamin D deficiency, unspecified: Secondary | ICD-10-CM | POA: Diagnosis not present

## 2016-05-20 DIAGNOSIS — R8290 Unspecified abnormal findings in urine: Secondary | ICD-10-CM | POA: Diagnosis not present

## 2016-05-26 DIAGNOSIS — Z0000 Encounter for general adult medical examination without abnormal findings: Secondary | ICD-10-CM | POA: Diagnosis not present

## 2016-05-26 DIAGNOSIS — N183 Chronic kidney disease, stage 3 (moderate): Secondary | ICD-10-CM | POA: Diagnosis not present

## 2016-05-26 DIAGNOSIS — I509 Heart failure, unspecified: Secondary | ICD-10-CM | POA: Diagnosis not present

## 2016-05-26 DIAGNOSIS — I2510 Atherosclerotic heart disease of native coronary artery without angina pectoris: Secondary | ICD-10-CM | POA: Diagnosis not present

## 2016-06-11 DIAGNOSIS — I509 Heart failure, unspecified: Secondary | ICD-10-CM | POA: Diagnosis not present

## 2016-06-11 DIAGNOSIS — I4891 Unspecified atrial fibrillation: Secondary | ICD-10-CM | POA: Diagnosis not present

## 2016-06-11 DIAGNOSIS — I130 Hypertensive heart and chronic kidney disease with heart failure and stage 1 through stage 4 chronic kidney disease, or unspecified chronic kidney disease: Secondary | ICD-10-CM | POA: Diagnosis not present

## 2016-06-11 DIAGNOSIS — N183 Chronic kidney disease, stage 3 (moderate): Secondary | ICD-10-CM | POA: Diagnosis not present

## 2016-06-23 DIAGNOSIS — Z5181 Encounter for therapeutic drug level monitoring: Secondary | ICD-10-CM | POA: Diagnosis not present

## 2016-06-23 DIAGNOSIS — G629 Polyneuropathy, unspecified: Secondary | ICD-10-CM | POA: Diagnosis not present

## 2016-06-23 DIAGNOSIS — I4891 Unspecified atrial fibrillation: Secondary | ICD-10-CM | POA: Diagnosis not present

## 2016-06-23 DIAGNOSIS — I130 Hypertensive heart and chronic kidney disease with heart failure and stage 1 through stage 4 chronic kidney disease, or unspecified chronic kidney disease: Secondary | ICD-10-CM | POA: Diagnosis not present

## 2016-06-23 DIAGNOSIS — N183 Chronic kidney disease, stage 3 (moderate): Secondary | ICD-10-CM | POA: Diagnosis not present

## 2016-06-23 DIAGNOSIS — K219 Gastro-esophageal reflux disease without esophagitis: Secondary | ICD-10-CM | POA: Diagnosis not present

## 2016-06-23 DIAGNOSIS — Z86718 Personal history of other venous thrombosis and embolism: Secondary | ICD-10-CM | POA: Diagnosis not present

## 2016-06-23 DIAGNOSIS — I509 Heart failure, unspecified: Secondary | ICD-10-CM | POA: Diagnosis not present

## 2016-06-23 DIAGNOSIS — I2510 Atherosclerotic heart disease of native coronary artery without angina pectoris: Secondary | ICD-10-CM | POA: Diagnosis not present

## 2016-06-23 DIAGNOSIS — Z7901 Long term (current) use of anticoagulants: Secondary | ICD-10-CM | POA: Diagnosis not present

## 2016-08-18 DIAGNOSIS — Z7901 Long term (current) use of anticoagulants: Secondary | ICD-10-CM | POA: Diagnosis not present

## 2016-08-18 DIAGNOSIS — I4891 Unspecified atrial fibrillation: Secondary | ICD-10-CM | POA: Diagnosis not present

## 2016-09-15 DIAGNOSIS — N183 Chronic kidney disease, stage 3 (moderate): Secondary | ICD-10-CM | POA: Diagnosis not present

## 2016-09-15 DIAGNOSIS — I4891 Unspecified atrial fibrillation: Secondary | ICD-10-CM | POA: Diagnosis not present

## 2016-09-15 DIAGNOSIS — I10 Essential (primary) hypertension: Secondary | ICD-10-CM | POA: Diagnosis not present

## 2016-09-15 DIAGNOSIS — Z7901 Long term (current) use of anticoagulants: Secondary | ICD-10-CM | POA: Diagnosis not present

## 2016-11-09 ENCOUNTER — Ambulatory Visit (HOSPITAL_BASED_OUTPATIENT_CLINIC_OR_DEPARTMENT_OTHER)
Admission: RE | Admit: 2016-11-09 | Discharge: 2016-11-09 | Disposition: A | Discharge status: Home / Self Care | Payer: Medicare Other | Source: Ambulatory Visit | Attending: Nurse Practitioner | Admitting: Nurse Practitioner

## 2016-11-09 ENCOUNTER — Inpatient Hospital Stay (HOSPITAL_COMMUNITY)
Admission: AD | Admit: 2016-11-09 | Discharge: 2016-11-17 | DRG: 242 | Disposition: A | Discharge status: Skilled Nursing Facility | Payer: Medicare Other | Source: Ambulatory Visit | Attending: Cardiology | Admitting: Cardiology

## 2016-11-09 ENCOUNTER — Inpatient Hospital Stay (HOSPITAL_COMMUNITY): Payer: Medicare Other

## 2016-11-09 ENCOUNTER — Other Ambulatory Visit: Payer: Self-pay

## 2016-11-09 ENCOUNTER — Encounter (HOSPITAL_COMMUNITY): Payer: Self-pay | Admitting: Nurse Practitioner

## 2016-11-09 VITALS — BP 118/84 | HR 153 | Ht 63.0 in | Wt 161.0 lb

## 2016-11-09 DIAGNOSIS — I110 Hypertensive heart disease with heart failure: Secondary | ICD-10-CM | POA: Diagnosis not present

## 2016-11-09 DIAGNOSIS — K219 Gastro-esophageal reflux disease without esophagitis: Secondary | ICD-10-CM | POA: Diagnosis present

## 2016-11-09 DIAGNOSIS — Z885 Allergy status to narcotic agent status: Secondary | ICD-10-CM

## 2016-11-09 DIAGNOSIS — I5042 Chronic combined systolic (congestive) and diastolic (congestive) heart failure: Secondary | ICD-10-CM | POA: Insufficient documentation

## 2016-11-09 DIAGNOSIS — R2689 Other abnormalities of gait and mobility: Secondary | ICD-10-CM | POA: Diagnosis not present

## 2016-11-09 DIAGNOSIS — Z880 Allergy status to penicillin: Secondary | ICD-10-CM

## 2016-11-09 DIAGNOSIS — R5383 Other fatigue: Secondary | ICD-10-CM | POA: Insufficient documentation

## 2016-11-09 DIAGNOSIS — Z87891 Personal history of nicotine dependence: Secondary | ICD-10-CM

## 2016-11-09 DIAGNOSIS — I2510 Atherosclerotic heart disease of native coronary artery without angina pectoris: Secondary | ICD-10-CM

## 2016-11-09 DIAGNOSIS — Z515 Encounter for palliative care: Secondary | ICD-10-CM | POA: Diagnosis not present

## 2016-11-09 DIAGNOSIS — Z9889 Other specified postprocedural states: Secondary | ICD-10-CM | POA: Insufficient documentation

## 2016-11-09 DIAGNOSIS — D649 Anemia, unspecified: Secondary | ICD-10-CM | POA: Diagnosis not present

## 2016-11-09 DIAGNOSIS — I4891 Unspecified atrial fibrillation: Secondary | ICD-10-CM

## 2016-11-09 DIAGNOSIS — Z7189 Other specified counseling: Secondary | ICD-10-CM

## 2016-11-09 DIAGNOSIS — R2681 Unsteadiness on feet: Secondary | ICD-10-CM | POA: Diagnosis not present

## 2016-11-09 DIAGNOSIS — I5043 Acute on chronic combined systolic (congestive) and diastolic (congestive) heart failure: Secondary | ICD-10-CM | POA: Diagnosis not present

## 2016-11-09 DIAGNOSIS — I481 Persistent atrial fibrillation: Secondary | ICD-10-CM

## 2016-11-09 DIAGNOSIS — I82409 Acute embolism and thrombosis of unspecified deep veins of unspecified lower extremity: Secondary | ICD-10-CM | POA: Diagnosis not present

## 2016-11-09 DIAGNOSIS — G629 Polyneuropathy, unspecified: Secondary | ICD-10-CM | POA: Diagnosis not present

## 2016-11-09 DIAGNOSIS — Z888 Allergy status to other drugs, medicaments and biological substances status: Secondary | ICD-10-CM | POA: Insufficient documentation

## 2016-11-09 DIAGNOSIS — M6281 Muscle weakness (generalized): Secondary | ICD-10-CM | POA: Diagnosis not present

## 2016-11-09 DIAGNOSIS — I495 Sick sinus syndrome: Secondary | ICD-10-CM | POA: Diagnosis not present

## 2016-11-09 DIAGNOSIS — I4819 Other persistent atrial fibrillation: Secondary | ICD-10-CM

## 2016-11-09 DIAGNOSIS — M7989 Other specified soft tissue disorders: Secondary | ICD-10-CM | POA: Insufficient documentation

## 2016-11-09 DIAGNOSIS — Z79899 Other long term (current) drug therapy: Secondary | ICD-10-CM

## 2016-11-09 DIAGNOSIS — Z7901 Long term (current) use of anticoagulants: Secondary | ICD-10-CM

## 2016-11-09 DIAGNOSIS — Z90710 Acquired absence of both cervix and uterus: Secondary | ICD-10-CM

## 2016-11-09 DIAGNOSIS — I4892 Unspecified atrial flutter: Secondary | ICD-10-CM | POA: Diagnosis not present

## 2016-11-09 DIAGNOSIS — R410 Disorientation, unspecified: Secondary | ICD-10-CM | POA: Diagnosis not present

## 2016-11-09 DIAGNOSIS — I340 Nonrheumatic mitral (valve) insufficiency: Secondary | ICD-10-CM | POA: Diagnosis not present

## 2016-11-09 DIAGNOSIS — I509 Heart failure, unspecified: Secondary | ICD-10-CM | POA: Diagnosis not present

## 2016-11-09 DIAGNOSIS — M479 Spondylosis, unspecified: Secondary | ICD-10-CM | POA: Diagnosis present

## 2016-11-09 DIAGNOSIS — I10 Essential (primary) hypertension: Secondary | ICD-10-CM | POA: Diagnosis not present

## 2016-11-09 DIAGNOSIS — D72829 Elevated white blood cell count, unspecified: Secondary | ICD-10-CM | POA: Diagnosis present

## 2016-11-09 DIAGNOSIS — R278 Other lack of coordination: Secondary | ICD-10-CM | POA: Diagnosis not present

## 2016-11-09 DIAGNOSIS — I517 Cardiomegaly: Secondary | ICD-10-CM | POA: Diagnosis not present

## 2016-11-09 DIAGNOSIS — Z951 Presence of aortocoronary bypass graft: Secondary | ICD-10-CM

## 2016-11-09 DIAGNOSIS — N390 Urinary tract infection, site not specified: Secondary | ICD-10-CM | POA: Diagnosis not present

## 2016-11-09 DIAGNOSIS — I959 Hypotension, unspecified: Secondary | ICD-10-CM | POA: Diagnosis present

## 2016-11-09 DIAGNOSIS — I480 Paroxysmal atrial fibrillation: Secondary | ICD-10-CM | POA: Diagnosis not present

## 2016-11-09 DIAGNOSIS — N289 Disorder of kidney and ureter, unspecified: Secondary | ICD-10-CM | POA: Diagnosis not present

## 2016-11-09 DIAGNOSIS — M1990 Unspecified osteoarthritis, unspecified site: Secondary | ICD-10-CM | POA: Diagnosis not present

## 2016-11-09 DIAGNOSIS — E785 Hyperlipidemia, unspecified: Secondary | ICD-10-CM | POA: Insufficient documentation

## 2016-11-09 DIAGNOSIS — I5032 Chronic diastolic (congestive) heart failure: Secondary | ICD-10-CM | POA: Diagnosis not present

## 2016-11-09 DIAGNOSIS — Z8249 Family history of ischemic heart disease and other diseases of the circulatory system: Secondary | ICD-10-CM | POA: Insufficient documentation

## 2016-11-09 DIAGNOSIS — R41841 Cognitive communication deficit: Secondary | ICD-10-CM | POA: Diagnosis not present

## 2016-11-09 DIAGNOSIS — I5033 Acute on chronic diastolic (congestive) heart failure: Secondary | ICD-10-CM | POA: Diagnosis not present

## 2016-11-09 DIAGNOSIS — I739 Peripheral vascular disease, unspecified: Secondary | ICD-10-CM | POA: Diagnosis not present

## 2016-11-09 DIAGNOSIS — Z5181 Encounter for therapeutic drug level monitoring: Secondary | ICD-10-CM | POA: Diagnosis not present

## 2016-11-09 DIAGNOSIS — Z95818 Presence of other cardiac implants and grafts: Secondary | ICD-10-CM

## 2016-11-09 LAB — BASIC METABOLIC PANEL
ANION GAP: 10 (ref 5–15)
BUN: 15 mg/dL (ref 6–20)
CALCIUM: 9.4 mg/dL (ref 8.9–10.3)
CHLORIDE: 101 mmol/L (ref 101–111)
CO2: 24 mmol/L (ref 22–32)
CREATININE: 1.46 mg/dL — AB (ref 0.44–1.00)
GFR calc non Af Amer: 30 mL/min — ABNORMAL LOW (ref >60)
GFR, EST AFRICAN AMERICAN: 35 mL/min — AB (ref >60)
Glucose, Bld: 124 mg/dL — ABNORMAL HIGH (ref 65–99)
Potassium: 3.9 mmol/L (ref 3.5–5.1)
SODIUM: 135 mmol/L (ref 135–145)

## 2016-11-09 LAB — CBC
HEMATOCRIT: 46.8 % — AB (ref 36.0–46.0)
HEMOGLOBIN: 14.9 g/dL (ref 12.0–15.0)
MCH: 29.6 pg (ref 26.0–34.0)
MCHC: 31.8 g/dL (ref 30.0–36.0)
MCV: 93 fL (ref 78.0–100.0)
Platelets: 240 10*3/uL (ref 150–400)
RBC: 5.03 MIL/uL (ref 3.87–5.11)
RDW: 16.3 % — ABNORMAL HIGH (ref 11.5–15.5)
WBC: 11.1 10*3/uL — AB (ref 4.0–10.5)

## 2016-11-09 LAB — URINALYSIS, ROUTINE W REFLEX MICROSCOPIC
BACTERIA UA: NONE SEEN
Bilirubin Urine: NEGATIVE
GLUCOSE, UA: NEGATIVE mg/dL
KETONES UR: NEGATIVE mg/dL
LEUKOCYTES UA: NEGATIVE
Nitrite: NEGATIVE
PROTEIN: 30 mg/dL — AB
Specific Gravity, Urine: 1.018 (ref 1.005–1.030)
pH: 5 (ref 5.0–8.0)

## 2016-11-09 LAB — TSH: TSH: 4.192 u[IU]/mL (ref 0.350–4.500)

## 2016-11-09 MED ORDER — NITROGLYCERIN 0.4 MG SL SUBL: 0.4000 mg | SUBLINGUAL_TABLET | SUBLINGUAL | Status: DC | PRN

## 2016-11-09 MED ORDER — ACETAMINOPHEN 325 MG PO TABS: 650.0000 mg | ORAL_TABLET | Freq: Four times a day (QID) | ORAL | Status: DC | PRN

## 2016-11-09 MED ORDER — FUROSEMIDE 10 MG/ML IJ SOLN
40.0000 mg | Freq: Two times a day (BID) | INTRAMUSCULAR | Status: DC
  Administered 2016-11-09: 40 mg via INTRAVENOUS
  Filled 2016-11-09: qty 4

## 2016-11-09 MED ORDER — DILTIAZEM HCL 100 MG IV SOLR
5.0000 mg/h | INTRAVENOUS | Status: DC
  Filled 2016-11-09: qty 100

## 2016-11-09 MED ORDER — MEMANTINE HCL 10 MG PO TABS
10.0000 mg | ORAL_TABLET | Freq: Every day | ORAL | Status: DC
  Administered 2016-11-10 – 2016-11-17 (×8): 10 mg via ORAL
  Filled 2016-11-09 (×9): qty 1

## 2016-11-09 MED ORDER — DILTIAZEM HCL 100 MG IV SOLR
5.0000 mg/h | INTRAVENOUS | Status: DC
  Administered 2016-11-09: 5 mg/h via INTRAVENOUS
  Administered 2016-11-10: 15 mg/h via INTRAVENOUS
  Administered 2016-11-10: 10 mg/h via INTRAVENOUS
  Administered 2016-11-10: 17.5 mg/h via INTRAVENOUS
  Administered 2016-11-10: 10 mg/h via INTRAVENOUS
  Filled 2016-11-09 (×5): qty 100

## 2016-11-09 MED ORDER — ONDANSETRON HCL 4 MG/2ML IJ SOLN: 4.0000 mg | Freq: Four times a day (QID) | INTRAMUSCULAR | Status: DC | PRN

## 2016-11-09 MED ORDER — DILTIAZEM LOAD VIA INFUSION: 5.0000 mg | Freq: Once | INTRAVENOUS | Status: DC

## 2016-11-09 MED ORDER — DILTIAZEM LOAD VIA INFUSION
10.0000 mg | Freq: Once | INTRAVENOUS | Status: DC
  Filled 2016-11-09: qty 10

## 2016-11-09 MED ORDER — DILTIAZEM HCL 100 MG IV SOLR: 5.0000 mg/h | INTRAVENOUS | Status: DC

## 2016-11-09 MED ORDER — METOPROLOL SUCCINATE ER 50 MG PO TB24: 50.0000 mg | ORAL_TABLET | Freq: Every day | ORAL | Status: DC

## 2016-11-09 MED ORDER — DILTIAZEM LOAD VIA INFUSION
10.0000 mg | Freq: Once | INTRAVENOUS | Status: AC
Start: 2016-11-09 — End: 2016-11-09
  Administered 2016-11-09: 10 mg via INTRAVENOUS

## 2016-11-09 MED ORDER — RIVAROXABAN 15 MG PO TABS
15.0000 mg | ORAL_TABLET | Freq: Every day | ORAL | Status: DC
  Administered 2016-11-09 – 2016-11-16 (×7): 15 mg via ORAL
  Filled 2016-11-09 (×9): qty 1

## 2016-11-09 NOTE — H&P (Addendum)
H&P   Patient ID: Cheryl Zamora; [id number]; Jan 05, 1924   Admit date: 11/09/2016 Date of Admit: 11/09/2016  Primary Care Provider: Burnard Bunting, MD Primary Cardiologist: new to [facility], Dr. Curt Bears   Patient Profile:   Cheryl Zamora is a 83 y.o. female with a hx of known PAFib with DCCV historically, CAD (CABG), HTN,  who is being admitted with fluid OL and rapid AF.  History of Present Illness:   Cheryl Zamora was referred to the AFib clinic via her PMD for evaluation and management of her AF.  She was found to have clinical findings of fluid OL and in rapid AF with 4 days of progressive DOE, and fatigue.  Was recommended in-patient management of both was needed the patient and her nephew were in agreement.  The patient denies any heart awareness, no palpitations, no CP.  She has had 4 days of increasing LE swelling and DOE, as well as generalized fatigue.  No near syncope or syncope.  LABS K+ 3.9 BUN/Creat 15/1.46 (up from 2016) WBC 11.1 H/H 14/46 Plts 240 TSH 4.192  Past Medical History:  Diagnosis Date  . Atrial fibrillation with rapid ventricular response ([place]) 11/2012; 10/2013   a. s/p TEE/DCCV 11/2012 & 10/2013 b. amiodarone caused significant QT prlongation (>637msec)  . Chronic combined systolic and diastolic CHF (congestive heart failure) ([place])   . Coronary artery disease 2005   a. s/p CABG (2012)  . GERD (gastroesophageal reflux disease)   . Hyperlipidemia    pt denies this hx on 01/03/2015  . Hypertension   . Neuropathy   . Osteoarthritis of back     Past Surgical History:  Procedure Laterality Date  . ABDOMINAL HYSTERECTOMY    . BACK SURGERY    . CARDIAC CATHETERIZATION  04/12/2003   LMain 60, LAD 90, D1 30, CFX 95->T, RCA OK  . CARDIOVERSION N/A 12/13/2012   Procedure: CARDIOVERSION;  Surgeon: Larey Dresser, MD;  Location: [facility] ENDOSCOPY;  Service: Cardiovascular;  Laterality: N/A;  . CARDIOVERSION N/A 11/13/2013   Procedure: CARDIOVERSION;  Surgeon:  Laurna Spark, MD;  Location: [facility] ENDOSCOPY;  Service: Cardiovascular;  Laterality: N/A;  . CARDIOVERSION N/A 04/12/2014   Procedure: CARDIOVERSION;  Surgeon: Thayer Headings, MD;  Location: [facility] ENDOSCOPY;  Service: Cardiovascular;  Laterality: N/A;  . CARDIOVERSION N/A 01/06/2015   Procedure: CARDIOVERSION;  Surgeon: Larey Dresser, MD;  Location: [facility] ENDOSCOPY;  Service: Cardiovascular;  Laterality: N/A;  . [place]  1960's   "ruptured disc'  . CORONARY ANGIOPLASTY    . CORONARY ARTERY BYPASS GRAFT  04/18/2003   x 4; LIMA-LAD, SVG-D1, SVG-OM1-OM2  . TEE WITHOUT CARDIOVERSION N/A 12/13/2012   Procedure: TRANSESOPHAGEAL ECHOCARDIOGRAM (TEE);  Surgeon: Larey Dresser, MD;  Location: [facility] ENDOSCOPY;  Service: Cardiovascular;  Laterality: N/A;  . TEE WITHOUT CARDIOVERSION N/A 11/13/2013   Procedure: TRANSESOPHAGEAL ECHOCARDIOGRAM (TEE);  Surgeon: Seila Spark, MD;  Location: [facility] ENDOSCOPY;  Service: Cardiovascular;  Laterality: N/A;  . TONSILLECTOMY         Inpatient Medications: Scheduled Meds:  Continuous Infusions:  PRN Meds:   Allergies:    Allergies  Allergen Reactions  . Digoxin And Related     Increased HR  . Amiodarone     Excessive QT prolongation, nausea  . Codeine Nausea And Vomiting  . Penicillins Nausea Only    Social History:   Social History   Social History  . Marital status: Single    Spouse name: N/A  . Number  of children: N/A  . Years of education: N/A   Occupational History  . Retired    Social History Main Topics  . Smoking status: Former Smoker    Packs/day: 1.00    Years: 60.00    Types: Cigarettes  . Smokeless tobacco: Never Used  . Alcohol use Yes     Comment: 01/03/2015 "might have a beer a couple times/yr"  . Drug use: No  . Sexual activity: No   Other Topics Concern  . Not on file   Social History Narrative   Pt son helps with her care.    Family History:   Family History  Problem Relation Age of Onset  .  Heart attack Mother   . Heart attack Father   . Heart attack Brother   . Heart attack Brother   . Heart attack Brother   . Heart attack Brother      ROS:  Please see the history of present illness.  ROS  All other ROS reviewed and negative.     Physical Exam/Data:   General:  Well nourished, well developed, in no acute distress HEENT: normal Lymph: no adenopathy Neck: no JVD Endocrine:  No thryomegaly Vascular: No carotid bruits Cardiac:  RRR, tachycardic; no murmurs, gallops or rubs Lungs:  Soft crackles at the bases, no wheezing, rhonchi or rales  Abd: soft, nontender, no hepatomegaly  Ext: no edema Musculoskeletal:  No deformities, BUE and BLE strength normal and equal Skin: warm and dry  Neuro:  no focal abnormalities noted Psych:  Normal affect   EKG:  The EKG was personally reviewed and demonstrates:   AFlutter/rapid AFib 154bpm Telemetry:  Telemetry was personally reviewed and demonstrates:   AFib 120's-140's  Relevant CV Studies:  04/12/14: TTE Study Conclusions - Left ventricle: The cavity size was normal. Wall thickness was increased in a pattern of mild LVH. There was mild focal basal hypertrophy of the septum. Systolic function was mildly reduced. The estimated ejection fraction was in the range of 45% to 50%. Diffuse hypokinesis. - Mitral valve: There was mild regurgitation. - Left atrium: The atrium was mildly dilated. - Right ventricle: The cavity size was severely dilated. - Right atrium: The atrium was severely dilated. - Atrial septum: No defect or patent foramen ovale was identified. - Tricuspid valve: There was moderate regurgitation. - Pulmonary arteries: PA peak pressure: 33 mm Hg (S).  Laboratory Data:  Chemistry  Recent Labs Lab 11/09/16 0943  NA 135  K 3.9  CL 101  CO2 24  GLUCOSE 124*  BUN 15  CREATININE 1.46*  CALCIUM 9.4  GFRNONAA 30*  GFRAA 35*  ANIONGAP 10    No results for input(s): PROT, ALBUMIN, AST, ALT,  ALKPHOS, BILITOT in the last 168 hours. Hematology  Recent Labs Lab 11/09/16 0943  WBC 11.1*  RBC 5.03  HGB 14.9  HCT 46.8*  MCV 93.0  MCH 29.6  MCHC 31.8  RDW 16.3*  PLT 240   Cardiac EnzymesNo results for input(s): TROPONINI in the last 168 hours. No results for input(s): TROPIPOC in the last 168 hours.  BNPNo results for input(s): BNP, PROBNP in the last 168 hours.  DDimer No results for input(s): DDIMER in the last 168 hours.  Radiology/Studies:  No results found.  Assessment and Plan:   1. Rapid Afib likely persistent     CHA2DS2Vasc is 5 on Xarelto, appropriately doses at 15mg  daily (cal Cr Cl is 29)     Unfortunately she missed her xarelto last Friday  Mannie Ohlin start cardizem gtt for rate control     Nephew reports she has failed/intolerant of digoxin and amiodarone with poor side effects     Cheryl Zamora plan TEE guided DCCV at next available slot  2. CHF     Last known LVEF was 45-50%     Rapid AF likely provoked     IV lasix, CXR, follow renal function  3. HTN     Follow  4. Mild leukocytosis     Cheryl Zamora check UA   For questions or updates, please contact [place] Please consult [email or web address] for contact info under Cardiology/STEMI.   Signed, Cheryl Jamaica, PA-C  11/09/2016 3:18 PM  I have seen and examined this patient with Cheryl Zamora.  Agree with above, note added to reflect my findings.  On exam, iRRR, no murmurs, crackles in the lungs, 2+ edema. Presented to the hospital from AF clinic with RVR and weight gain of 7 lbs. Cheryl Zamora need TEE/CV as missed doses of Eliquis. Plan for diltiazem for rate control and lasix for volume management. Cheryl Zamora plan for TEE/CV at soonest inpatient time slot.    Cheryl Edmiston M. Alesandra Smart MD 11/09/2016 7:55 PM

## 2016-11-09 NOTE — Progress Notes (Signed)
Primary Care Physician: Burnard Bunting, MD Referring Physician: Same   Cheryl Zamora is a 83 y.o. female with a h/o PAF, last episode  2016 at time of hospitalization for sepsis 2/2 UTI and subsequent  DVT/LLE despite being on Xarelto. Pt is new to me and  is brought to the afib clinic, by her nephew who is her POA, for pt having a fast heart rate x 4 days. Pt is alert, non toxic appearing but has noted increase swelling of LE's and fatigue over this period of time. She does live by herself but family is very close by and sees her daily. The nephew reports that she has required cardioversion in the past. She has been tried on digoxin and amiodarone in the past with untoward side effects.Her weight is up 7 lbs over the last few days. She is on lasix 40 mg daily. She denies any PND, orthopnea. She is sleeping well. She did miss one dose of xarelto one week ago.  Today, she denies symptoms of palpitations, chest pain, shortness of breath, orthopnea, PND, lower extremity edema, dizziness, presyncope, syncope, or neurologic sequela. The patient is tolerating medications without difficulties and is otherwise without complaint today.   Past Medical History:  Diagnosis Date  . Atrial fibrillation with rapid ventricular response ([place]) 11/2012; 10/2013   a. s/p TEE/DCCV 11/2012 & 10/2013 b. amiodarone caused significant QT prlongation (>628msec)  . Chronic combined systolic and diastolic CHF (congestive heart failure) ([place])   . Coronary artery disease 2005   a. s/p CABG (2012)  . GERD (gastroesophageal reflux disease)   . Hyperlipidemia    pt denies this hx on 01/03/2015  . Hypertension   . Neuropathy   . Osteoarthritis of back    Past Surgical History:  Procedure Laterality Date  . ABDOMINAL HYSTERECTOMY    . BACK SURGERY    . CARDIAC CATHETERIZATION  04/12/2003   LMain 60, LAD 90, D1 30, CFX 95->T, RCA OK  . CARDIOVERSION N/A 12/13/2012   Procedure: CARDIOVERSION;  Surgeon: Larey Dresser,  MD;  Location: [facility] ENDOSCOPY;  Service: Cardiovascular;  Laterality: N/A;  . CARDIOVERSION N/A 11/13/2013   Procedure: CARDIOVERSION;  Surgeon: Katye Spark, MD;  Location: [facility] ENDOSCOPY;  Service: Cardiovascular;  Laterality: N/A;  . CARDIOVERSION N/A 04/12/2014   Procedure: CARDIOVERSION;  Surgeon: Thayer Headings, MD;  Location: [facility] ENDOSCOPY;  Service: Cardiovascular;  Laterality: N/A;  . CARDIOVERSION N/A 01/06/2015   Procedure: CARDIOVERSION;  Surgeon: Larey Dresser, MD;  Location: [facility] ENDOSCOPY;  Service: Cardiovascular;  Laterality: N/A;  . [place]  1960's   "ruptured disc'  . CORONARY ANGIOPLASTY    . CORONARY ARTERY BYPASS GRAFT  04/18/2003   x 4; LIMA-LAD, SVG-D1, SVG-OM1-OM2  . TEE WITHOUT CARDIOVERSION N/A 12/13/2012   Procedure: TRANSESOPHAGEAL ECHOCARDIOGRAM (TEE);  Surgeon: Larey Dresser, MD;  Location: [facility] ENDOSCOPY;  Service: Cardiovascular;  Laterality: N/A;  . TEE WITHOUT CARDIOVERSION N/A 11/13/2013   Procedure: TRANSESOPHAGEAL ECHOCARDIOGRAM (TEE);  Surgeon: Annya Spark, MD;  Location: [place];  Service: Cardiovascular;  Laterality: N/A;  . TONSILLECTOMY      Current Outpatient Prescriptions  Medication Sig Dispense Refill  . acetaminophen (TYLENOL) 325 MG tablet Take 650 mg by mouth every 6 (six) hours as needed for mild pain.    . furosemide (LASIX) 40 MG tablet Take 1 tablet (40 mg total) by mouth daily. NEED OV. 30 tablet 0  . memantine (NAMENDA) 10 MG tablet Take 10 mg by  mouth daily.     . metoprolol succinate (TOPROL-XL) 25 MG 24 hr tablet Take 1 tablet (25 mg total) by mouth daily. Please schedule appointment for refills. 30 tablet 0  . metoprolol succinate (TOPROL-XL) 50 MG 24 hr tablet 1 tablet daily.  2  . potassium chloride SA (K-DUR,KLOR-CON) 20 MEQ tablet TAKE 1 TABLET(20 MEQ) BY MOUTH DAILY 7 tablet 0  . XARELTO 15 MG TABS tablet 1 tablet daily.  5   No current facility-administered medications for this encounter.      Allergies  Allergen Reactions  . Digoxin And Related     Increased HR  . Amiodarone     Excessive QT prolongation, nausea  . Codeine Nausea And Vomiting  . Penicillins Nausea Only    Social History   Social History  . Marital status: Single    Spouse name: N/A  . Number of children: N/A  . Years of education: N/A   Occupational History  . Retired    Social History Main Topics  . Smoking status: Former Smoker    Packs/day: 1.00    Years: 60.00    Types: Cigarettes  . Smokeless tobacco: Never Used  . Alcohol use Yes     Comment: 01/03/2015 "might have a beer a couple times/yr"  . Drug use: No  . Sexual activity: No   Other Topics Concern  . Not on file   Social History Narrative   Pt son helps with her care.    Family History  Problem Relation Age of Onset  . Heart attack Mother   . Heart attack Father   . Heart attack Brother   . Heart attack Brother   . Heart attack Brother   . Heart attack Brother     ROS- All systems are reviewed and negative except as per the HPI above  Physical Exam: Vitals:   11/09/16 0858  BP: 118/84  Pulse: (!) 153  Weight: 161 lb (73 kg)  Height: 5\' 3"  (1.6 m)   Wt Readings from Last 3 Encounters:  11/09/16 161 lb (73 kg)  01/16/15 153 lb 3.5 oz (69.5 kg)  01/08/15 164 lb (74.4 kg)    Labs: Lab Results  Component Value Date   NA 139 01/16/2015   K 3.9 01/16/2015   CL 103 01/16/2015   CO2 28 01/16/2015   GLUCOSE 100 (H) 01/16/2015   BUN 16 01/16/2015   CREATININE 1.08 (H) 01/16/2015   CALCIUM 9.2 01/16/2015   MG 2.1 01/16/2015   Lab Results  Component Value Date   INR 1.22 01/16/2015   No results found for: CHOL, HDL, LDLCALC, TRIG   GEN- The patient is well appearing, alert and oriented x 3 today, HOH.   Head- normocephalic, atraumatic Eyes-  Sclera clear, conjunctiva pink Ears- hearing intact Oropharynx- clear Neck- supple, no JVP Lymph- no cervical lymphadenopathy Lungs- Scattered crackles  bilaterally, normal work of breathing Heart-irregular rate and rhythm, no murmurs, rubs or gallops, PMI not laterally displaced GI- soft, NT, ND, + BS Extremities- no clubbing, cyanosis, or 2-3+ edema MS- no significant deformity or atrophy Skin- no rash or lesion Psych- euthymic mood, full affect Neuro- strength and sensation are intact  EKG- Afib vrs aflutter  with RVR at 153 bpm, qrs int 84 bpm, Qtc 399 ms Epic records reviewed from last admission 2016    Assessment and Plan: 1. afib /flutter with RVR Pt is showing decompensation with 7 lb weight gain over the last 4 days She did miss  one dose of eliquis one week ago so urgent cardioversion in the ER is not feasible, pt will need TEE guided cardioversion I cannot get an outpt cardioversion before Friday Pt needs diuresis but I am fearful of pulling fluid off with her going so fast, may contribute to hypotension and further decompensation I think it would also be hard to try additional rate control again for fear of hypotension, advanced age and living alone  I feel she needs hospitalization, discussed with Dr. Curt Bears and he agrees  However, the hospital is full and cannot get a bed assignment at this time Pt was given option to go to ER or to go home and come back early afternoon when bed available The nephew chose to go home and bring her back when bed available this afternoon  Pt knows to keep cell phone close by to get bed assignment when available later today  Butch Penny C. Markisha Meding, [facility] [address] [place], [place] [address] [phone]

## 2016-11-09 NOTE — Progress Notes (Addendum)
Patient given diltiazem bolus 10mg  per order.  BP soft.  Nurse paged PA twice without call back.  Cardmaster notified.  Stated to page  Dr Rayann Heman.  Dr Rayann Heman called nurse back and stated nurse should page Dr Curt Bears.  Nurse will follow up with Dr Curt Bears. Marcille Blanco, RN     Nurse paged on call cardiology.  Diltiazem drip started at 1715 at 4ml/hr.  On call NP to be notified if BP drops below 57BAQV systolic. Marcille Blanco, RN

## 2016-11-10 DIAGNOSIS — I4892 Unspecified atrial flutter: Principal | ICD-10-CM

## 2016-11-10 LAB — CBC
HCT: 42.3 % (ref 36.0–46.0)
HEMOGLOBIN: 13.2 g/dL (ref 12.0–15.0)
MCH: 28.9 pg (ref 26.0–34.0)
MCHC: 31.2 g/dL (ref 30.0–36.0)
MCV: 92.6 fL (ref 78.0–100.0)
PLATELETS: 203 10*3/uL (ref 150–400)
RBC: 4.57 MIL/uL (ref 3.87–5.11)
RDW: 16.2 % — ABNORMAL HIGH (ref 11.5–15.5)
WBC: 9 10*3/uL (ref 4.0–10.5)

## 2016-11-10 LAB — BASIC METABOLIC PANEL
ANION GAP: 5 (ref 5–15)
BUN: 13 mg/dL (ref 6–20)
CO2: 27 mmol/L (ref 22–32)
CREATININE: 1.3 mg/dL — AB (ref 0.44–1.00)
Calcium: 8.7 mg/dL — ABNORMAL LOW (ref 8.9–10.3)
Chloride: 104 mmol/L (ref 101–111)
GFR calc non Af Amer: 35 mL/min — ABNORMAL LOW (ref >60)
GFR, EST AFRICAN AMERICAN: 40 mL/min — AB (ref >60)
Glucose, Bld: 122 mg/dL — ABNORMAL HIGH (ref 65–99)
Potassium: 3.8 mmol/L (ref 3.5–5.1)
SODIUM: 136 mmol/L (ref 135–145)

## 2016-11-10 LAB — GLUCOSE, CAPILLARY: GLUCOSE-CAPILLARY: 154 mg/dL — AB (ref 65–99)

## 2016-11-10 MED ORDER — SODIUM CHLORIDE 0.9 % IV SOLN: 250.0000 mL | INTRAVENOUS | Status: DC

## 2016-11-10 MED ORDER — FUROSEMIDE 10 MG/ML IJ SOLN
40.0000 mg | Freq: Two times a day (BID) | INTRAMUSCULAR | Status: DC
  Administered 2016-11-10 – 2016-11-17 (×14): 40 mg via INTRAVENOUS
  Filled 2016-11-10 (×16): qty 4

## 2016-11-10 MED ORDER — SODIUM CHLORIDE 0.9% FLUSH: 3.0000 mL | INTRAVENOUS | Status: DC | PRN

## 2016-11-10 MED ORDER — SODIUM CHLORIDE 0.9% FLUSH
3.0000 mL | Freq: Two times a day (BID) | INTRAVENOUS | Status: DC
  Administered 2016-11-11 – 2016-11-17 (×9): 3 mL via INTRAVENOUS

## 2016-11-10 MED ORDER — METOPROLOL SUCCINATE ER 25 MG PO TB24
25.0000 mg | ORAL_TABLET | Freq: Every day | ORAL | Status: DC
  Administered 2016-11-10 – 2016-11-13 (×4): 25 mg via ORAL
  Filled 2016-11-10 (×4): qty 1

## 2016-11-10 NOTE — Progress Notes (Signed)
Pt is alert and oriented HR-127 increased Cardizem drip by 5 per protocol.  CP PA  Plan to add metoprolol and TEE today.

## 2016-11-10 NOTE — Progress Notes (Signed)
Progress Note  Patient Name: Cheryl Zamora Date of Encounter: 11/10/2016  Primary Cardiologist: new to [facility], Dr. Curt Bears  Subjective   Lying nearly flat/supine, comfortably, no CP, palpitations, no rest SOB  Inpatient Medications    Scheduled Meds: . furosemide  40 mg Intravenous Q12H  . memantine  10 mg Oral Daily  . Rivaroxaban  15 mg Oral Q supper   Continuous Infusions: . diltiazem (CARDIZEM) infusion 12.5 mg/hr (11/10/16 0808)   PRN Meds: acetaminophen, nitroGLYCERIN, ondansetron (ZOFRAN) IV   Vital Signs    Vitals:   11/09/16 1824 11/09/16 1937 11/10/16 0025 11/10/16 0705  BP: 114/84 100/61 (!) 104/58 (!) 110/55  Pulse: (!) 133 100 80 71  Resp:  17 18   Temp:  98.1 F (36.7 C) 97.7 F (36.5 C) 98.2 F (36.8 C)  TempSrc:  Oral Oral Oral  SpO2:  97% 96% 97%  Weight:    158 lb 11.2 oz (72 kg)  Height:        Intake/Output Summary (Last 24 hours) at 11/10/16 0817 Last data filed at 11/10/16 0630  Gross per 24 hour  Intake              340 ml  Output             1021 ml  Net             -681 ml   Filed Weights   11/09/16 1520 11/10/16 0705  Weight: 160 lb 4.8 oz (72.7 kg) 158 lb 11.2 oz (72 kg)    Telemetry    AFlutter, 70's-120's - Personally Reviewed  ECG    Mo new studies  Physical Exam   GEN: No acute distress.   Neck: No JVD Cardiac: IRRR, tachycardic, no murmurs, rubs, or gallops.  Respiratory:fine crackles at the bases remain b/l. GI: Soft, nontender, non-distended  MS: 1-2+ edema; No deformity. Neuro:  Nonfocal  Psych: Normal affect   Labs    Chemistry Recent Labs Lab 11/09/16 0943 11/10/16 0500  NA 135 136  K 3.9 3.8  CL 101 104  CO2 24 27  GLUCOSE 124* 122*  BUN 15 13  CREATININE 1.46* 1.30*  CALCIUM 9.4 8.7*  GFRNONAA 30* 35*  GFRAA 35* 40*  ANIONGAP 10 5     Hematology Recent Labs Lab 11/09/16 0943  WBC 11.1*  RBC 5.03  HGB 14.9  HCT 46.8*  MCV 93.0  MCH 29.6  MCHC 31.8  RDW 16.3*  PLT 240     Cardiac EnzymesNo results for input(s): TROPONINI in the last 168 hours. No results for input(s): TROPIPOC in the last 168 hours.   BNPNo results for input(s): BNP, PROBNP in the last 168 hours.   DDimer No results for input(s): DDIMER in the last 168 hours.   Radiology    Dg Chest Port 1 View Result Date: 11/09/2016 CLINICAL DATA:  CHF. EXAM: PORTABLE CHEST 1 VIEW COMPARISON:  January 02, 2015. FINDINGS: Postsurgical changes related to prior CABG. Stable cardiomegaly. Atherosclerotic calcification of the aortic arch. Normal pulmonary vascularity. Bibasilar atelectasis. No focal consolidation, pleural effusion, or pneumothorax. IMPRESSION: Stable cardiomegaly.  No active disease. Electronically Signed   By: Titus Dubin M.D.   On: 11/09/2016 16:22    Cardiac Studies   No new studies  Patient Profile     83 y.o. female with a hx of known PAFib with DCCV historically, CAD (CABG), HTN, admitted with AFib RVR and fluid OL  Assessment & Plan  1. Rapid Afib     CHA2DS2Vasc is 5 on Xarelto, appropriately doses at 15mg  daily (cal Cr Cl is 29)     Unfortunately she missed her xarelto last Friday     On cardizem gtt for rate control, overnight with sleep rate was much better back in 120's this Am, will add her Toprol today with improve BP this AM     Nephew reports she has failed/intolerant of digoxin and amiodarone with poor side effects     Will plan TEE guided DCCV at next available slot, as of now, looks like Friday unfortunately  2. CHF     Last known LVEF was 45-50%     Rapid AF likely provoked     CXR is unrevealing, exam continues to appear overloaded     Only negative 521 ml, though nephew reports this AM significant urine OP     Continue IV lasix  3. HTN     relative hypotension yesterday, better this AM  4. Mild leukocytosis     UA does not suggest infection     Will follow CBC/WBC  For questions or updates, please contact [place] Please consult  [email or web address] for contact info under Cardiology/STEMI.      Signed, Baldwin Jamaica, PA-C  11/10/2016, 8:17 AM    I have seen and examined this patient with Tommye Standard.  Agree with above, note added to reflect my findings.  On exam, tachycardic, no murmurs, mild crackles. Remains in atrial flutter, though rates are much better controlled on diltiazem. It is not diuresing as well as I would like and thus will likely need an extra dose of Lasix today. We will plan for cardioversion with TEE at the soonest availability.    Will M. Camnitz MD 11/10/2016 8:51 AM

## 2016-11-10 NOTE — Progress Notes (Addendum)
Pt is alert an oriented up to the bedside commode, 50cc of  bloody urinex2, pt denies pain, and denies nausea or vomiting. Md aware made aware of scheduled blood blood thinner. PA states it ok to give. Risk out way the benefit. Plan for TEE Nephew will Sign consent in the am d/t Pt being forgetful.

## 2016-11-10 NOTE — Discharge Instructions (Addendum)
Supplemental Discharge Instructions for  Pacemaker/Defibrillator Patients  Activity No heavy lifting or vigorous activity with your left/right arm for 6 to 8 weeks.  Do not raise your left/right arm above your head for one week.  Gradually raise your affected arm as drawn below.           __          11/21/16                  11/22/16                       11/23/16                     11/24/16   WOUND CARE - Keep the wound area clean and dry.  Do not get this area wet for one week. No showers for one week; you may shower on  11/24/16  . - The tape/steri-strips on your wound will fall off; do not pull them off.  No bandage is needed on the site.  DO  NOT apply any creams, oils, or ointments to the wound area. - If you notice any drainage or discharge from the wound, any swelling or bruising at the site, or you develop a fever > 101? F after you are discharged home, call the office at once.  Special Instructions - You are still able to use cellular telephones; use the ear opposite the side where you have your pacemaker/defibrillator.  Avoid carrying your cellular phone near your device. - When traveling through airports, show security personnel your identification card to avoid being screened in the metal detectors.  Ask the security personnel to use the hand wand. - Avoid arc welding equipment, TENS units (transcutaneous nerve stimulators).  Call the office for questions about other devices. - Avoid electrical appliances that are in poor condition or are not properly grounded. - Microwave ovens are safe to be near or to operate.    Information on my medicine - XARELTO (Rivaroxaban)  Why was Xarelto prescribed for you? Xarelto was prescribed for you to reduce the risk of a blood clot forming that can cause a stroke if you have a medical condition called atrial fibrillation (a type of irregular heartbeat).  What do you need to know about xarelto ? Take your Xarelto ONCE DAILY at the  same time every day with your evening meal. If you have difficulty swallowing the tablet whole, you may crush it and mix in applesauce just prior to taking your dose.  Take Xarelto exactly as prescribed by your doctor and DO NOT stop taking Xarelto without talking to the doctor who prescribed the medication.  Stopping without other stroke prevention medication to take the place of Xarelto may increase your risk of developing a clot that causes a stroke.  Refill your prescription before you run out.  After discharge, you should have regular check-up appointments with your healthcare provider that is prescribing your Xarelto.  In the future your dose may need to be changed if your kidney function or weight changes by a significant amount.  What do you do if you miss a dose? If you are taking Xarelto ONCE DAILY and you miss a dose, take it as soon as you remember on the same day then continue your regularly scheduled once daily regimen the next day. Do not take two doses of Xarelto at the same time or on the same day.  Important Safety Information A possible side effect of Xarelto is bleeding. You should call your healthcare provider right away if you experience any of the following: ? Bleeding from an injury or your nose that does not stop. ? Unusual colored urine (red or dark brown) or unusual colored stools (red or black). ? Unusual bruising for unknown reasons. ? A serious fall or if you hit your head (even if there is no bleeding).  Some medicines may interact with Xarelto and might increase your risk of bleeding while on Xarelto. To help avoid this, consult your healthcare provider or pharmacist prior to using any new prescription or non-prescription medications, including herbals, vitamins, non-steroidal anti-inflammatory drugs (NSAIDs) and supplements.  This website has more information on Xarelto: [email or web address]-benson.[email or web address]

## 2016-11-11 ENCOUNTER — Encounter (HOSPITAL_COMMUNITY)
Admission: AD | Disposition: A | Discharge status: Skilled Nursing Facility | Payer: Self-pay | Source: Ambulatory Visit | Attending: Cardiology

## 2016-11-11 ENCOUNTER — Encounter (HOSPITAL_COMMUNITY): Payer: Self-pay | Admitting: *Deleted

## 2016-11-11 ENCOUNTER — Inpatient Hospital Stay (HOSPITAL_COMMUNITY): Payer: Medicare Other

## 2016-11-11 ENCOUNTER — Inpatient Hospital Stay (HOSPITAL_COMMUNITY): Payer: Medicare Other | Admitting: Certified Registered Nurse Anesthetist

## 2016-11-11 DIAGNOSIS — I4819 Other persistent atrial fibrillation: Secondary | ICD-10-CM

## 2016-11-11 DIAGNOSIS — I340 Nonrheumatic mitral (valve) insufficiency: Secondary | ICD-10-CM

## 2016-11-11 DIAGNOSIS — I509 Heart failure, unspecified: Secondary | ICD-10-CM

## 2016-11-11 HISTORY — PX: CARDIOVERSION: SHX1299

## 2016-11-11 HISTORY — PX: TEE WITHOUT CARDIOVERSION: SHX5443

## 2016-11-11 LAB — BASIC METABOLIC PANEL
ANION GAP: 9 (ref 5–15)
BUN: 10 mg/dL (ref 6–20)
CO2: 28 mmol/L (ref 22–32)
Calcium: 8.6 mg/dL — ABNORMAL LOW (ref 8.9–10.3)
Chloride: 101 mmol/L (ref 101–111)
Creatinine, Ser: 1.1 mg/dL — ABNORMAL HIGH (ref 0.44–1.00)
GFR, EST AFRICAN AMERICAN: 49 mL/min — AB (ref >60)
GFR, EST NON AFRICAN AMERICAN: 43 mL/min — AB (ref >60)
Glucose, Bld: 110 mg/dL — ABNORMAL HIGH (ref 65–99)
POTASSIUM: 2.9 mmol/L — AB (ref 3.5–5.1)
SODIUM: 138 mmol/L (ref 135–145)

## 2016-11-11 LAB — CBC
HEMATOCRIT: 44.4 % (ref 36.0–46.0)
Hemoglobin: 13.7 g/dL (ref 12.0–15.0)
MCH: 28.6 pg (ref 26.0–34.0)
MCHC: 30.9 g/dL (ref 30.0–36.0)
MCV: 92.7 fL (ref 78.0–100.0)
Platelets: 215 10*3/uL (ref 150–400)
RBC: 4.79 MIL/uL (ref 3.87–5.11)
RDW: 15.8 % — ABNORMAL HIGH (ref 11.5–15.5)
WBC: 10.4 10*3/uL (ref 4.0–10.5)

## 2016-11-11 LAB — POTASSIUM: POTASSIUM: 3.7 mmol/L (ref 3.5–5.1)

## 2016-11-11 SURGERY — ECHOCARDIOGRAM, TRANSESOPHAGEAL
Anesthesia: General

## 2016-11-11 MED ORDER — POTASSIUM CHLORIDE 10 MEQ/100ML IV SOLN: 10.0000 meq | INTRAVENOUS | Status: DC

## 2016-11-11 MED ORDER — POTASSIUM CHLORIDE CRYS ER 20 MEQ PO TBCR
30.0000 meq | EXTENDED_RELEASE_TABLET | ORAL | Status: AC
  Administered 2016-11-11 (×3): 30 meq via ORAL
  Filled 2016-11-11: qty 1

## 2016-11-11 MED ORDER — POTASSIUM CHLORIDE CRYS ER 10 MEQ PO TBCR
30.0000 meq | EXTENDED_RELEASE_TABLET | Freq: Once | ORAL | Status: AC
  Administered 2016-11-11: 30 meq via ORAL
  Filled 2016-11-11: qty 1

## 2016-11-11 MED ORDER — POTASSIUM CHLORIDE CRYS ER 20 MEQ PO TBCR: 40.0000 meq | EXTENDED_RELEASE_TABLET | Freq: Once | ORAL | Status: DC

## 2016-11-11 MED ORDER — BUTAMBEN-TETRACAINE-BENZOCAINE 2-2-14 % EX AERO
INHALATION_SPRAY | CUTANEOUS | Status: DC | PRN
  Administered 2016-11-11: 2 via TOPICAL

## 2016-11-11 MED ORDER — PROPOFOL 500 MG/50ML IV EMUL
INTRAVENOUS | Status: DC | PRN
  Administered 2016-11-11: 50 ug/kg/min via INTRAVENOUS

## 2016-11-11 MED ORDER — PHENYLEPHRINE 40 MCG/ML (10ML) SYRINGE FOR IV PUSH (FOR BLOOD PRESSURE SUPPORT)
PREFILLED_SYRINGE | INTRAVENOUS | Status: DC | PRN
  Administered 2016-11-11: 120 ug via INTRAVENOUS
  Administered 2016-11-11 (×3): 80 ug via INTRAVENOUS

## 2016-11-11 MED ORDER — PROPOFOL 10 MG/ML IV BOLUS
INTRAVENOUS | Status: DC | PRN
  Administered 2016-11-11: 15 mg via INTRAVENOUS

## 2016-11-11 MED ORDER — EPHEDRINE SULFATE-NACL 50-0.9 MG/10ML-% IV SOSY
PREFILLED_SYRINGE | INTRAVENOUS | Status: DC | PRN
  Administered 2016-11-11: 20 mg via INTRAVENOUS

## 2016-11-11 MED ORDER — SODIUM CHLORIDE 0.9 % IV SOLN: INTRAVENOUS | Status: DC

## 2016-11-11 MED ORDER — SODIUM CHLORIDE 0.9 % IV SOLN
INTRAVENOUS | Status: DC | PRN
  Administered 2016-11-11: 08:00:00 via INTRAVENOUS

## 2016-11-11 NOTE — Progress Notes (Signed)
Progress Note  Patient Name: Cheryl Zamora Date of Encounter: 11/11/2016  Primary Cardiologist: new to [facility], Dr. Curt Bears  Subjective   Feeling improved with less SOB. TEE/CV planned today.  Inpatient Medications    Scheduled Meds: . [MAR Hold] furosemide  40 mg Intravenous Q12H  . [MAR Hold] memantine  10 mg Oral Daily  . [MAR Hold] metoprolol succinate  25 mg Oral Daily  . [MAR Hold] Rivaroxaban  15 mg Oral Q supper  . [MAR Hold] sodium chloride flush  3 mL Intravenous Q12H   Continuous Infusions: . sodium chloride    . sodium chloride    . diltiazem (CARDIZEM) infusion 10 mg/hr (11/10/16 2246)   PRN Meds: [MAR Hold] acetaminophen, [MAR Hold] nitroGLYCERIN, [MAR Hold] ondansetron (ZOFRAN) IV, [MAR Hold] sodium chloride flush   Vital Signs    Vitals:   11/10/16 2244 11/11/16 0017 11/11/16 0614 11/11/16 0720  BP: (!) 104/58 105/63 101/60 100/62  Pulse:  (!) 125 (!) 127 (!) 131  Resp:   18 20  Temp:   97.6 F (36.4 C) 98 F (36.7 C)  TempSrc:   Oral Oral  SpO2:  98% 97% 95%  Weight:   158 lb 12.8 oz (72 kg)   Height:        Intake/Output Summary (Last 24 hours) at 11/11/16 0745 Last data filed at 11/11/16 0700  Gross per 24 hour  Intake          1366.04 ml  Output             2775 ml  Net         -1408.96 ml   Filed Weights   11/09/16 1520 11/10/16 0705 11/11/16 0614  Weight: 160 lb 4.8 oz (72.7 kg) 158 lb 11.2 oz (72 kg) 158 lb 12.8 oz (72 kg)    Telemetry    Atrial flutter - Personally Reviewed  ECG    None new  Physical Exam   GEN: Well nourished, well developed, in no acute distress  HEENT: normal  Neck: no JVD, carotid bruits, or masses Cardiac: tachycardic, irregular; no murmurs, rubs, or gallops,no edema  Respiratory:  clear to auscultation bilaterally, normal work of breathing GI: soft, nontender, nondistended, + BS MS: no deformity or atrophy  Skin: warm and dry Neuro:  Strength and sensation are intact Psych: euthymic mood, full  affect   Labs    Chemistry  Recent Labs Lab 11/09/16 0943 11/10/16 0500  NA 135 136  K 3.9 3.8  CL 101 104  CO2 24 27  GLUCOSE 124* 122*  BUN 15 13  CREATININE 1.46* 1.30*  CALCIUM 9.4 8.7*  GFRNONAA 30* 35*  GFRAA 35* 40*  ANIONGAP 10 5     Hematology  Recent Labs Lab 11/09/16 0943 11/10/16 0756  WBC 11.1* 9.0  RBC 5.03 4.57  HGB 14.9 13.2  HCT 46.8* 42.3  MCV 93.0 92.6  MCH 29.6 28.9  MCHC 31.8 31.2  RDW 16.3* 16.2*  PLT 240 203    Cardiac EnzymesNo results for input(s): TROPONINI in the last 168 hours. No results for input(s): TROPIPOC in the last 168 hours.   BNPNo results for input(s): BNP, PROBNP in the last 168 hours.   DDimer No results for input(s): DDIMER in the last 168 hours.   Radiology    Dg Chest Port 1 View Result Date: 11/09/2016 CLINICAL DATA:  CHF. EXAM: PORTABLE CHEST 1 VIEW COMPARISON:  January 02, 2015. FINDINGS: Postsurgical changes related to prior CABG.  Stable cardiomegaly. Atherosclerotic calcification of the aortic arch. Normal pulmonary vascularity. Bibasilar atelectasis. No focal consolidation, pleural effusion, or pneumothorax. IMPRESSION: Stable cardiomegaly.  No active disease. Electronically Signed   By: Titus Dubin M.D.   On: 11/09/2016 16:22    Cardiac Studies   No new studies  Patient Profile     83 y.o. female with a hx of known PAFib with DCCV historically, CAD (CABG), HTN, admitted with AFib RVR and fluid OL  Assessment & Plan    1. Atrial flutter Missed dose of Xarelto, CHADS2VASc of 5. On diltiazem for rate control. intolerant of digoxin and amiodarone. Plan for TEE/CV today.  2. CHF Volume overload likely due to rapid rates. Kyomi Hector need to continue lasix.  3. HTN BP improved. Has been low with flutter.  4. Mild leukocytosis Normalized since admission    Signed, Arnel Wymer [name], MD  11/11/2016, 7:45 AM

## 2016-11-11 NOTE — Transfer of Care (Signed)
Immediate Anesthesia Transfer of Care Note  Patient: Cheryl Zamora  Procedure(s) Performed: Procedure(s): TRANSESOPHAGEAL ECHOCARDIOGRAM (TEE) (N/A) CARDIOVERSION (N/A)  Patient Location: endoscopy suite  Anesthesia Type:General  Level of Consciousness: alert , drowsy and patient cooperative  Airway & Oxygen Therapy: Patient Spontanous Breathing and Patient connected to nasal cannula oxygen  Post-op Assessment: Report given to RN and Post -op Vital signs reviewed and stable  Post vital signs: Reviewed and stable  Last Vitals:  Vitals:   11/11/16 0720 11/11/16 0858  BP: 100/62 (!) 96/33  Pulse: (!) 131 (!) 59  Resp: 20 (!) 29  Temp: 36.7 C   SpO2: 95% 99%    Last Pain:  Vitals:   11/11/16 0858  TempSrc: Oral  PainSc:          Complications: No apparent anesthesia complications

## 2016-11-11 NOTE — Anesthesia Preprocedure Evaluation (Addendum)
Anesthesia Evaluation  Patient identified by MRN, date of birth, ID band Patient awake    Reviewed: Allergy & Precautions, H&P , NPO status , Patient's Chart, lab work & pertinent test results, reviewed documented beta blocker date and time   History of Anesthesia Complications Negative for: history of anesthetic complications  Airway Mallampati: I  TM Distance: >3 FB Neck ROM: Full    Dental  (+) Teeth Intact, Dental Advisory Given   Pulmonary former smoker,    breath sounds clear to auscultation       Cardiovascular hypertension, Pt. on medications and Pt. on home beta blockers + CAD, + Peripheral Vascular Disease and +CHF  + dysrhythmias Atrial Fibrillation  Rhythm:Irregular Rate:Tachycardia     Neuro/Psych negative neurological ROS  negative psych ROS   GI/Hepatic negative GI ROS, Neg liver ROS,   Endo/Other  negative endocrine ROS  Renal/GU Renal InsufficiencyRenal disease     Musculoskeletal  (+) Arthritis , Osteoarthritis,    Abdominal   Peds  Hematology negative hematology ROS (+)   Anesthesia Other Findings Dilt gtt  Reproductive/Obstetrics                             Anesthesia Physical Anesthesia Plan  ASA: III  Anesthesia Plan: General   Post-op Pain Management:    Induction: Intravenous  PONV Risk Score and Plan: 3 and Treatment may vary due to age or medical condition  Airway Management Planned: Nasal Cannula  Additional Equipment: None  Intra-op Plan:   Post-operative Plan:   Informed Consent: I have reviewed the patients History and Physical, chart, labs and discussed the procedure including the risks, benefits and alternatives for the proposed anesthesia with the patient or authorized representative who has indicated his/her understanding and acceptance.   Dental advisory given  Plan Discussed with: CRNA and Surgeon  Anesthesia Plan Comments:         Anesthesia Quick Evaluation

## 2016-11-11 NOTE — Anesthesia Procedure Notes (Signed)
Procedure Name: MAC Date/Time: 11/11/2016 8:34 AM Performed by: Everlean Cherry A Pre-anesthesia Checklist: Patient identified, Emergency Drugs available, Suction available and Patient being monitored Patient Re-evaluated:Patient Re-evaluated prior to induction Oxygen Delivery Method: Nasal cannula

## 2016-11-11 NOTE — Progress Notes (Signed)
  Echocardiogram Echocardiogram Transesophageal has been performed.  Tresa Res 11/11/2016, 9:27 AM

## 2016-11-11 NOTE — H&P (View-Only) (Signed)
Progress Note  Patient Name: Cheryl Zamora Date of Encounter: 11/11/2016  Primary Cardiologist: new to [facility], Dr. Curt Bears  Subjective   Feeling improved with less SOB. TEE/CV planned today.  Inpatient Medications    Scheduled Meds: . [MAR Hold] furosemide  40 mg Intravenous Q12H  . [MAR Hold] memantine  10 mg Oral Daily  . [MAR Hold] metoprolol succinate  25 mg Oral Daily  . [MAR Hold] Rivaroxaban  15 mg Oral Q supper  . [MAR Hold] sodium chloride flush  3 mL Intravenous Q12H   Continuous Infusions: . sodium chloride    . sodium chloride    . diltiazem (CARDIZEM) infusion 10 mg/hr (11/10/16 2246)   PRN Meds: [MAR Hold] acetaminophen, [MAR Hold] nitroGLYCERIN, [MAR Hold] ondansetron (ZOFRAN) IV, [MAR Hold] sodium chloride flush   Vital Signs    Vitals:   11/10/16 2244 11/11/16 0017 11/11/16 0614 11/11/16 0720  BP: (!) 104/58 105/63 101/60 100/62  Pulse:  (!) 125 (!) 127 (!) 131  Resp:   18 20  Temp:   97.6 F (36.4 C) 98 F (36.7 C)  TempSrc:   Oral Oral  SpO2:  98% 97% 95%  Weight:   158 lb 12.8 oz (72 kg)   Height:        Intake/Output Summary (Last 24 hours) at 11/11/16 0745 Last data filed at 11/11/16 0700  Gross per 24 hour  Intake          1366.04 ml  Output             2775 ml  Net         -1408.96 ml   Filed Weights   11/09/16 1520 11/10/16 0705 11/11/16 0614  Weight: 160 lb 4.8 oz (72.7 kg) 158 lb 11.2 oz (72 kg) 158 lb 12.8 oz (72 kg)    Telemetry    Atrial flutter - Personally Reviewed  ECG    None new  Physical Exam   GEN: Well nourished, well developed, in no acute distress  HEENT: normal  Neck: no JVD, carotid bruits, or masses Cardiac: tachycardic, irregular; no murmurs, rubs, or gallops,no edema  Respiratory:  clear to auscultation bilaterally, normal work of breathing GI: soft, nontender, nondistended, + BS MS: no deformity or atrophy  Skin: warm and dry Neuro:  Strength and sensation are intact Psych: euthymic mood, full  affect   Labs    Chemistry  Recent Labs Lab 11/09/16 0943 11/10/16 0500  NA 135 136  K 3.9 3.8  CL 101 104  CO2 24 27  GLUCOSE 124* 122*  BUN 15 13  CREATININE 1.46* 1.30*  CALCIUM 9.4 8.7*  GFRNONAA 30* 35*  GFRAA 35* 40*  ANIONGAP 10 5     Hematology  Recent Labs Lab 11/09/16 0943 11/10/16 0756  WBC 11.1* 9.0  RBC 5.03 4.57  HGB 14.9 13.2  HCT 46.8* 42.3  MCV 93.0 92.6  MCH 29.6 28.9  MCHC 31.8 31.2  RDW 16.3* 16.2*  PLT 240 203    Cardiac EnzymesNo results for input(s): TROPONINI in the last 168 hours. No results for input(s): TROPIPOC in the last 168 hours.   BNPNo results for input(s): BNP, PROBNP in the last 168 hours.   DDimer No results for input(s): DDIMER in the last 168 hours.   Radiology    Dg Chest Port 1 View Result Date: 11/09/2016 CLINICAL DATA:  CHF. EXAM: PORTABLE CHEST 1 VIEW COMPARISON:  January 02, 2015. FINDINGS: Postsurgical changes related to prior CABG.  Stable cardiomegaly. Atherosclerotic calcification of the aortic arch. Normal pulmonary vascularity. Bibasilar atelectasis. No focal consolidation, pleural effusion, or pneumothorax. IMPRESSION: Stable cardiomegaly.  No active disease. Electronically Signed   By: Titus Dubin M.D.   On: 11/09/2016 16:22    Cardiac Studies   No new studies  Patient Profile     83 y.o. female with a hx of known PAFib with DCCV historically, CAD (CABG), HTN, admitted with AFib RVR and fluid OL  Assessment & Plan    1. Atrial flutter Missed dose of Xarelto, CHADS2VASc of 5. On diltiazem for rate control. intolerant of digoxin and amiodarone. Plan for TEE/CV today.  2. CHF Volume overload likely due to rapid rates. Cheryl Zamora need to continue lasix.  3. HTN BP improved. Has been low with flutter.  4. Mild leukocytosis Normalized since admission    Signed, Luvina Poirier [name], MD  11/11/2016, 7:45 AM

## 2016-11-11 NOTE — Progress Notes (Signed)
Patient am void did not appear to contain blood.

## 2016-11-11 NOTE — Progress Notes (Signed)
Patient alert and oriented x 4. No complaints of pain. Blood tinged urine x 2  on night shift. Paged cardiology on call and informed. Awaiting on possible orders. VS stable.  Will continue to monitor.   Nianna Igo, RN

## 2016-11-11 NOTE — CV Procedure (Signed)
TEE/DCC: Propofol Anesthesia  Severe biatrial enlargement restrictive appearing morphology  Normal LV EF 60% Moderate MR/TR AV sclerosis No LAA thrombus Lipomatous hypertrophy atrial septum Moderate bulky atherosclerotic debris  DCC x1 converted from flutter 124 to SB and junctional rates 45-62   Successful DCC with evidence SSS. Given size of atrial and AV valve regurgitation Suspect she will not hold sinus May need pacer and further AV nodal drugs / AAT  No immediate neurologic sequelae on DOAC  Jenkins Rouge

## 2016-11-11 NOTE — Interval H&P Note (Signed)
History and Physical Interval Note:  11/11/2016 7:57 AM  Cheryl Zamora  has presented today for surgery, with the diagnosis of AFIB  The various methods of treatment have been discussed with the patient and family. After consideration of risks, benefits and other options for treatment, the patient has consented to  Procedure(s): TRANSESOPHAGEAL ECHOCARDIOGRAM (TEE) (N/A) CARDIOVERSION (N/A) as a surgical intervention .  The patient's history has been reviewed, patient examined, no change in status, stable for surgery.  I have reviewed the patient's chart and labs.  Questions were answered to the patient's satisfaction.     Jenkins Rouge

## 2016-11-12 ENCOUNTER — Ambulatory Visit (HOSPITAL_COMMUNITY): Admission: RE | Admit: 2016-11-12 | Payer: Medicare Other | Source: Ambulatory Visit | Admitting: Internal Medicine

## 2016-11-12 ENCOUNTER — Encounter (HOSPITAL_COMMUNITY): Payer: Self-pay | Admitting: Cardiovascular Disease

## 2016-11-12 ENCOUNTER — Other Ambulatory Visit: Payer: Self-pay

## 2016-11-12 ENCOUNTER — Encounter (HOSPITAL_COMMUNITY)
Admission: AD | Disposition: A | Discharge status: Skilled Nursing Facility | Payer: Self-pay | Source: Ambulatory Visit | Attending: Cardiology

## 2016-11-12 DIAGNOSIS — I481 Persistent atrial fibrillation: Secondary | ICD-10-CM

## 2016-11-12 LAB — URINALYSIS, ROUTINE W REFLEX MICROSCOPIC
BACTERIA UA: NONE SEEN
Bilirubin Urine: NEGATIVE
Glucose, UA: NEGATIVE mg/dL
KETONES UR: NEGATIVE mg/dL
Leukocytes, UA: NEGATIVE
Nitrite: NEGATIVE
Protein, ur: NEGATIVE mg/dL
Specific Gravity, Urine: 1.008 (ref 1.005–1.030)
pH: 6 (ref 5.0–8.0)

## 2016-11-12 LAB — BASIC METABOLIC PANEL
ANION GAP: 10 (ref 5–15)
BUN: 11 mg/dL (ref 6–20)
CO2: 25 mmol/L (ref 22–32)
Calcium: 9.1 mg/dL (ref 8.9–10.3)
Chloride: 101 mmol/L (ref 101–111)
Creatinine, Ser: 1.33 mg/dL — ABNORMAL HIGH (ref 0.44–1.00)
GFR calc Af Amer: 39 mL/min — ABNORMAL LOW (ref >60)
GFR, EST NON AFRICAN AMERICAN: 34 mL/min — AB (ref >60)
GLUCOSE: 145 mg/dL — AB (ref 65–99)
POTASSIUM: 4.4 mmol/L (ref 3.5–5.1)
Sodium: 136 mmol/L (ref 135–145)

## 2016-11-12 SURGERY — ECHOCARDIOGRAM, TRANSESOPHAGEAL
Anesthesia: Moderate Sedation

## 2016-11-12 NOTE — Plan of Care (Signed)
Problem: Education: Goal: Knowledge of [facility] Education information/materials will improve Outcome: Progressing Nephew at bedside for Teach on Calling for help and over all plan

## 2016-11-12 NOTE — Progress Notes (Signed)
Patient has order to D/C telemetry, consider renewing order. Thanks

## 2016-11-12 NOTE — Progress Notes (Signed)
CCMD called, patient in junctional rhythm. RN reviewed strip, looks junky, but similar to a. fib. Will do EKG and notify MD.

## 2016-11-12 NOTE — Progress Notes (Signed)
Patient complains of a slight sore throat, RN reminded her that she had a TEE today. Patient very forgetful.

## 2016-11-12 NOTE — Anesthesia Postprocedure Evaluation (Signed)
Anesthesia Post Note  Patient: Cheryl Zamora  Procedure(s) Performed: Procedure(s) (LRB): TRANSESOPHAGEAL ECHOCARDIOGRAM (TEE) (N/A) CARDIOVERSION (N/A)     Patient location during evaluation: Endoscopy Anesthesia Type: MAC Level of consciousness: awake and alert Pain management: pain level controlled Vital Signs Assessment: post-procedure vital signs reviewed and stable Respiratory status: spontaneous breathing, nonlabored ventilation, respiratory function stable and patient connected to nasal cannula oxygen Cardiovascular status: stable Postop Assessment: no apparent nausea or vomiting Anesthetic complications: no    Last Vitals:  Vitals:   11/12/16 1148 11/12/16 2020  BP: 101/61 124/71  Pulse: 78 (!) 126  Resp: 18 18  Temp: 36.8 C 36.9 C  SpO2: 98% 96%    Last Pain:  Vitals:   11/12/16 2020  TempSrc: Oral  PainSc:                  [name]

## 2016-11-12 NOTE — Progress Notes (Signed)
Noticed red urine, apparently this was noted and MD was called.

## 2016-11-12 NOTE — Progress Notes (Signed)
Paged PA to update of increased confusion and continous bloody urine. Recommend a urinalysis.

## 2016-11-12 NOTE — Progress Notes (Signed)
Progress Note  Patient Name: Cheryl Zamora Date of Encounter: 11/12/2016  Primary Cardiologist: new to [facility], Dr. Curt Zamora  Subjective   TEE/CV yesterday with conversion to sinus rhythm. Had junctional bradycardia as well with possible sick sinus syndrome. Feeling well today. Has not been up and walking since cardioversion.  Inpatient Medications    Scheduled Meds: . furosemide  40 mg Intravenous Q12H  . memantine  10 mg Oral Daily  . metoprolol succinate  25 mg Oral Daily  . Rivaroxaban  15 mg Oral Q supper  . sodium chloride flush  3 mL Intravenous Q12H   Continuous Infusions: . sodium chloride     PRN Meds: acetaminophen, nitroGLYCERIN, ondansetron (ZOFRAN) IV, sodium chloride flush   Vital Signs    Vitals:   11/11/16 2049 11/11/16 2356 11/12/16 0533 11/12/16 0813  BP: (!) 134/98 112/60 113/63 130/60  Pulse: 75 80 (!) 55 (!) 58  Resp: 18 18 18 18   Temp: 98.2 F (36.8 C) 98.4 F (36.9 C) 98.2 F (36.8 C) 98.2 F (36.8 C)  TempSrc: Oral Oral Oral Oral  SpO2: 98% 94% 94% 95%  Weight:   156 lb 8 oz (71 kg)   Height:        Intake/Output Summary (Last 24 hours) at 11/12/16 0831 Last data filed at 11/12/16 0816  Gross per 24 hour  Intake             3630 ml  Output             2161 ml  Net             1469 ml   Filed Weights   11/10/16 0705 11/11/16 0614 11/12/16 0533  Weight: 158 lb 11.2 oz (72 kg) 158 lb 12.8 oz (72 kg) 156 lb 8 oz (71 kg)    Telemetry    Atrial fibrillation with slow response - Personally Reviewed  ECG    Atrial fibrillation  Physical Exam   GEN: Well nourished, well developed, in no acute distress  HEENT: normal  Neck: no JVD, carotid bruits, or masses Cardiac: RRR; no murmurs, rubs, or gallops,no edema  Respiratory:  clear to auscultation bilaterally, normal work of breathing GI: soft, nontender, nondistended, + BS MS: no deformity or atrophy  Skin: warm and dry Neuro:  Strength and sensation are intact Psych: euthymic  mood, full affect   Labs    Chemistry  Recent Labs Lab 11/09/16 0943 11/10/16 0500 11/11/16 0941 11/11/16 1623  NA 135 136 138  --   K 3.9 3.8 2.9* 3.7  CL 101 104 101  --   CO2 24 27 28   --   GLUCOSE 124* 122* 110*  --   BUN 15 13 10   --   CREATININE 1.46* 1.30* 1.10*  --   CALCIUM 9.4 8.7* 8.6*  --   GFRNONAA 30* 35* 43*  --   GFRAA 35* 40* 49*  --   ANIONGAP 10 5 9   --      Hematology  Recent Labs Lab 11/09/16 0943 11/10/16 0756 11/11/16 0941  WBC 11.1* 9.0 10.4  RBC 5.03 4.57 4.79  HGB 14.9 13.2 13.7  HCT 46.8* 42.3 44.4  MCV 93.0 92.6 92.7  MCH 29.6 28.9 28.6  MCHC 31.8 31.2 30.9  RDW 16.3* 16.2* 15.8*  PLT 240 203 215    Cardiac EnzymesNo results for input(s): TROPONINI in the last 168 hours. No results for input(s): TROPIPOC in the last 168 hours.   BNPNo  results for input(s): BNP, PROBNP in the last 168 hours.   DDimer No results for input(s): DDIMER in the last 168 hours.   Radiology    Dg Chest Port 1 View Result Date: 11/09/2016 CLINICAL DATA:  CHF. EXAM: PORTABLE CHEST 1 VIEW COMPARISON:  January 02, 2015. FINDINGS: Postsurgical changes related to prior CABG. Stable cardiomegaly. Atherosclerotic calcification of the aortic arch. Normal pulmonary vascularity. Bibasilar atelectasis. No focal consolidation, pleural effusion, or pneumothorax. IMPRESSION: Stable cardiomegaly.  No active disease. Electronically Signed   By: Titus Dubin M.D.   On: 11/09/2016 16:22    Cardiac Studies   No new studies  Patient Profile     83 y.o. female with a hx of known PAFib with DCCV historically, CAD (CABG), HTN, admitted with AFib RVR and fluid OL  Assessment & Plan    1. Atrial flutter On Xarelto for CHADS2VASc of 5. Cardioverted yesterday to sinus rhythm. Is in AF today with slow response. Feeling improved with possibly no symptoms. Cheryl Zamora have PT see here today, recheck BMP. If she is doing well, may be able to be discharged with follow up in AF  clinic.  2. CHF Volume and creatininie improving. Plan to continue lasix at current doses. Check BMP today. Cheryl Zamora need BMP as outpatient to further assess K.  3. HTN BP improved with rate control and conversion out of flutter.  4. Mild leukocytosis Normalized since admission.    Signed, Cheryl Atienza [name], MD  11/12/2016, 8:31 AM

## 2016-11-12 NOTE — NC FL2 (Signed)
[place] LEVEL OF CARE SCREENING TOOL     IDENTIFICATION  Patient Name: Cheryl Zamora Birthdate: [birth year] Sex: female Admission Date (Current Location): 11/09/2016  [facility] and [place] Number:  Herbalist and Address:  [facility], [place] [address] [place], [place] [address]      Provider Number: [id number]  Attending Physician Name and Address:  Constance Haw, MD  Relative Name and Phone Number:       Current Level of Care: Hospital Recommended Level of Care: [place] Prior Approval Number:    Date Approved/Denied:   PASRR Number: [id number]  Discharge Plan: SNF    Current Diagnoses: Patient Active Problem List   Diagnosis Date Noted  . Persistent atrial fibrillation ([place])   . CHF (congestive heart failure) ([place]) 11/09/2016  . DVT (deep venous thrombosis) ([place]) 01/13/2015  . Cellulitis of both lower extremities 01/13/2015  . Cellulitis 01/13/2015  . Acute deep vein thrombosis (DVT) of popliteal vein of left lower extremity ([place])   . Hematuria   . Acute on chronic renal insufficiency 01/06/2015  . Atrial fibrillation with RVR ([place])   . Sepsis ([place]) 01/02/2015  . UTI (lower urinary tract infection) 01/02/2015  . Paroxysmal atrial fibrillation (HCC)   . Chronic diastolic (congestive) heart failure ([place])   . Atrial fibrillation with rapid ventricular response ([place])   . Acute on chronic combined systolic and diastolic congestive heart failure ([place]) 04/12/2014  . CKD (chronic kidney disease) stage 3, GFR 30-59 ml/min 04/11/2014  . CAD, multiple vessel, hx CABG 2012 04/11/2014  . Long term current use of anticoagulant therapy 12/21/2012  . Physical deconditioning 12/17/2012  . PAF (paroxysmal atrial fibrillation) ([place]) 12/12/2012  . Chronic diastolic CHF (congestive heart failure), NYHA class 3 ([place]) 12/12/2012  . Essential hypertension   . Neuropathy     Orientation RESPIRATION BLADDER Height  & Weight     Self, Time, Situation, Place  Normal Continent Weight: 156 lb 8 oz (71 kg) (scale c) Height:  5\' 4"  (162.6 cm)  BEHAVIORAL SYMPTOMS/MOOD NEUROLOGICAL BOWEL NUTRITION STATUS      Continent Diet (cardiac)  AMBULATORY STATUS COMMUNICATION OF NEEDS Skin   Limited Assist Verbally Normal                       Personal Care Assistance Level of Assistance  Bathing, Dressing Bathing Assistance: Limited assistance   Dressing Assistance: Limited assistance     Functional Limitations Info             SPECIAL CARE FACTORS FREQUENCY  PT (By licensed PT), OT (By licensed OT)     PT Frequency: 5/wk OT Frequency: 5/wk            Contractures      Additional Factors Info  Code Status, Allergies Code Status Info: FULL Allergies Info: Digoxin And Related, Amiodarone, Codeine, Penicillins           Current Medications (11/12/2016):  This is the current hospital active medication list Current Facility-Administered Medications  Medication Dose Route Frequency Provider Last Rate Last Dose  . 0.9 %  sodium chloride infusion  250 mL Intravenous Continuous Baldwin Jamaica, PA-C      . acetaminophen (TYLENOL) tablet 650 mg  650 mg Oral Q6H PRN Baldwin Jamaica, PA-C      . furosemide (LASIX) injection 40 mg  40 mg Intravenous Q12H Camnitz, Ocie Doyne, MD   40 mg at  11/12/16 9371  . memantine (NAMENDA) tablet 10 mg  10 mg Oral Daily Baldwin Jamaica, PA-C   10 mg at 11/12/16 1039  . metoprolol succinate (TOPROL-XL) 24 hr tablet 25 mg  25 mg Oral Daily Baldwin Jamaica, PA-C   25 mg at 11/12/16 1039  . nitroGLYCERIN (NITROSTAT) SL tablet 0.4 mg  0.4 mg Sublingual Q5 Min x 3 PRN Baldwin Jamaica, PA-C      . ondansetron [facility]) injection 4 mg  4 mg Intravenous Q6H PRN Baldwin Jamaica, PA-C      . Rivaroxaban (XARELTO) tablet 15 mg  15 mg Oral Q supper Baldwin Jamaica, PA-C   15 mg at 11/11/16 1722  . sodium chloride flush (NS) 0.9 % injection 3 mL  3 mL  Intravenous Q12H Baldwin Jamaica, PA-C   3 mL at 11/11/16 2117  . sodium chloride flush (NS) 0.9 % injection 3 mL  3 mL Intravenous PRN Baldwin Jamaica, PA-C         Discharge Medications: Please see discharge summary for a list of discharge medications.  Relevant Imaging Results:  Relevant Lab Results:   Additional Information SS#: [id number]  Jorge Ny, LCSW

## 2016-11-12 NOTE — Evaluation (Signed)
Physical Therapy Evaluation Patient Details Name: Cheryl Zamora MRN: [id number] DOB: Jul 21, 1923 Today's Date: 11/12/2016   History of Present Illness  Cheryl Zamora is a 83 y.o. female with a hx of known PAFib with DCCV historically, CAD (CABG), HTN,  who is being admitted with fluid OL and rapid AF.  cardioversion on 9/13.    Past Medical History:  Diagnosis Date  . Atrial fibrillation with rapid ventricular response ([place]) 11/2012; 10/2013   a. s/p TEE/DCCV 11/2012 & 10/2013 b. amiodarone caused significant QT prlongation (>643msec)  . Chronic combined systolic and diastolic CHF (congestive heart failure) ([place])   . Coronary artery disease 2005   a. s/p CABG (2012)  . GERD (gastroesophageal reflux disease)   . Hyperlipidemia    pt denies this hx on 01/03/2015  . Hypertension   . Neuropathy   . Osteoarthritis of back     Clinical Impression  Pt admitted with above diagnosis. Pt currently with functional limitations due to the deficits listed below (see PT Problem List). Pt was able to ambulate with rW but needed cues for safety and balance as she needs min assist at times with multiple standing rest breaks.  Pt DOE 1/4 but after walking she c/o feeling like she can't breathe.  Her sats and HR WNL.  Lives alone therefore may need SNF at d/c.  Will follow acutely.  Pt will benefit from skilled PT to increase their independence and safety with mobility to allow discharge to the venue listed below.      Follow Up Recommendations SNF;Supervision/Assistance - 24 hour    Equipment Recommendations  None recommended by PT    Recommendations for Other Services       Precautions / Restrictions Precautions Precautions: Fall Restrictions Weight Bearing Restrictions: No      Mobility  Bed Mobility               General bed mobility comments: in chair on arrival  Transfers Overall transfer level: Needs assistance Equipment used: Rolling walker (2 wheeled) Transfers: Sit to/from  Stand Sit to Stand: Min assist         General transfer comment: Initial stand needing min assist as she lost balance posteriorly needing steadying assist.   Ambulation/Gait Ambulation/Gait assistance: Min assist Ambulation Distance (Feet): 110 Feet Assistive device: Rolling walker (2 wheeled) Gait Pattern/deviations: Step-through pattern;Decreased stride length;Trunk flexed;Wide base of support;Drifts right/left   Gait velocity interpretation: Below normal speed for age/gender General Gait Details: Pt at times lets RW get too far ahead of her and needs cues to stay close to RW.  NEeded mod cues for safety with RW andpt uses RW at home.  Pt had difficulty navigating in enclosed spaces.  Flexed posture at times as well.  Multiple standing rest breaks as pt states she is "just tired." Was in hall, and pt asked to come back to room to go to bathroom.  Pt was able to make it to bathroom.  Needed total assist to get clean.   Stairs            Wheelchair Mobility    Modified Rankin (Stroke Patients Only)       Balance Overall balance assessment: Needs assistance;History of Falls Sitting-balance support: No upper extremity supported;Feet supported Sitting balance-Leahy Scale: Good     Standing balance support: Bilateral upper extremity supported;During functional activity Standing balance-Leahy Scale: Poor Standing balance comment: relies on UE support for balance.  Pt stopped at sink to wash hands but needed  min assist steadying for balance when she didn't have UE support.                              Pertinent Vitals/Pain Pain Assessment: No/denies pain    Home Living Family/patient expects to be discharged to:: Private residence Living Arrangements: Alone Available Help at Discharge: Family;Available PRN/intermittently (nephew) Type of Home: House Home Access: Stairs to enter Entrance Stairs-Rails: Left Entrance Stairs-Number of Steps: 2 Home Layout: One  level Home Equipment: Walker - 2 wheels;Shower seat Additional Comments: Nephew provides transport and assist with meals.    Prior Function Level of Independence: Independent with assistive device(s)               Hand Dominance        Extremity/Trunk Assessment   Upper Extremity Assessment Upper Extremity Assessment: Defer to OT evaluation    Lower Extremity Assessment Lower Extremity Assessment: Overall WFL for tasks assessed    Cervical / Trunk Assessment Cervical / Trunk Assessment: Kyphotic  Communication   Communication: HOH  Cognition Arousal/Alertness: Awake/alert Behavior During Therapy: WFL for tasks assessed/performed Overall Cognitive Status: Within Functional Limits for tasks assessed                                        General Comments      Exercises     Assessment/Plan    PT Assessment Patient needs continued PT services  PT Problem List Decreased activity tolerance;Decreased balance;Decreased mobility;Decreased knowledge of use of DME;Decreased safety awareness;Decreased knowledge of precautions;Cardiopulmonary status limiting activity       PT Treatment Interventions DME instruction;Gait training;Functional mobility training;Therapeutic activities;Balance training;Therapeutic exercise;Stair training;Patient/family education    PT Goals (Current goals can be found in the Care Plan section)  Acute Rehab PT Goals Patient Stated Goal: to get better PT Goal Formulation: With patient Time For Goal Achievement: 11/26/16 Potential to Achieve Goals: Good    Frequency Min 3X/week   Barriers to discharge Decreased caregiver support      Co-evaluation               AM-PAC PT "6 Clicks" Daily Activity  Outcome Measure Difficulty turning over in bed (including adjusting bedclothes, sheets and blankets)?: A Little Difficulty moving from lying on back to sitting on the side of the bed? : A Little Difficulty sitting down  on and standing up from a chair with arms (e.g., wheelchair, bedside commode, etc,.)?: A Lot Help needed moving to and from a bed to chair (including a wheelchair)?: A Lot Help needed walking in hospital room?: A Little Help needed climbing 3-5 steps with a railing? : A Lot 6 Click Score: 15    End of Session Equipment Utilized During Treatment: Gait belt Activity Tolerance: Patient limited by fatigue Patient left: in chair;with call bell/phone within reach;with chair alarm set Nurse Communication: Mobility status PT Visit Diagnosis: Muscle weakness (generalized) (M62.81);Difficulty in walking, not elsewhere classified (R26.2)    Time: 4540-9811 PT Time Calculation (min) (ACUTE ONLY): 22 min   Charges:   PT Evaluation $PT Eval Moderate Complexity: 1 Mod     PT G Codes:        Stafford Riviera,PT Acute Rehabilitation [phone] (pager)   Denice Paradise 11/12/2016, 10:40 AM

## 2016-11-12 NOTE — Care Management Important Message (Signed)
Important Message  Patient Details  Name: Cheryl Zamora MRN: [id number] Date of Birth: 03-09-1923   Medicare Important Message Given:  Yes    [name] 11/12/2016, 12:34 PM

## 2016-11-13 DIAGNOSIS — I5033 Acute on chronic diastolic (congestive) heart failure: Secondary | ICD-10-CM

## 2016-11-13 MED ORDER — METOPROLOL SUCCINATE ER 50 MG PO TB24
50.0000 mg | ORAL_TABLET | Freq: Every day | ORAL | Status: DC
  Administered 2016-11-13 – 2016-11-14 (×2): 50 mg via ORAL
  Filled 2016-11-13 (×2): qty 1

## 2016-11-13 NOTE — Progress Notes (Signed)
Progress Note  Patient Name: Cheryl Zamora Date of Encounter: 11/13/2016  Primary Cardiologist: new to [facility], Dr. Curt Bears  Patient Profile     83 y.o. female with a hx of known PAFib with DCCV , CAD (CABG), HTN, admitted with AFib RVR Acute on chronic diastolic heart failure.  Underwent cardioversion 0/45 complicated by junctional rhythm she has reverted to atrial fibrillation with rapid ventricular rate.  TEE demonstrated severe LAE/RAE.  Interestingly echocardiogram 2/16 demonstrated severe RA/RV lead but only mild LAE.  Hx  Reviewed Has been in hosp 4/45yrs for AFib/CHF   She has been intolerant of amiodarone and digoxin in the past  Subjective   Feels good without palps or sob--,lying in bed  Inpatient Medications    Scheduled Meds: . furosemide  40 mg Intravenous Q12H  . memantine  10 mg Oral Daily  . metoprolol succinate  25 mg Oral Daily  . Rivaroxaban  15 mg Oral Q supper  . sodium chloride flush  3 mL Intravenous Q12H   Continuous Infusions: . sodium chloride     PRN Meds: acetaminophen, nitroGLYCERIN, ondansetron (ZOFRAN) IV, sodium chloride flush   Vital Signs    Vitals:   11/12/16 2020 11/13/16 0524 11/13/16 1100 11/13/16 1211  BP: 124/71 110/74 99/66 (!) 91/56  Pulse: (!) 126 (!) 131 (!) 130 (!) 120  Resp: 18 18 16 18   Temp: 98.5 F (36.9 C) 97.8 F (36.6 C)  98 F (36.7 C)  TempSrc: Oral Oral  Oral  SpO2: 96% 96% 97% 96%  Weight:  151 lb 3.2 oz (68.6 kg)    Height:        Intake/Output Summary (Last 24 hours) at 11/13/16 1339 Last data filed at 11/13/16 1246  Gross per 24 hour  Intake              600 ml  Output             4900 ml  Net            -4300 ml   Filed Weights   11/11/16 0614 11/12/16 0533 11/13/16 0524  Weight: 158 lb 12.8 oz (72 kg) 156 lb 8 oz (71 kg) 151 lb 3.2 oz (68.6 kg)    Telemetry    Atrial fibrillation with RVR began 1800 9/14  ECG     Physical Exam   Well developed and nourished in no acute distress HENT  normal Neck supple with JVP-flat Carotids brisk and full without bruits Clear Irregularly irregular rate and rhythm with rapid  ventricular response, no murmurs or gallops Abd-soft with active BS without hepatomegaly No Clubbing cyanosis edema Skin-warm and dry A & Oriented  Grossly normal sensory and motor function   Labs    Chemistry  Recent Labs Lab 11/10/16 0500 11/11/16 0941 11/11/16 1623 11/12/16 0851  NA 136 138  --  136  K 3.8 2.9* 3.7 4.4  CL 104 101  --  101  CO2 27 28  --  25  GLUCOSE 122* 110*  --  145*  BUN 13 10  --  11  CREATININE 1.30* 1.10*  --  1.33*  CALCIUM 8.7* 8.6*  --  9.1  GFRNONAA 35* 43*  --  34*  GFRAA 40* 49*  --  39*  ANIONGAP 5 9  --  10     Hematology  Recent Labs Lab 11/09/16 0943 11/10/16 0756 11/11/16 0941  WBC 11.1* 9.0 10.4  RBC 5.03 4.57 4.79  HGB 14.9 13.2  13.7  HCT 46.8* 42.3 44.4  MCV 93.0 92.6 92.7  MCH 29.6 28.9 28.6  MCHC 31.8 31.2 30.9  RDW 16.3* 16.2* 15.8*  PLT 240 203 215    Cardiac EnzymesNo results for input(s): TROPONINI in the last 168 hours. No results for input(s): TROPIPOC in the last 168 hours.   BNPNo results for input(s): BNP, PROBNP in the last 168 hours.   DDimer No results for input(s): DDIMER in the last 168 hours.   Radiology    Dg Chest Port 1 View Result Date: 11/09/2016 CLINICAL DATA:  CHF. EXAM: PORTABLE CHEST 1 VIEW COMPARISON:  January 02, 2015. FINDINGS: Postsurgical changes related to prior CABG. Stable cardiomegaly. Atherosclerotic calcification of the aortic arch. Normal pulmonary vascularity. Bibasilar atelectasis. No focal consolidation, pleural effusion, or pneumothorax. IMPRESSION: Stable cardiomegaly.  No active disease. Electronically Signed   By: Titus Dubin M.D.   On: 11/09/2016 16:22    Cardiac Studies   No new studies    Assessment & Plan    1. Atrial flutter   2. CHF-Diastolic    3. HTN BP improved with rate control and conversion out of flutter.  4.   Sinus node dysfunction with juntional rhythm   Lengthy discussion with pt and her nephew [name] POA-- Goal is rate control and keeping her out of the hospital  He has done great job with her and her meds to do the latter.  The alternative of rhythm control is low yield given atrial dimension and would require amiodarone with her bradycardia would almost certainly require pacing. The alternative for rate control would be AV junction ablation and pacing. They are both open to pacing but disinclined. Given the infrequency of her hospitalizations, I could only command the 2 of them for having manage their way through the rock and the hard place by which I mean the infrequency of hospitalizations.  We have elected then to resume a strategy of rate control with metoprolol. In the event that this fails in the near term, we can always undertake AV ablation and pacing.   The family would like to resume Coumadin. Managing the Coumadin has been difficult. I think this lady's chronic conditions may well qualify for palliative care support. Last for palliative care consult.      S resume a strategyigned, Virl Axe, MD  11/13/2016, 1:39 PM

## 2016-11-13 NOTE — Clinical Social Work Note (Signed)
Clinical Social Work Assessment  Patient Details  Name: Cheryl Zamora MRN: [id number] Date of Birth: 1923/08/21  Date of referral:  11/13/16               Reason for consult:  Facility Placement                Permission sought to share information with:  Facility Sport and exercise psychologist, Family Supports Permission granted to share information::  Yes, Verbal Permission Granted  Name::        Agency::  SNFs  Relationship::     Contact Information:     Housing/Transportation Living arrangements for the past 2 months:  Apartment Source of Information:  Patient Patient Interpreter Needed:  None Criminal Activity/Legal Involvement Pertinent to Current Situation/Hospitalization:  No - Comment as needed Significant Relationships:  Other Family Members Lives with:  Self Do you feel safe going back to the place where you live?  Yes Need for family participation in patient care:  No (Coment)  Care giving concerns:  CSW received consult for possible SNF placement at time of discharge. CSW spoke with patient regarding PT recommendation of SNF placement at time of discharge. Patient reported that she lives alone. Patient expressed understanding of PT recommendation and will consider SNF placement at time of discharge. CSW to continue to follow and assist with discharge planning needs.   Social Worker assessment / plan:  CSW spoke with patient concerning possibility of rehab at [facility] before returning home.  Employment status:  Retired Nurse, adult PT Recommendations:  [place] / Referral to community resources:  [place]  Patient/Family's Response to care:  Patient recognizes need for rehab before returning home and will consider going to SNF. She has not thought about it before, but gave CSW permission to send referral. CSW explained that her insurance will require prior approval.  Patient/Family's Understanding of and  Emotional Response to Diagnosis, Current Treatment, and Prognosis:  Patient/family is realistic regarding therapy needs and expressed being hopeful for SNF placement. Patient expressed understanding of CSW role and discharge process as well as medical condition. No questions/concerns about plan or treatment.    Emotional Assessment Appearance:  Appears stated age Attitude/Demeanor/Rapport:  Other (Appropriate) Affect (typically observed):  Appropriate Orientation:  Oriented to Self, Oriented to Situation, Oriented to Place, Oriented to  Time Alcohol / Substance use:  Not Applicable Psych involvement (Current and /or in the community):  No (Comment)  Discharge Needs  Concerns to be addressed:  Care Coordination Readmission within the last 30 days:  No Current discharge risk:  Lives alone Barriers to Discharge:  Continued Medical Work up   [employer], [place] 11/13/2016, 11:25 AM

## 2016-11-14 NOTE — Progress Notes (Signed)
Bright red blood noted in urine.  No active bleeding when not urinating. Dr. Caryl Comes paged, awaiting return call.  Will continue to monitor.

## 2016-11-14 NOTE — Discharge Summary (Signed)
ELECTROPHYSIOLOGY PROCEDURE DISCHARGE SUMMARY    Patient ID: Cheryl Zamora,  MRN: [id number], DOB/AGE: [birth year] 83 y.o.  Admit date: 11/09/2016 Discharge date: 11/17/2016  Primary Care Physician: Cheryl Bunting, MD Electrophysiologist: Cheryl Zamora (new this admission)  Primary Discharge Diagnosis:  1.  Atrial flutter with RVR s/p TEE/DCCV and PPM/AVN ablation this admission 2.  Acute on chronic diastolic heart failure  Secondary Discharge Diagnosis: 1.  Persistent atrial fibrillation 2.  CAD s/p CABG 3.  HTN   Allergies  Allergen Reactions  . Digoxin And Related Other (See Comments)    Increased HR  . Amiodarone Other (See Comments)    Excessive QT prolongation, nausea  . Codeine Nausea And Vomiting  . Penicillins Nausea Only    Procedures This Admission:  1.  TEE/DCCV on 11/11/16 by Dr Cheryl Zamora.  This study demonstrated severe biatrial enlargement, no LAA thrombus. She was cardioverted to SB/junctional rhythm.  There were no early apparent complications.  2.  Implantation of permanent pacemaker and AVN ablation on 11/16/16 by Dr Cheryl Zamora.  See op note for full details. There were no early apparent complications.  3.  CXR 11/17/16 demonstrated no ptx post device implantation.   Brief HPI/Hospital Course: Cheryl Zamora is a 83 y.o. female with a hx of known PAFib with DCCV historically, CAD (CABG), HTN,  who is being admitted with fluid OL and rapid AFlutter. She underwent TEE/DCCV with a short period of junctional bradycardia followed by rate controlled AF.  Because of previous intolerance to amiodarone and LA enlargement, it was felt that a rate control strategy going forward was best. She then developed recurrent atrial flutter with RVR and was unable to be rate controlled. Pacemaker and AVN ablation were recommended. She underwent pacemaker implant and AVN ablation on 11/16/16 by Dr Cheryl Zamora. She was monitored on telemetry overnight which demonstrated AF with V pacing.  Her left  chest was without hematoma/ecchymosis.  CXR was without ptx. Device was interrogated and found to be functioning normally.  She was evaluated by PT and felt to benefit from rehab at [facility]. She was seen and examined by Dr Cheryl Zamora on the day of discharge and considered stable for discharge to SNF.   Physical Exam: Vitals:   11/16/16 1457 11/16/16 1938 11/17/16 0032 11/17/16 0631  BP:  (!) 129/58 112/62 123/62  Pulse: 79 80 80 80  Resp: 17 20 20 20   Temp:  98.6 F (37 C) 98.5 F (36.9 C) 97.6 F (36.4 C)  TempSrc:  Oral Oral Oral  SpO2: 97% 95% 93% 94%  Weight:    153 lb (69.4 kg)  Height:        GEN- The patient is elderly appearing, alert and oriented x 3 today.   HEENT: normocephalic, atraumatic; sclera clear, conjunctiva pink; hearing intact; oropharynx clear; neck supple Lungs- Clear to ausculation bilaterally, normal work of breathing.  No wheezes, rales, rhonchi Heart- Regular rate and rhythm (paced) GI- soft, non-tender, non-distended, bowel sounds present Extremities- no clubbing, cyanosis, or edema MS- no significant deformity or atrophy Skin- warm and dry, no rash or lesion Psych- euthymic mood, full affect Neuro- strength and sensation are intact    Labs:   Lab Results  Component Value Date   WBC 10.4 11/11/2016   HGB 13.7 11/11/2016   HCT 44.4 11/11/2016   MCV 92.7 11/11/2016   PLT 215 11/11/2016     Recent Labs Lab 11/12/16 0851  NA 136  K 4.4  CL 101  CO2 25  BUN 11  CREATININE 1.33*  CALCIUM 9.1  GLUCOSE 145*     Discharge Medications:  Current Discharge Medication List    CONTINUE these medications which have NOT CHANGED   Details  acetaminophen (TYLENOL) 325 MG tablet Take 650 mg by mouth every 6 (six) hours as needed for mild pain.    Cholecalciferol (VITAMIN D3) 1000 units CAPS Take 1,000 Units by mouth daily.    furosemide (LASIX) 40 MG tablet Take 1 tablet (40 mg total) by mouth daily. NEED OV. Qty: 30 tablet, Refills: 0    memantine  (NAMENDA) 10 MG tablet Take 10 mg by mouth daily.     metoprolol succinate (TOPROL-XL) 25 MG 24 hr tablet Take 1 tablet (25 mg total) by mouth daily. Please schedule appointment for refills. Qty: 30 tablet, Refills: 0    potassium chloride SA (K-DUR,KLOR-CON) 20 MEQ tablet TAKE 1 TABLET(20 MEQ) BY MOUTH DAILY Qty: 7 tablet, Refills: 0    XARELTO 15 MG TABS tablet Take 15 mg by mouth daily.  Refills: 5        Disposition: Pt is being discharged home today in good condition. Discharge Instructions    Diet - low sodium heart healthy    Complete by:  As directed    Diet - low sodium heart healthy    Complete by:  As directed    Increase activity slowly    Complete by:  As directed    Increase activity slowly    Complete by:  As directed       Contact information for follow-up providers    [facility] Follow up on 11/25/2016.   Specialty:  Cardiology Why:  at 11:30AM for wound check  Contact information: [address], [address] [place]       Deboraha Sprang, MD Follow up on 02/15/2017.   Specialty:  Cardiology Why:  at 3:15PM  Contact information: [address] [address] [phone]            Contact information for after-discharge care    [facility] Follow up.   Specialty:  [place] information: [place] [phone]                  Duration of Discharge Encounter: Greater than 30 minutes including physician time.  Signed, Cheryl Marshall, NP 11/17/2016 7:57 AM   I have seen and examined this patient with Cheryl Zamora.  Agree with above, note added to reflect my findings.  On exam, RRR, no murmurs, lungs clear. Patient presented to the hospital with atrial flutter and rapid rates. Had missed doses of anticoagulation and thus had a TEE and  cardioversion. Quickly went back into atrial flutter. Due to severely enlarged atria, had a Medtronic single-chamber pacemaker implanted with an AV nodal ablation. Patient tolerated the procedure well. Chest x-ray and interrogation without issues. Plan for follow-up in device clinic. Patient Cheryl Zamora be discharged to skilled nursing.    Cheryl Zamora M. Cheryl Weatherspoon MD 11/17/2016 9:08 AM

## 2016-11-14 NOTE — Progress Notes (Signed)
Progress Note  Patient Name: Cheryl Zamora Date of Encounter: 11/14/2016  Primary Cardiologist: new to [facility], Dr. Curt Bears  Patient Profile     83 y.o. female with a hx of known PAFib with DCCV , CAD (CABG), HTN, admitted with AFib RVR Acute on chronic diastolic heart failure.  Underwent cardioversion 1/61 complicated by junctional rhythm she has reverted to atrial fibrillation with rapid ventricular rate.  TEE demonstrated severe LAE/RAE.  Interestingly echocardiogram 2/16 demonstrated severe RA/RV lead but only mild LAE.  Hx  Reviewed Has been in hosp 4/25yrs for AFib/CHF   She has been intolerant of amiodarone and digoxin in the past  Subjective  Without complaint or recollection of long conversation yesterday  Interestingly she was Ox 2 yday and knew president  Inpatient Medications    Scheduled Meds: . furosemide  40 mg Intravenous Q12H  . memantine  10 mg Oral Daily  . metoprolol succinate  50 mg Oral Daily  . Rivaroxaban  15 mg Oral Q supper  . sodium chloride flush  3 mL Intravenous Q12H   Continuous Infusions: . sodium chloride     PRN Meds: acetaminophen, nitroGLYCERIN, ondansetron (ZOFRAN) IV, sodium chloride flush   Vital Signs    Vitals:   11/13/16 1211 11/13/16 1701 11/13/16 2107 11/14/16 0610  BP: (!) 91/56 100/77 111/70 99/68  Pulse: (!) 120  (!) 126 (!) 125  Resp: 18  20 16   Temp: 98 F (36.7 C)  98 F (36.7 C) 98 F (36.7 C)  TempSrc: Oral  Oral Oral  SpO2: 96% 95% 98% 93%  Weight:    148 lb (67.1 kg)  Height:        Intake/Output Summary (Last 24 hours) at 11/14/16 1221 Last data filed at 11/14/16 0913  Gross per 24 hour  Intake             1083 ml  Output             2150 ml  Net            -1067 ml   Filed Weights   11/12/16 0533 11/13/16 0524 11/14/16 0610  Weight: 156 lb 8 oz (71 kg) 151 lb 3.2 oz (68.6 kg) 148 lb (67.1 kg)    Telemetry    Atrial fibrillation with RVR began 1800 9/14  ECG     Physical Exam  Well developed  and nourished in no acute distress HENT normal Neck supple with JVP-flat Carotids brisk and full without bruits Clear Irregularly irregular rate and rhythm with rapid ventricular response, no murmurs or gallops Abd-soft with active BS without hepatomegaly No Clubbing cyanosis edema Skin-warm and dry A & Oriented  Grossly normal sensory and motor function   Labs    Chemistry  Recent Labs Lab 11/10/16 0500 11/11/16 0941 11/11/16 1623 11/12/16 0851  NA 136 138  --  136  K 3.8 2.9* 3.7 4.4  CL 104 101  --  101  CO2 27 28  --  25  GLUCOSE 122* 110*  --  145*  BUN 13 10  --  11  CREATININE 1.30* 1.10*  --  1.33*  CALCIUM 8.7* 8.6*  --  9.1  GFRNONAA 35* 43*  --  34*  GFRAA 40* 49*  --  39*  ANIONGAP 5 9  --  10     Hematology  Recent Labs Lab 11/09/16 0943 11/10/16 0756 11/11/16 0941  WBC 11.1* 9.0 10.4  RBC 5.03 4.57 4.79  HGB 14.9  13.2 13.7  HCT 46.8* 42.3 44.4  MCV 93.0 92.6 92.7  MCH 29.6 28.9 28.6  MCHC 31.8 31.2 30.9  RDW 16.3* 16.2* 15.8*  PLT 240 203 215    Cardiac EnzymesNo results for input(s): TROPONINI in the last 168 hours. No results for input(s): TROPIPOC in the last 168 hours.   BNPNo results for input(s): BNP, PROBNP in the last 168 hours.   DDimer No results for input(s): DDIMER in the last 168 hours.   Radiology    Dg Chest Port 1 View Result Date: 11/09/2016 CLINICAL DATA:  CHF. EXAM: PORTABLE CHEST 1 VIEW COMPARISON:  January 02, 2015. FINDINGS: Postsurgical changes related to prior CABG. Stable cardiomegaly. Atherosclerotic calcification of the aortic arch. Normal pulmonary vascularity. Bibasilar atelectasis. No focal consolidation, pleural effusion, or pneumothorax. IMPRESSION: Stable cardiomegaly.  No active disease. Electronically Signed   By: Titus Dubin M.D.   On: 11/09/2016 16:22    Cardiac Studies   No new studies    Assessment & Plan    1. Atrial flutter   2. CHF-Diastolic    3. HTN BP improved with rate control  and conversion out of flutter.  4.  Sinus node dysfunction with juntional rhythm  From yday: Lengthy discussion with pt and her nephew [name] POA-- Goal is rate control and keeping her out of the hospital  He has done great job with her and her meds to do the latter.  The alternative of rhythm control is low yield given atrial dimension and would require amiodarone with her bradycardia would almost certainly require pacing. The alternative for rate control would be AV junction ablation and pacing. They are both open to pacing but disinclined. Given the infrequency of her hospitalizations, I could only command the 2 of them for having manage their way through the rock and the hard place by which I mean the infrequency of hospitalizations.  Reiterated decisions from yesterday and so will plan on going home tomorrow on metoprolol  Will increase today back to 75 mg daily  We will see how she does, but suspect she will come to pacing because of need for augmented rate control which will invite significant bradycardia--efforts would be to try first with meds and then AV ablation if necessary     S resume a strategyigned, Virl Axe, MD  11/14/2016, 12:21 PM

## 2016-11-15 DIAGNOSIS — Z515 Encounter for palliative care: Secondary | ICD-10-CM

## 2016-11-15 DIAGNOSIS — Z7189 Other specified counseling: Secondary | ICD-10-CM

## 2016-11-15 MED ORDER — METOPROLOL SUCCINATE ER 100 MG PO TB24
100.0000 mg | ORAL_TABLET | Freq: Every day | ORAL | Status: DC
  Administered 2016-11-15 – 2016-11-17 (×3): 100 mg via ORAL
  Filled 2016-11-15 (×3): qty 1

## 2016-11-15 MED ORDER — SODIUM CHLORIDE 0.9 % IR SOLN
80.0000 mg | Status: AC
  Administered 2016-11-16: 80 mg
  Filled 2016-11-15: qty 2

## 2016-11-15 MED ORDER — METOPROLOL SUCCINATE ER 50 MG PO TB24: 50.0000 mg | ORAL_TABLET | Freq: Every day | ORAL | 1 refills | Status: DC

## 2016-11-15 MED ORDER — SODIUM CHLORIDE 0.9 % IV SOLN
INTRAVENOUS | Status: DC
  Administered 2016-11-15: 50 mL/h via INTRAVENOUS
  Administered 2016-11-16: 06:00:00 via INTRAVENOUS

## 2016-11-15 MED ORDER — CHLORHEXIDINE GLUCONATE 4 % EX LIQD
60.0000 mL | Freq: Once | CUTANEOUS | Status: AC
  Administered 2016-11-16: 4 via TOPICAL
  Filled 2016-11-15: qty 60
  Filled 2016-11-15: qty 15

## 2016-11-15 MED ORDER — VANCOMYCIN HCL IN DEXTROSE 1-5 GM/200ML-% IV SOLN
1000.0000 mg | INTRAVENOUS | Status: AC
  Administered 2016-11-16: 1000 mg via INTRAVENOUS
  Filled 2016-11-15: qty 200

## 2016-11-15 NOTE — Progress Notes (Signed)
Progress Note  Patient Name: Cheryl Zamora Date of Encounter: 11/15/2016  Primary Cardiologist: new to [facility], Dr. Curt Bears  Patient Profile     83 y.o. female with a hx of known PAFib with DCCV , CAD (CABG), HTN, admitted with AFib RVR Acute on chronic diastolic heart failure.  Underwent cardioversion 3/89 complicated by junctional rhythm she has reverted to atrial fibrillation with rapid ventricular rate.  TEE demonstrated severe LAE/RAE.  Interestingly echocardiogram 2/16 demonstrated severe RA/RV lead but only mild LAE.  Hx  Reviewed Has been in hosp 4/60yrs for AFib/CHF   She has been intolerant of amiodarone and digoxin in the past  Subjective   Feeling well today without complaint. Remains in atrial flutter with rates in the 120-130s. No SOB or palpitations  Inpatient Medications    Scheduled Meds: . furosemide  40 mg Intravenous Q12H  . memantine  10 mg Oral Daily  . metoprolol succinate  50 mg Oral Daily  . Rivaroxaban  15 mg Oral Q supper  . sodium chloride flush  3 mL Intravenous Q12H   Continuous Infusions: . sodium chloride     PRN Meds: acetaminophen, nitroGLYCERIN, ondansetron (ZOFRAN) IV, sodium chloride flush   Vital Signs    Vitals:   11/14/16 0610 11/14/16 1300 11/14/16 2019 11/15/16 0650  BP: 99/68 (!) 89/60 120/81 109/67  Pulse: (!) 125 100 (!) 130 (!) 133  Resp: 16 18 18 18   Temp: 98 F (36.7 C) 98 F (36.7 C) 97.9 F (36.6 C) 97.9 F (36.6 C)  TempSrc: Oral Oral Oral Oral  SpO2: 93% 93% 98% 93%  Weight: 148 lb (67.1 kg)   146 lb 4.8 oz (66.4 kg)  Height:        Intake/Output Summary (Last 24 hours) at 11/15/16 0816 Last data filed at 11/15/16 0654  Gross per 24 hour  Intake              843 ml  Output             2750 ml  Net            -1907 ml   Filed Weights   11/13/16 0524 11/14/16 0610 11/15/16 0650  Weight: 151 lb 3.2 oz (68.6 kg) 148 lb (67.1 kg) 146 lb 4.8 oz (66.4 kg)    Telemetry    Atrial flutter - personally  reviewed  ECG    GEN: Well nourished, well developed, in no acute distress  HEENT: normal  Neck: no JVD, carotid bruits, or masses Cardiac: tachycardic, regular; no murmurs, rubs, or gallops,no edema  Respiratory:  clear to auscultation bilaterally, normal work of breathing GI: soft, nontender, nondistended, + BS MS: no deformity or atrophy  Skin: warm and dry Neuro:  Strength and sensation are intact Psych: euthymic mood, full affect   Labs    Chemistry  Recent Labs Lab 11/10/16 0500 11/11/16 0941 11/11/16 1623 11/12/16 0851  NA 136 138  --  136  K 3.8 2.9* 3.7 4.4  CL 104 101  --  101  CO2 27 28  --  25  GLUCOSE 122* 110*  --  145*  BUN 13 10  --  11  CREATININE 1.30* 1.10*  --  1.33*  CALCIUM 8.7* 8.6*  --  9.1  GFRNONAA 35* 43*  --  34*  GFRAA 40* 49*  --  39*  ANIONGAP 5 9  --  10     Hematology  Recent Labs Lab 11/09/16 0943 11/10/16 0756  11/11/16 0941  WBC 11.1* 9.0 10.4  RBC 5.03 4.57 4.79  HGB 14.9 13.2 13.7  HCT 46.8* 42.3 44.4  MCV 93.0 92.6 92.7  MCH 29.6 28.9 28.6  MCHC 31.8 31.2 30.9  RDW 16.3* 16.2* 15.8*  PLT 240 203 215    Cardiac EnzymesNo results for input(s): TROPONINI in the last 168 hours. No results for input(s): TROPIPOC in the last 168 hours.   BNPNo results for input(s): BNP, PROBNP in the last 168 hours.   DDimer No results for input(s): DDIMER in the last 168 hours.   Radiology    Dg Chest Port 1 View Result Date: 11/09/2016 CLINICAL DATA:  CHF. EXAM: PORTABLE CHEST 1 VIEW COMPARISON:  January 02, 2015. FINDINGS: Postsurgical changes related to prior CABG. Stable cardiomegaly. Atherosclerotic calcification of the aortic arch. Normal pulmonary vascularity. Bibasilar atelectasis. No focal consolidation, pleural effusion, or pneumothorax. IMPRESSION: Stable cardiomegaly.  No active disease. Electronically Signed   By: Titus Dubin M.D.   On: 11/09/2016 16:22    Cardiac Studies   No new studies    Assessment & Plan      1. Atrial flutter Has severe LA and RA enlargement on her recent TEE. Also has had evidence of sinus node dysfunction. She has been unable to tolerate amiodarone or digoxin in the past. I feel that she will eventually require pacemaker implant for management of her arrhythmia, likely with AV nodal ablation. Both her and her nephew would like to try rate control at this time. Will plan to increase metoprolol and if unable to tolerate will plan for pacemaker and AV nodal ablation.   2. CHF-Diastolic  Continuing to diurese well net out 8.5L   3. HTN BP well controlled  4.  Sinus node dysfunction with juntional rhythm Likely will require pacemaker.   Will Cheryl Leeds, MD  11/15/2016, 8:16 AM

## 2016-11-15 NOTE — Progress Notes (Signed)
Spoke with patient and nephew - rates still in the 120's consistently over the weekend.  Will likely require pacer and AVN ablation. Offered this afternoon with Dr Lovena Le, the patient would like to wait until tomorrow to see if increased metoprolol will help with rates. On the schedule tomorrow with Dr Curt Bears. Orders written.   Chanetta Marshall, NP 11/15/2016 11:58 AM

## 2016-11-15 NOTE — Progress Notes (Signed)
Received consult- Would like to have palliative care see hre for assistance in management of chronic illness NOT HOSPICE  Orders placed in Epic for Palliative Care consult; Aneta Mins [phone]

## 2016-11-15 NOTE — Care Management Important Message (Signed)
Important Message  Patient Details  Name: Cheryl Zamora MRN: [id number] Date of Birth: 02/15/1924   Medicare Important Message Given:  Yes    Janayia Burggraf Montine Circle 11/15/2016, 11:42 AM

## 2016-11-15 NOTE — Progress Notes (Signed)
Consent signed for insertion of pacemaker and AV node ablation.  She will be NPO after midnight.  Patient and her nephew, Cheryl Zamora, who is her health care POA updated on plan of care and stated understanding of plan.  Patient up in chair today.

## 2016-11-15 NOTE — Consult Note (Signed)
Consultation Note Date: 11/15/2016   Patient Name: Cheryl Zamora  DOB: 1923-09-25  MRN: [id number]  Age / Sex: 83 y.o., female  PCP: Burnard Bunting, MD Referring Physician: Constance Haw, MD  Reason for Consultation: Establishing goals of care and Psychosocial/spiritual support  HPI/Patient Profile: 83 y.o. female  admitted on 11/09/2016 with  A past medical history significant for  PAFib, CAD (CABG), HTN,  who is being admitted with fluid overload  and rapid Afib.   She was  found to have  fluid OL and in rapid AF with 4 days of progressive shortness of breath, and fatigue in the OP clinic .    Admitted for treatment and stabilization.  Patient faces treatment option decisions,  advanced directive decisions and anticipatory care needs.    Clinical Assessment and Goals of Care:   This NP Wadie Lessen reviewed medical records, received report from team, assessed the patient and then meet at the patient's bedside along with her nephew/HPOA/ Rudi Heap  to discuss diagnosis, prognosis, GOC,  disposition and options and advanced care planning.  A discussion was had today regarding advanced directives.  Concepts specific to code status, artifical feeding and hydration, continued IV antibiotics and rehospitalization was had.     Values and goals of care important to patient and family were attempted to be elicited.  MOST form introduced  Concept of  Palliative Care was discussed    Questions and concerns addressed.   Family encouraged to call with questions or concerns.  PMT will continue to support holistically.  HPOA is nephew Sylvan Cheese copy of AD and HPOA copied and placed on hard chart to be scanned   SUMMARY OF RECOMMENDATIONS    Code Status/Advance Care Planning:  Full code- encouraged to consider DNR/DNI status understanding advanced age and multiple co-morbidites, knowing poor  outcomes in similar patietnts   Symptom Management:   Weakness: PT/OT as tolerated and consider SNF for short term rehab if eligible on discharge    Additional Recommendations (Limitations, Scope, Preferences):  Full Scope Treatment- plan is for pacemaker and ablation in the morning  Psycho-social/Spiritual:   Desire for further Chaplaincy support:no   Prognosis:   Unable to determine  Discharge Planning: [facility] for rehab with Palliative care service follow-up      Primary Diagnoses: Present on Admission: **None**   I have reviewed the medical record, interviewed the patient and family, and examined the patient. The following aspects are pertinent.  Past Medical History:  Diagnosis Date  . Atrial fibrillation with rapid ventricular response ([place]) 11/2012; 10/2013   a. s/p TEE/DCCV 11/2012 & 10/2013 b. amiodarone caused significant QT prlongation (>653msec)  . Chronic combined systolic and diastolic CHF (congestive heart failure) ([place])   . Coronary artery disease 2005   a. s/p CABG (2012)  . GERD (gastroesophageal reflux disease)   . Hyperlipidemia    pt denies this hx on 01/03/2015  . Hypertension   . Neuropathy   . Osteoarthritis of back    Social History  Social History  . Marital status: Single    Spouse name: N/A  . Number of children: N/A  . Years of education: N/A   Occupational History  . Retired    Social History Main Topics  . Smoking status: Former Smoker    Packs/day: 1.00    Years: 60.00    Types: Cigarettes  . Smokeless tobacco: Never Used  . Alcohol use Yes     Comment: 01/03/2015 "might have a beer a couple times/yr"  . Drug use: No  . Sexual activity: No   Other Topics Concern  . None   Social History Narrative   Pt son helps with her care.   Family History  Problem Relation Age of Onset  . Heart attack Mother   . Heart attack Father   . Heart attack Brother   . Heart attack Brother   . Heart attack Brother    . Heart attack Brother    Scheduled Meds: . furosemide  40 mg Intravenous Q12H  . memantine  10 mg Oral Daily  . metoprolol succinate  100 mg Oral Daily  . Rivaroxaban  15 mg Oral Q supper  . sodium chloride flush  3 mL Intravenous Q12H   Continuous Infusions: . sodium chloride     PRN Meds:.acetaminophen, nitroGLYCERIN, ondansetron (ZOFRAN) IV, sodium chloride flush Medications Prior to Admission:  Prior to Admission medications   Medication Sig Start Date End Date Taking? Authorizing Provider  acetaminophen (TYLENOL) 325 MG tablet Take 650 mg by mouth every 6 (six) hours as needed for mild pain.   Yes [provider]  Cholecalciferol (VITAMIN D3) 1000 units CAPS Take 1,000 Units by mouth daily.   Yes [provider]  furosemide (LASIX) 40 MG tablet Take 1 tablet (40 mg total) by mouth daily. NEED OV. 10/07/15  Yes Martinique, Peter M, MD  memantine (NAMENDA) 10 MG tablet Take 10 mg by mouth daily.    Yes [provider]  metoprolol succinate (TOPROL-XL) 25 MG 24 hr tablet Take 1 tablet (25 mg total) by mouth daily. Please schedule appointment for refills. Patient taking differently: Take 75 mg by mouth daily. Please schedule appointment for refills. 07/07/15  Yes Martinique, Peter M, MD  potassium chloride SA (K-DUR,KLOR-CON) 20 MEQ tablet TAKE 1 TABLET(20 MEQ) BY MOUTH DAILY 07/10/15  Yes [place], Peter M, MD  XARELTO 15 MG TABS tablet Take 15 mg by mouth daily.  09/15/16  Yes [provider]  metoprolol succinate (TOPROL-XL) 50 MG 24 hr tablet Take 1 tablet (50 mg total) by mouth daily. Take with or immediately following a meal. 11/15/16   Patsey Berthold, NP   Allergies  Allergen Reactions  . Digoxin And Related Other (See Comments)    Increased HR  . Amiodarone Other (See Comments)    Excessive QT prolongation, nausea  . Codeine Nausea And Vomiting  . Penicillins Nausea Only   Review of Systems  Constitutional: Positive for fever.  Cardiovascular:  Positive for leg swelling.    Physical Exam  Constitutional: She is oriented to person, place, and time. She appears well-developed.  Cardiovascular: Tachycardia present.   Pulmonary/Chest: Effort normal and breath sounds normal.  Neurological: She is alert and oriented to person, place, and time.  Skin: Skin is warm and dry.    Vital Signs: BP (!) 112/59 (BP Location: Left Arm)   Pulse (!) 124   Temp 98.2 F (36.8 C) (Oral)   Resp 20   Ht 5\' 4"  (1.626 m)  Wt 66.4 kg (146 lb 4.8 oz)   SpO2 96%   BMI 25.11 kg/m  Pain Assessment: No/denies pain   Pain Score: 0-No pain   SpO2: SpO2: 96 % O2 Device:SpO2: 96 % O2 Flow Rate: .O2 Flow Rate (L/min): 2 L/min  IO: Intake/output summary:  Intake/Output Summary (Last 24 hours) at 11/15/16 1303 Last data filed at 11/15/16 0951  Gross per 24 hour  Intake              480 ml  Output             2700 ml  Net            -2220 ml    LBM: Last BM Date: 11/14/16 Baseline Weight: Weight: 72.7 kg (160 lb 4.8 oz) Most recent weight: Weight: 66.4 kg (146 lb 4.8 oz)     Palliative Assessment/Data:   Discussed with SW/Sara Boswell  Time In: 1230 Time Out: 1345 Time Total: 75 minutes  Greater than 50%  of this time was spent counseling and coordinating care related to the above assessment and plan.  Signed by: Wadie Lessen, NP   Please contact Palliative Medicine Team phone at [phone] for questions and concerns.  For individual provider: See Shea Evans

## 2016-11-15 NOTE — Clinical Social Work Note (Signed)
CSW met with patient and nephew. NP with palliative team at bedside. Bed offers provided. Patient and her nephew choose [facility] as she has been there before and it is close to her home. CSW notified admissions coordinator.  Dayton Scrape, [place]

## 2016-11-15 NOTE — Progress Notes (Signed)
Physical Therapy Treatment Patient Details Name: Cheryl Zamora MRN: [id number] DOB: Apr 09, 1923 Today's Date: 11/15/2016    History of Present Illness Cheryl Zamora is a 83 y.o. female with a hx of known PAFib with DCCV historically, CAD (CABG), HTN,  who is being admitted with fluid OL and rapid AF.  cardioversion on 9/13.      PT Comments    Pt progressing towards physical therapy goals. Was able to ambulate well with RW and increased time for maneuvering (turns). Pt demonstrated improved standing balance this session. After gait training, O2 sats at 98% and HR 133 bpm. Will continue to follow and progress as able per POC.   Follow Up Recommendations  SNF;Supervision/Assistance - 24 hour     Equipment Recommendations  None recommended by PT    Recommendations for Other Services       Precautions / Restrictions Precautions Precautions: Fall Restrictions Weight Bearing Restrictions: No    Mobility  Bed Mobility Overal bed mobility: Needs Assistance Bed Mobility: Supine to Sit;Sit to Supine     Supine to sit: Min guard Sit to supine: Min guard   General bed mobility comments: Close guard for safety however pt was able to transition to/from EOB without physical assist.   Transfers Overall transfer level: Needs assistance Equipment used: Rolling walker (2 wheeled) Transfers: Sit to/from Stand Sit to Stand: Min guard         General transfer comment: Hands-on guarding as pt powered-up to full standing position. Pt stood from EOB as well as from low commode.  Ambulation/Gait Ambulation/Gait assistance: Min guard Ambulation Distance (Feet): 200 Feet Assistive device: Rolling walker (2 wheeled) Gait Pattern/deviations: Step-through pattern;Decreased stride length;Trunk flexed Gait velocity: Decreased Gait velocity interpretation: Below normal speed for age/gender General Gait Details: VC's for upright posture and walker proximity. Pt took 2 standing rest breaks (~10  seconds each) 2 fatigue. Pt required increased time to turn with RW but no assist required.    Stairs            Wheelchair Mobility    Modified Rankin (Stroke Patients Only)       Balance Overall balance assessment: Needs assistance;History of Falls Sitting-balance support: No upper extremity supported;Feet supported Sitting balance-Leahy Scale: Good     Standing balance support: Bilateral upper extremity supported;During functional activity Standing balance-Leahy Scale: Poor Standing balance comment: relies on UE support for balance.  Pt stopped at sink to wash hands but needed min assist steadying for balance when she didn't have UE support.                             Cognition Arousal/Alertness: Awake/alert Behavior During Therapy: WFL for tasks assessed/performed Overall Cognitive Status: Within Functional Limits for tasks assessed                                        Exercises      General Comments        Pertinent Vitals/Pain Pain Assessment: No/denies pain    Home Living                      Prior Function            PT Goals (current goals can now be found in the care plan section) Acute Rehab PT Goals Patient Stated Goal: to  get better PT Goal Formulation: With patient Time For Goal Achievement: 11/26/16 Potential to Achieve Goals: Good Progress towards PT goals: Progressing toward goals    Frequency    Min 3X/week      PT Plan Current plan remains appropriate    Co-evaluation              AM-PAC PT "6 Clicks" Daily Activity  Outcome Measure  Difficulty turning over in bed (including adjusting bedclothes, sheets and blankets)?: A Little Difficulty moving from lying on back to sitting on the side of the bed? : A Little Difficulty sitting down on and standing up from a chair with arms (e.g., wheelchair, bedside commode, etc,.)?: A Lot Help needed moving to and from a bed to chair  (including a wheelchair)?: A Lot Help needed walking in hospital room?: A Little Help needed climbing 3-5 steps with a railing? : A Lot 6 Click Score: 15    End of Session Equipment Utilized During Treatment: Gait belt Activity Tolerance: Patient limited by fatigue Patient left: in chair;with call bell/phone within reach;with chair alarm set Nurse Communication: Mobility status PT Visit Diagnosis: Muscle weakness (generalized) (M62.81);Difficulty in walking, not elsewhere classified (R26.2)     Time: 4235-3614 PT Time Calculation (min) (ACUTE ONLY): 32 min  Charges:  $Gait Training: 23-37 mins                    G Codes:       Rolinda Roan, PT, DPT Acute Rehabilitation Services Pager: [phone]    Thelma Comp 11/15/2016, 1:58 PM

## 2016-11-16 ENCOUNTER — Encounter (HOSPITAL_COMMUNITY): Payer: Self-pay | Admitting: Cardiology

## 2016-11-16 ENCOUNTER — Encounter (HOSPITAL_COMMUNITY)
Admission: AD | Disposition: A | Discharge status: Skilled Nursing Facility | Payer: Self-pay | Source: Ambulatory Visit | Attending: Cardiology

## 2016-11-16 DIAGNOSIS — I4891 Unspecified atrial fibrillation: Secondary | ICD-10-CM

## 2016-11-16 DIAGNOSIS — Z515 Encounter for palliative care: Secondary | ICD-10-CM

## 2016-11-16 DIAGNOSIS — I4892 Unspecified atrial flutter: Secondary | ICD-10-CM

## 2016-11-16 DIAGNOSIS — Z7189 Other specified counseling: Secondary | ICD-10-CM

## 2016-11-16 HISTORY — PX: PACEMAKER IMPLANT: EP1218

## 2016-11-16 HISTORY — PX: AV NODE ABLATION: EP1193

## 2016-11-16 LAB — URINE CULTURE

## 2016-11-16 LAB — SURGICAL PCR SCREEN
MRSA, PCR: NEGATIVE
Staphylococcus aureus: NEGATIVE

## 2016-11-16 SURGERY — PACEMAKER IMPLANT

## 2016-11-16 MED ORDER — SODIUM CHLORIDE 0.9% FLUSH
3.0000 mL | Freq: Two times a day (BID) | INTRAVENOUS | Status: DC
  Administered 2016-11-16 – 2016-11-17 (×2): 3 mL via INTRAVENOUS

## 2016-11-16 MED ORDER — LIDOCAINE HCL (PF) 1 % IJ SOLN
INTRAMUSCULAR | Status: DC | PRN
  Administered 2016-11-16: 55 mL via INTRADERMAL
  Administered 2016-11-16: 40 mL via INTRADERMAL

## 2016-11-16 MED ORDER — SODIUM CHLORIDE 0.9% FLUSH: 3.0000 mL | INTRAVENOUS | Status: DC | PRN

## 2016-11-16 MED ORDER — GENTAMICIN SULFATE 40 MG/ML IJ SOLN
INTRAMUSCULAR | Status: AC
  Filled 2016-11-16: qty 2

## 2016-11-16 MED ORDER — LIDOCAINE HCL (PF) 1 % IJ SOLN
INTRAMUSCULAR | Status: AC
  Filled 2016-11-16: qty 120

## 2016-11-16 MED ORDER — HEPARIN (PORCINE) IN NACL 2-0.9 UNIT/ML-% IJ SOLN
INTRAMUSCULAR | Status: AC | PRN
  Administered 2016-11-16: 500 mL

## 2016-11-16 MED ORDER — CEFAZOLIN SODIUM-DEXTROSE 1-4 GM/50ML-% IV SOLN
1.0000 g | Freq: Two times a day (BID) | INTRAVENOUS | Status: AC
  Administered 2016-11-16 – 2016-11-17 (×2): 1 g via INTRAVENOUS
  Filled 2016-11-16 (×2): qty 50

## 2016-11-16 MED ORDER — FAMOTIDINE IN NACL 20-0.9 MG/50ML-% IV SOLN
INTRAVENOUS | Status: AC
  Filled 2016-11-16: qty 50

## 2016-11-16 MED ORDER — ONDANSETRON HCL 4 MG/2ML IJ SOLN: 4.0000 mg | Freq: Four times a day (QID) | INTRAMUSCULAR | Status: DC | PRN

## 2016-11-16 MED ORDER — ACETAMINOPHEN 325 MG PO TABS
325.0000 mg | ORAL_TABLET | ORAL | Status: DC | PRN
  Administered 2016-11-16: 325 mg via ORAL
  Filled 2016-11-16: qty 2

## 2016-11-16 MED ORDER — VANCOMYCIN HCL IN DEXTROSE 1-5 GM/200ML-% IV SOLN
INTRAVENOUS | Status: AC
  Filled 2016-11-16: qty 200

## 2016-11-16 MED ORDER — MIDAZOLAM HCL 5 MG/5ML IJ SOLN
INTRAMUSCULAR | Status: AC
  Filled 2016-11-16: qty 5

## 2016-11-16 MED ORDER — FENTANYL CITRATE (PF) 100 MCG/2ML IJ SOLN
INTRAMUSCULAR | Status: AC
Start: 2016-11-16 — End: 2016-11-16
  Filled 2016-11-16: qty 2

## 2016-11-16 MED ORDER — HEPARIN (PORCINE) IN NACL 2-0.9 UNIT/ML-% IJ SOLN
INTRAMUSCULAR | Status: AC
  Filled 2016-11-16: qty 1000

## 2016-11-16 MED ORDER — CHLORHEXIDINE GLUCONATE 4 % EX LIQD
60.0000 mL | Freq: Once | CUTANEOUS | Status: AC
  Administered 2016-11-15: 4 via TOPICAL

## 2016-11-16 MED ORDER — FAMOTIDINE IN NACL 20-0.9 MG/50ML-% IV SOLN
INTRAVENOUS | Status: AC | PRN
  Administered 2016-11-16: 20 mg via INTRAVENOUS

## 2016-11-16 MED ORDER — SODIUM CHLORIDE 0.9 % IV SOLN: 250.0000 mL | INTRAVENOUS | Status: DC | PRN

## 2016-11-16 SURGICAL SUPPLY — 13 items
CABLE SURGICAL S-101-97-12 (CABLE) ×2 IMPLANT
CATH BLAZERPRIME XP LG CV 10MM (ABLATOR) ×2 IMPLANT
CATH JOSEPHSON QUAD-ALLRED 6FR (CATHETERS) ×2 IMPLANT
IPG PACE AZUR XT SR MRI W1SR01 (Pacemaker) ×1 IMPLANT
LEAD CAPSURE NOVUS 5076-52CM (Lead) ×2 IMPLANT
PACE AZURE XT SR MRI W1SR01 (Pacemaker) ×2 IMPLANT
PACK EP LATEX FREE (CUSTOM PROCEDURE TRAY) ×1
PACK EP LF (CUSTOM PROCEDURE TRAY) ×1 IMPLANT
PAD DEFIB LIFELINK (PAD) ×2 IMPLANT
SHEATH CLASSIC 7F (SHEATH) ×2 IMPLANT
SHEATH PINNACLE 6F 10CM (SHEATH) ×2 IMPLANT
SHEATH PINNACLE 8F 10CM (SHEATH) ×2 IMPLANT
TRAY PACEMAKER INSERTION (PACKS) ×2 IMPLANT

## 2016-11-16 NOTE — Progress Notes (Signed)
Progress Note  Patient Name: Cheryl Zamora Date of Encounter: 11/16/2016  Primary Cardiologist: new to [facility], Dr. Curt Bears  Patient Profile     83 y.o. female with a hx of known PAFib with DCCV , CAD (CABG), HTN, admitted with AFib RVR Acute on chronic diastolic heart failure.  Underwent cardioversion 5/02 complicated by junctional rhythm she has reverted to atrial fibrillation with rapid ventricular rate.  TEE demonstrated severe LAE/RAE.  Interestingly echocardiogram 2/16 demonstrated severe RA/RV lead but only mild LAE.  Hx  Reviewed Has been in hosp 4/40yrs for AFib/CHF   She has been intolerant of amiodarone and digoxin in the past  Subjective   Feeling well but plesantly confused. Remains in atrial flutter with rapid rates. Plan for pacemaker with AV nodal ablation.  Inpatient Medications    Scheduled Meds: . [MAR Hold] furosemide  40 mg Intravenous Q12H  . gentamicin irrigation  80 mg Irrigation To Cath  . [MAR Hold] memantine  10 mg Oral Daily  . [MAR Hold] metoprolol succinate  100 mg Oral Daily  . [MAR Hold] Rivaroxaban  15 mg Oral Q supper  . [MAR Hold] sodium chloride flush  3 mL Intravenous Q12H   Continuous Infusions: . sodium chloride    . sodium chloride 50 mL/hr at 11/16/16 0549  . heparin    . heparin    . vancomycin 1,000 mg (11/16/16 1057)   PRN Meds: [MAR Hold] acetaminophen, heparin, heparin, [MAR Hold] nitroGLYCERIN, [MAR Hold] ondansetron (ZOFRAN) IV, [MAR Hold] sodium chloride flush   Vital Signs    Vitals:   11/15/16 1228 11/15/16 1945 11/16/16 0528 11/16/16 1058  BP: (!) 112/59 108/65 (!) 96/58   Pulse: (!) 124 (!) 126 (!) 126   Resp: 20 18 18    Temp: 98.2 F (36.8 C) 98 F (36.7 C) 98 F (36.7 C)   TempSrc: Oral Oral Oral   SpO2: 96% 99% 98% 96%  Weight:   145 lb 4.5 oz (65.9 kg)   Height:        Intake/Output Summary (Last 24 hours) at 11/16/16 1116 Last data filed at 11/16/16 1006  Gross per 24 hour  Intake          1033.33 ml   Output             1750 ml  Net          -716.67 ml   Filed Weights   11/14/16 0610 11/15/16 0650 11/16/16 0528  Weight: 148 lb (67.1 kg) 146 lb 4.8 oz (66.4 kg) 145 lb 4.5 oz (65.9 kg)    Telemetry    Atrial flutter - personally reviewed  ECG    GEN: Well nourished, well developed, in no acute distress  HEENT: normal  Neck: no JVD, carotid bruits, or masses Cardiac: tachycardic, regular; no murmurs, rubs, or gallops,no edema  Respiratory:  clear to auscultation bilaterally, normal work of breathing GI: soft, nontender, nondistended, + BS MS: no deformity or atrophy  Skin: warm and dry Neuro:  Strength and sensation are intact Psych: euthymic mood, full affect    Labs    Chemistry  Recent Labs Lab 11/10/16 0500 11/11/16 0941 11/11/16 1623 11/12/16 0851  NA 136 138  --  136  K 3.8 2.9* 3.7 4.4  CL 104 101  --  101  CO2 27 28  --  25  GLUCOSE 122* 110*  --  145*  BUN 13 10  --  11  CREATININE 1.30* 1.10*  --  1.33*  CALCIUM 8.7* 8.6*  --  9.1  GFRNONAA 35* 43*  --  34*  GFRAA 40* 49*  --  39*  ANIONGAP 5 9  --  10     Hematology  Recent Labs Lab 11/10/16 0756 11/11/16 0941  WBC 9.0 10.4  RBC 4.57 4.79  HGB 13.2 13.7  HCT 42.3 44.4  MCV 92.6 92.7  MCH 28.9 28.6  MCHC 31.2 30.9  RDW 16.2* 15.8*  PLT 203 215    Cardiac EnzymesNo results for input(s): TROPONINI in the last 168 hours. No results for input(s): TROPIPOC in the last 168 hours.   BNPNo results for input(s): BNP, PROBNP in the last 168 hours.   DDimer No results for input(s): DDIMER in the last 168 hours.   Radiology    Dg Chest Port 1 View Result Date: 11/09/2016 CLINICAL DATA:  CHF. EXAM: PORTABLE CHEST 1 VIEW COMPARISON:  January 02, 2015. FINDINGS: Postsurgical changes related to prior CABG. Stable cardiomegaly. Atherosclerotic calcification of the aortic arch. Normal pulmonary vascularity. Bibasilar atelectasis. No focal consolidation, pleural effusion, or pneumothorax.  IMPRESSION: Stable cardiomegaly.  No active disease. Electronically Signed   By: Titus Dubin M.D.   On: 11/09/2016 16:22    Cardiac Studies   No new studies    Assessment & Plan    1. Atrial flutter Plan for pacemaker and AV nodal ablation. Risks and benefits discussed. Risks include but not limited to bleeding, tamponade, heart block, stroke. She and her family understands the risks and have agree to the procedure.   2. CHF-Diastolic  Net out 5.4T. Continue current management.   3. HTN Well controlled.  4.  Sinus node dysfunction with juntional rhythm Pacemaker as above.   Deetta Siegmann [name], MD  11/16/2016, 11:16 AM

## 2016-11-16 NOTE — Progress Notes (Signed)
PHARMACY NOTE:  ANTIMICROBIAL RENAL DOSAGE ADJUSTMENT  Current antimicrobial regimen includes a mismatch between antimicrobial dosage and estimated renal function.  As per policy approved by the Pharmacy & Therapeutics and Medical Executive Committees, the antimicrobial dosage will be adjusted accordingly.  Current antimicrobial dosage:  Ancef 1gm IV Q6H x 3 doses  Indication: surgical prophylaxis  Renal Function:  Estimated Creatinine Clearance: 25.7 mL/min (A) (by C-G formula based on SCr of 1.33 mg/dL (H)). []      On intermittent HD, scheduled: []      On CRRT    Antimicrobial dosage has been changed to:  Ancef 1g IV Q12H x 2 doses  Additional comments:   Thank you for allowing pharmacy to be a part of this patient's care.  Tanyiah Laurich D. Mina Marble, PharmD, BCPS Pager:  [phone] 11/16/2016, 3:56 PM

## 2016-11-16 NOTE — Progress Notes (Signed)
RFV and L anterior chest dressing c/d/i - to floor RN and tele

## 2016-11-16 NOTE — Plan of Care (Signed)
Problem: Activity: Goal: Risk for activity intolerance will decrease Outcome: Progressing Pt. up to [facility] with assist.

## 2016-11-16 NOTE — Progress Notes (Signed)
Left anterior chest dressing clean,dry,intact with area to the corner(on the side of the patient's chin) with small area of ecchymosis noted. Left upper extremity noted in sling- pt educated to not to move left arm and that left arm must remain in sling pt. Verbalizes understanding.

## 2016-11-16 NOTE — Progress Notes (Signed)
6 fr and 8 fr sheath removed for RFV x2; manual pressure held x 30 min with hemostasis achieved. A dry sterile dressing applied to RFV site. Pt. Advised to hold pressure if laugh, cough, sneeze or bear down. Pt. Hand guided to RFV site over dressing and repeated verbal instructions back to me. R dp + palpable.  L anterior site dressing clean, dry, and intact and L upper extremity remains in sling.

## 2016-11-17 ENCOUNTER — Encounter (HOSPITAL_COMMUNITY): Payer: Self-pay | Admitting: Cardiology

## 2016-11-17 ENCOUNTER — Other Ambulatory Visit: Payer: Self-pay

## 2016-11-17 ENCOUNTER — Inpatient Hospital Stay (HOSPITAL_COMMUNITY): Payer: Medicare Other

## 2016-11-17 DIAGNOSIS — N390 Urinary tract infection, site not specified: Secondary | ICD-10-CM | POA: Diagnosis not present

## 2016-11-17 DIAGNOSIS — I509 Heart failure, unspecified: Secondary | ICD-10-CM | POA: Diagnosis not present

## 2016-11-17 DIAGNOSIS — I5032 Chronic diastolic (congestive) heart failure: Secondary | ICD-10-CM | POA: Diagnosis not present

## 2016-11-17 DIAGNOSIS — I110 Hypertensive heart disease with heart failure: Secondary | ICD-10-CM | POA: Diagnosis not present

## 2016-11-17 DIAGNOSIS — Z5181 Encounter for therapeutic drug level monitoring: Secondary | ICD-10-CM | POA: Diagnosis not present

## 2016-11-17 DIAGNOSIS — R41841 Cognitive communication deficit: Secondary | ICD-10-CM | POA: Diagnosis not present

## 2016-11-17 DIAGNOSIS — R2689 Other abnormalities of gait and mobility: Secondary | ICD-10-CM | POA: Diagnosis not present

## 2016-11-17 DIAGNOSIS — R2681 Unsteadiness on feet: Secondary | ICD-10-CM | POA: Diagnosis not present

## 2016-11-17 DIAGNOSIS — I4892 Unspecified atrial flutter: Secondary | ICD-10-CM | POA: Diagnosis not present

## 2016-11-17 DIAGNOSIS — M1990 Unspecified osteoarthritis, unspecified site: Secondary | ICD-10-CM | POA: Diagnosis not present

## 2016-11-17 DIAGNOSIS — Z950 Presence of cardiac pacemaker: Secondary | ICD-10-CM | POA: Diagnosis not present

## 2016-11-17 DIAGNOSIS — I5043 Acute on chronic combined systolic (congestive) and diastolic (congestive) heart failure: Secondary | ICD-10-CM | POA: Diagnosis not present

## 2016-11-17 DIAGNOSIS — M6281 Muscle weakness (generalized): Secondary | ICD-10-CM | POA: Diagnosis not present

## 2016-11-17 DIAGNOSIS — D649 Anemia, unspecified: Secondary | ICD-10-CM | POA: Diagnosis not present

## 2016-11-17 DIAGNOSIS — I5033 Acute on chronic diastolic (congestive) heart failure: Secondary | ICD-10-CM | POA: Diagnosis not present

## 2016-11-17 DIAGNOSIS — I481 Persistent atrial fibrillation: Secondary | ICD-10-CM | POA: Diagnosis not present

## 2016-11-17 DIAGNOSIS — I517 Cardiomegaly: Secondary | ICD-10-CM | POA: Diagnosis not present

## 2016-11-17 DIAGNOSIS — I10 Essential (primary) hypertension: Secondary | ICD-10-CM | POA: Diagnosis not present

## 2016-11-17 DIAGNOSIS — R278 Other lack of coordination: Secondary | ICD-10-CM | POA: Diagnosis not present

## 2016-11-17 NOTE — Clinical Social Work Placement (Signed)
   CLINICAL SOCIAL WORK PLACEMENT  NOTE  Date:  11/17/2016  Patient Details  Name: Cheryl Zamora MRN: [id number] Date of Birth: Oct 17, 1923  Clinical Social Work is seeking post-discharge placement for this patient at the [place] level of care (*CSW will initial, date and re-position this form in  chart as items are completed):  Yes   Patient/family provided with [place] Work Department's list of facilities offering this level of care within the geographic area requested by the patient (or if unable, by the patient's family).  Yes   Patient/family informed of their freedom to choose among providers that offer the needed level of care, that participate in Medicare, Medicaid or managed care program needed by the patient, have an available bed and are willing to accept the patient.  Yes   Patient/family informed of [facility]'s ownership interest in [facility] and [facility], as well as of the fact that they are under no obligation to receive care at these facilities.  PASRR submitted to EDS on       PASRR number received on       Existing PASRR number confirmed on 11/13/16     FL2 transmitted to all facilities in geographic area requested by pt/family on 11/13/16     FL2 transmitted to all facilities within larger geographic area on       Patient informed that his/her managed care company has contracts with or will negotiate with certain facilities, including the following:        Yes   Patient/family informed of bed offers received.  Patient chooses bed at [facility]     Physician recommends and patient chooses bed at      Patient to be transferred to [facility] on 11/17/16.  Patient to be transferred to facility by Nephew to transport by car     Patient family notified on 11/17/16 of transfer.  Name of family member notified:  Rudi Heap     PHYSICIAN Please prepare prescriptions      Additional Comment:    _______________________________________________ Candie Chroman, LCSW 11/17/2016, 11:08 AM

## 2016-11-17 NOTE — Progress Notes (Signed)
Physical Therapy Treatment Patient Details Name: Cheryl Zamora MRN: [id number] DOB: 1923/11/20 Today's Date: 11/17/2016    History of Present Illness Cheryl Zamora is a 83 y.o. female with a hx of known PAFib with DCCV historically, CAD (CABG), HTN,  who is being admitted with fluid OL and rapid AF.  cardioversion on 9/13.  Permanent pacemaker 9/18.    PT Comments    Patient was able to ambulate short distance with min/mod A +1 with HHA. Pt was confused during session and unable to recall precautions however they were reviewed during session. Irwin Brakeman, PT consulted due to new precautions. Continue to progress as tolerated with anticipated d/c to SNF for further skilled PT services.    Follow Up Recommendations  SNF;Supervision/Assistance - 24 hour     Equipment Recommendations  None recommended by PT    Recommendations for Other Services       Precautions / Restrictions Precautions Precautions: Fall Precaution Comments: pacemaker Restrictions Weight Bearing Restrictions: No    Mobility  Bed Mobility Overal bed mobility: Needs Assistance Bed Mobility: Supine to Sit     Supine to sit: Min assist Sit to supine: Min guard   General bed mobility comments: assist to elevate trunk into sitting with cues for technique  Transfers Overall transfer level: Needs assistance Equipment used: Rolling walker (2 wheeled) Transfers: Sit to/from Stand Sit to Stand: Min guard;Min assist         General transfer comment: assist to steady upon standing; pt able to power up with R UE to stand with min guard for safety  Ambulation/Gait Ambulation/Gait assistance: Min assist;Mod assist Ambulation Distance (Feet): 10 Feet Assistive device: 1 person hand held assist Gait Pattern/deviations: Step-through pattern;Decreased stride length;Trunk flexed Gait velocity: Decreased   General Gait Details: HHA to steady; cues for posture and bilat step lengths   Stairs             Wheelchair Mobility    Modified Rankin (Stroke Patients Only)       Balance Overall balance assessment: Needs assistance;History of Falls Sitting-balance support: No upper extremity supported;Feet supported Sitting balance-Leahy Scale: Good       Standing balance-Leahy Scale: Poor Standing balance comment: requires at least one hand on surface to balance during ADL                            Cognition Arousal/Alertness: Awake/alert Behavior During Therapy: WFL for tasks assessed/performed Overall Cognitive Status: Impaired/Different from baseline Area of Impairment: Orientation;Memory                 Orientation Level: Disoriented to;Situation;Place;Time   Memory: Decreased short-term memory;Decreased recall of precautions         General Comments: pt repeatedly asked why her chest hurts      Exercises      General Comments General comments (skin integrity, edema, etc.): pt needed assist for pericare in standing      Pertinent Vitals/Pain Pain Assessment: Faces Faces Pain Scale: Hurts little more Pain Location: incision site--L upper chest Pain Descriptors / Indicators: Grimacing;Sore Pain Intervention(s): Limited activity within patient's tolerance;Monitored during session;Repositioned    Home Living Family/patient expects to be discharged to:: Private residence Living Arrangements: Alone Available Help at Discharge: Family;Available PRN/intermittently (nephew) Type of Home: Other(Comment) (townhouse) Home Access: Stairs to enter Entrance Stairs-Rails: Left Home Layout: One level Home Equipment: Environmental consultant - 2 wheels;Shower seat Additional Comments: Nephew provides transport and assist with  meals.    Prior Function Level of Independence: Independent with assistive device(s)          PT Goals (current goals can now be found in the care plan section) Acute Rehab PT Goals Patient Stated Goal: to go back to sleep Progress towards PT  goals: Progressing toward goals    Frequency    Min 3X/week      PT Plan Current plan remains appropriate    Co-evaluation              AM-PAC PT "6 Clicks" Daily Activity  Outcome Measure  Difficulty turning over in bed (including adjusting bedclothes, sheets and blankets)?: A Little Difficulty moving from lying on back to sitting on the side of the bed? : Unable Difficulty sitting down on and standing up from a chair with arms (e.g., wheelchair, bedside commode, etc,.)?: Unable Help needed moving to and from a bed to chair (including a wheelchair)?: A Little Help needed walking in hospital room?: A Little Help needed climbing 3-5 steps with a railing? : A Lot 6 Click Score: 13    End of Session Equipment Utilized During Treatment: Gait belt;Other (comment) (L UE immobilizer) Activity Tolerance: Patient tolerated treatment well Patient left: in chair;with call bell/phone within reach;with chair alarm set Nurse Communication: Mobility status PT Visit Diagnosis: Muscle weakness (generalized) (M62.81);Difficulty in walking, not elsewhere classified (R26.2)     Time: 9798-9211 PT Time Calculation (min) (ACUTE ONLY): 33 min  Charges:  $Gait Training: 8-22 mins $Therapeutic Activity: 8-22 mins                    G Codes:       Earney Navy, PTA Pager: [phone]     Darliss Cheney 11/17/2016, 1:37 PM

## 2016-11-17 NOTE — Progress Notes (Signed)
[facility] at [phone], and gave report to receiving nurse, Remonia Richter.  IV has been removed without complication.  Telemetry has been D/C'd and CCMD notified.  Pt in stable condition and waiting transport via nephew.

## 2016-11-17 NOTE — Clinical Social Work Note (Addendum)
CSW faxed clinicals to [facility] for authorization review. Insurance will request an OT evaluation so OT order placed. CSW left voicemail with acute rehab department to try and expedite so evaluation can be faxed. Admissions coordinator will do paperwork with family once authorization obtained.  Dayton Scrape, CSW [phone]  9:40 am Patient's nephew not at bedside. CSW called to update him. He plans on transporting patient by car to [facility].  Dayton Scrape, [place] [phone]  11:06 am Insurance authorization approved: [id number], rug level RVA.   Dayton Scrape, [place]

## 2016-11-17 NOTE — Clinical Social Work Note (Signed)
CSW facilitated patient discharge including contacting patient family and facility to confirm patient discharge plans. Clinical information faxed to facility and family agreeable with plan. Patient's nephew will transport her by car to [facility]. They can take her at 2:00 pm. RN to call report prior to discharge [phone]).  CSW will sign off for now as social work intervention is no longer needed. Please consult [place] again if new needs arise.  Dayton Scrape, [place]

## 2016-11-17 NOTE — Progress Notes (Signed)
Pt. very restless/confused; forgetting pacemaker placed; reoriented pt.- noted to be short termed. Pt. In low bed.

## 2016-11-17 NOTE — Evaluation (Signed)
Occupational Therapy Evaluation Patient Details Name: Cheryl Zamora MRN: [id number] DOB: November 24, 1923 Today's Date: 11/17/2016    History of Present Illness Cheryl Zamora is a 83 y.o. female with a hx of known PAFib with DCCV historically, CAD (CABG), HTN,  who is being admitted with fluid OL and rapid AF.  cardioversion on 9/13.     Clinical Impression   Pt was living alone, ambulating with a walker. She reports she drove. Pt presents with generalized weakness, decreased standing balance and impaired cognition. She will need post acute rehab in a SNF upon discharge.     Follow Up Recommendations  SNF;Supervision/Assistance - 24 hour    Equipment Recommendations       Recommendations for Other Services       Precautions / Restrictions Precautions Precautions: Fall Precaution Comments: pacemaker Restrictions Weight Bearing Restrictions: No      Mobility Bed Mobility Overal bed mobility: Needs Assistance Bed Mobility: Supine to Sit;Sit to Supine     Supine to sit: Min assist Sit to supine: Min guard   General bed mobility comments: increased time, assist to raise trunk and scoot hips to EOB with pad  Transfers Overall transfer level: Needs assistance Equipment used: Rolling walker (2 wheeled) Transfers: Sit to/from Stand Sit to Stand: Min guard         General transfer comment: steadying assist, cues for hand placement    Balance Overall balance assessment: Needs assistance;History of Falls Sitting-balance support: No upper extremity supported;Feet supported Sitting balance-Leahy Scale: Good       Standing balance-Leahy Scale: Poor Standing balance comment: requires at least one hand on surface to balance during ADL                           ADL either performed or assessed with clinical judgement   ADL Overall ADL's : Needs assistance/impaired Eating/Feeding: Set up;Sitting   Grooming: Wash/dry hands;Wash/dry  face;Standing;Supervision/safety;Set up   Upper Body Bathing: Minimal assistance;Sitting   Lower Body Bathing: Sit to/from stand;Minimal assistance     Upper Body Dressing Details (indicate cue type and reason): total assist for shoulder immobilizer Lower Body Dressing: Minimal assistance;Sit to/from stand                 General ADL Comments: Educated pt in importance/purpose of shoulder immobilizer     Vision Baseline Vision/History: Wears glasses Wears Glasses: Distance only Patient Visual Report: No change from baseline       Perception     Praxis      Pertinent Vitals/Pain Pain Assessment: No/denies pain     Hand Dominance Right   Extremity/Trunk Assessment Upper Extremity Assessment Upper Extremity Assessment: Generalized weakness (did not formally assess L UE, replaced shoulder immobilizer)   Lower Extremity Assessment Lower Extremity Assessment: Defer to PT evaluation   Cervical / Trunk Assessment Cervical / Trunk Assessment: Kyphotic   Communication Communication Communication: HOH   Cognition Arousal/Alertness: Awake/alert Behavior During Therapy: WFL for tasks assessed/performed Overall Cognitive Status: Impaired/Different from baseline Area of Impairment: Orientation;Memory                 Orientation Level: Disoriented to;Situation;Place;Time   Memory: Decreased short-term memory;Decreased recall of precautions         General Comments: pt repeatedly asking if she was in [place], unaware of pacemaker precautions, thought it was 4 pm.   General Comments       Exercises     Shoulder Instructions  Home Living Family/patient expects to be discharged to:: Private residence Living Arrangements: Alone Available Help at Discharge: Family;Available PRN/intermittently (nephew) Type of Home: Other(Comment) (townhouse) Home Access: Stairs to enter [employer] of Steps: 2 Entrance Stairs-Rails: Left Home Layout:  One level     Bathroom Shower/Tub: Teacher, early years/pre: Standard     Home Equipment: Environmental consultant - 2 wheels;Shower seat   Additional Comments: Nephew provides transport and assist with meals.      Prior Functioning/Environment Level of Independence: Independent with assistive device(s)                 OT Problem List: Decreased strength;Decreased activity tolerance;Impaired balance (sitting and/or standing);Decreased cognition;Decreased safety awareness;Decreased knowledge of use of DME or AE;Decreased knowledge of precautions;Impaired UE functional use      OT Treatment/Interventions: Self-care/ADL training;DME and/or AE instruction;Therapeutic activities;Cognitive remediation/compensation;Patient/family education;Balance training    OT Goals(Current goals can be found in the care plan section) Acute Rehab OT Goals Patient Stated Goal: to go back to sleep OT Goal Formulation: With patient Time For Goal Achievement: 11/24/16 Potential to Achieve Goals: Good ADL Goals Pt Will Perform Grooming: with supervision;standing Pt Will Perform Upper Body Dressing: with supervision;sitting Pt Will Perform Lower Body Dressing: with supervision;sit to/from stand Pt Will Transfer to Toilet: with supervision;ambulating;bedside commode Pt Will Perform Toileting - Clothing Manipulation and hygiene: with supervision;sit to/from stand  OT Frequency: Min 2X/week   Barriers to D/C: Decreased caregiver support          Co-evaluation              AM-PAC PT "6 Clicks" Daily Activity     Outcome Measure Help from another person eating meals?: None Help from another person taking care of personal grooming?: A Little Help from another person toileting, which includes using toliet, bedpan, or urinal?: A Little Help from another person bathing (including washing, rinsing, drying)?: A Little Help from another person to put on and taking off regular upper body clothing?: A  Lot Help from another person to put on and taking off regular lower body clothing?: A Little 6 Click Score: 18   End of Session Equipment Utilized During Treatment: Gait belt;Rolling walker;Other (comment) (shoulder sling)  Activity Tolerance: Patient limited by fatigue (wanting to return to sleep) Patient left: in bed;with call bell/phone within reach;with bed alarm set  OT Visit Diagnosis: Unsteadiness on feet (R26.81);Other abnormalities of gait and mobility (R26.89);Muscle weakness (generalized) (M62.81);History of falling (Z91.81);Other symptoms and signs involving cognitive function                Time: 0957-1010 OT Time Calculation (min): 13 min Charges:  OT General Charges $OT Visit: 1 Visit OT Evaluation $OT Eval Moderate Complexity: 1 Mod G-Codes:    2016/12/11 Nestor Lewandowsky, OTR/L Pager: [phone] Werner Lean, Haze Boyden December 11, 2016, 11:13 AM

## 2016-11-25 ENCOUNTER — Ambulatory Visit (INDEPENDENT_AMBULATORY_CARE_PROVIDER_SITE_OTHER): Payer: Medicare Other | Admitting: *Deleted

## 2016-11-25 DIAGNOSIS — Z950 Presence of cardiac pacemaker: Secondary | ICD-10-CM

## 2016-11-25 DIAGNOSIS — I4819 Other persistent atrial fibrillation: Secondary | ICD-10-CM

## 2016-11-25 DIAGNOSIS — I481 Persistent atrial fibrillation: Secondary | ICD-10-CM | POA: Diagnosis not present

## 2016-11-25 LAB — CUP PACEART INCLINIC DEVICE CHECK
Battery Voltage: 3.18 V
Brady Statistic RV Percent Paced: 98.75 %
Date Time Interrogation Session: 20180927134726
Implantable Lead Implant Date: 20180918
Implantable Lead Model: 5076
Lead Channel Impedance Value: 456 Ohm
Lead Channel Impedance Value: 608 Ohm
Lead Channel Pacing Threshold Pulse Width: 0.4 ms
Lead Channel Setting Sensing Sensitivity: 4 mV
MDC IDC LEAD LOCATION: 753860
MDC IDC MSMT BATTERY REMAINING LONGEVITY: 146 mo
MDC IDC MSMT LEADCHNL RV PACING THRESHOLD AMPLITUDE: 1 V
MDC IDC MSMT LEADCHNL RV SENSING INTR AMPL: 19 mV
MDC IDC PG IMPLANT DT: 20180918
MDC IDC SET LEADCHNL RV PACING AMPLITUDE: 3.5 V
MDC IDC SET LEADCHNL RV PACING PULSEWIDTH: 0.4 ms

## 2016-11-25 NOTE — Progress Notes (Signed)
Wound check appointment. Steri-strips removed. Wound without redness or edema. Healing ecchymosis noted below device site and extending under left axilla. Incision edges approximated, wound well healed. Normal device function. Thresholds, sensing, and impedances consistent with implant measurements. Device programmed at 3.5V with auto capture programmed on for extra safety margin until 3 month visit. Histograms blunted due to programming. Received verbal order from SK to reprogram mode to VVIR at 70bpm, rate response at nominal settings. Chronic AF, +Xarelto. No high ventricular rates noted. Patient and family member educated about wound care, arm mobility, lifting restrictions, and Carelink monitor. ROV with SK on 02/15/17.

## 2016-11-29 ENCOUNTER — Ambulatory Visit: Payer: Medicare Other | Admitting: Cardiology

## 2016-12-06 DIAGNOSIS — I130 Hypertensive heart and chronic kidney disease with heart failure and stage 1 through stage 4 chronic kidney disease, or unspecified chronic kidney disease: Secondary | ICD-10-CM | POA: Diagnosis not present

## 2016-12-06 DIAGNOSIS — Z950 Presence of cardiac pacemaker: Secondary | ICD-10-CM | POA: Diagnosis not present

## 2016-12-06 DIAGNOSIS — I480 Paroxysmal atrial fibrillation: Secondary | ICD-10-CM | POA: Diagnosis not present

## 2016-12-06 DIAGNOSIS — G629 Polyneuropathy, unspecified: Secondary | ICD-10-CM | POA: Diagnosis not present

## 2016-12-06 DIAGNOSIS — N184 Chronic kidney disease, stage 4 (severe): Secondary | ICD-10-CM | POA: Diagnosis not present

## 2016-12-06 DIAGNOSIS — I5032 Chronic diastolic (congestive) heart failure: Secondary | ICD-10-CM | POA: Diagnosis not present

## 2016-12-06 DIAGNOSIS — K219 Gastro-esophageal reflux disease without esophagitis: Secondary | ICD-10-CM | POA: Diagnosis not present

## 2016-12-06 DIAGNOSIS — I2510 Atherosclerotic heart disease of native coronary artery without angina pectoris: Secondary | ICD-10-CM | POA: Diagnosis not present

## 2016-12-06 DIAGNOSIS — D631 Anemia in chronic kidney disease: Secondary | ICD-10-CM | POA: Diagnosis not present

## 2016-12-06 DIAGNOSIS — M1990 Unspecified osteoarthritis, unspecified site: Secondary | ICD-10-CM | POA: Diagnosis not present

## 2017-01-31 DIAGNOSIS — Z6826 Body mass index (BMI) 26.0-26.9, adult: Secondary | ICD-10-CM | POA: Diagnosis not present

## 2017-01-31 DIAGNOSIS — L03115 Cellulitis of right lower limb: Secondary | ICD-10-CM | POA: Diagnosis not present

## 2017-01-31 DIAGNOSIS — R609 Edema, unspecified: Secondary | ICD-10-CM | POA: Diagnosis not present

## 2017-01-31 DIAGNOSIS — I509 Heart failure, unspecified: Secondary | ICD-10-CM | POA: Diagnosis not present

## 2017-02-08 DIAGNOSIS — M1990 Unspecified osteoarthritis, unspecified site: Secondary | ICD-10-CM | POA: Diagnosis not present

## 2017-02-08 DIAGNOSIS — I5032 Chronic diastolic (congestive) heart failure: Secondary | ICD-10-CM | POA: Diagnosis not present

## 2017-02-08 DIAGNOSIS — I2510 Atherosclerotic heart disease of native coronary artery without angina pectoris: Secondary | ICD-10-CM | POA: Diagnosis not present

## 2017-02-08 DIAGNOSIS — L03115 Cellulitis of right lower limb: Secondary | ICD-10-CM | POA: Diagnosis not present

## 2017-02-08 DIAGNOSIS — I130 Hypertensive heart and chronic kidney disease with heart failure and stage 1 through stage 4 chronic kidney disease, or unspecified chronic kidney disease: Secondary | ICD-10-CM | POA: Diagnosis not present

## 2017-02-08 DIAGNOSIS — I480 Paroxysmal atrial fibrillation: Secondary | ICD-10-CM | POA: Diagnosis not present

## 2017-02-08 DIAGNOSIS — Z87440 Personal history of urinary (tract) infections: Secondary | ICD-10-CM | POA: Diagnosis not present

## 2017-02-08 DIAGNOSIS — Z7901 Long term (current) use of anticoagulants: Secondary | ICD-10-CM | POA: Diagnosis not present

## 2017-02-08 DIAGNOSIS — K219 Gastro-esophageal reflux disease without esophagitis: Secondary | ICD-10-CM | POA: Diagnosis not present

## 2017-02-08 DIAGNOSIS — Z950 Presence of cardiac pacemaker: Secondary | ICD-10-CM | POA: Diagnosis not present

## 2017-02-08 DIAGNOSIS — N184 Chronic kidney disease, stage 4 (severe): Secondary | ICD-10-CM | POA: Diagnosis not present

## 2017-02-08 DIAGNOSIS — D631 Anemia in chronic kidney disease: Secondary | ICD-10-CM | POA: Diagnosis not present

## 2017-02-08 DIAGNOSIS — G629 Polyneuropathy, unspecified: Secondary | ICD-10-CM | POA: Diagnosis not present

## 2017-02-09 ENCOUNTER — Encounter: Payer: Self-pay | Admitting: Internal Medicine

## 2017-02-15 ENCOUNTER — Encounter: Payer: Medicare Other | Admitting: Internal Medicine

## 2017-04-08 ENCOUNTER — Ambulatory Visit: Payer: Medicare Other | Admitting: Internal Medicine

## 2017-04-08 ENCOUNTER — Encounter: Payer: Self-pay | Admitting: Internal Medicine

## 2017-04-08 VITALS — BP 110/52 | HR 81 | Ht 64.0 in | Wt 144.0 lb

## 2017-04-08 DIAGNOSIS — Z950 Presence of cardiac pacemaker: Secondary | ICD-10-CM

## 2017-04-08 DIAGNOSIS — I481 Persistent atrial fibrillation: Secondary | ICD-10-CM | POA: Diagnosis not present

## 2017-04-08 DIAGNOSIS — I4819 Other persistent atrial fibrillation: Secondary | ICD-10-CM

## 2017-04-08 LAB — CUP PACEART INCLINIC DEVICE CHECK
Brady Statistic RV Percent Paced: 99.35 %
Date Time Interrogation Session: 20190208153302
Implantable Lead Implant Date: 20180918
Implantable Lead Location: 753860
Lead Channel Impedance Value: 570 Ohm
Lead Channel Pacing Threshold Pulse Width: 0.4 ms
Lead Channel Sensing Intrinsic Amplitude: 17 mV
Lead Channel Sensing Intrinsic Amplitude: 17 mV
Lead Channel Setting Pacing Amplitude: 2 V
MDC IDC MSMT BATTERY REMAINING LONGEVITY: 157 mo
MDC IDC MSMT BATTERY VOLTAGE: 3.14 V
MDC IDC MSMT LEADCHNL RV IMPEDANCE VALUE: 532 Ohm
MDC IDC MSMT LEADCHNL RV PACING THRESHOLD AMPLITUDE: 0.75 V
MDC IDC PG IMPLANT DT: 20180918
MDC IDC SET LEADCHNL RV PACING PULSEWIDTH: 0.4 ms
MDC IDC SET LEADCHNL RV SENSING SENSITIVITY: 4 mV

## 2017-04-08 NOTE — Progress Notes (Signed)
Patient Care Team: Burnard Bunting, MD as PCP - General (Internal Medicine)   HPI  Cheryl Zamora is a 84 y.o. female Seen in follow-up for sinus node dysfunction and junctional rhythm rapid r history of atrial flutter she underwent AV junction ablation and pacemaker implantation 9/18.  She has been much better.  She has had no recurrent lightheaded spells or breathing difficulties.  There is no edema.  She continues to live by herself.  Her nephew comes in every day to check on her.  Date Cr K Hgb  9/18 1.33 2.9>>4.4 13.7           Past Medical History:  Diagnosis Date  . Atrial fibrillation with rapid ventricular response ([place]) 11/2012; 10/2013   a. s/p TEE/DCCV 11/2012 & 10/2013 b. amiodarone caused significant QT prlongation (>618msec)  . Chronic combined systolic and diastolic CHF (congestive heart failure) ([place])   . Coronary artery disease 2005   a. s/p CABG (2012)  . GERD (gastroesophageal reflux disease)   . Hyperlipidemia    pt denies this hx on 01/03/2015  . Hypertension   . Neuropathy   . Osteoarthritis of back     Past Surgical History:  Procedure Laterality Date  . ABDOMINAL HYSTERECTOMY    . AV NODE ABLATION N/A 11/16/2016   Procedure: AV Node Ablation;  Surgeon: Constance Haw, MD;  Location: [place] [facility];  Service: Cardiovascular;  Laterality: N/A;  . BACK SURGERY    . CARDIAC CATHETERIZATION  04/12/2003   [place] 60, LAD 90, D1 30, CFX 95->T, RCA OK  . CARDIOVERSION N/A 12/13/2012   Procedure: CARDIOVERSION;  Surgeon: Larey Dresser, MD;  Location: [place];  Service: Cardiovascular;  Laterality: N/A;  . CARDIOVERSION N/A 11/13/2013   Procedure: CARDIOVERSION;  Surgeon: Caroleann Spark, MD;  Location: [facility] END[facility]COPY;  Service: Cardiovascular;  Laterality: N/A;  . CARDIOVERSION N/A 04/12/2014   Procedure: CARDIOVERSION;  Surgeon: Thayer Headings, MD;  Location: [facility] ENDOSCOPY;  Service: Cardiovascular;  Laterality: N/A;  .  CARDIOVERSION N/A 01/06/2015   Procedure: CARDIOVERSION;  Surgeon: Larey Dresser, MD;  Location: [place];  Service: Cardiovascular;  Laterality: N/A;  . CARDIOVERSION N/A 11/11/2016   Procedure: CARDIOVERSION;  Surgeon: Josue Hector, MD;  Location: [facility] ENDOSCOPY;  Service: Cardiovascular;  Laterality: N/A;  . [place]  1960's   "ruptured disc'  . CORONARY ANGIOPLASTY    . CORONARY ARTERY BYPASS GRAFT  04/18/2003   x 4; LIMA-LAD, SVG-D1, SVG-OM1-OM2  . PACEMAKER IMPLANT N/A 11/16/2016   Procedure: Pacemaker Implant;  Surgeon: Constance Haw, MD;  Location: [place] [facility];  Service: Cardiovascular;  Laterality: N/A;  . TEE WITHOUT CARDIOVERSION N/A 12/13/2012   Procedure: TRANSESOPHAGEAL ECHOCARDIOGRAM (TEE);  Surgeon: Larey Dresser, MD;  Location: [facility] ENDOSCOPY;  Service: Cardiovascular;  Laterality: N/A;  . TEE WITHOUT CARDIOVERSION N/A 11/13/2013   Procedure: TRANSESOPHAGEAL ECHOCARDIOGRAM (TEE);  Surgeon: Jatziri Spark, MD;  Location: [place];  Service: Cardiovascular;  Laterality: N/A;  . TEE WITHOUT CARDIOVERSION N/A 11/11/2016   Procedure: TRANSESOPHAGEAL ECHOCARDIOGRAM (TEE);  Surgeon: Josue Hector, MD;  Location: [facility] ENDOSCOPY;  Service: Cardiovascular;  Laterality: N/A;  . TONSILLECTOMY      Current Outpatient Medications  Medication Sig Dispense Refill  . acetaminophen (TYLENOL) 325 MG tablet Take 650 mg by mouth every 6 (six) hours as needed for mild pain.    . Cholecalciferol (VITAMIN D3) 1000 units CAPS Take 1,000 Units by mouth  daily.    . furosemide (LASIX) 40 MG tablet Take 1 tablet (40 mg total) by mouth daily. NEED OV. 30 tablet 0  . memantine (NAMENDA) 10 MG tablet Take 10 mg by mouth daily.     . metoprolol succinate (TOPROL-XL) 25 MG 24 hr tablet Take 1 tablet (25 mg total) by mouth daily. Please schedule appointment for refills. (Patient taking differently: Take 75 mg by mouth daily. Please schedule appointment for refills.) 30  tablet 0  . potassium chloride SA (K-DUR,KLOR-CON) 20 MEQ tablet TAKE 1 TABLET(20 MEQ) BY MOUTH DAILY 7 tablet 0  . XARELTO 15 MG TABS tablet Take 15 mg by mouth daily.   5   No current facility-administered medications for this visit.     Allergies  Allergen Reactions  . Digoxin And Related Other (See Comments)    Increased HR  . Amiodarone Other (See Comments)    Excessive QT prolongation, nausea  . Codeine Nausea And Vomiting  . Penicillins Nausea Only      Review of Systems negative except from HPI and PMH  Physical Exam BP (!) 110/52   Pulse 81   Ht 5\' 4"  (1.626 m)   Wt 144 lb (65.3 kg)   BMI 24.72 kg/m  Well developed and nourished in no acute distress HENT normal Neck supple with JVP-flat Clear Device pocket well healed; without hematoma or erythema.  There is no tethering  Regular rate and rhythm, no murmurs or gallops Abd-soft with active BS No Clubbing cyanosis edema Skin-warm and dry A & Oriented  Grossly normal sensory and motor function   ECG demonstrates atrial flutter with underlying complete heart block and ventricular pacing at a rate  80  Assessment and  Plan  Atrial flutter-permanent  Complete heart block-iatrogenic status post AV junction ablation  Pacemaker-Medtronic  Hypertension   Blood pressure is now running on the low side.  We will discontinue Toprol.  Heart rates are well controlled with device therapy.  No bleeding issues we will check her CBC and renal function.  Xarelto appropriately dosed for renal insufficiency   Current medicines are reviewed at length with the patient today .  The patient does not  have concerns regarding medicines.

## 2017-04-08 NOTE — Patient Instructions (Addendum)
Medication Instructions:  Stop Toprol- XL  Labwork: CBC and BMP  Testing/Procedures: None ordered.  Follow-Up: Your physician recommends that you schedule a follow-up appointment in: 6 months with Chanetta Marshall, PA  Any Other Special Instructions Will Be Listed Below (If Applicable).     If you need a refill on your cardiac medications before your next appointment, please call your pharmacy.

## 2017-04-09 LAB — BASIC METABOLIC PANEL
BUN / CREAT RATIO: 11 — AB (ref 12–28)
BUN: 12 mg/dL (ref 10–36)
CO2: 23 mmol/L (ref 20–29)
Calcium: 9.7 mg/dL (ref 8.7–10.3)
Chloride: 102 mmol/L (ref 96–106)
Creatinine, Ser: 1.1 mg/dL — ABNORMAL HIGH (ref 0.57–1.00)
GFR calc non Af Amer: 44 mL/min/{1.73_m2} — ABNORMAL LOW (ref >59)
GFR, EST AFRICAN AMERICAN: 51 mL/min/{1.73_m2} — AB (ref >59)
GLUCOSE: 88 mg/dL (ref 65–99)
Potassium: 4 mmol/L (ref 3.5–5.2)
SODIUM: 144 mmol/L (ref 134–144)

## 2017-04-09 LAB — CBC WITH DIFFERENTIAL/PLATELET
BASOS ABS: 0.1 10*3/uL (ref 0.0–0.2)
BASOS: 1 %
EOS (ABSOLUTE): 0.2 10*3/uL (ref 0.0–0.4)
Eos: 2 %
HEMOGLOBIN: 14.7 g/dL (ref 11.1–15.9)
Hematocrit: 43.7 % (ref 34.0–46.6)
Immature Grans (Abs): 0 10*3/uL (ref 0.0–0.1)
Immature Granulocytes: 0 %
LYMPHS ABS: 2.3 10*3/uL (ref 0.7–3.1)
Lymphs: 24 %
MCH: 30.1 pg (ref 26.6–33.0)
MCHC: 33.6 g/dL (ref 31.5–35.7)
MCV: 89 fL (ref 79–97)
Monocytes Absolute: 0.6 10*3/uL (ref 0.1–0.9)
Monocytes: 6 %
NEUTROS ABS: 6.5 10*3/uL (ref 1.4–7.0)
Neutrophils: 67 %
Platelets: 254 10*3/uL (ref 150–379)
RBC: 4.89 x10E6/uL (ref 3.77–5.28)
RDW: 14.5 % (ref 12.3–15.4)
WBC: 9.7 10*3/uL (ref 3.4–10.8)

## 2017-04-11 ENCOUNTER — Telehealth: Payer: Self-pay

## 2017-04-11 NOTE — Telephone Encounter (Signed)
Spoke with Pt's nephew, Delfina Redwood, letting him know her labs were normal. He had no further questions.

## 2017-04-11 NOTE — Telephone Encounter (Signed)
-----   Message from Deboraha Sprang, MD sent at 04/09/2017 10:50 AM EST ----- Please Inform Patient that labs are normal Thanks

## 2017-05-20 DIAGNOSIS — H25813 Combined forms of age-related cataract, bilateral: Secondary | ICD-10-CM | POA: Diagnosis not present

## 2017-05-20 DIAGNOSIS — H401134 Primary open-angle glaucoma, bilateral, indeterminate stage: Secondary | ICD-10-CM | POA: Diagnosis not present

## 2017-05-20 DIAGNOSIS — H353132 Nonexudative age-related macular degeneration, bilateral, intermediate dry stage: Secondary | ICD-10-CM | POA: Diagnosis not present

## 2017-05-21 ENCOUNTER — Encounter: Payer: Self-pay | Admitting: Internal Medicine

## 2017-05-25 ENCOUNTER — Telehealth: Payer: Self-pay

## 2017-05-25 NOTE — Telephone Encounter (Signed)
   [facility] Pre-operative Risk Assessment    Request for surgical clearance:  1. What type of surgery is being performed? Cataract surgery   2. When is this surgery scheduled? TBD   3. What type of clearance is required (medical clearance vs. Pharmacy clearance to hold med vs. Both)? Pharmacy  4. Are there any medications that need to be held prior to surgery and how long? Xarelto, "d/c for a few days"    5. Practice name and name of physician performing surgery? [facility],  Dr. Kathrin Penner    6. What is your office phone and fax number?  [phone], fax: [phone]  7. Anesthesia type (None, local, MAC, general) ? Not Listed    Joaquim Lai 05/25/2017, 4:49 PM  _________________________________________________________________   (provider comments below)

## 2017-05-27 NOTE — Telephone Encounter (Signed)
   Primary Cardiologist: No primary care provider on file.  Chart reviewed as part of pre-operative protocol coverage. Given past medical history and time since last visit, based on ACC/AHA guidelines, SKYLEIGH WINDLE would be at acceptable risk for the planned procedure without further cardiovascular testing.   I will route this recommendation to the requesting party via Epic fax function and remove from pre-op pool.  Please call with questions.  Kerin Ransom, PA-C 05/27/2017, 3:06 PM

## 2017-06-08 DIAGNOSIS — H401134 Primary open-angle glaucoma, bilateral, indeterminate stage: Secondary | ICD-10-CM | POA: Diagnosis not present

## 2017-06-08 DIAGNOSIS — H25013 Cortical age-related cataract, bilateral: Secondary | ICD-10-CM | POA: Diagnosis not present

## 2017-07-11 ENCOUNTER — Ambulatory Visit (INDEPENDENT_AMBULATORY_CARE_PROVIDER_SITE_OTHER): Payer: Medicare Other | Admitting: *Deleted

## 2017-07-11 DIAGNOSIS — I481 Persistent atrial fibrillation: Secondary | ICD-10-CM | POA: Diagnosis not present

## 2017-07-11 DIAGNOSIS — I4819 Other persistent atrial fibrillation: Secondary | ICD-10-CM

## 2017-07-12 LAB — CUP PACEART REMOTE DEVICE CHECK
Battery Remaining Longevity: 152 mo
Battery Voltage: 3.11 V
Brady Statistic RV Percent Paced: 99.88 %
Implantable Lead Location: 753860
Implantable Lead Model: 5076
Lead Channel Impedance Value: 513 Ohm
Lead Channel Pacing Threshold Pulse Width: 0.4 ms
Lead Channel Sensing Intrinsic Amplitude: 18.125 mV
Lead Channel Setting Pacing Amplitude: 2 V
Lead Channel Setting Pacing Pulse Width: 0.4 ms
Lead Channel Setting Sensing Sensitivity: 4 mV
MDC IDC LEAD IMPLANT DT: 20180918
MDC IDC MSMT LEADCHNL RV IMPEDANCE VALUE: 456 Ohm
MDC IDC MSMT LEADCHNL RV PACING THRESHOLD AMPLITUDE: 0.75 V
MDC IDC MSMT LEADCHNL RV SENSING INTR AMPL: 18.125 mV
MDC IDC PG IMPLANT DT: 20180918
MDC IDC SESS DTM: 20190512165153

## 2017-07-12 NOTE — Progress Notes (Signed)
Remote pacemaker transmission.   

## 2017-07-13 ENCOUNTER — Encounter: Payer: Self-pay | Admitting: Cardiology

## 2017-08-10 DIAGNOSIS — E559 Vitamin D deficiency, unspecified: Secondary | ICD-10-CM | POA: Diagnosis not present

## 2017-08-10 DIAGNOSIS — I10 Essential (primary) hypertension: Secondary | ICD-10-CM | POA: Diagnosis not present

## 2017-08-17 DIAGNOSIS — I10 Essential (primary) hypertension: Secondary | ICD-10-CM | POA: Diagnosis not present

## 2017-08-17 DIAGNOSIS — N183 Chronic kidney disease, stage 3 (moderate): Secondary | ICD-10-CM | POA: Diagnosis not present

## 2017-08-17 DIAGNOSIS — I82402 Acute embolism and thrombosis of unspecified deep veins of left lower extremity: Secondary | ICD-10-CM | POA: Diagnosis not present

## 2017-08-17 DIAGNOSIS — Z0000 Encounter for general adult medical examination without abnormal findings: Secondary | ICD-10-CM | POA: Diagnosis not present

## 2017-10-10 ENCOUNTER — Ambulatory Visit (INDEPENDENT_AMBULATORY_CARE_PROVIDER_SITE_OTHER): Payer: Medicare Other | Admitting: *Deleted

## 2017-10-10 DIAGNOSIS — I481 Persistent atrial fibrillation: Secondary | ICD-10-CM | POA: Diagnosis not present

## 2017-10-10 DIAGNOSIS — I4819 Other persistent atrial fibrillation: Secondary | ICD-10-CM

## 2017-10-10 NOTE — Progress Notes (Signed)
Remote pacemaker transmission.   

## 2017-10-11 ENCOUNTER — Encounter: Payer: Self-pay | Admitting: Cardiology

## 2017-10-11 NOTE — Progress Notes (Signed)
Letter  

## 2017-11-03 LAB — CUP PACEART REMOTE DEVICE CHECK
Brady Statistic RV Percent Paced: 99.05 %
Implantable Lead Implant Date: 20180918
Implantable Pulse Generator Implant Date: 20180918
Lead Channel Impedance Value: 494 Ohm
Lead Channel Pacing Threshold Amplitude: 0.75 V
Lead Channel Pacing Threshold Pulse Width: 0.4 ms
Lead Channel Sensing Intrinsic Amplitude: 25.75 mV
Lead Channel Sensing Intrinsic Amplitude: 25.75 mV
Lead Channel Setting Sensing Sensitivity: 4 mV
MDC IDC LEAD LOCATION: 753860
MDC IDC MSMT BATTERY REMAINING LONGEVITY: 150 mo
MDC IDC MSMT BATTERY VOLTAGE: 3.08 V
MDC IDC MSMT LEADCHNL RV IMPEDANCE VALUE: 532 Ohm
MDC IDC SESS DTM: 20190810150150
MDC IDC SET LEADCHNL RV PACING AMPLITUDE: 2 V
MDC IDC SET LEADCHNL RV PACING PULSEWIDTH: 0.4 ms

## 2018-01-09 ENCOUNTER — Ambulatory Visit (INDEPENDENT_AMBULATORY_CARE_PROVIDER_SITE_OTHER): Payer: Medicare Other | Admitting: *Deleted

## 2018-01-09 DIAGNOSIS — I4819 Other persistent atrial fibrillation: Secondary | ICD-10-CM | POA: Diagnosis not present

## 2018-01-09 NOTE — Progress Notes (Signed)
Remote pacemaker transmission.   

## 2018-03-03 LAB — CUP PACEART REMOTE DEVICE CHECK
Battery Remaining Longevity: 147 mo
Battery Voltage: 3.05 V
Implantable Lead Location: 753860
Implantable Pulse Generator Implant Date: 20180918
Lead Channel Impedance Value: 475 Ohm
Lead Channel Impedance Value: 532 Ohm
Lead Channel Pacing Threshold Amplitude: 0.75 V
Lead Channel Setting Pacing Pulse Width: 0.4 ms
Lead Channel Setting Sensing Sensitivity: 4 mV
MDC IDC LEAD IMPLANT DT: 20180918
MDC IDC MSMT LEADCHNL RV PACING THRESHOLD PULSEWIDTH: 0.4 ms
MDC IDC MSMT LEADCHNL RV SENSING INTR AMPL: 25.75 mV
MDC IDC MSMT LEADCHNL RV SENSING INTR AMPL: 25.75 mV
MDC IDC SESS DTM: 20191108180809
MDC IDC SET LEADCHNL RV PACING AMPLITUDE: 2 V
MDC IDC STAT BRADY RV PERCENT PACED: 99.14 %

## 2018-03-14 DIAGNOSIS — I130 Hypertensive heart and chronic kidney disease with heart failure and stage 1 through stage 4 chronic kidney disease, or unspecified chronic kidney disease: Secondary | ICD-10-CM | POA: Diagnosis not present

## 2018-03-14 DIAGNOSIS — Z6825 Body mass index (BMI) 25.0-25.9, adult: Secondary | ICD-10-CM | POA: Diagnosis not present

## 2018-03-14 DIAGNOSIS — F0280 Dementia in other diseases classified elsewhere without behavioral disturbance: Secondary | ICD-10-CM | POA: Diagnosis not present

## 2018-03-14 DIAGNOSIS — N183 Chronic kidney disease, stage 3 (moderate): Secondary | ICD-10-CM | POA: Diagnosis not present

## 2018-03-14 DIAGNOSIS — I2510 Atherosclerotic heart disease of native coronary artery without angina pectoris: Secondary | ICD-10-CM | POA: Diagnosis not present

## 2018-03-14 DIAGNOSIS — Z7901 Long term (current) use of anticoagulants: Secondary | ICD-10-CM | POA: Diagnosis not present

## 2018-03-14 DIAGNOSIS — E663 Overweight: Secondary | ICD-10-CM | POA: Diagnosis not present

## 2018-03-14 DIAGNOSIS — Z86718 Personal history of other venous thrombosis and embolism: Secondary | ICD-10-CM | POA: Diagnosis not present

## 2018-03-14 DIAGNOSIS — Z87891 Personal history of nicotine dependence: Secondary | ICD-10-CM | POA: Diagnosis not present

## 2018-03-14 DIAGNOSIS — I4891 Unspecified atrial fibrillation: Secondary | ICD-10-CM | POA: Diagnosis not present

## 2018-03-14 DIAGNOSIS — G309 Alzheimer's disease, unspecified: Secondary | ICD-10-CM | POA: Diagnosis not present

## 2018-03-14 DIAGNOSIS — E559 Vitamin D deficiency, unspecified: Secondary | ICD-10-CM | POA: Diagnosis not present

## 2018-03-14 DIAGNOSIS — I5030 Unspecified diastolic (congestive) heart failure: Secondary | ICD-10-CM | POA: Diagnosis not present

## 2018-03-14 DIAGNOSIS — G609 Hereditary and idiopathic neuropathy, unspecified: Secondary | ICD-10-CM | POA: Diagnosis not present

## 2018-03-14 DIAGNOSIS — Z950 Presence of cardiac pacemaker: Secondary | ICD-10-CM | POA: Diagnosis not present

## 2018-04-10 ENCOUNTER — Ambulatory Visit (INDEPENDENT_AMBULATORY_CARE_PROVIDER_SITE_OTHER): Payer: Medicare Other

## 2018-04-10 DIAGNOSIS — I4819 Other persistent atrial fibrillation: Secondary | ICD-10-CM

## 2018-04-11 LAB — CUP PACEART REMOTE DEVICE CHECK
Battery Remaining Longevity: 145 mo
Battery Voltage: 3.04 V
Brady Statistic RV Percent Paced: 98.44 %
Implantable Lead Implant Date: 20180918
Implantable Lead Location: 753860
Implantable Lead Model: 5076
Lead Channel Pacing Threshold Amplitude: 0.75 V
Lead Channel Pacing Threshold Pulse Width: 0.4 ms
Lead Channel Setting Pacing Amplitude: 2 V
Lead Channel Setting Pacing Pulse Width: 0.4 ms
Lead Channel Setting Sensing Sensitivity: 4 mV
MDC IDC MSMT LEADCHNL RV IMPEDANCE VALUE: 513 Ohm
MDC IDC MSMT LEADCHNL RV IMPEDANCE VALUE: 551 Ohm
MDC IDC MSMT LEADCHNL RV SENSING INTR AMPL: 25.75 mV
MDC IDC MSMT LEADCHNL RV SENSING INTR AMPL: 25.75 mV
MDC IDC PG IMPLANT DT: 20180918
MDC IDC SESS DTM: 20200208165531

## 2018-04-21 NOTE — Progress Notes (Signed)
Remote pacemaker transmission.   

## 2018-06-14 DIAGNOSIS — R82998 Other abnormal findings in urine: Secondary | ICD-10-CM | POA: Diagnosis not present

## 2018-06-14 DIAGNOSIS — R410 Disorientation, unspecified: Secondary | ICD-10-CM | POA: Diagnosis not present

## 2018-06-14 DIAGNOSIS — I10 Essential (primary) hypertension: Secondary | ICD-10-CM | POA: Diagnosis not present

## 2018-06-14 DIAGNOSIS — R413 Other amnesia: Secondary | ICD-10-CM | POA: Diagnosis not present

## 2018-06-14 DIAGNOSIS — R443 Hallucinations, unspecified: Secondary | ICD-10-CM | POA: Diagnosis not present

## 2018-06-21 IMAGING — DX DG CHEST 2V
2 series · 2 of 2 positions shown · non-contrast
Comparison: 11/09/2016

CLINICAL DATA: Post pacer

EXAM:
CHEST  2 VIEW

[w chest lat]
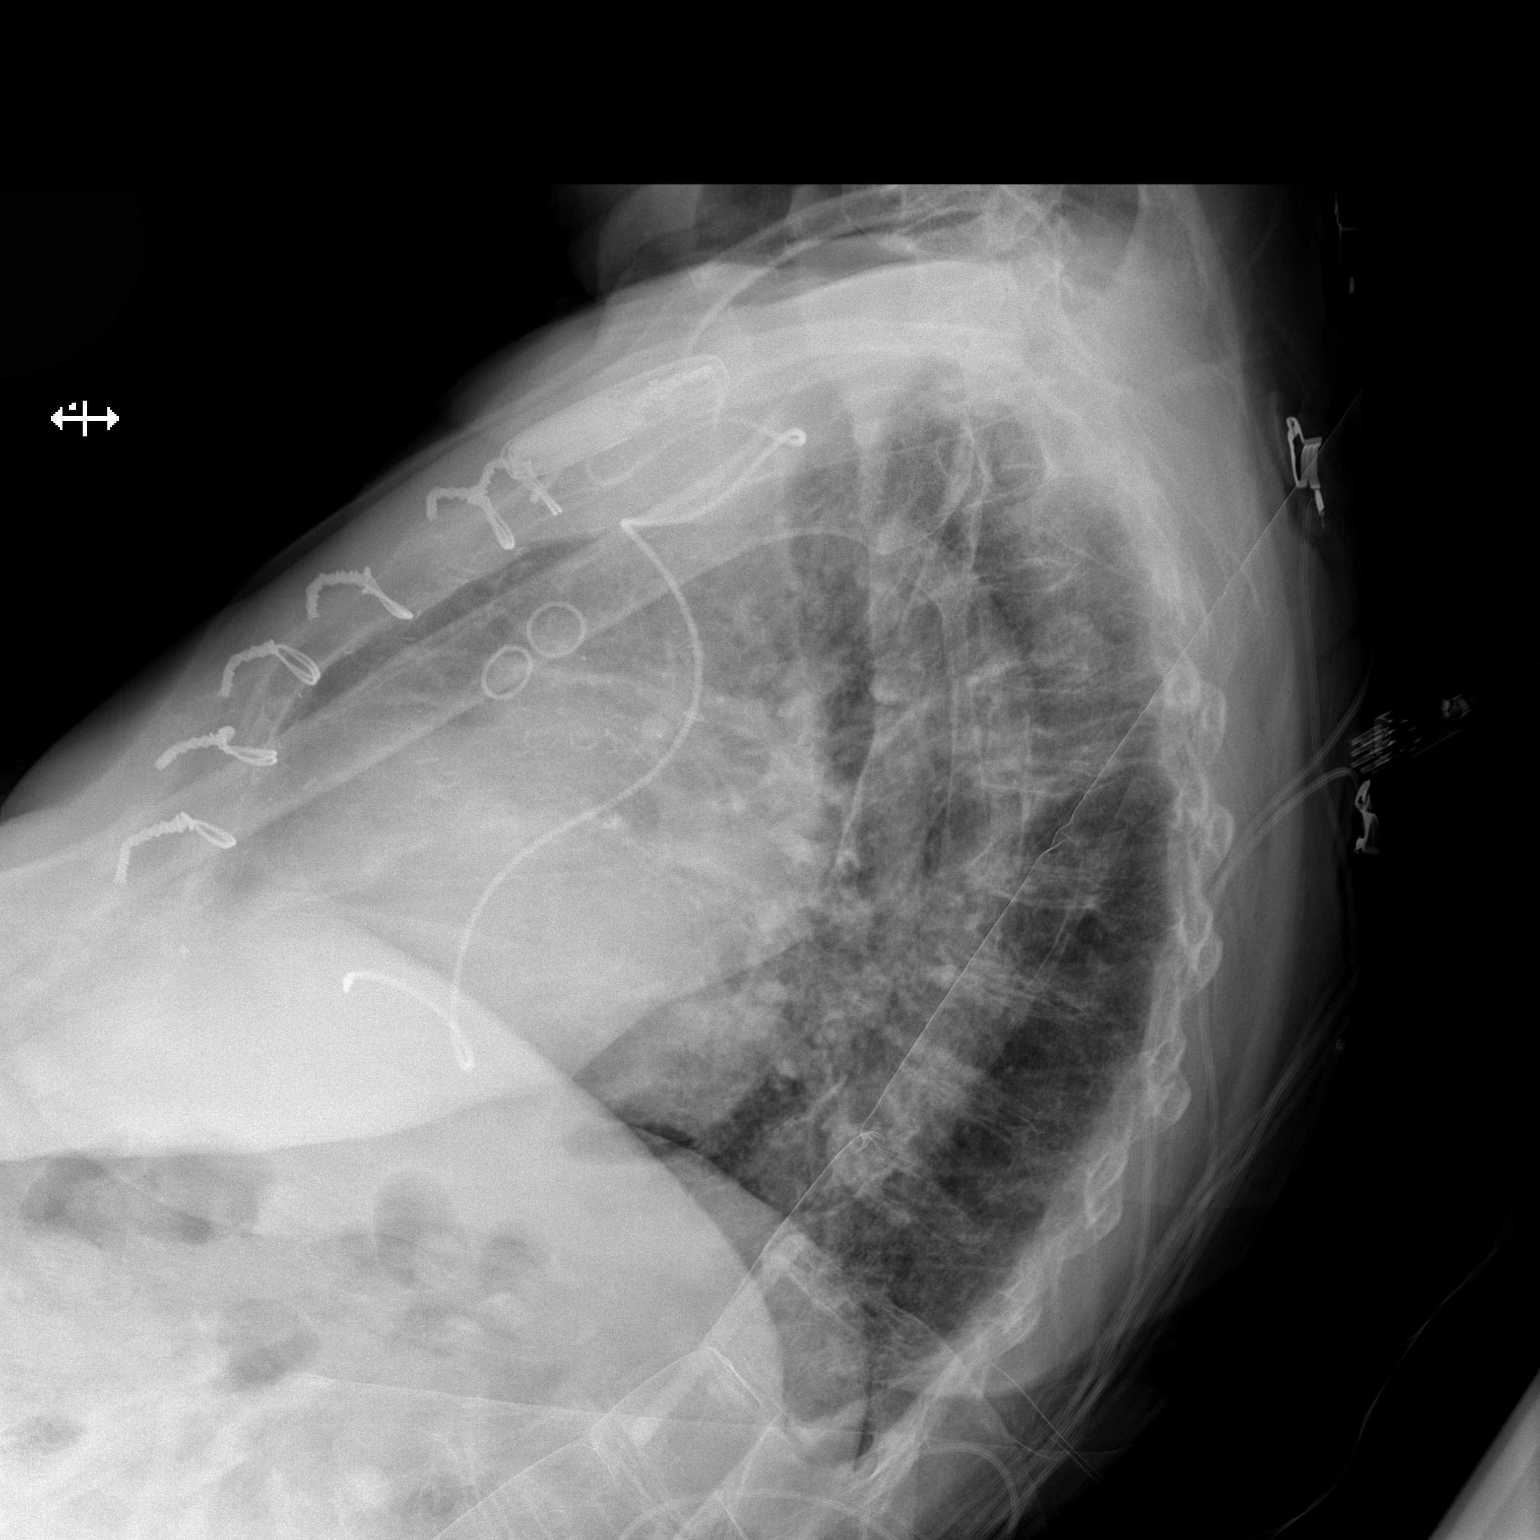

[x chest ap]
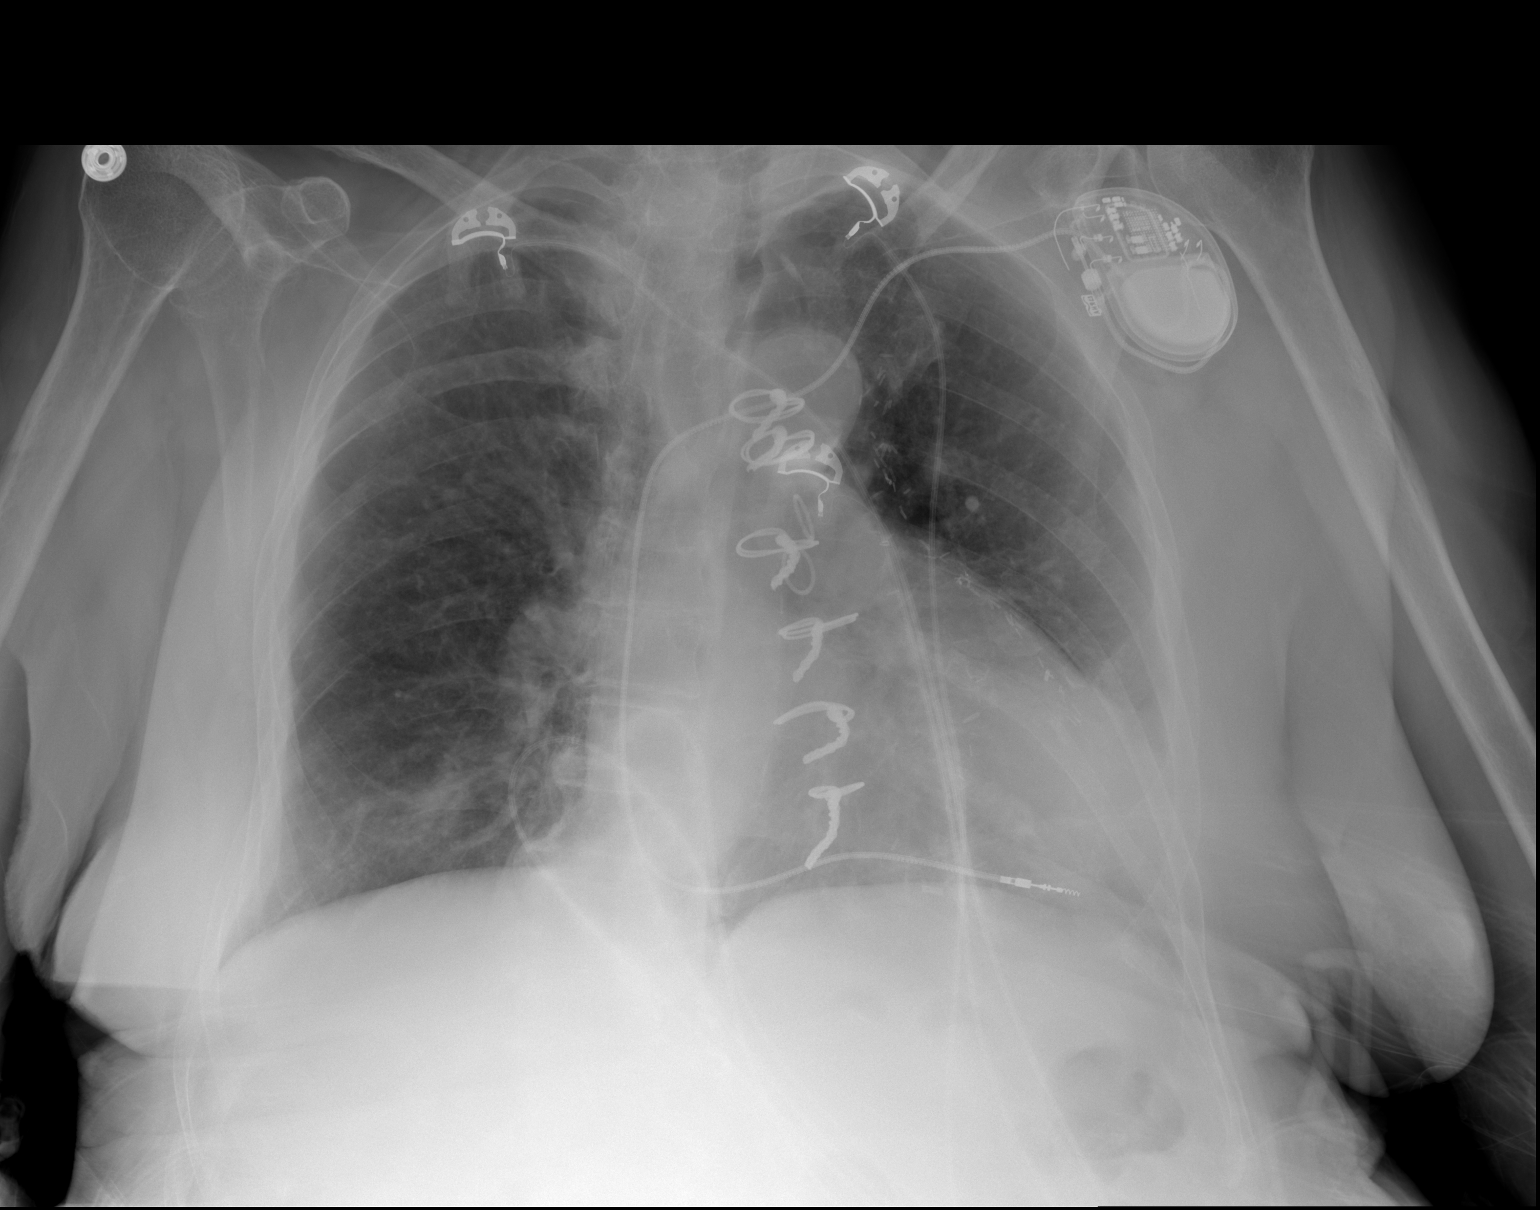

[2 of 2 positions shown; findings below may reference images not displayed]

FINDINGS: Interval placement of single lead pacer with tip in the right
atrium. Prior CABG. Cardiomegaly. No confluent opacities, effusions
or edema. No acute bony abnormality. No pneumothorax.
IMPRESSION: Left pacer placement with tip in the right ventricle. No
pneumothorax.

Cardiomegaly, no acute findings.

## 2018-07-10 ENCOUNTER — Other Ambulatory Visit: Payer: Self-pay

## 2018-07-10 ENCOUNTER — Ambulatory Visit (INDEPENDENT_AMBULATORY_CARE_PROVIDER_SITE_OTHER): Payer: Medicare Other | Admitting: *Deleted

## 2018-07-10 DIAGNOSIS — I4819 Other persistent atrial fibrillation: Secondary | ICD-10-CM

## 2018-07-11 LAB — CUP PACEART REMOTE DEVICE CHECK
Battery Remaining Longevity: 142 mo
Battery Voltage: 3.03 V
Brady Statistic RV Percent Paced: 99.67 %
Date Time Interrogation Session: 20200510165345
Implantable Lead Implant Date: 20180918
Implantable Lead Location: 753860
Implantable Lead Model: 5076
Implantable Pulse Generator Implant Date: 20180918
Lead Channel Impedance Value: 494 Ohm
Lead Channel Impedance Value: 551 Ohm
Lead Channel Pacing Threshold Amplitude: 0.625 V
Lead Channel Pacing Threshold Pulse Width: 0.4 ms
Lead Channel Sensing Intrinsic Amplitude: 21.625 mV
Lead Channel Sensing Intrinsic Amplitude: 21.625 mV
Lead Channel Setting Pacing Amplitude: 2 V
Lead Channel Setting Pacing Pulse Width: 0.4 ms
Lead Channel Setting Sensing Sensitivity: 4 mV

## 2018-07-19 NOTE — Progress Notes (Signed)
Remote pacemaker transmission.   

## 2018-09-06 DIAGNOSIS — N390 Urinary tract infection, site not specified: Secondary | ICD-10-CM | POA: Diagnosis not present

## 2018-09-06 DIAGNOSIS — R300 Dysuria: Secondary | ICD-10-CM | POA: Diagnosis not present

## 2018-10-08 LAB — CUP PACEART REMOTE DEVICE CHECK
Battery Remaining Longevity: 139 mo
Battery Voltage: 3.03 V
Brady Statistic RV Percent Paced: 99.76 %
Date Time Interrogation Session: 20200807164041
Implantable Lead Implant Date: 20180918
Implantable Lead Location: 753860
Implantable Lead Model: 5076
Implantable Pulse Generator Implant Date: 20180918
Lead Channel Impedance Value: 513 Ohm
Lead Channel Impedance Value: 551 Ohm
Lead Channel Pacing Threshold Amplitude: 0.625 V
Lead Channel Pacing Threshold Pulse Width: 0.4 ms
Lead Channel Sensing Intrinsic Amplitude: 21.625 mV
Lead Channel Sensing Intrinsic Amplitude: 21.625 mV
Lead Channel Setting Pacing Amplitude: 2 V
Lead Channel Setting Pacing Pulse Width: 0.4 ms
Lead Channel Setting Sensing Sensitivity: 4 mV

## 2018-10-09 ENCOUNTER — Ambulatory Visit (INDEPENDENT_AMBULATORY_CARE_PROVIDER_SITE_OTHER): Payer: Medicare Other | Admitting: *Deleted

## 2018-10-09 DIAGNOSIS — I4819 Other persistent atrial fibrillation: Secondary | ICD-10-CM | POA: Diagnosis not present

## 2018-10-09 DIAGNOSIS — I5033 Acute on chronic diastolic (congestive) heart failure: Secondary | ICD-10-CM

## 2018-10-18 NOTE — Progress Notes (Signed)
Remote pacemaker transmission.   

## 2019-01-08 ENCOUNTER — Encounter: Payer: Medicare Other | Admitting: *Deleted

## 2019-01-10 ENCOUNTER — Telehealth: Payer: Self-pay

## 2019-01-10 NOTE — Telephone Encounter (Signed)
Left message for patient to remind of missed remote transmission. LL

## 2019-02-16 ENCOUNTER — Ambulatory Visit (INDEPENDENT_AMBULATORY_CARE_PROVIDER_SITE_OTHER): Payer: Medicare Other | Admitting: *Deleted

## 2019-02-16 DIAGNOSIS — I480 Paroxysmal atrial fibrillation: Secondary | ICD-10-CM

## 2019-02-16 LAB — CUP PACEART REMOTE DEVICE CHECK
Battery Remaining Longevity: 134 mo
Battery Voltage: 3.02 V
Brady Statistic RV Percent Paced: 99.78 %
Date Time Interrogation Session: 20201218132701
Implantable Lead Implant Date: 20180918
Implantable Lead Location: 753860
Implantable Lead Model: 5076
Implantable Pulse Generator Implant Date: 20180918
Lead Channel Impedance Value: 513 Ohm
Lead Channel Impedance Value: 551 Ohm
Lead Channel Pacing Threshold Amplitude: 0.5 V
Lead Channel Pacing Threshold Pulse Width: 0.4 ms
Lead Channel Sensing Intrinsic Amplitude: 21.625 mV
Lead Channel Sensing Intrinsic Amplitude: 21.625 mV
Lead Channel Setting Pacing Amplitude: 2 V
Lead Channel Setting Pacing Pulse Width: 0.4 ms
Lead Channel Setting Sensing Sensitivity: 4 mV

## 2019-03-07 NOTE — Progress Notes (Signed)
PPM Remote  

## 2019-03-15 ENCOUNTER — Ambulatory Visit (INDEPENDENT_AMBULATORY_CARE_PROVIDER_SITE_OTHER): Payer: Medicare Other | Admitting: Podiatry

## 2019-03-15 ENCOUNTER — Encounter: Payer: Self-pay | Admitting: Podiatry

## 2019-03-15 ENCOUNTER — Other Ambulatory Visit: Payer: Self-pay

## 2019-03-15 DIAGNOSIS — L97521 Non-pressure chronic ulcer of other part of left foot limited to breakdown of skin: Secondary | ICD-10-CM

## 2019-03-15 DIAGNOSIS — L03116 Cellulitis of left lower limb: Secondary | ICD-10-CM | POA: Diagnosis not present

## 2019-03-15 MED ORDER — DOXYCYCLINE HYCLATE 100 MG PO TABS: 100.0000 mg | ORAL_TABLET | Freq: Two times a day (BID) | ORAL | 0 refills | Status: DC

## 2019-03-19 ENCOUNTER — Telehealth: Payer: Self-pay | Admitting: Podiatry

## 2019-03-19 NOTE — Telephone Encounter (Signed)
Patients nephew called stating he would like to reschedule the patients next appt for the following week. Cheryl Zamora states he has been having trouble getting the patient to take her antibiotics because she sleeps such long hours and does not believe she has been on the medication long enough to see any change. Nephew is requesting that the webex/virtual appt be scheduled around the lunch hour.   Please let me know a day and time that will work for the [employer].

## 2019-03-20 NOTE — Telephone Encounter (Signed)
That would be OK to reschedule. We can put it on the following week one day around 12 so I can do it at lunch.

## 2019-03-21 NOTE — Telephone Encounter (Signed)
OK, please have them come in or do a virtual visit when able.

## 2019-03-21 NOTE — Telephone Encounter (Signed)
Called and spoke with patients nephew regarding changing the Webex appt for next week and he informed me that the patient had a bad fall yesterday and needed to cancel the appt for now. They are having home health come out to care for her over the next few weeks and will call us to reschedule at a later date.

## 2019-03-22 ENCOUNTER — Emergency Department (HOSPITAL_COMMUNITY)
Admission: EM | Admit: 2019-03-22 | Discharge: 2019-03-22 | Disposition: A | Discharge status: Home / Self Care | Payer: Medicare Other | Attending: Emergency Medicine | Admitting: Emergency Medicine

## 2019-03-22 ENCOUNTER — Emergency Department (HOSPITAL_COMMUNITY): Payer: Medicare Other

## 2019-03-22 ENCOUNTER — Encounter (HOSPITAL_COMMUNITY): Payer: Self-pay

## 2019-03-22 ENCOUNTER — Other Ambulatory Visit: Payer: Self-pay

## 2019-03-22 DIAGNOSIS — I130 Hypertensive heart and chronic kidney disease with heart failure and stage 1 through stage 4 chronic kidney disease, or unspecified chronic kidney disease: Secondary | ICD-10-CM | POA: Insufficient documentation

## 2019-03-22 DIAGNOSIS — R413 Other amnesia: Secondary | ICD-10-CM | POA: Insufficient documentation

## 2019-03-22 DIAGNOSIS — Z79899 Other long term (current) drug therapy: Secondary | ICD-10-CM | POA: Diagnosis not present

## 2019-03-22 DIAGNOSIS — Z7401 Bed confinement status: Secondary | ICD-10-CM | POA: Diagnosis not present

## 2019-03-22 DIAGNOSIS — Y999 Unspecified external cause status: Secondary | ICD-10-CM | POA: Diagnosis not present

## 2019-03-22 DIAGNOSIS — Y92009 Unspecified place in unspecified non-institutional (private) residence as the place of occurrence of the external cause: Secondary | ICD-10-CM

## 2019-03-22 DIAGNOSIS — N183 Chronic kidney disease, stage 3 unspecified: Secondary | ICD-10-CM | POA: Diagnosis not present

## 2019-03-22 DIAGNOSIS — M25571 Pain in right ankle and joints of right foot: Secondary | ICD-10-CM | POA: Diagnosis not present

## 2019-03-22 DIAGNOSIS — S0990XA Unspecified injury of head, initial encounter: Secondary | ICD-10-CM | POA: Diagnosis not present

## 2019-03-22 DIAGNOSIS — Y9389 Activity, other specified: Secondary | ICD-10-CM | POA: Diagnosis not present

## 2019-03-22 DIAGNOSIS — Z7901 Long term (current) use of anticoagulants: Secondary | ICD-10-CM | POA: Diagnosis not present

## 2019-03-22 DIAGNOSIS — I10 Essential (primary) hypertension: Secondary | ICD-10-CM | POA: Diagnosis not present

## 2019-03-22 DIAGNOSIS — M2550 Pain in unspecified joint: Secondary | ICD-10-CM | POA: Diagnosis not present

## 2019-03-22 DIAGNOSIS — I480 Paroxysmal atrial fibrillation: Secondary | ICD-10-CM | POA: Insufficient documentation

## 2019-03-22 DIAGNOSIS — S82434A Nondisplaced oblique fracture of shaft of right fibula, initial encounter for closed fracture: Secondary | ICD-10-CM | POA: Diagnosis not present

## 2019-03-22 DIAGNOSIS — Y92099 Unspecified place in other non-institutional residence as the place of occurrence of the external cause: Secondary | ICD-10-CM | POA: Insufficient documentation

## 2019-03-22 DIAGNOSIS — W19XXXA Unspecified fall, initial encounter: Secondary | ICD-10-CM | POA: Insufficient documentation

## 2019-03-22 DIAGNOSIS — R4182 Altered mental status, unspecified: Secondary | ICD-10-CM | POA: Diagnosis not present

## 2019-03-22 DIAGNOSIS — I5043 Acute on chronic combined systolic (congestive) and diastolic (congestive) heart failure: Secondary | ICD-10-CM | POA: Insufficient documentation

## 2019-03-22 DIAGNOSIS — S99911A Unspecified injury of right ankle, initial encounter: Secondary | ICD-10-CM | POA: Diagnosis present

## 2019-03-22 DIAGNOSIS — R52 Pain, unspecified: Secondary | ICD-10-CM | POA: Diagnosis not present

## 2019-03-22 DIAGNOSIS — Z03818 Encounter for observation for suspected exposure to other biological agents ruled out: Secondary | ICD-10-CM | POA: Diagnosis not present

## 2019-03-22 NOTE — ED Notes (Signed)
[facility] called for transport. Nephew made aware. Paper work in [place] at [facility].

## 2019-03-22 NOTE — ED Notes (Signed)
Pt verbalized understanding of DC instructions as well as pts Nephew. [facility] transported pt home with belongings.

## 2019-03-22 NOTE — ED Provider Notes (Signed)
[facility] DEPT Provider Note   CSN: [id number] Arrival date & time: 03/22/19  0725     History Chief Complaint  Patient presents with  . Fall  . Ankle Pain    Cheryl Zamora is a 86 y.o. female.  86 year old female history of A. fib on Xarelto here for evaluation of ankle pain after a fall 2 days ago No reported LOC.  Level 5 caveat secondary to memory loss.  Patient denies any complaints.  She does not recall the fall.  The history is provided by the patient and the EMS personnel.  Fall This is a new problem. The current episode started 2 days ago. The problem has not changed since onset.Pertinent negatives include no chest pain, no abdominal pain, no headaches and no shortness of breath. The symptoms are aggravated by standing. Nothing relieves the symptoms. She has tried nothing for the symptoms. The treatment provided no relief.  Ankle Pain Location:  Ankle Time since incident:  2 days Injury: yes   Mechanism of injury: fall   Ankle location:  R ankle Pain details:    Quality:  Unable to specify   Severity:  Unable to specify   Onset quality:  Unable to specify Relieved by:  Nothing Worsened by:  Bearing weight Associated symptoms: no fever        Past Medical History:  Diagnosis Date  . Atrial fibrillation with rapid ventricular response ([place]) 11/2012; 10/2013   a. s/p TEE/DCCV 11/2012 & 10/2013 b. amiodarone caused significant QT prlongation (>664msec)  . Chronic combined systolic and diastolic CHF (congestive heart failure) ([place])   . Coronary artery disease 2005   a. s/p CABG (2012)  . GERD (gastroesophageal reflux disease)   . Hyperlipidemia    pt denies this hx on 01/03/2015  . Hypertension   . Neuropathy   . Osteoarthritis of back     Patient Active Problem List   Diagnosis Date Noted  . Palliative care by specialist   . DNR (do not resuscitate) discussion   . Persistent atrial fibrillation ([place])   . CHF (congestive heart  failure) ([place]) 11/09/2016  . DVT (deep venous thrombosis) ([place]) 01/13/2015  . Cellulitis of both lower extremities 01/13/2015  . Cellulitis 01/13/2015  . Acute deep vein thrombosis (DVT) of popliteal vein of left lower extremity ([place])   . Hematuria   . Acute on chronic renal insufficiency 01/06/2015  . Atrial fibrillation with RVR ([place])   . Sepsis ([place]) 01/02/2015  . UTI (lower urinary tract infection) 01/02/2015  . Paroxysmal atrial fibrillation ([facility])   . Chronic diastolic (congestive) heart failure ([place])   . Atrial fibrillation with rapid ventricular response ([place])   . Acute on chronic combined systolic and diastolic congestive heart failure ([place]) 04/12/2014  . CKD (chronic kidney disease) stage 3, GFR 30-59 ml/min 04/11/2014  . CAD, multiple vessel, hx CABG 2012 04/11/2014  . Long term current use of anticoagulant therapy 12/21/2012  . Physical deconditioning 12/17/2012  . PAF (paroxysmal atrial fibrillation) ([place]) 12/12/2012  . Chronic diastolic CHF (congestive heart failure), NYHA class 3 ([place]) 12/12/2012  . Essential hypertension   . Neuropathy     Past Surgical History:  Procedure Laterality Date  . ABDOMINAL HYSTERECTOMY    . AV NODE ABLATION N/A 11/16/2016   Procedure: AV Node Ablation;  Surgeon: Constance Haw, MD;  Location: [facility];  Service: Cardiovascular;  Laterality: N/A;  . BACK SURGERY    . CARDIAC CATHETERIZATION  04/12/2003  [place] 60, LAD 90, D1 30, CFX 95->T, RCA OK  . CARDIOVERSION N/A 12/13/2012   Procedure: CARDIOVERSION;  Surgeon: Larey Dresser, MD;  Location: [facility];  Service: Cardiovascular;  Laterality: N/A;  . CARDIOVERSION N/A 11/13/2013   Procedure: CARDIOVERSION;  Surgeon: Satomi Spark, MD;  Location: [facility] ENDOSCOPY;  Service: Cardiovascular;  Laterality: N/A;  . CARDIOVERSION N/A 04/12/2014   Procedure: CARDIOVERSION;  Surgeon: Thayer Headings, MD;  Location: [facility] ENDOSCOPY;  Service: Cardiovascular;  Laterality: N/A;  .  CARDIOVERSION N/A 01/06/2015   Procedure: CARDIOVERSION;  Surgeon: Larey Dresser, MD;  Location: [facility];  Service: Cardiovascular;  Laterality: N/A;  . CARDIOVERSION N/A 11/11/2016   Procedure: CARDIOVERSION;  Surgeon: Josue Hector, MD;  Location: [facility] ENDOSCOPY;  Service: Cardiovascular;  Laterality: N/A;  . [place]  1960's   "ruptured disc'  . CORONARY ANGIOPLASTY    . CORONARY ARTERY BYPASS GRAFT  04/18/2003   x 4; LIMA-LAD, SVG-D1, SVG-OM1-OM2  . PACEMAKER IMPLANT N/A 11/16/2016   Procedure: Pacemaker Implant;  Surgeon: Constance Haw, MD;  Location: [place] [facility];  Service: Cardiovascular;  Laterality: N/A;  . TEE WITHOUT CARDIOVERSION N/A 12/13/2012   Procedure: TRANSESOPHAGEAL ECHOCARDIOGRAM (TEE);  Surgeon: Larey Dresser, MD;  Location: [facility] ENDOSCOPY;  Service: Cardiovascular;  Laterality: N/A;  . TEE WITHOUT CARDIOVERSION N/A 11/13/2013   Procedure: TRANSESOPHAGEAL ECHOCARDIOGRAM (TEE);  Surgeon: Magdaline Spark, MD;  Location: [facility];  Service: Cardiovascular;  Laterality: N/A;  . TEE WITHOUT CARDIOVERSION N/A 11/11/2016   Procedure: TRANSESOPHAGEAL ECHOCARDIOGRAM (TEE);  Surgeon: Josue Hector, MD;  Location: [facility] ENDOSCOPY;  Service: Cardiovascular;  Laterality: N/A;  . TONSILLECTOMY       OB History   No obstetric history on file.     Family History  Problem Relation Age of Onset  . Heart attack Mother   . Heart attack Father   . Heart attack Brother   . Heart attack Brother   . Heart attack Brother   . Heart attack Brother     Social History   Tobacco Use  . Smoking status: Former Smoker    Packs/day: 1.00    Years: 60.00    Pack years: 60.00    Types: Cigarettes  . Smokeless tobacco: Never Used  Substance Use Topics  . Alcohol use: Yes    Comment: 01/03/2015 "might have a beer a couple times/yr"  . Drug use: No    Home Medications Prior to Admission medications   Medication Sig Start Date End Date Taking?  Authorizing Provider  acetaminophen (TYLENOL) 325 MG tablet Take 650 mg by mouth every 6 (six) hours as needed for mild pain.    [provider]  cefdinir (OMNICEF) 300 MG capsule Take 300 mg by mouth 2 (two) times daily. 01/09/19   [provider]  Cholecalciferol (VITAMIN D3) 1000 units CAPS Take 1,000 Units by mouth daily.    [provider]  ciprofloxacin (CIPRO) 500 MG tablet Take 500 mg by mouth 2 (two) times daily. 02/19/19   [provider]  doxycycline (VIBRA-TABS) 100 MG tablet Take 1 tablet (100 mg total) by mouth 2 (two) times daily. 03/15/19   Trula Slade, DPM  furosemide (LASIX) 40 MG tablet Take 1 tablet (40 mg total) by mouth daily. NEED OV. 10/07/15   Martinique, Peter M, MD  memantine (NAMENDA) 10 MG tablet Take 10 mg by mouth daily.     [provider]  nitrofurantoin, macrocrystal-monohydrate, (MACROBID) 100 MG  capsule Take 100 mg by mouth 2 (two) times daily. 09/22/18   [provider]  potassium chloride SA (K-DUR,KLOR-CON) 20 MEQ tablet TAKE 1 TABLET(20 MEQ) BY MOUTH DAILY 07/10/15   Martinique, Peter M, MD  XARELTO 15 MG TABS tablet Take 15 mg by mouth daily.  09/15/16   [provider]    Allergies    Digoxin and related, Amiodarone, Codeine, and Penicillins  Review of Systems   Review of Systems  Constitutional: Negative for fever.  HENT: Negative for sore throat.   Eyes: Negative for visual disturbance.  Respiratory: Negative for shortness of breath.   Cardiovascular: Negative for chest pain.  Gastrointestinal: Negative for abdominal pain.  Genitourinary: Negative for dysuria.  Musculoskeletal: Positive for gait problem.  Skin: Negative for rash.  Neurological: Negative for headaches.    Physical Exam Updated Vital Signs BP (!) 174/108   Pulse 81   Temp 97.7 F (36.5 C) (Oral)   Resp 16   SpO2 98%   Physical Exam Vitals and nursing note reviewed.  Constitutional:      General: She is not in  acute distress.    Appearance: She is well-developed.  HENT:     Head: Normocephalic and atraumatic.  Eyes:     Conjunctiva/sclera: Conjunctivae normal.  Cardiovascular:     Rate and Rhythm: Normal rate and regular rhythm.     Heart sounds: No murmur.  Pulmonary:     Effort: Pulmonary effort is normal. No respiratory distress.     Breath sounds: Normal breath sounds.  Abdominal:     Palpations: Abdomen is soft.     Tenderness: There is no abdominal tenderness.  Musculoskeletal:        General: Tenderness and signs of injury present.     Cervical back: Neck supple.     Right lower leg: No edema.     Left lower leg: No edema.     Comments: Nontender full range of motion upper extremities without any pain or limitation.  Nontender and full range of motion left lower extremity.  Tender right lower extremity at ankle.  Knee and hip nontender.  No obvious deformities.  No overlying bruising.  Skin:    General: Skin is warm and dry.     Capillary Refill: Capillary refill takes less than 2 seconds.  Neurological:     General: No focal deficit present.     Mental Status: She is alert. Mental status is at baseline.     ED Results / Procedures / Treatments   Labs (all labs ordered are listed, but only abnormal results are displayed) Labs Reviewed - No data to display  EKG None  Radiology DG Ankle Complete Right  Result Date: 03/22/2019 CLINICAL DATA:  Fall 2 days ago with right ankle pain. EXAM: RIGHT ANKLE - COMPLETE 3+ VIEW COMPARISON:  None. FINDINGS: There is diffuse osteopenia. There is a mildly displaced oblique fracture of the distal fibular metaphysis at and above the level of the ankle mortise. Remainder the exam is unremarkable. IMPRESSION: Minimally displaced oblique fracture of the distal fibula. Electronically Signed   By: Marin Olp M.D.   On: 03/22/2019 08:12   CT Head Wo Contrast  Result Date: 03/22/2019 CLINICAL DATA:  Recent fall EXAM: CT HEAD WITHOUT CONTRAST  TECHNIQUE: Contiguous axial images were obtained from the base of the skull through the vertex without intravenous contrast. COMPARISON:  None. FINDINGS: Brain: There is moderate diffuse atrophy. There is no intracranial mass, hemorrhage, extra-axial fluid collection,  or midline shift. There is small vessel disease throughout the centra semiovale bilaterally. No acute infarct is demonstrable on this study. Vascular: There is no hyperdense vessel. There is calcification in each distal vertebral artery and in the carotid siphon regions bilaterally. Skull: The bony calvarium appears intact. Sinuses/Orbits: There is mucosal thickening in multiple ethmoid air cells. Orbits appear symmetric bilaterally. Other: Mastoid air cells are clear. IMPRESSION: Atrophy with periventricular small vessel disease. No acute infarct. No mass or hemorrhage. There are multiple foci of arterial vascular calcification. There is mucosal thickening in multiple ethmoid air cells. Electronically Signed   By: Lowella Grip III M.D.   On: 03/22/2019 08:20    Procedures Procedures (including critical care time)  Medications Ordered in ED Medications - No data to display  ED Course  I have reviewed the triage vital signs and the nursing notes.  Pertinent labs & imaging results that were available during my care of the patient were reviewed by me and considered in my medical decision making (see chart for details).  Clinical Course as of Mar 21 1200  Thu Mar 22, 5923  975 86 year old here for evaluation of right ankle pain after a fall 2 days ago.  Differential includes head bleed, skull fracture, ankle fracture, sprain, dislocation.   [MB]  [id number] Discussed with Dr. Ninfa Linden orthopedics who recommends a boot and weightbearing as tolerated.   [MB]  0827 Ankle x-ray interpreted by me as nondisplaced fibular fracture.  Head CT interpreted as no acute bleed.  Awaiting radiology reading.   [MB]  [phone] I called the patient's nephew  who she lives with.  He said she is fallen multiple times and he has to keep picking her up and it is getting more difficult to have her at home.  Have placed TOC consult   [MB]  1128 Social work consulted and has evaluated the patient.  They are recommending that she be discharged back home with a home health RN and aide   [MB]    Clinical Course User Index [MB] Hayden Rasmussen, MD   MDM Rules/Calculators/A&P                       Final Clinical Impression(s) / ED Diagnoses Final diagnoses:  Closed nondisplaced oblique fracture of shaft of right fibula, initial encounter  Fall in home, initial encounter    Rx / DC Orders ED Discharge Orders    None       Hayden Rasmussen, MD 03/22/19 1202

## 2019-03-22 NOTE — ED Notes (Signed)
PTAR at bedside. Pt belongings and pt transported to home address. Nephew called and made aware to meet pt at house.

## 2019-03-22 NOTE — Discharge Instructions (Signed)
You were evaluated in the emergency department for injuries from a fall.  You broke your right fibula which is a bone at the ankle.  Orthopedics was consulted and they recommended a walking boot and weightbearing as tolerated.  Social work also saw you and recommended setting you up with a home nurse and aide.  Please follow-up with Dr. Ninfa Linden orthopedics in 1 to 2 weeks.  Return to the emergency department if any worsening symptoms.

## 2019-03-22 NOTE — Progress Notes (Signed)
Virtual Visit via Video Note  I connected with Cheryl Zamora on 03/14/2018 at 11:00 AM EST by a video enabled telemedicine application and verified that I am speaking with the correct person using two identifiers.  Location: Patient: Home with her nephew/POA present Phil who assisted and answered the majority of the questions.  Provider: Office   I discussed the limitations of evaluation and management by telemedicine and the availability of in person appointments. The patient expressed understanding and agreed to proceed.  History of Present Illness: 86 year old female has a history and current cleaning her left big toe which is healing as well as her left second toe.  The nephew has not noticed any odor or any drainage.  There is scab has been ongoing for last 2 weeks.  The area was previously red and swollen but this has improved as well as the wound does seem to be getting better.  They deny any fevers, chills.  Does get occasional discomfort.  No recent injury or falls.  They have been cleaning the ear with peroxide and disinfectant.   Observations/Objective: Is difficult to see the wound on the video.  There it appears to be an area of superficial skin breakdown on the left first and second toes.  Not able to appreciate any drainage or pus but does seem mildly erythematous around the area.  No red streaks I can appreciate but again difficult to fully evaluate on the video.  Assessment and Plan: Healing ulcerations with localized erythema  Continue to wash the areas with soap and water daily and dry thoroughly.  Apply a small amount of antibiotic ointment dressing daily.  Prescribed doxycycline.  Offloading.  Monitoring signs or symptoms of worsening infection directed to the ER for further evaluation should any symptoms occur.   Follow Up Instructions: Would like to follow-up next week.   I discussed the assessment and treatment plan with the patient. The patient was provided an  opportunity to ask questions and all were answered. The patient agreed with the plan and demonstrated an understanding of the instructions.   The patient was advised to call back or seek an in-person evaluation if the symptoms worsen or if the condition fails to improve as anticipated.  I provided 15 minutes of non-face-to-face time during this encounter.   Trula Slade, DPM

## 2019-03-22 NOTE — ED Triage Notes (Signed)
EMS reports from home, called for fall on tuesday, right ankle pain. Pt having difficulty ambulating. No deformity noted. Denies striking head or other pain. Pt on blood thinners (Xarelto). Pt has pacemker.  BP 130/70 HR 95 RR 16 Sp02 98 RA Temp 97.7

## 2019-03-22 NOTE — Progress Notes (Signed)
CSW received called from Cheryl Zamora with [facility] [phone]) who reports they received orders for patient to start PT and OT on tomorrow. Cheryl Zamora reports she has spoken with patient's nephew/caretaker and believes that from her conversation with him that patient would prefer to go home rather than be placed for SNF. CSW spoke with patient's nephew Cheryl Zamora who is patient's caretaker. Cheryl Zamora reports that he knows patient will want to come home. Cheryl Zamora reports he is agreeable with patient coming home since she will be starting [facility] tomorrow. Since patient is going back home and only has orders for PT and OT, CSW to request EDP to add orders for RN and nurse aid for additional support. CSW will have RN set up PTAR for patient to go back home.   [place], LCSW Transitions of Care Department [facility] ED [phone]

## 2019-03-23 ENCOUNTER — Telehealth: Payer: Medicare Other | Admitting: Podiatry

## 2019-03-23 ENCOUNTER — Other Ambulatory Visit: Payer: Self-pay

## 2019-03-23 ENCOUNTER — Emergency Department (HOSPITAL_COMMUNITY): Payer: Medicare Other

## 2019-03-23 ENCOUNTER — Inpatient Hospital Stay (HOSPITAL_COMMUNITY)
Admission: EM | Admit: 2019-03-23 | Discharge: 2019-03-29 | DRG: 694 | Disposition: A | Discharge status: Skilled Nursing Facility | Payer: Medicare Other | Attending: Family Medicine | Admitting: Family Medicine

## 2019-03-23 DIAGNOSIS — Z885 Allergy status to narcotic agent status: Secondary | ICD-10-CM | POA: Diagnosis not present

## 2019-03-23 DIAGNOSIS — K219 Gastro-esophageal reflux disease without esophagitis: Secondary | ICD-10-CM | POA: Diagnosis not present

## 2019-03-23 DIAGNOSIS — N3000 Acute cystitis without hematuria: Secondary | ICD-10-CM

## 2019-03-23 DIAGNOSIS — E785 Hyperlipidemia, unspecified: Secondary | ICD-10-CM | POA: Diagnosis present

## 2019-03-23 DIAGNOSIS — I509 Heart failure, unspecified: Secondary | ICD-10-CM | POA: Diagnosis not present

## 2019-03-23 DIAGNOSIS — I10 Essential (primary) hypertension: Secondary | ICD-10-CM | POA: Diagnosis present

## 2019-03-23 DIAGNOSIS — R8291 Other chromoabnormalities of urine: Secondary | ICD-10-CM | POA: Diagnosis not present

## 2019-03-23 DIAGNOSIS — I130 Hypertensive heart and chronic kidney disease with heart failure and stage 1 through stage 4 chronic kidney disease, or unspecified chronic kidney disease: Secondary | ICD-10-CM | POA: Diagnosis present

## 2019-03-23 DIAGNOSIS — G629 Polyneuropathy, unspecified: Secondary | ICD-10-CM | POA: Diagnosis not present

## 2019-03-23 DIAGNOSIS — Z86718 Personal history of other venous thrombosis and embolism: Secondary | ICD-10-CM | POA: Diagnosis not present

## 2019-03-23 DIAGNOSIS — R911 Solitary pulmonary nodule: Secondary | ICD-10-CM | POA: Diagnosis not present

## 2019-03-23 DIAGNOSIS — I4821 Permanent atrial fibrillation: Secondary | ICD-10-CM | POA: Diagnosis not present

## 2019-03-23 DIAGNOSIS — N1832 Chronic kidney disease, stage 3b: Secondary | ICD-10-CM | POA: Diagnosis not present

## 2019-03-23 DIAGNOSIS — R262 Difficulty in walking, not elsewhere classified: Secondary | ICD-10-CM | POA: Diagnosis not present

## 2019-03-23 DIAGNOSIS — N200 Calculus of kidney: Secondary | ICD-10-CM

## 2019-03-23 DIAGNOSIS — R319 Hematuria, unspecified: Secondary | ICD-10-CM | POA: Diagnosis present

## 2019-03-23 DIAGNOSIS — M6281 Muscle weakness (generalized): Secondary | ICD-10-CM | POA: Diagnosis not present

## 2019-03-23 DIAGNOSIS — I480 Paroxysmal atrial fibrillation: Secondary | ICD-10-CM | POA: Diagnosis present

## 2019-03-23 DIAGNOSIS — R410 Disorientation, unspecified: Secondary | ICD-10-CM | POA: Diagnosis not present

## 2019-03-23 DIAGNOSIS — N132 Hydronephrosis with renal and ureteral calculous obstruction: Principal | ICD-10-CM | POA: Diagnosis present

## 2019-03-23 DIAGNOSIS — R519 Headache, unspecified: Secondary | ICD-10-CM | POA: Diagnosis not present

## 2019-03-23 DIAGNOSIS — I5032 Chronic diastolic (congestive) heart failure: Secondary | ICD-10-CM | POA: Diagnosis not present

## 2019-03-23 DIAGNOSIS — R296 Repeated falls: Secondary | ICD-10-CM | POA: Diagnosis not present

## 2019-03-23 DIAGNOSIS — W19XXXD Unspecified fall, subsequent encounter: Secondary | ICD-10-CM | POA: Diagnosis present

## 2019-03-23 DIAGNOSIS — R310 Gross hematuria: Secondary | ICD-10-CM | POA: Diagnosis not present

## 2019-03-23 DIAGNOSIS — Z888 Allergy status to other drugs, medicaments and biological substances status: Secondary | ICD-10-CM | POA: Diagnosis not present

## 2019-03-23 DIAGNOSIS — Z03818 Encounter for observation for suspected exposure to other biological agents ruled out: Secondary | ICD-10-CM | POA: Diagnosis not present

## 2019-03-23 DIAGNOSIS — E569 Vitamin deficiency, unspecified: Secondary | ICD-10-CM | POA: Diagnosis not present

## 2019-03-23 DIAGNOSIS — N1330 Unspecified hydronephrosis: Secondary | ICD-10-CM | POA: Diagnosis not present

## 2019-03-23 DIAGNOSIS — I5042 Chronic combined systolic (congestive) and diastolic (congestive) heart failure: Secondary | ICD-10-CM | POA: Diagnosis present

## 2019-03-23 DIAGNOSIS — Z880 Allergy status to penicillin: Secondary | ICD-10-CM | POA: Diagnosis not present

## 2019-03-23 DIAGNOSIS — I495 Sick sinus syndrome: Secondary | ICD-10-CM | POA: Diagnosis present

## 2019-03-23 DIAGNOSIS — S82839D Other fracture of upper and lower end of unspecified fibula, subsequent encounter for closed fracture with routine healing: Secondary | ICD-10-CM

## 2019-03-23 DIAGNOSIS — Z66 Do not resuscitate: Secondary | ICD-10-CM | POA: Diagnosis not present

## 2019-03-23 DIAGNOSIS — N189 Chronic kidney disease, unspecified: Secondary | ICD-10-CM | POA: Diagnosis not present

## 2019-03-23 DIAGNOSIS — Z7401 Bed confinement status: Secondary | ICD-10-CM | POA: Diagnosis not present

## 2019-03-23 DIAGNOSIS — W19XXXA Unspecified fall, initial encounter: Secondary | ICD-10-CM

## 2019-03-23 DIAGNOSIS — N179 Acute kidney failure, unspecified: Secondary | ICD-10-CM | POA: Diagnosis not present

## 2019-03-23 DIAGNOSIS — Z950 Presence of cardiac pacemaker: Secondary | ICD-10-CM

## 2019-03-23 DIAGNOSIS — F0150 Vascular dementia without behavioral disturbance: Secondary | ICD-10-CM | POA: Diagnosis not present

## 2019-03-23 DIAGNOSIS — I2510 Atherosclerotic heart disease of native coronary artery without angina pectoris: Secondary | ICD-10-CM | POA: Diagnosis not present

## 2019-03-23 DIAGNOSIS — S82401D Unspecified fracture of shaft of right fibula, subsequent encounter for closed fracture with routine healing: Secondary | ICD-10-CM | POA: Diagnosis not present

## 2019-03-23 DIAGNOSIS — I517 Cardiomegaly: Secondary | ICD-10-CM | POA: Diagnosis not present

## 2019-03-23 DIAGNOSIS — R918 Other nonspecific abnormal finding of lung field: Secondary | ICD-10-CM

## 2019-03-23 DIAGNOSIS — M542 Cervicalgia: Secondary | ICD-10-CM | POA: Diagnosis not present

## 2019-03-23 DIAGNOSIS — Z515 Encounter for palliative care: Secondary | ICD-10-CM | POA: Diagnosis not present

## 2019-03-23 DIAGNOSIS — C342 Malignant neoplasm of middle lobe, bronchus or lung: Secondary | ICD-10-CM | POA: Diagnosis present

## 2019-03-23 DIAGNOSIS — Z7189 Other specified counseling: Secondary | ICD-10-CM | POA: Diagnosis not present

## 2019-03-23 DIAGNOSIS — Z20822 Contact with and (suspected) exposure to covid-19: Secondary | ICD-10-CM | POA: Diagnosis not present

## 2019-03-23 DIAGNOSIS — Z87891 Personal history of nicotine dependence: Secondary | ICD-10-CM

## 2019-03-23 DIAGNOSIS — Z7901 Long term (current) use of anticoagulants: Secondary | ICD-10-CM | POA: Diagnosis not present

## 2019-03-23 DIAGNOSIS — Z8249 Family history of ischemic heart disease and other diseases of the circulatory system: Secondary | ICD-10-CM

## 2019-03-23 DIAGNOSIS — F0390 Unspecified dementia without behavioral disturbance: Secondary | ICD-10-CM | POA: Diagnosis present

## 2019-03-23 DIAGNOSIS — Z951 Presence of aortocoronary bypass graft: Secondary | ICD-10-CM | POA: Diagnosis not present

## 2019-03-23 DIAGNOSIS — N183 Chronic kidney disease, stage 3 unspecified: Secondary | ICD-10-CM | POA: Diagnosis present

## 2019-03-23 DIAGNOSIS — M2550 Pain in unspecified joint: Secondary | ICD-10-CM | POA: Diagnosis not present

## 2019-03-23 DIAGNOSIS — S199XXA Unspecified injury of neck, initial encounter: Secondary | ICD-10-CM | POA: Diagnosis not present

## 2019-03-23 LAB — RESPIRATORY PANEL BY RT PCR (FLU A&B, COVID)
Influenza A by PCR: NEGATIVE
Influenza B by PCR: NEGATIVE
SARS Coronavirus 2 by RT PCR: NEGATIVE

## 2019-03-23 NOTE — ED Provider Notes (Signed)
[facility] DEPT Provider Note   CSN: [id number] Arrival date & time: 03/23/19  0316     History Chief complaint: Cheryl Zamora is a 86 y.o. female with a hx of afib on eliquis & memory loss who presents to the ED via EMS from home for evaluation s/p fall. Patient does not recall the fall, she states she is only currently having pain in her L ankle. History provided by patient's nephew who is her healthcare power of attorney- he states he is concerned that patient needs placement. She has been having frequent falls. Seen yesterday s/p fall & found to have a minimally displaced oblique fracture of the distal fibula - per orthopedics could be weightbearing as tolerated in CAM walker, social work was consulted--> plan was discharge home with with home health RN & aide, this is scheduled for today 03/23/19. Per patient's nephew she is unable to bearweight with her walker, he has had to pick her up and carry her places, tonight he bought her depends so that she would not get out of bed to go to the bathroom, but reportedly got up and fell. She called him and he could see her via video on the floor & lives 10 minutes [place] the [place] and came to check on her. He feels she needs placement as she lives at home alone and is unable to ambulate.   Level 5 caveat secondary to memory loss.     HPI     Past Medical History:  Diagnosis Date  . Atrial fibrillation with rapid ventricular response ([place]) 11/2012; 10/2013   a. s/p TEE/DCCV 11/2012 & 10/2013 b. amiodarone caused significant QT prlongation (>629msec)  . Chronic combined systolic and diastolic CHF (congestive heart failure) ([place])   . Coronary artery disease 2005   a. s/p CABG (2012)  . GERD (gastroesophageal reflux disease)   . Hyperlipidemia    pt denies this hx on 01/03/2015  . Hypertension   . Neuropathy   . Osteoarthritis of back     Patient Active Problem List   Diagnosis Date Noted  . Palliative  care by specialist   . DNR (do not resuscitate) discussion   . Persistent atrial fibrillation ([place])   . CHF (congestive heart failure) ([place]) 11/09/2016  . DVT (deep venous thrombosis) ([place]) 01/13/2015  . Cellulitis of both lower extremities 01/13/2015  . Cellulitis 01/13/2015  . Acute deep vein thrombosis (DVT) of popliteal vein of left lower extremity ([place])   . Hematuria   . Acute on chronic renal insufficiency 01/06/2015  . Atrial fibrillation with RVR ([place])   . Sepsis ([place]) 01/02/2015  . UTI (lower urinary tract infection) 01/02/2015  . Paroxysmal atrial fibrillation ([facility])   . Chronic diastolic (congestive) heart failure ([place])   . Atrial fibrillation with rapid ventricular response ([place])   . Acute on chronic combined systolic and diastolic congestive heart failure ([place]) 04/12/2014  . CKD (chronic kidney disease) stage 3, GFR 30-59 ml/min 04/11/2014  . CAD, multiple vessel, hx CABG 2012 04/11/2014  . Long term current use of anticoagulant therapy 12/21/2012  . Physical deconditioning 12/17/2012  . PAF (paroxysmal atrial fibrillation) ([place]) 12/12/2012  . Chronic diastolic CHF (congestive heart failure), NYHA class 3 ([place]) 12/12/2012  . Essential hypertension   . Neuropathy     Past Surgical History:  Procedure Laterality Date  . ABDOMINAL HYSTERECTOMY    . AV NODE ABLATION N/A 11/16/2016   Procedure: AV Node Ablation;  Surgeon: Curt Bears, Will  Hassell Done, MD;  Location: [facility];  Service: Cardiovascular;  Laterality: N/A;  . BACK SURGERY    . CARDIAC CATHETERIZATION  04/12/2003   LMain 60, LAD 90, D1 30, CFX 95->T, RCA OK  . CARDIOVERSION N/A 12/13/2012   Procedure: CARDIOVERSION;  Surgeon: Larey Dresser, MD;  Location: [facility];  Service: Cardiovascular;  Laterality: N/A;  . CARDIOVERSION N/A 11/13/2013   Procedure: CARDIOVERSION;  Surgeon: Alysah Spark, MD;  Location: [facility] ENDOSCOPY;  Service: Cardiovascular;  Laterality: N/A;  . CARDIOVERSION N/A 04/12/2014    Procedure: CARDIOVERSION;  Surgeon: Thayer Headings, MD;  Location: [facility] ENDOSCOPY;  Service: Cardiovascular;  Laterality: N/A;  . CARDIOVERSION N/A 01/06/2015   Procedure: CARDIOVERSION;  Surgeon: Larey Dresser, MD;  Location: [facility];  Service: Cardiovascular;  Laterality: N/A;  . CARDIOVERSION N/A 11/11/2016   Procedure: CARDIOVERSION;  Surgeon: Josue Hector, MD;  Location: [facility] ENDOSCOPY;  Service: Cardiovascular;  Laterality: N/A;  . [place]  1960's   "ruptured disc'  . CORONARY ANGIOPLASTY    . CORONARY ARTERY BYPASS GRAFT  04/18/2003   x 4; LIMA-LAD, SVG-D1, SVG-OM1-OM2  . PACEMAKER IMPLANT N/A 11/16/2016   Procedure: Pacemaker Implant;  Surgeon: Constance Haw, MD;  Location: [facility];  Service: Cardiovascular;  Laterality: N/A;  . TEE WITHOUT CARDIOVERSION N/A 12/13/2012   Procedure: TRANSESOPHAGEAL ECHOCARDIOGRAM (TEE);  Surgeon: Larey Dresser, MD;  Location: [facility] ENDOSCOPY;  Service: Cardiovascular;  Laterality: N/A;  . TEE WITHOUT CARDIOVERSION N/A 11/13/2013   Procedure: TRANSESOPHAGEAL ECHOCARDIOGRAM (TEE);  Surgeon: Germaine Spark, MD;  Location: [facility];  Service: Cardiovascular;  Laterality: N/A;  . TEE WITHOUT CARDIOVERSION N/A 11/11/2016   Procedure: TRANSESOPHAGEAL ECHOCARDIOGRAM (TEE);  Surgeon: Josue Hector, MD;  Location: [facility] ENDOSCOPY;  Service: Cardiovascular;  Laterality: N/A;  . TONSILLECTOMY       OB History   No obstetric history on file.     Family History  Problem Relation Age of Onset  . Heart attack Mother   . Heart attack Father   . Heart attack Brother   . Heart attack Brother   . Heart attack Brother   . Heart attack Brother     Social History   Tobacco Use  . Smoking status: Former Smoker    Packs/day: 1.00    Years: 60.00    Pack years: 60.00    Types: Cigarettes  . Smokeless tobacco: Never Used  Substance Use Topics  . Alcohol use: Yes    Comment: 01/03/2015 "might have a beer a couple  times/yr"  . Drug use: No    Home Medications Prior to Admission medications   Medication Sig Start Date End Date Taking? Authorizing Provider  acetaminophen (TYLENOL) 325 MG tablet Take 650 mg by mouth every 6 (six) hours as needed for mild pain.    [provider]  cefdinir (OMNICEF) 300 MG capsule Take 300 mg by mouth 2 (two) times daily. 01/09/19   [provider]  Cholecalciferol (VITAMIN D3) 1000 units CAPS Take 1,000 Units by mouth daily.    [provider]  doxycycline (VIBRA-TABS) 100 MG tablet Take 1 tablet (100 mg total) by mouth 2 (two) times daily. Patient not taking: Reported on 03/22/2019 03/15/19   Trula Slade, DPM  furosemide (LASIX) 40 MG tablet Take 1 tablet (40 mg total) by mouth daily. NEED OV. Patient taking differently: Take 40 mg by mouth daily.  10/07/15   [place]ique, Peter M, MD  memantine (  NAMENDA) 10 MG tablet Take 10 mg by mouth daily.     [provider]  potassium chloride SA (K-DUR,KLOR-CON) 20 MEQ tablet TAKE 1 TABLET(20 MEQ) BY MOUTH DAILY Patient taking differently: Take 20 mEq by mouth daily. TAKE 1 TABLET(20 MEQ) BY MOUTH DAILY 07/10/15   [place], Peter M, MD  XARELTO 15 MG TABS tablet Take 15 mg by mouth daily at 2 PM.  09/15/16   [provider]    Allergies    Digoxin and related, Amiodarone, Codeine, and Penicillins  Review of Systems   Review of Systems  Unable to perform ROS: Other (Age/memory loss)    Physical Exam Updated Vital Signs BP (!) 192/87 (BP Location: Right Arm)   Pulse 77   Temp 98.1 F (36.7 C) (Oral)   Resp 18   SpO2 94%   Physical Exam Vitals and nursing note reviewed.  Constitutional:      General: She is not in acute distress.    Appearance: She is well-developed.  HENT:     Head: Normocephalic and atraumatic. No raccoon eyes or Battle's sign.     Right Ear: No hemotympanum.     Left Ear: No hemotympanum.  Eyes:     General:        Right eye: No discharge.        Left  eye: No discharge.     Conjunctiva/sclera: Conjunctivae normal.     Pupils: Pupils are equal, round, and reactive to light.  Cardiovascular:     Rate and Rhythm: Normal rate and regular rhythm.     Pulses:          Dorsalis pedis pulses are 2+ on the right side and 2+ on the left side.     Heart sounds: No murmur.  Pulmonary:     Effort: No respiratory distress.     Breath sounds: Normal breath sounds. No wheezing or rales.  Chest:     Chest wall: No tenderness.  Abdominal:     General: There is no distension.     Palpations: Abdomen is soft.     Tenderness: There is no abdominal tenderness. There is no guarding or rebound.  Musculoskeletal:     Cervical back: Neck supple. Spinous process tenderness (Diffuse, no point/focal vertebral tenderness or palpable step-off.) present.     Comments: Back: Diffuse tenderness to the midline thoracic and lumbar regions without point/focal vertebral tenderness or palpable step-off. Upper extremities: Intact active range of motion.  No point/focal bony tenderness Lower extremities: Cam walker in place to right lower extremity, this was removed for exam.  Intact active range of motion with the exception of right ankle limitation secondary to pain.  [name] tender to the distal one third of the right fibula.  Lower extremities otherwise nontender.  No pelvic tenderness or instability to palpation.  Skin:    General: Skin is warm and dry.     Findings: No rash.  Neurological:     Mental Status: She is alert.     Comments: Alert.  Sensation grossly intact x4.  5 out of 5 symmetric grip strength and strength with plantar dorsiflexion bilaterally.  Psychiatric:        Behavior: Behavior normal.     ED Results / Procedures / Treatments   Labs (all labs ordered are listed, but only abnormal results are displayed) Labs Reviewed - No data to display  EKG None  Radiology DG Ankle Complete Right  Result Date: 03/22/2019 CLINICAL DATA:  Fall 2  days ago with right ankle pain. EXAM: RIGHT ANKLE - COMPLETE 3+ VIEW COMPARISON:  None. FINDINGS: There is diffuse osteopenia. There is a mildly displaced oblique fracture of the distal fibular metaphysis at and above the level of the ankle mortise. Remainder the exam is unremarkable. IMPRESSION: Minimally displaced oblique fracture of the distal fibula. Electronically Signed   By: Marin Olp M.D.   On: 03/22/2019 08:12   CT Head Wo Contrast  Result Date: 03/23/2019 CLINICAL DATA:  Multiple falls since yesterday. Head trauma. Headache. EXAM: CT HEAD WITHOUT CONTRAST CT CERVICAL SPINE WITHOUT CONTRAST TECHNIQUE: Multidetector CT imaging of the head and cervical spine was performed following the standard protocol without intravenous contrast. Multiplanar CT image reconstructions of the cervical spine were also generated. COMPARISON:  CT head without contrast 03/22/19. FINDINGS: CT HEAD FINDINGS Brain: Moderate atrophy and white matter disease is again seen. No acute infarct, hemorrhage, or mass lesion is present. The ventricles are proportionate to the degree of atrophy. No significant extraaxial fluid collection is present. The brainstem and cerebellum are within normal limits. Vascular: Atherosclerotic changes are present within the cavernous internal carotid arteries bilaterally. Calcifications are also present at the dural margin of the vertebral arteries. There is no significant change. No hyperdense vessel present. Skull: Calvarium is intact. No focal lytic or blastic lesions are present. No significant extracranial soft tissue lesion is present. Sinuses/Orbits: Right maxillary sinus is shrunken compatible with history of disease. No active disease is present. Paranasal sinuses and mastoid air cells are clear. The globes and orbits are within normal limits. IMPRESSION: 1. No acute trauma to the head or cervical spine. 2. Stable atrophy and white matter disease. This likely reflects the sequela of chronic  microvascular ischemia. 3. Atherosclerosis. Electronically Signed   By: San Morelle M.D.   On: 03/23/2019 04:59   CT Head Wo Contrast  Result Date: 03/22/2019 CLINICAL DATA:  Recent fall EXAM: CT HEAD WITHOUT CONTRAST TECHNIQUE: Contiguous axial images were obtained from the base of the skull through the vertex without intravenous contrast. COMPARISON:  None. FINDINGS: Brain: There is moderate diffuse atrophy. There is no intracranial mass, hemorrhage, extra-axial fluid collection, or midline shift. There is small vessel disease throughout the centra semiovale bilaterally. No acute infarct is demonstrable on this study. Vascular: There is no hyperdense vessel. There is calcification in each distal vertebral artery and in the carotid siphon regions bilaterally. Skull: The bony calvarium appears intact. Sinuses/Orbits: There is mucosal thickening in multiple ethmoid air cells. Orbits appear symmetric bilaterally. Other: Mastoid air cells are clear. IMPRESSION: Atrophy with periventricular small vessel disease. No acute infarct. No mass or hemorrhage. There are multiple foci of arterial vascular calcification. There is mucosal thickening in multiple ethmoid air cells. Electronically Signed   By: Lowella Grip III M.D.   On: 03/22/2019 08:20   CT Cervical Spine Wo Contrast  Result Date: 03/23/2019 CLINICAL DATA:  Pain. Multiple falls since yesterday. EXAM: CT CERVICAL SPINE WITHOUT CONTRAST TECHNIQUE: Multidetector CT imaging of the cervical spine was performed without intravenous contrast. Multiplanar CT image reconstructions were also generated. COMPARISON:  None. FINDINGS: Alignment: Grade 1 degenerative anterolisthesis at C4-5 measures 4 mm. No other significant listhesis is present. There is straightening of the normal cervical lordosis. Skull base and vertebrae: Degenerative changes are present at C1-2. Craniocervical junction is unremarkable. The vertebral body heights are maintained. No  acute or healing fractures are present. Soft tissues and spinal canal: No prevertebral fluid or swelling. No visible  canal hematoma. Disc levels: Severe right foraminal narrowing is present at C5-6 and C6-7 secondary to uncovertebral and facet disease. Left foraminal stenosis is worse at C5-6 and C6-7. Moderate facet hypertrophy is present at C3-4 and C4-5. Upper chest: The lung apices are clear. Thoracic inlet is within normal limits. IMPRESSION: 1. No acute fracture or traumatic subluxation. 2. Multilevel degenerative changes of the cervical spine as described. Electronically Signed   By: San Morelle M.D.   On: 03/23/2019 05:02   CT Thoracic Spine Wo Contrast  Result Date: 03/23/2019 CLINICAL DATA:  Multiple falls since yesterday. Back trauma. Pain. EXAM: CT THORACIC SPINE WITHOUT CONTRAST TECHNIQUE: Multidetector CT images of the thoracic were obtained using the standard protocol without intravenous contrast. COMPARISON:  Two-view chest x-ray 03/19/2016 FINDINGS: Alignment: Exaggerated thoracic kyphosis is stable. Vertebrae: Multilevel endplate degenerative changes are present. Vertebral body heights are maintained. No acute or healing fractures are present. Paraspinal and other soft tissues: Visualized ribs are intact. Atherosclerotic calcifications are present in the thoracic aorta. Small hiatal hernia is present. Pacing wires are noted. Is some dependent atelectasis or scarring in the lungs. The lungs are otherwise clear. Disc levels: Facet spurring contributes to foraminal narrowing left greater than right at T11-12 and T10-11. Mild foraminal narrowing is present bilaterally at T2-3 and on the right at T3-4. IMPRESSION: 1. No acute or healing fractures. 2. Stable exaggerated thoracic kyphosis. 3. Multilevel degenerative changes without acute abnormality. 4. Atherosclerosis. Electronically Signed   By: San Morelle M.D.   On: 03/23/2019 05:07   CT Lumbar Spine Wo Contrast  Result Date:  03/23/2019 CLINICAL DATA:  Back trauma. Multiple falls since yesterday. Pain. EXAM: CT LUMBAR SPINE WITHOUT CONTRAST TECHNIQUE: Multidetector CT imaging of the lumbar spine was performed without intravenous contrast administration. Multiplanar CT image reconstructions were also generated. COMPARISON:  Lumbar spine radiographs 11/29/2007. CT of the abdomen and pelvis 01/02/2015 FINDINGS: Segmentation: 5 non rib-bearing lumbar type vertebral bodies are present. The lowest fully formed vertebral body is L5. Alignment: No significant listhesis is present. Levoconvex curvature is centered at L3-4 Vertebrae: Vertebral body heights are maintained. No acute or healing fractures are present. Visualized sacrum is intact. Paraspinal and other soft tissues: Atherosclerotic changes are noted in the aorta and branch vessels without aneurysm. Left lower pole kidney stoner is increased in size now measuring over 2 cm. Disc levels: Broad-based disc protrusions and endplate spurring contribute to moderate central and bilateral foraminal stenosis at L1-2 greater than L2-3. Moderate to severe central canal stenosis is present at L4-5 greater than L3-4. Foraminal narrowing is greatest the right at L3-4. Moderate to severe central and bilateral foraminal stenosis at L5-S1 is worse on the left. IMPRESSION: 1. No acute fracture or traumatic subluxation. 2. Multilevel spondylosis of the lumbar spine as described. Severe central canal stenosis is greatest at L4-5 and L3-4. 3. Increased size of nonobstructing left lower pole renal stone. Electronically Signed   By: San Morelle M.D.   On: 03/23/2019 05:12    Procedures Procedures (including critical care time)  Medications Ordered in ED Medications - No data to display  ED Course  I have reviewed the triage vital signs and the nursing notes.  Pertinent labs & imaging results that were available during my care of the patient were reviewed by me and considered in my medical  decision making (see chart for details).    MDM Rules/Calculators/A&P  Patient presents to the emergency department status post fall and concerns for need for placement.  [name] nontoxic-appearing, resting comfortably, vitals notable for hypertension, doubt HTN emergency.  Imaging obtained in areas of concern without evidence of acute injury.  She had a recent ER visit yesterday where she was found to have a fibular fracture, Cam walker in place, removed and assessed, neurovascularly intact distally.  Family has concern for patient safety, she lives at home by herself and has been unable to bear weight or ambulate throughout her home.  They are requesting placement.  Will placed consult to transition of care team (case management/CSW).   Patient to be observed in the ED pending CSW/CM evaluation, care transitioned to AM ED team.   Findings and plan of care discussed with supervising physician Dr. Florina Ou who has evaluated the patient & is in agreement.   Final Clinical Impression(s) / ED Diagnoses Final diagnoses:  Fall, initial encounter    Rx / DC Orders ED Discharge Orders    None       Amaryllis Dyke, PA-C 03/23/19 0614    Shanon Rosser, MD 03/23/19 [phone]

## 2019-03-23 NOTE — Progress Notes (Signed)
Called PT office to see if someone could come to see patient [phone]. Per Network engineer she would page out to notify [facility] PT to see patient.

## 2019-03-23 NOTE — NC FL2 (Signed)
[place] LEVEL OF CARE SCREENING TOOL     IDENTIFICATION  Patient Name: Cheryl Zamora Birthdate: [birth year] Sex: female Admission Date (Current Location): 03/23/2019  [facility] and [place] Number:  Herbalist and Address:  [facility],  [place] [address], [place]      Provider Number: [id number]  Attending Physician Name and Address:  Default, Provider, MD  Relative Name and Phone Number:  Rudi Heap (nephew) [phone]    Current Level of Care: Hospital Recommended Level of Care: [place] Prior Approval Number:    Date Approved/Denied:   PASRR Number: [id number]  Discharge Plan: SNF    Current Diagnoses: Patient Active Problem List   Diagnosis Date Noted  . Palliative care by specialist   . DNR (do not resuscitate) discussion   . Persistent atrial fibrillation ([place])   . CHF (congestive heart failure) ([place]) 11/09/2016  . DVT (deep venous thrombosis) ([place]) 01/13/2015  . Cellulitis of both lower extremities 01/13/2015  . Cellulitis 01/13/2015  . Acute deep vein thrombosis (DVT) of popliteal vein of left lower extremity ([place])   . Hematuria   . Acute on chronic renal insufficiency 01/06/2015  . Atrial fibrillation with RVR ([place])   . Sepsis ([place]) 01/02/2015  . UTI (lower urinary tract infection) 01/02/2015  . Paroxysmal atrial fibrillation ([facility])   . Chronic diastolic (congestive) heart failure ([place])   . Atrial fibrillation with rapid ventricular response ([place])   . Acute on chronic combined systolic and diastolic congestive heart failure ([place]) 04/12/2014  . CKD (chronic kidney disease) stage 3, GFR 30-59 ml/min 04/11/2014  . CAD, multiple vessel, hx CABG 2012 04/11/2014  . Long term current use of anticoagulant therapy 12/21/2012  . Physical deconditioning 12/17/2012  . PAF (paroxysmal atrial fibrillation) ([place]) 12/12/2012  . Chronic diastolic CHF (congestive heart failure), NYHA class 3 ([place]) 12/12/2012   . Essential hypertension   . Neuropathy     Orientation RESPIRATION BLADDER Height & Weight     Self, Situation, Place(Flucuates due to dementia)  Normal Continent Weight:   Height:     BEHAVIORAL SYMPTOMS/MOOD NEUROLOGICAL BOWEL NUTRITION STATUS      Continent Diet(regular)  AMBULATORY STATUS COMMUNICATION OF NEEDS Skin   Extensive Assist Verbally Normal                       Personal Care Assistance Level of Assistance  Bathing, Feeding, Dressing Bathing Assistance: Maximum assistance Feeding assistance: Limited assistance Dressing Assistance: Maximum assistance     Functional Limitations Info  Sight, Hearing, Speech Sight Info: Adequate Hearing Info: Adequate Speech Info: Adequate    SPECIAL CARE FACTORS FREQUENCY  PT (By licensed PT), OT (By licensed OT)     PT Frequency: 5x weekly OT Frequency: 5x weekly            Contractures Contractures Info: Not present    Additional Factors Info  Code Status, Allergies Code Status Info: Full Allergies Info: Digoxin And Related, Amiodarone, Codeine, Penicillins           Current Medications (03/23/2019):  This is the current hospital active medication list No current facility-administered medications for this encounter.   Current Outpatient Medications  Medication Sig Dispense Refill  . acetaminophen (TYLENOL) 325 MG tablet Take 650 mg by mouth every 6 (six) hours as needed for mild pain.    . Cholecalciferol (VITAMIN D3) 1000 units CAPS Take 1,000 Units by mouth daily.    Marland Kitchen doxycycline (  VIBRA-TABS) 100 MG tablet Take 1 tablet (100 mg total) by mouth 2 (two) times daily. 20 tablet 0  . furosemide (LASIX) 40 MG tablet Take 1 tablet (40 mg total) by mouth daily. NEED OV. (Patient taking differently: Take 40 mg by mouth daily. ) 30 tablet 0  . memantine (NAMENDA) 10 MG tablet Take 10 mg by mouth daily.     . potassium chloride SA (K-DUR,KLOR-CON) 20 MEQ tablet TAKE 1 TABLET(20 MEQ) BY MOUTH DAILY (Patient  taking differently: Take 20 mEq by mouth daily. TAKE 1 TABLET(20 MEQ) BY MOUTH DAILY) 7 tablet 0  . XARELTO 15 MG TABS tablet Take 15 mg by mouth daily at 2 PM.   5     Discharge Medications: Please see discharge summary for a list of discharge medications.  Relevant Imaging Results:  Relevant Lab Results:   Additional Information SS#: [id number]  Janace Hoard, LCSW

## 2019-03-23 NOTE — ED Notes (Signed)
Full rainbow sent to lab.  

## 2019-03-23 NOTE — ED Triage Notes (Signed)
PER EMS: Pt is coming from home with c/o fall. Pt has had multiple falls since she was seen yesterday for her fall that resulted in a left heel fracture. Patients nephew is her POA and is trying to get placement in a rehab facility. Nephew reports she has a hx dementia.   EMS VITALS: BP 164/78 HR96 SPO2 97% TEMP 98

## 2019-03-23 NOTE — Progress Notes (Addendum)
Received Cheryl Zamora this PM in her room awake in bed. Later she was assisted with toileting and made comfortable for bed. She slept throughout the night and woke up at approximately at 05000 hrs with the sitter at the bedside. She remained cognitively disorganized and decrease memory recall.

## 2019-03-23 NOTE — Progress Notes (Signed)
CSW received a call from pt's RN requesting an update on placement.  Per chart review, pt has been accepted for [facility] by [facility].  CSW will continue to follow for D/C needs.  Alphonse Guild. Zayvian Mcmurtry, LCSW, LCAS, CSI Transitions of Care Clinical Social Worker Care Coordination Department Ph: [phone]

## 2019-03-23 NOTE — ED Notes (Signed)
Pt transported to CT ?

## 2019-03-23 NOTE — Evaluation (Signed)
Physical Therapy Evaluation Patient Details Name: Cheryl Zamora MRN: [id number] DOB: 09/11/1923 Today's Date: 03/23/2019   History of Present Illness  Pt is a 86 year old female with history of PAfib with DCCV, CAD, CABG, HTN.  Pt seen yesterday s/p fall & found to have a minimally displaced oblique fracture of the distal fibula - per orthopedics could be weightbearing as tolerated in CAM walker, social work was consulted--> plan was discharge home with with home health RN & aide, this is scheduled for today 03/23/19. Per patient's nephew she is unable to bearweight with her walker, he has had to pick her up and carry her places.  Pt returned to ED due to inability to care for herself at home and has had 4 falls  Clinical Impression  Pt admitted with above diagnosis.  Pt currently with functional limitations due to the deficits listed below (see PT Problem List). Pt will benefit from skilled PT to increase their independence and safety with mobility to allow discharge to the venue listed below.   Pt assisted with bed mobility and standing and currently requiring max assist for mobility.  Pt has fallen 4x at home per nephew and lives alone.  Pt would benefit from SNF for rehab prior to return home to improve balance, safety, and mobility.     Follow Up Recommendations SNF;Supervision/Assistance - 24 hour    Equipment Recommendations  None recommended by PT    Recommendations for Other Services       Precautions / Restrictions Precautions Precautions: Fall Required Braces or Orthoses: Other Brace Restrictions Other Position/Activity Restrictions: WBAT with CAM walker      Mobility  Bed Mobility Overal bed mobility: Needs Assistance Bed Mobility: Supine to Sit;Sit to Supine     Supine to sit: Max assist Sit to supine: Max assist   General bed mobility comments: verbal cues for technique, required assist due to pain and weakness  Transfers Overall transfer level: Needs  assistance Equipment used: Rolling walker (2 wheeled) Transfers: Sit to/from Stand Sit to Stand: Mod assist;From elevated surface         General transfer comment: increased assist to rise and steady, cues for weight shifting and using UEs through RW for pain control and steadying  Ambulation/Gait                Stairs            Wheelchair Mobility    Modified Rankin (Stroke Patients Only)       Balance Overall balance assessment: Needs assistance;History of Falls         Standing balance support: Bilateral upper extremity supported Standing balance-Leahy Scale: Zero                               Pertinent Vitals/Pain Pain Assessment: Faces Faces Pain Scale: Hurts whole lot Pain Location: R lower leg with mobilizing Pain Descriptors / Indicators: Grimacing;Discomfort Pain Intervention(s): Repositioned;Monitored during session    Charlo expects to be discharged to:: Private residence Living Arrangements: Alone Available Help at Discharge: Family;Available PRN/intermittently Type of Home: House Home Access: Stairs to enter Entrance Stairs-Rails: Left Entrance Stairs-Number of Steps: 2   Home Equipment: Walker - 2 wheels Additional Comments: nephew assists with transport and meals    Prior Function Level of Independence: Independent with assistive device(s)         Comments: after fall, pt has not been able to  function well at home     Hand Dominance        Extremity/Trunk Assessment        Lower Extremity Assessment Lower Extremity Assessment: Generalized weakness;RLE deficits/detail RLE Deficits / Details: CAM walker on Right, heavy for pt       Communication   Communication: HOH  Cognition Arousal/Alertness: Awake/alert Behavior During Therapy: WFL for tasks assessed/performed Overall Cognitive Status: Within Functional Limits for tasks assessed                                  General Comments: however very HOH      General Comments      Exercises     Assessment/Plan    PT Assessment Patient needs continued PT services  PT Problem List Decreased strength;Decreased mobility;Decreased activity tolerance;Decreased balance;Decreased knowledge of use of DME;Decreased knowledge of precautions;Pain       PT Treatment Interventions DME instruction;Therapeutic activities;Gait training;Therapeutic exercise;Patient/family education;Functional mobility training;Balance training;Stair training;Wheelchair mobility training    PT Goals (Current goals can be found in the Care Plan section)  Acute Rehab PT Goals PT Goal Formulation: With patient/family Time For Goal Achievement: 03/30/19 Potential to Achieve Goals: Good    Frequency Min 2X/week   Barriers to discharge        Co-evaluation               AM-PAC PT "6 Clicks" Mobility  Outcome Measure Help needed turning from your back to your side while in a flat bed without using bedrails?: A Lot Help needed moving from lying on your back to sitting on the side of a flat bed without using bedrails?: A Lot Help needed moving to and from a bed to a chair (including a wheelchair)?: A Lot Help needed standing up from a chair using your arms (e.g., wheelchair or bedside chair)?: A Lot Help needed to walk in hospital room?: Total Help needed climbing 3-5 steps with a railing? : Total 6 Click Score: 10    End of Session Equipment Utilized During Treatment: Gait belt Activity Tolerance: Patient tolerated treatment well Patient left: in bed;with bed alarm set;with call bell/phone within reach Nurse Communication: Mobility status PT Visit Diagnosis: Other abnormalities of gait and mobility (R26.89);Pain Pain - Right/Left: Right Pain - part of body: Leg    Time: 1343-1405 PT Time Calculation (min) (ACUTE ONLY): 22 min   Charges:   PT Evaluation $PT Eval Low Complexity: 1 Low         Kati PT,  DPT Acute Rehabilitation Services Office: [phone]  Trena Platt 03/23/2019, 3:17 PM

## 2019-03-23 NOTE — TOC Initial Note (Signed)
Transition of Care [facility]) - Initial/Assessment Note    Patient Details  Name: Cheryl Zamora MRN: [id number] Date of Birth: Jun 22, 1923  Transition of Care [facility]) CM/SW Contact:    Janace Hoard, LCSW Phone Number: 03/23/2019, 8:30 AM  Clinical Narrative:                 CSW spoke with patient and patient's nephew at bedside regarding SNF placement. Patient's nephew reports patient fell last night after returning home. Patient's nephew felt patient needed more care to be able to gain more strength to return home. Patient's nephew reports patient has been to [facility] before, but did not like the care there and does not want to return. Patient requested to go to [facility] but CSW informed them that they are currently not accepting new admissions due to [place]. CSW informed them of the Medicare.gov rating process and will go over that further once patient starts getting bed offers. CSW will continue to follow up.   Expected Discharge Plan: Skilled Nursing Facility Barriers to Discharge: No Barriers Identified   Patient Goals and CMS Choice Patient states their goals for this hospitalization and ongoing recovery are:: Per nephew, for patient to gain more strength CMS Medicare.gov Compare Post Acute Care list provided to:: Other (Comment Required)(Nephew) Choice offered to / list presented to : [facility] POA / Guardian(Nephew)  Expected Discharge Plan and Services Expected Discharge Plan: [place] In-house Referral: Clinical Social Work Discharge Planning Services: CM Consult Post Acute Care Choice: [place] arrangements for the past 2 months: [place]                                      Prior Living Arrangements/Services Living arrangements for the past 2 months: Single Family Home Lives with:: Self Patient language and need for interpreter reviewed:: Yes Do you feel safe going back to the place where you live?: Yes      Need  for Family Participation in Patient Care: Yes (Comment) Care giver support system in place?: Yes (comment) Current home services: DME Criminal Activity/Legal Involvement Pertinent to Current Situation/Hospitalization: No - Comment as needed  Activities of Daily Living      Permission Sought/Granted Permission sought to share information with : Facility Sport and exercise psychologist, Family Supports Permission granted to share information with : Yes, Verbal Permission Granted  Share Information with NAME: Rudi Heap  Permission granted to share info w AGENCY: SNFs  Permission granted to share info w Relationship: nephew  Permission granted to share info w Contact Information: [phone]  Emotional Assessment Appearance:: Appears stated age Attitude/Demeanor/Rapport: Engaged Affect (typically observed): Accepting Orientation: : Fluctuating Orientation (Suspected and/or reported Sundowners) Alcohol / Substance Use: Not Applicable Psych Involvement: No (comment)  Admission diagnosis:  Fall Patient Active Problem List   Diagnosis Date Noted  . Palliative care by specialist   . DNR (do not resuscitate) discussion   . Persistent atrial fibrillation ([place])   . CHF (congestive heart failure) ([place]) 11/09/2016  . DVT (deep venous thrombosis) ([place]) 01/13/2015  . Cellulitis of both lower extremities 01/13/2015  . Cellulitis 01/13/2015  . Acute deep vein thrombosis (DVT) of popliteal vein of left lower extremity ([place])   . Hematuria   . Acute on chronic renal insufficiency 01/06/2015  . Atrial fibrillation with RVR ([place])   . Sepsis ([place]) 01/02/2015  . UTI (lower urinary tract infection) 01/02/2015  .  Paroxysmal atrial fibrillation ([facility])   . Chronic diastolic (congestive) heart failure ([place])   . Atrial fibrillation with rapid ventricular response ([place])   . Acute on chronic combined systolic and diastolic congestive heart failure ([place]) 04/12/2014  . CKD (chronic kidney disease) stage 3, GFR  30-59 ml/min 04/11/2014  . CAD, multiple vessel, hx CABG 2012 04/11/2014  . Long term current use of anticoagulant therapy 12/21/2012  . Physical deconditioning 12/17/2012  . PAF (paroxysmal atrial fibrillation) ([place]) 12/12/2012  . Chronic diastolic CHF (congestive heart failure), NYHA class 3 ([place]) 12/12/2012  . Essential hypertension   . Neuropathy    PCP:  Burnard Bunting, MD Pharmacy:   [facility] [place], [place] - [address] AT [place] [address] [place] [address] Phone: [phone] Fax: [place], [address] AT [facility] [address] [place] [address] Phone: [phone] Fax: [phone]     Social Determinants of Health (SDOH) Interventions    Readmission Risk Interventions No flowsheet data found.

## 2019-03-24 ENCOUNTER — Emergency Department (HOSPITAL_COMMUNITY): Payer: Medicare Other

## 2019-03-24 LAB — URINALYSIS, ROUTINE W REFLEX MICROSCOPIC
Bilirubin Urine: NEGATIVE
Glucose, UA: NEGATIVE mg/dL
Ketones, ur: NEGATIVE mg/dL
Nitrite: NEGATIVE
Protein, ur: NEGATIVE mg/dL
RBC / HPF: >50 RBC/hpf — ABNORMAL HIGH (ref 0–5)
Specific Gravity, Urine: 1.012 (ref 1.005–1.030)
pH: 5 (ref 5.0–8.0)

## 2019-03-24 LAB — BASIC METABOLIC PANEL
Anion gap: 11 (ref 5–15)
BUN: 17 mg/dL (ref 8–23)
CO2: 26 mmol/L (ref 22–32)
Calcium: 9.5 mg/dL (ref 8.9–10.3)
Chloride: 101 mmol/L (ref 98–111)
Creatinine, Ser: 1.02 mg/dL — ABNORMAL HIGH (ref 0.44–1.00)
GFR calc Af Amer: 55 mL/min — ABNORMAL LOW (ref >60)
GFR calc non Af Amer: 47 mL/min — ABNORMAL LOW (ref >60)
Glucose, Bld: 109 mg/dL — ABNORMAL HIGH (ref 70–99)
Potassium: 3.4 mmol/L — ABNORMAL LOW (ref 3.5–5.1)
Sodium: 138 mmol/L (ref 135–145)

## 2019-03-24 LAB — CBC
HCT: 44.3 % (ref 36.0–46.0)
Hemoglobin: 14.1 g/dL (ref 12.0–15.0)
MCH: 27.5 pg (ref 26.0–34.0)
MCHC: 31.8 g/dL (ref 30.0–36.0)
MCV: 86.4 fL (ref 80.0–100.0)
Platelets: 242 10*3/uL (ref 150–400)
RBC: 5.13 MIL/uL — ABNORMAL HIGH (ref 3.87–5.11)
RDW: 14 % (ref 11.5–15.5)
WBC: 11.6 10*3/uL — ABNORMAL HIGH (ref 4.0–10.5)
nRBC: 0 % (ref 0.0–0.2)

## 2019-03-24 MED ORDER — RIVAROXABAN 15 MG PO TABS
15.0000 mg | ORAL_TABLET | Freq: Every day | ORAL | Status: DC
  Administered 2019-03-24 – 2019-03-25 (×2): 15 mg via ORAL
  Filled 2019-03-24 (×3): qty 1

## 2019-03-24 MED ORDER — MEMANTINE HCL 10 MG PO TABS
10.0000 mg | ORAL_TABLET | Freq: Every day | ORAL | Status: DC
  Administered 2019-03-24 – 2019-03-29 (×6): 10 mg via ORAL
  Filled 2019-03-24 (×6): qty 1

## 2019-03-24 MED ORDER — POTASSIUM CHLORIDE CRYS ER 20 MEQ PO TBCR
20.0000 meq | EXTENDED_RELEASE_TABLET | Freq: Every day | ORAL | Status: DC
  Administered 2019-03-24 – 2019-03-27 (×4): 20 meq via ORAL
  Filled 2019-03-24 (×4): qty 1

## 2019-03-24 MED ORDER — ACETAMINOPHEN 325 MG PO TABS
650.0000 mg | ORAL_TABLET | Freq: Four times a day (QID) | ORAL | Status: DC | PRN
  Filled 2019-03-24: qty 2

## 2019-03-24 MED ORDER — VITAMIN D3 25 MCG (1000 UNIT) PO TABS
1000.0000 [IU] | ORAL_TABLET | Freq: Every day | ORAL | Status: DC
  Administered 2019-03-24 – 2019-03-29 (×6): 1000 [IU] via ORAL
  Filled 2019-03-24 (×6): qty 1

## 2019-03-24 MED ORDER — FUROSEMIDE 40 MG PO TABS
40.0000 mg | ORAL_TABLET | Freq: Every day | ORAL | Status: DC
  Administered 2019-03-24 – 2019-03-27 (×4): 40 mg via ORAL
  Filled 2019-03-24 (×4): qty 1

## 2019-03-24 MED ORDER — CEPHALEXIN 250 MG PO CAPS
250.0000 mg | ORAL_CAPSULE | Freq: Three times a day (TID) | ORAL | Status: AC
  Administered 2019-03-24 – 2019-03-25 (×5): 250 mg via ORAL
  Filled 2019-03-24 (×5): qty 1

## 2019-03-24 NOTE — ED Provider Notes (Addendum)
ED awaiting nursing home placement.  According to the notes she has been accepted.  Home meds have been ordered.  [name] alert and awake this morning.  We will continue to monitor until nursing home placement arranged.   Dorie Rank, MD 03/24/19 [phone]  Labs reviewed.  Possible uti.  Will start oral abx.  Pt was assessed by social work.  Placement not available until Monday.    Dorie Rank, MD 03/24/19 1346

## 2019-03-24 NOTE — ED Notes (Signed)
In and Out completed by Quincess NT for urine collection. Pt being transported to x-ray for chest x-ray.

## 2019-03-24 NOTE — TOC Progression Note (Signed)
Transition of Care [facility]) - Progression Note    Patient Details  Name: Cheryl Zamora MRN: [id number] Date of Birth: April 14, 1923  Transition of Care [facility]) CM/SW [place], LCSW Phone Number: 03/24/2019, 1:07 PM  Clinical Narrative:   CSW notes that patient has been accepted in the [place] by [facility] and [facility]. CSW in communication with Freda Munro in admission from  health care regarding a possible weekend admission. Freda Munro explained that she would not be able to admit patient until Monday.   CSW also attempted to contact staff at [facility] care regarding patient discharge. CSW learned from receptionist that there was nobody to assist with discharge until Monday as all admin staff are out over the weekend.   [place] Transitions of Care  Clinical Social Worker  Ph: [phone]     Expected Discharge Plan: [place] Barriers to Discharge: No Barriers Identified  Expected Discharge Plan and Services Expected Discharge Plan: [place] In-house Referral: Clinical Social Work Discharge Planning Services: CM Consult Post Acute Care Choice: [place] arrangements for the past 2 months: Single Family Home                                       Social Determinants of Health (SDOH) Interventions    Readmission Risk Interventions No flowsheet data found.

## 2019-03-24 NOTE — ED Notes (Signed)
Pt's nephew said that pt is not at her baseline mentally.  He said that she has had 3 UTIs in the past 7 weeks and that she is "like this"  When she gets a UTI. Reports that she responds very quickly to antibiotics.  Informed Dr. Tomi Bamberger that we need a UA order and are trying to get a sample

## 2019-03-24 NOTE — ED Notes (Signed)
Pt alert and feeding self. Alert to self and place.  Calm and cooperative.

## 2019-03-24 NOTE — ED Notes (Signed)
Pt has been calm and cooperative. Her nephew has been supportive and helpful.

## 2019-03-25 LAB — URINE CULTURE: Culture: NO GROWTH

## 2019-03-25 MED ORDER — METOPROLOL TARTRATE 25 MG PO TABS
12.5000 mg | ORAL_TABLET | Freq: Two times a day (BID) | ORAL | Status: DC
  Administered 2019-03-25 – 2019-03-29 (×9): 12.5 mg via ORAL
  Filled 2019-03-25 (×9): qty 1

## 2019-03-25 NOTE — Progress Notes (Signed)
03/25/2019  1644 Notified MD [place] that patient's urine appear to have blood in it. Will continue to monitor. Waiting for orders.

## 2019-03-25 NOTE — ED Provider Notes (Signed)
Patient in observation for frequent falls and fibular fracture.  Social work note looks like she is anticipated to be admitted tomorrow.  Patient in no distress.  Continue to monitor.  Patients ED nurse reached out and asked if we could start her on some blood pressure medicine because its been consistently elevated.  I do not see that she is actually on any blood pressure medicine but was back in 2019.  She is on metoprolol XL at that time.  We will start her on 12.5 of metoprolol twice daily.  Have them continue to monitor.   Hayden Rasmussen, MD 03/25/19 [phone]

## 2019-03-25 NOTE — ED Notes (Signed)
Pt.s breakfast has arrived. Pt. Stated that they did not feel like eating yet, placed food on table and told them that this writer would be back in 15 minutes to see if she was hungry then.

## 2019-03-25 NOTE — Progress Notes (Signed)
CSW reviewed current bed offers for patient. Patient has bed offers at [facility]. Per note from weekend CSW, [facility] will [facility] patient until tomorrow. CSW attempted to call admissions person with [facility], but did not receive an answer. CSW will continue to follow up with SNF bed offers.   CSW contacted patient's nephew to discuss current bed offers. He reports he was not pleased with the Medicare.gov star rating for [facility] and says [facility] is too far. CSW will reach out to some of the facilties who have not given a bed offer, but patient will likely be discharged on Monday.  [place], LCSW Transitions of Care Department [facility] ED [phone]

## 2019-03-25 NOTE — Progress Notes (Addendum)
Received Cheryl Zamora at the change of shift awake in her bed. She is pleasant and her memory is a little better this PM compared to last PM. She was compliant with her medications. She slept throughout to 0200hrs.

## 2019-03-26 ENCOUNTER — Inpatient Hospital Stay (HOSPITAL_COMMUNITY): Payer: Medicare Other

## 2019-03-26 ENCOUNTER — Encounter (HOSPITAL_COMMUNITY): Payer: Self-pay | Admitting: Radiology

## 2019-03-26 ENCOUNTER — Emergency Department (HOSPITAL_COMMUNITY): Payer: Medicare Other

## 2019-03-26 DIAGNOSIS — Z7901 Long term (current) use of anticoagulants: Secondary | ICD-10-CM | POA: Diagnosis not present

## 2019-03-26 DIAGNOSIS — N132 Hydronephrosis with renal and ureteral calculous obstruction: Secondary | ICD-10-CM | POA: Diagnosis present

## 2019-03-26 DIAGNOSIS — I5032 Chronic diastolic (congestive) heart failure: Secondary | ICD-10-CM | POA: Diagnosis not present

## 2019-03-26 DIAGNOSIS — N1330 Unspecified hydronephrosis: Secondary | ICD-10-CM | POA: Diagnosis present

## 2019-03-26 DIAGNOSIS — Z880 Allergy status to penicillin: Secondary | ICD-10-CM | POA: Diagnosis not present

## 2019-03-26 DIAGNOSIS — R911 Solitary pulmonary nodule: Secondary | ICD-10-CM | POA: Diagnosis present

## 2019-03-26 DIAGNOSIS — N200 Calculus of kidney: Secondary | ICD-10-CM

## 2019-03-26 DIAGNOSIS — R296 Repeated falls: Secondary | ICD-10-CM | POA: Diagnosis present

## 2019-03-26 DIAGNOSIS — G629 Polyneuropathy, unspecified: Secondary | ICD-10-CM | POA: Diagnosis present

## 2019-03-26 DIAGNOSIS — W19XXXA Unspecified fall, initial encounter: Secondary | ICD-10-CM | POA: Diagnosis not present

## 2019-03-26 DIAGNOSIS — I4821 Permanent atrial fibrillation: Secondary | ICD-10-CM | POA: Diagnosis present

## 2019-03-26 DIAGNOSIS — W19XXXD Unspecified fall, subsequent encounter: Secondary | ICD-10-CM | POA: Diagnosis present

## 2019-03-26 DIAGNOSIS — C342 Malignant neoplasm of middle lobe, bronchus or lung: Secondary | ICD-10-CM | POA: Diagnosis present

## 2019-03-26 DIAGNOSIS — I5042 Chronic combined systolic (congestive) and diastolic (congestive) heart failure: Secondary | ICD-10-CM | POA: Diagnosis present

## 2019-03-26 DIAGNOSIS — Z885 Allergy status to narcotic agent status: Secondary | ICD-10-CM | POA: Diagnosis not present

## 2019-03-26 DIAGNOSIS — R310 Gross hematuria: Secondary | ICD-10-CM | POA: Diagnosis not present

## 2019-03-26 DIAGNOSIS — I10 Essential (primary) hypertension: Secondary | ICD-10-CM | POA: Diagnosis not present

## 2019-03-26 DIAGNOSIS — Z20822 Contact with and (suspected) exposure to covid-19: Secondary | ICD-10-CM | POA: Diagnosis present

## 2019-03-26 DIAGNOSIS — F0390 Unspecified dementia without behavioral disturbance: Secondary | ICD-10-CM | POA: Diagnosis present

## 2019-03-26 DIAGNOSIS — I130 Hypertensive heart and chronic kidney disease with heart failure and stage 1 through stage 4 chronic kidney disease, or unspecified chronic kidney disease: Secondary | ICD-10-CM | POA: Diagnosis present

## 2019-03-26 DIAGNOSIS — K219 Gastro-esophageal reflux disease without esophagitis: Secondary | ICD-10-CM | POA: Diagnosis present

## 2019-03-26 DIAGNOSIS — Z951 Presence of aortocoronary bypass graft: Secondary | ICD-10-CM | POA: Diagnosis not present

## 2019-03-26 DIAGNOSIS — E785 Hyperlipidemia, unspecified: Secondary | ICD-10-CM | POA: Diagnosis present

## 2019-03-26 DIAGNOSIS — N1832 Chronic kidney disease, stage 3b: Secondary | ICD-10-CM | POA: Diagnosis present

## 2019-03-26 DIAGNOSIS — Z8249 Family history of ischemic heart disease and other diseases of the circulatory system: Secondary | ICD-10-CM | POA: Diagnosis not present

## 2019-03-26 DIAGNOSIS — I495 Sick sinus syndrome: Secondary | ICD-10-CM | POA: Diagnosis present

## 2019-03-26 DIAGNOSIS — I2510 Atherosclerotic heart disease of native coronary artery without angina pectoris: Secondary | ICD-10-CM | POA: Diagnosis present

## 2019-03-26 DIAGNOSIS — Z888 Allergy status to other drugs, medicaments and biological substances status: Secondary | ICD-10-CM | POA: Diagnosis not present

## 2019-03-26 DIAGNOSIS — Z86718 Personal history of other venous thrombosis and embolism: Secondary | ICD-10-CM | POA: Diagnosis not present

## 2019-03-26 DIAGNOSIS — Z66 Do not resuscitate: Secondary | ICD-10-CM | POA: Diagnosis not present

## 2019-03-26 DIAGNOSIS — Z87891 Personal history of nicotine dependence: Secondary | ICD-10-CM | POA: Diagnosis not present

## 2019-03-26 DIAGNOSIS — R918 Other nonspecific abnormal finding of lung field: Secondary | ICD-10-CM | POA: Diagnosis present

## 2019-03-26 LAB — CBC
HCT: 50.3 % — ABNORMAL HIGH (ref 36.0–46.0)
Hemoglobin: 15.7 g/dL — ABNORMAL HIGH (ref 12.0–15.0)
MCH: 27.2 pg (ref 26.0–34.0)
MCHC: 31.2 g/dL (ref 30.0–36.0)
MCV: 87 fL (ref 80.0–100.0)
Platelets: 288 10*3/uL (ref 150–400)
RBC: 5.78 MIL/uL — ABNORMAL HIGH (ref 3.87–5.11)
RDW: 14.2 % (ref 11.5–15.5)
WBC: 15.3 10*3/uL — ABNORMAL HIGH (ref 4.0–10.5)
nRBC: 0 % (ref 0.0–0.2)

## 2019-03-26 LAB — TSH: TSH: 1.427 u[IU]/mL (ref 0.350–4.500)

## 2019-03-26 LAB — URINALYSIS, ROUTINE W REFLEX MICROSCOPIC
Bilirubin Urine: NEGATIVE
Glucose, UA: NEGATIVE mg/dL
Ketones, ur: NEGATIVE mg/dL
Leukocytes,Ua: NEGATIVE
Nitrite: NEGATIVE
Protein, ur: 100 mg/dL — AB
RBC / HPF: >50 RBC/hpf — ABNORMAL HIGH (ref 0–5)
Specific Gravity, Urine: 1.015 (ref 1.005–1.030)
WBC, UA: >50 WBC/hpf — ABNORMAL HIGH (ref 0–5)
pH: 6 (ref 5.0–8.0)

## 2019-03-26 LAB — COMPREHENSIVE METABOLIC PANEL
ALT: 13 U/L (ref 0–44)
AST: 20 U/L (ref 15–41)
Albumin: 4.1 g/dL (ref 3.5–5.0)
Alkaline Phosphatase: 58 U/L (ref 38–126)
Anion gap: 10 (ref 5–15)
BUN: 30 mg/dL — ABNORMAL HIGH (ref 8–23)
CO2: 30 mmol/L (ref 22–32)
Calcium: 10 mg/dL (ref 8.9–10.3)
Chloride: 99 mmol/L (ref 98–111)
Creatinine, Ser: 1.45 mg/dL — ABNORMAL HIGH (ref 0.44–1.00)
GFR calc Af Amer: 36 mL/min — ABNORMAL LOW (ref >60)
GFR calc non Af Amer: 31 mL/min — ABNORMAL LOW (ref >60)
Glucose, Bld: 112 mg/dL — ABNORMAL HIGH (ref 70–99)
Potassium: 4 mmol/L (ref 3.5–5.1)
Sodium: 139 mmol/L (ref 135–145)
Total Bilirubin: 1.1 mg/dL (ref 0.3–1.2)
Total Protein: 7.5 g/dL (ref 6.5–8.1)

## 2019-03-26 LAB — RESPIRATORY PANEL BY RT PCR (FLU A&B, COVID)
Influenza A by PCR: NEGATIVE
Influenza B by PCR: NEGATIVE
SARS Coronavirus 2 by RT PCR: NEGATIVE

## 2019-03-26 LAB — PROTIME-INR
INR: 1.9 — ABNORMAL HIGH (ref 0.8–1.2)
Prothrombin Time: 21.9 seconds — ABNORMAL HIGH (ref 11.4–15.2)

## 2019-03-26 LAB — MAGNESIUM: Magnesium: 2.3 mg/dL (ref 1.7–2.4)

## 2019-03-26 MED ORDER — POLYETHYLENE GLYCOL 3350 17 G PO PACK: 17.0000 g | PACK | Freq: Every day | ORAL | Status: DC | PRN

## 2019-03-26 MED ORDER — ONDANSETRON HCL 4 MG PO TABS: 4.0000 mg | ORAL_TABLET | Freq: Four times a day (QID) | ORAL | Status: DC | PRN

## 2019-03-26 MED ORDER — ONDANSETRON HCL 4 MG/2ML IJ SOLN: 4.0000 mg | Freq: Four times a day (QID) | INTRAMUSCULAR | Status: DC | PRN

## 2019-03-26 NOTE — Consult Note (Signed)
Subjective: CC: Gross hematuria.   I was asked to see Cheryl Zamora in consultation by Dr. Liane Comber for the finding of a 2.5 cm left staghorn stone with mild obstruction of the upper pole on a CT done for gross hematuria.  She was seen in the ED with ankle pain following a fall and was found to have a non-displaced fibular fracture. She is on Xarelto for her chronic afib.  She was noted to have gross hematuria and a CT was done.   She is HOH and has some dementia but does report some occasional left flank pain.  She has had some dysuria and reports she has had prior UTI's but no other GU history.  I spoke to her nephew who reports that she has been having some intermittent mild/mod pain for the last 2 months.   I have reviewed her current CT report and imaging and prior CT's including one from 2016 and discussed those with her nephew.  She had small bilateral renal stones about 14 years ago.  On the CT in 2016 when she was in with a cardiac issue, she had an 8.23mm stone in the left renal pelvis but doesn't appear to have had urologic f/u for that.  There was no obstruction at that time and now the obstruction is mild despite the presence of a large partial staghorn with extension to the UPJ.  She has a history of CKD but it has been stable.    Her UA has >50 WBC and >50 RBC's. A urine culture on 1/23 was negative and there is one pending from today.  She was started on Keflex on 1/23 pending the culture.   She has a long smoking history and appears to have a new lung CA on CT today.  ROS:  Review of Systems  Unable to perform ROS: Dementia    Allergies  Allergen Reactions  . Digoxin And Related Other (See Comments)    Increased HR  . Amiodarone Other (See Comments)    Excessive QT prolongation, nausea  . Codeine Nausea And Vomiting  . Penicillins Nausea Only    Did it involve swelling of the face/tongue/throat, SOB, or low BP? Unknown Did it involve sudden or severe rash/hives, skin peeling, or any  reaction on the inside of your mouth or nose? Unknown Did you need to seek medical attention at a hospital or doctor's office? Unknown When did it last happen? If all above answers are "NO", may proceed with cephalosporin use.    Past Medical History:  Diagnosis Date  . Atrial fibrillation with rapid ventricular response ([place]) 11/2012; 10/2013   a. s/p TEE/DCCV 11/2012 & 10/2013 b. amiodarone caused significant QT prlongation (>691msec)  . Chronic combined systolic and diastolic CHF (congestive heart failure) ([place])   . Coronary artery disease 2005   a. s/p CABG (2012)  . GERD (gastroesophageal reflux disease)   . Hyperlipidemia    pt denies this hx on 01/03/2015  . Hypertension   . Neuropathy   . Osteoarthritis of back     Past Surgical History:  Procedure Laterality Date  . ABDOMINAL HYSTERECTOMY    . AV NODE ABLATION N/A 11/16/2016   Procedure: AV Node Ablation;  Surgeon: Constance Haw, MD;  Location: [facility];  Service: Cardiovascular;  Laterality: N/A;  . BACK SURGERY    . CARDIAC CATHETERIZATION  04/12/2003   [place] 60, LAD 90, D1 30, CFX 95->T, RCA OK  . CARDIOVERSION N/A 12/13/2012   Procedure:  CARDIOVERSION;  Surgeon: Larey Dresser, MD;  Location: [facility];  Service: Cardiovascular;  Laterality: N/A;  . CARDIOVERSION N/A 11/13/2013   Procedure: CARDIOVERSION;  Surgeon: Terianna Spark, MD;  Location: [facility] ENDOSCOPY;  Service: Cardiovascular;  Laterality: N/A;  . CARDIOVERSION N/A 04/12/2014   Procedure: CARDIOVERSION;  Surgeon: Thayer Headings, MD;  Location: [facility] ENDOSCOPY;  Service: Cardiovascular;  Laterality: N/A;  . CARDIOVERSION N/A 01/06/2015   Procedure: CARDIOVERSION;  Surgeon: Larey Dresser, MD;  Location: [facility] [place];  Service: Cardiovascular;  Laterality: N/A;  . CARDIOVERSION N/A 11/11/2016   Procedure: CARDIOVERSION;  Surgeon: Josue Hector, MD;  Location: [facility] ENDOSCOPY;  Service: Cardiovascular;  Laterality: N/A;  . [place]  1960's   "ruptured disc'  . CORONARY ANGIOPLASTY    . CORONARY ARTERY BYPASS GRAFT  04/18/2003   x 4; LIMA-LAD, SVG-D1, SVG-OM1-OM2  . PACEMAKER IMPLANT N/A 11/16/2016   Procedure: Pacemaker Implant;  Surgeon: Constance Haw, MD;  Location: [place];  Service: Cardiovascular;  Laterality: N/A;  . TEE WITHOUT CARDIOVERSION N/A 12/13/2012   Procedure: TRANSESOPHAGEAL ECHOCARDIOGRAM (TEE);  Surgeon: Larey Dresser, MD;  Location: [facility] ENDOSCOPY;  Service: Cardiovascular;  Laterality: N/A;  . TEE WITHOUT CARDIOVERSION N/A 11/13/2013   Procedure: TRANSESOPHAGEAL ECHOCARDIOGRAM (TEE);  Surgeon: Berdena Spark, MD;  Location: [place];  Service: Cardiovascular;  Laterality: N/A;  . TEE WITHOUT CARDIOVERSION N/A 11/11/2016   Procedure: TRANSESOPHAGEAL ECHOCARDIOGRAM (TEE);  Surgeon: Josue Hector, MD;  Location: [facility] ENDOSCOPY;  Service: Cardiovascular;  Laterality: N/A;  . TONSILLECTOMY      Social History   Socioeconomic History  . Marital status: Single    Spouse name: Not on file  . Number of children: Not on file  . Years of education: Not on file  . Highest education level: Not on file  Occupational History  . Occupation: Retired  Tobacco Use  . Smoking status: Former Smoker    Packs/day: 1.00    Years: 60.00    Pack years: 60.00    Types: Cigarettes  . Smokeless tobacco: Never Used  Substance and Sexual Activity  . Alcohol use: Yes    Comment: 01/03/2015 "might have a beer a couple times/yr"  . Drug use: No  . Sexual activity: Never  Other Topics Concern  . Not on file  Social History Narrative   Pt son helps with her care.   Social Determinants of Health   Financial Resource Strain:   . Difficulty of Paying Living Expenses: Not on file  Food Insecurity:   . Worried About Charity fundraiser in the Last Year: Not on file  . Ran Out of Food in the Last Year: Not on file  Transportation Needs:   . Lack of Transportation (Medical): Not on file   . Lack of Transportation (Non-Medical): Not on file  Physical Activity:   . Days of Exercise per Week: Not on file  . Minutes of Exercise per Session: Not on file  Stress:   . Feeling of Stress : Not on file  Social Connections:   . Frequency of Communication with Friends and Family: Not on file  . Frequency of Social Gatherings with Friends and Family: Not on file  . Attends Religious Services: Not on file  . Active Member of Clubs or Organizations: Not on file  . Attends Archivist Meetings: Not on file  . Marital Status: Not on file  Intimate Partner Violence:   . Fear of  Current or Ex-Partner: Not on file  . Emotionally Abused: Not on file  . Physically Abused: Not on file  . Sexually Abused: Not on file    Family History  Problem Relation Age of Onset  . Heart attack Mother   . Heart attack Father   . Heart attack Brother   . Heart attack Brother   . Heart attack Brother   . Heart attack Brother     Anti-infectives: Anti-infectives (From admission, onward)   Start     Dose/Rate Route Frequency Ordered Stop   03/24/19 1515  cephALEXin (KEFLEX) capsule 250 mg     250 mg Oral Every 8 hours 03/24/19 1347 03/25/19 2130      Current Facility-Administered Medications  Medication Dose Route Frequency Provider Last Rate Last Admin  . acetaminophen (TYLENOL) tablet 650 mg  650 mg Oral Q6H PRN [place] Eric J, DO      . cholecalciferol (VITAMIN D) tablet 1,000 Units  1,000 Units Oral Daily [place] Eric J, DO   1,000 Units at 03/26/19 1025  . furosemide (LASIX) tablet 40 mg  40 mg Oral Daily [place] Eric J, DO   40 mg at 03/26/19 1025  . memantine (NAMENDA) tablet 10 mg  10 mg Oral Daily [place] Donnamarie Poag, DO   10 mg at 03/26/19 1025  . metoprolol tartrate (LOPRESSOR) tablet 12.5 mg  12.5 mg Oral BID [place] Eric J, DO   12.5 mg at 03/26/19 1024  . ondansetron (ZOFRAN) tablet 4 mg  4 mg Oral Q6H PRN [place] Donnamarie Poag, DO       Or  . ondansetron [facility]) injection 4  mg  4 mg Intravenous Q6H PRN [place] Eric J, DO      . polyethylene glycol (MIRALAX / GLYCOLAX) packet 17 g  17 g Oral Daily PRN [place] Eric J, DO      . potassium chloride SA (KLOR-CON) CR tablet 20 mEq  20 mEq Oral Daily [place] Donnamarie Poag, DO   20 mEq at 03/26/19 1024     Objective: Vital signs in last 24 hours: Temp:  [97.3 F (36.3 C)-98 F (36.7 C)] 97.3 F (36.3 C) (01/25 1545) Pulse Rate:  [68-79] 74 (01/25 1545) Resp:  [17-18] 17 (01/25 1545) BP: (117-185)/(65-99) 127/67 (01/25 1545) SpO2:  [93 %-99 %] 93 % (01/25 1545)  Intake/Output from previous day: 01/24 0701 - 01/25 0700 In: 600 [P.O.:600] Out: 450 [Urine:450] Intake/Output this shift: No intake/output data recorded.   Physical Exam Vitals reviewed.  Constitutional:      Appearance: Normal appearance.  HENT:     Head: Normocephalic and atraumatic.  Cardiovascular:     Rate and Rhythm: Normal rate. Rhythm irregular.  Pulmonary:     Effort: Pulmonary effort is normal. No respiratory distress.  Abdominal:     General: Abdomen is flat.     Palpations: Abdomen is soft.     Tenderness: There is abdominal tenderness (mild LUQ).  Musculoskeletal:        General: No swelling or tenderness. Normal range of motion.     Cervical back: Normal range of motion and neck supple.  Skin:    General: Skin is warm and dry.  Neurological:     General: No focal deficit present.     Mental Status: She is alert.  Psychiatric:        Mood and Affect: Mood normal.        Behavior: Behavior normal.     Lab Results:  Results for  orders placed or performed during the hospital encounter of 03/23/19 (from the past 24 hour(s))  Respiratory Panel by RT PCR (Flu A&B, Covid) - Nasopharyngeal Swab     Status: None   Collection Time: 03/26/19 10:17 AM   Specimen: Nasopharyngeal Swab  Result Value Ref Range   SARS Coronavirus 2 by RT PCR NEGATIVE NEGATIVE   Influenza A by PCR NEGATIVE NEGATIVE   Influenza B by PCR NEGATIVE  NEGATIVE  Urinalysis, Routine w reflex microscopic     Status: Abnormal   Collection Time: 03/26/19  3:03 PM  Result Value Ref Range   Color, Urine RED (A) YELLOW   APPearance CLOUDY (A) CLEAR   Specific Gravity, Urine 1.015 1.005 - 1.030   pH 6.0 5.0 - 8.0   Glucose, UA NEGATIVE NEGATIVE mg/dL   Hgb urine dipstick MODERATE (A) NEGATIVE   Bilirubin Urine NEGATIVE NEGATIVE   Ketones, ur NEGATIVE NEGATIVE mg/dL   Protein, ur 100 (A) NEGATIVE mg/dL   Nitrite NEGATIVE NEGATIVE   Leukocytes,Ua NEGATIVE NEGATIVE   RBC / HPF >50 (H) 0 - 5 RBC/hpf   WBC, UA >50 (H) 0 - 5 WBC/hpf   Bacteria, UA FEW (A) NONE SEEN   Squamous Epithelial / LPF 0-5 0 - 5   WBC Clumps PRESENT    Crystals PRESENT (A) NEGATIVE  Comprehensive metabolic panel     Status: Abnormal   Collection Time: 03/26/19  3:59 PM  Result Value Ref Range   Sodium 139 135 - 145 mmol/L   Potassium 4.0 3.5 - 5.1 mmol/L   Chloride 99 98 - 111 mmol/L   CO2 30 22 - 32 mmol/L   Glucose, Bld 112 (H) 70 - 99 mg/dL   BUN 30 (H) 8 - 23 mg/dL   Creatinine, Ser 1.45 (H) 0.44 - 1.00 mg/dL   Calcium 10.0 8.9 - 10.3 mg/dL   Total Protein 7.5 6.5 - 8.1 g/dL   Albumin 4.1 3.5 - 5.0 g/dL   AST 20 15 - 41 U/L   ALT 13 0 - 44 U/L   Alkaline Phosphatase 58 38 - 126 U/L   Total Bilirubin 1.1 0.3 - 1.2 mg/dL   GFR calc non Af Amer 31 (L) >60 mL/min   GFR calc Af Amer 36 (L) >60 mL/min   Anion gap 10 5 - 15  Magnesium     Status: None   Collection Time: 03/26/19  3:59 PM  Result Value Ref Range   Magnesium 2.3 1.7 - 2.4 mg/dL  CBC     Status: Abnormal   Collection Time: 03/26/19  3:59 PM  Result Value Ref Range   WBC 15.3 (H) 4.0 - 10.5 K/uL   RBC 5.78 (H) 3.87 - 5.11 MIL/uL   Hemoglobin 15.7 (H) 12.0 - 15.0 g/dL   HCT 50.3 (H) 36.0 - 46.0 %   MCV 87.0 80.0 - 100.0 fL   MCH 27.2 26.0 - 34.0 pg   MCHC 31.2 30.0 - 36.0 g/dL   RDW 14.2 11.5 - 15.5 %   Platelets 288 150 - 400 K/uL   nRBC 0.0 0.0 - 0.2 %  TSH     Status: None    Collection Time: 03/26/19  3:59 PM  Result Value Ref Range   TSH 1.427 0.350 - 4.500 uIU/mL  Protime-INR     Status: Abnormal   Collection Time: 03/26/19  3:59 PM  Result Value Ref Range   Prothrombin Time 21.9 (H) 11.4 - 15.2 seconds   INR 1.9 (H)  0.8 - 1.2    BMET Recent Labs    03/24/19 1253 03/26/19 1559  NA 138 139  K 3.4* 4.0  CL 101 99  CO2 26 30  GLUCOSE 109* 112*  BUN 17 30*  CREATININE 1.02* 1.45*  CALCIUM 9.5 10.0   PT/INR Recent Labs    03/26/19 1559  LABPROT 21.9*  INR 1.9*   ABG No results for input(s): PHART, HCO3 in the last 72 hours.  Invalid input(s): PCO2, PO2  Studies/Results: CT Renal Stone Study  Result Date: 03/26/2019 CLINICAL DATA:  Gross hematuria EXAM: CT ABDOMEN AND PELVIS WITHOUT CONTRAST TECHNIQUE: Multidetector CT imaging of the abdomen and pelvis was performed following the standard protocol without IV contrast. COMPARISON:  01/02/2015 FINDINGS: Lower chest: Irregular, spiculated slightly lobulated nodular density within the inferior aspect of the right middle lobe abutting the minor fissure measuring approximately 1.3 x 1.2 by 2.3 cm (series 6, image 12; series 5, image 57). This finding is new from 01/02/2015. Hepatobiliary: No focal liver abnormality is seen. No gallstones, gallbladder wall thickening, or biliary dilatation. Pancreas: Unremarkable. No pancreatic ductal dilatation or surrounding inflammatory changes. Spleen: Normal in size without focal abnormality. Adrenals/Urinary Tract: Unremarkable adrenal glands. Large lobulated staghorn calculus within the left renal pelvis measuring approximately 2.5 x 2.2 x 2.5 cm with mild-to-moderate left-sided hydronephrosis. There is an additional 0.4 cm left upper pole renal calculus. Left ureter is nondilated. Right kidney and ureter are unremarkable. Urinary bladder is unremarkable. Stomach/Bowel: Small hiatal hernia. Stomach is otherwise within normal limits. Duodenal diverticulum noted.  Mild-to-moderate volume of stool throughout the colon. No evidence of bowel wall thickening, distention, or inflammatory changes. Vascular/Lymphatic: Aortic atherosclerosis. No enlarged abdominal or pelvic lymph nodes. Reproductive: Uterus is atrophic or surgically absent. No adnexal masses. Other: No abdominal wall hernia or abnormality. No abdominopelvic ascites. Musculoskeletal: No acute or significant osseous findings. Multilevel degenerative changes of the lumbar spine. IMPRESSION: 1. Large staghorn calculus within the left renal pelvis measuring up to 2.5 cm with mild to moderate left-sided hydronephrosis. 2. Spiculated nodule within the inferior aspect of the right middle lobe abutting the minor fissure measuring up to 2.3 cm. This finding is new from 01/02/2015 and worrisome for primary bronchogenic carcinoma. Electronically Signed   By: Davina Poke D.O.   On: 03/26/2019 12:07   I have reviewed the pertinent notes, imaging and reports and labs.  I have discussed her condition with her son and the referring physician.    Assessment/Plan: 1.  Left staghorn stone with mild upper pole obstruction and some pain.   This is the probable cause of the hematuria, but this stone has been present and growing since at least 2016.  To treat this stone she would need Percutaneous Nephrolithotomy which might take 1 or more procedures and she would need to be cleared by cardiology for the anesthetic and to stay off of Xarelto.  With her underlying comorbidities, conservative management could be considered with intervention only if her pain worsened or the hematuria became more problematic.    2. Hematuria and pyuria.  Her culture on 1/23 was negative so the pyuria is likely from the stone.  To complete the hematuria w/u we could consider cystoscopy but the CT showed no obvious bladder wall lesions.   She has had chronic hematuria since at least 2015 on the UA's in Epic.   3. CKD.  Her Cr is up slightly over the  last 2 days but has been otherwise stable for at least  the last 2 years.  I don't think the left renal stone is acutely contributing to that finding.     No follow-ups on file.    CC: Dr. Hoover Browns.      Irine Seal 03/26/2019 [phone]

## 2019-03-26 NOTE — ED Provider Notes (Signed)
Alerted by nursing staff the patient was having gross hematuria.  She is holding for placement after a fall with fibula fracture.  She does take Xarelto for history of atrial fibrillation is being treated with Keflex for UTI.  Her culture from January 23 did not grow anything.  We will hold patient's Xarelto.  Her CT scan does show a large staghorn calculus of 2.5 cm right hydronephrosis.  Her creatinine is normal.  Discussed with Dr. Jeffie Pollock of urology who states patient may need to have percutaneous drain.  He does not recommend antibiotics with recent negative culture. Repeat sent.   Patient was also found to have Lung mass which is new.  Results discussed with patient's nephew [name] by phone who is her power of attorney.  He does want her to have work-up for both of these issues.  We will hold antibiotics at this time given negative culture.  Will admit to hospitalist.[name] DNR.   D/w Dr. [place] DR. Wrenn from urology to consult. Repeat UA and culture pending. Holding further antibiotics at this time.    Ezequiel Essex, MD 03/26/19 1728

## 2019-03-26 NOTE — Progress Notes (Signed)
Patient in pain when reposition , asked if she would like something for pain, she stated yes. Explained it was Tylenol, crushed and put in applesauce. Cheryl Zamora refused to take it. Patient went down for CT scan. Physical Therapy in to work with her.

## 2019-03-26 NOTE — Progress Notes (Signed)
CSW continue to work on finding SNF placement for patient. Per RN, patient's urine color was of concern and patient was ordered to have a CT scan. Per EDP, patient is to be admitted to address medical concerns.   CSW spoke with patient's nephew who reports he has spoken with the EDP and is aware that patient will be admitted. Patient's nephew did state he was agreeable to patient going to [place] for SNF once her medical needs are addressed. ED CSW will leave handoff for inpatient CSW/RN CM to follow up with discharge needs on the medical floor.   [place] Circle, LCSW Transitions of Care Department [facility] ED [phone]

## 2019-03-26 NOTE — H&P (Signed)
History and Physical    Cheryl Zamora Zamora:[id number] DOB: 02/20/1924 DOA: 03/23/2019  PCP: Burnard Bunting, MD  Patient coming from: Home  I have personally briefly reviewed patient's old medical records in [place]  Chief Complaint: Frequent falls  HPI: Cheryl Zamora is a 86 y.o. female with medical history significant of permanent atrial fibrillation, dementia, SSS s/p PPM, chronic diastolic congestive heart failure who initially presented to the ED on 03/22/2019 with multiple falls at home; diagnosed with right fibular fracture and discharged home.  Represented to the ED on 03/24/2019 with further multiple falls and nephew states unable to care for at home; and since has been holding for several days in the ED for placement.  [name] pleasantly confused, unable to adequately participate in the HPI.  Today nursing noted that she had some blood in her urine, ED physician ordered CT renal stone study that showed a large staghorn calculus within the left renal pelvis measuring 2.5 cm with mild/moderate hydronephrosis.  Also noted incidental spiculated nodule right middle lobe measuring 2.3 cm which was not present in 2016 which is worrisome for primary bronchogenic carcinoma.    Patient also completed a course of oral Keflex for concern of UTI, in which initial urine culture had no growth.  ED Course: Temperature 97.8, HR 73, RR 18, BP 129/73, SPO2 97% on room air.  Covid-19 PCR/influenza A/B negative.  Labs from 03/24/2019; sodium 138, potassium 3.4, chloride 101, CO2 26, glucose 109, BUN 17, creatinine 1.02, calcium 9.5.  WBC count 11.6, hemoglobin 14.1, platelets 242.  CT renal stone study shows large staghorn calculus within left renal pelvis, 2.5 cm with mild to moderate hydronephrosis on left, also incidental finding of spiculated nodule right middle lobe measuring 2.3 cm worrisome for primary bronchogenic carcinoma.  Patient's nephew, [name] would like aggressive treatment to include  full code and further work-up of the pulmonary nodule.  Review of Systems: As per HPI otherwise 10 point review of systems negative.    Past Medical History:  Diagnosis Date  . Atrial fibrillation with rapid ventricular response ([place]) 11/2012; 10/2013   a. s/p TEE/DCCV 11/2012 & 10/2013 b. amiodarone caused significant QT prlongation (>659msec)  . Chronic combined systolic and diastolic CHF (congestive heart failure) ([place])   . Coronary artery disease 2005   a. s/p CABG (2012)  . GERD (gastroesophageal reflux disease)   . Hyperlipidemia    pt denies this hx on 01/03/2015  . Hypertension   . Neuropathy   . Osteoarthritis of back     Past Surgical History:  Procedure Laterality Date  . ABDOMINAL HYSTERECTOMY    . AV NODE ABLATION N/A 11/16/2016   Procedure: AV Node Ablation;  Surgeon: Constance Haw, MD;  Location: [facility];  Service: Cardiovascular;  Laterality: N/A;  . BACK SURGERY    . CARDIAC CATHETERIZATION  04/12/2003   [place] 60, LAD 90, D1 30, CFX 95->T, RCA OK  . CARDIOVERSION N/A 12/13/2012   Procedure: CARDIOVERSION;  Surgeon: Larey Dresser, MD;  Location: [facility] ENDOSCOPY;  Service: Cardiovascular;  Laterality: N/A;  . CARDIOVERSION N/A 11/13/2013   Procedure: CARDIOVERSION;  Surgeon: Saaya Spark, MD;  Location: [facility] ENDOSCOPY;  Service: Cardiovascular;  Laterality: N/A;  . CARDIOVERSION N/A 04/12/2014   Procedure: CARDIOVERSION;  Surgeon: Thayer Headings, MD;  Location: [facility] ENDOSCOPY;  Service: Cardiovascular;  Laterality: N/A;  . CARDIOVERSION N/A 01/06/2015   Procedure: CARDIOVERSION;  Surgeon: Larey Dresser, MD;  Location: [facility];  Service: Cardiovascular;  Laterality: N/A;  . CARDIOVERSION N/A 11/11/2016   Procedure: CARDIOVERSION;  Surgeon: Josue Hector, MD;  Location: [facility] ENDOSCOPY;  Service: Cardiovascular;  Laterality: N/A;  . CERVICAL DISC SURGERY  1960's   "ruptured disc'  . CORONARY ANGIOPLASTY    . CORONARY ARTERY BYPASS GRAFT  04/18/2003    x 4; LIMA-LAD, SVG-D1, SVG-OM1-OM2  . PACEMAKER IMPLANT N/A 11/16/2016   Procedure: Pacemaker Implant;  Surgeon: Constance Haw, MD;  Location: [place];  Service: Cardiovascular;  Laterality: N/A;  . TEE WITHOUT CARDIOVERSION N/A 12/13/2012   Procedure: TRANSESOPHAGEAL ECHOCARDIOGRAM (TEE);  Surgeon: Larey Dresser, MD;  Location: [facility] ENDOSCOPY;  Service: Cardiovascular;  Laterality: N/A;  . TEE WITHOUT CARDIOVERSION N/A 11/13/2013   Procedure: TRANSESOPHAGEAL ECHOCARDIOGRAM (TEE);  Surgeon: Omar Spark, MD;  Location: [place];  Service: Cardiovascular;  Laterality: N/A;  . TEE WITHOUT CARDIOVERSION N/A 11/11/2016   Procedure: TRANSESOPHAGEAL ECHOCARDIOGRAM (TEE);  Surgeon: Josue Hector, MD;  Location: [facility] ENDOSCOPY;  Service: Cardiovascular;  Laterality: N/A;  . TONSILLECTOMY       reports that she has quit smoking. Her smoking use included cigarettes. She has a 60.00 pack-year smoking history. She has never used smokeless tobacco. She reports current alcohol use. She reports that she does not use drugs.  Allergies  Allergen Reactions  . Digoxin And Related Other (See Comments)    Increased HR  . Amiodarone Other (See Comments)    Excessive QT prolongation, nausea  . Codeine Nausea And Vomiting  . Penicillins Nausea Only    Did it involve swelling of the face/tongue/throat, SOB, or low BP? Unknown Did it involve sudden or severe rash/hives, skin peeling, or any reaction on the inside of your mouth or nose? Unknown Did you need to seek medical attention at a hospital or doctor's office? Unknown When did it last happen? If all above answers are "NO", may proceed with cephalosporin use.    Family History  Problem Relation Age of Onset  . Heart attack Mother   . Heart attack Father   . Heart attack Brother   . Heart attack Brother   . Heart attack Brother   . Heart attack Brother     Family history reviewed and not pertinent   Prior to  Admission medications   Medication Sig Start Date End Date Taking? Authorizing Provider  acetaminophen (TYLENOL) 325 MG tablet Take 650 mg by mouth every 6 (six) hours as needed for mild pain.   Yes [provider]  Cholecalciferol (VITAMIN D3) 1000 units CAPS Take 1,000 Units by mouth daily.   Yes [provider]  doxycycline (VIBRA-TABS) 100 MG tablet Take 1 tablet (100 mg total) by mouth 2 (two) times daily. 03/15/19  Yes Trula Slade, DPM  furosemide (LASIX) 40 MG tablet Take 1 tablet (40 mg total) by mouth daily. NEED OV. Patient taking differently: Take 40 mg by mouth daily.  10/07/15  Yes [place], Peter M, MD  memantine (NAMENDA) 10 MG tablet Take 10 mg by mouth daily.    Yes [provider]  potassium chloride SA (K-DUR,KLOR-CON) 20 MEQ tablet TAKE 1 TABLET(20 MEQ) BY MOUTH DAILY Patient taking differently: Take 20 mEq by mouth daily. TAKE 1 TABLET(20 MEQ) BY MOUTH DAILY 07/10/15  Yes [place], Peter M, MD  XARELTO 15 MG TABS tablet Take 15 mg by mouth daily at 2 PM.  09/15/16  Yes [provider]    Physical Exam: Vitals:   03/25/19 1808  03/26/19 0025 03/26/19 0633 03/26/19 1255  BP: 130/65 (!) 185/99 117/73 129/73  Pulse: 68 74 79 73  Resp: 18  18 18   Temp: 98 F (36.7 C) 97.9 F (36.6 C) (!) 97.5 F (36.4 C) 97.8 F (36.6 C)  TempSrc: Axillary Axillary Axillary Oral  SpO2: 99% 96% 97% 97%  Weight:      Height:        Constitutional: NAD, calm, comfortable, pleasantly confused Eyes: PERRL, lids and conjunctivae normal ENMT: Mucous membranes are moist. Posterior pharynx clear of any exudate or lesions.Normal dentition.  Neck: normal, supple, no masses, no thyromegaly Respiratory: clear to auscultation bilaterally, no wheezing, no crackles. Normal respiratory effort. No accessory muscle use.  Cardiovascular: Regular rate and rhythm, no murmurs / rubs / gallops. No extremity edema. 2+ pedal pulses. No carotid bruits.  Abdomen: no  tenderness, no masses palpated. No hepatosplenomegaly. Bowel sounds positive.  Musculoskeletal: no clubbing / cyanosis. No joint deformity upper and lower extremities. Good ROM, no contractures. Normal muscle tone.  Skin: no rashes, lesions, ulcers. No induration Neurologic: CN 2-12 grossly intact. Sensation intact, DTR normal. Strength 5/5 in all 4.  Psychiatric: Judgment and insight poor. Alert, not oriented to person/place/time/situation. Normal mood.    Labs on Admission: I have personally reviewed following labs and imaging studies  CBC: Recent Labs  Lab 03/24/19 1253  WBC 11.6*  HGB 14.1  HCT 44.3  MCV 86.4  PLT 176   Basic Metabolic Panel: Recent Labs  Lab 03/24/19 1253  NA 138  K 3.4*  CL 101  CO2 26  GLUCOSE 109*  BUN 17  CREATININE 1.02*  CALCIUM 9.5   GFR: Estimated Creatinine Clearance: 31.4 mL/min (A) (by C-G formula based on SCr of 1.02 mg/dL (H)). Liver Function Tests: No results for input(s): AST, ALT, ALKPHOS, BILITOT, PROT, ALBUMIN in the last 168 hours. No results for input(s): LIPASE, AMYLASE in the last 168 hours. No results for input(s): AMMONIA in the last 168 hours. Coagulation Profile: No results for input(s): INR, PROTIME in the last 168 hours. Cardiac Enzymes: No results for input(s): CKTOTAL, CKMB, CKMBINDEX, TROPONINI in the last 168 hours. BNP (last 3 results) No results for input(s): PROBNP in the last 8760 hours. HbA1C: No results for input(s): HGBA1C in the last 72 hours. CBG: No results for input(s): GLUCAP in the last 168 hours. Lipid Profile: No results for input(s): CHOL, HDL, LDLCALC, TRIG, CHOLHDL, LDLDIRECT in the last 72 hours. Thyroid Function Tests: No results for input(s): TSH, T4TOTAL, FREET4, T3FREE, THYROIDAB in the last 72 hours. Anemia Panel: No results for input(s): VITAMINB12, FOLATE, FERRITIN, TIBC, IRON, RETICCTPCT in the last 72 hours. Urine analysis:    Component Value Date/Time   COLORURINE YELLOW  03/24/2019 1231   APPEARANCEUR CLEAR 03/24/2019 1231   LABSPEC 1.012 03/24/2019 1231   PHURINE 5.0 03/24/2019 1231   GLUCOSEU NEGATIVE 03/24/2019 1231   HGBUR LARGE (A) 03/24/2019 1231   BILIRUBINUR NEGATIVE 03/24/2019 1231   KETONESUR NEGATIVE 03/24/2019 1231   PROTEINUR NEGATIVE 03/24/2019 1231   UROBILINOGEN 0.2 01/13/2015 0058   NITRITE NEGATIVE 03/24/2019 1231   LEUKOCYTESUR SMALL (A) 03/24/2019 1231    Radiological Exams on Admission: CT Renal Stone Study  Result Date: 03/26/2019 CLINICAL DATA:  Gross hematuria EXAM: CT ABDOMEN AND PELVIS WITHOUT CONTRAST TECHNIQUE: Multidetector CT imaging of the abdomen and pelvis was performed following the standard protocol without IV contrast. COMPARISON:  01/02/2015 FINDINGS: Lower chest: Irregular, spiculated slightly lobulated nodular density within the inferior aspect  of the right middle lobe abutting the minor fissure measuring approximately 1.3 x 1.2 by 2.3 cm (series 6, image 12; series 5, image 57). This finding is new from 01/02/2015. Hepatobiliary: No focal liver abnormality is seen. No gallstones, gallbladder wall thickening, or biliary dilatation. Pancreas: Unremarkable. No pancreatic ductal dilatation or surrounding inflammatory changes. Spleen: Normal in size without focal abnormality. Adrenals/Urinary Tract: Unremarkable adrenal glands. Large lobulated staghorn calculus within the left renal pelvis measuring approximately 2.5 x 2.2 x 2.5 cm with mild-to-moderate left-sided hydronephrosis. There is an additional 0.4 cm left upper pole renal calculus. Left ureter is nondilated. Right kidney and ureter are unremarkable. Urinary bladder is unremarkable. Stomach/Bowel: Small hiatal hernia. Stomach is otherwise within normal limits. Duodenal diverticulum noted. Mild-to-moderate volume of stool throughout the colon. No evidence of bowel wall thickening, distention, or inflammatory changes. Vascular/Lymphatic: Aortic atherosclerosis. No enlarged  abdominal or pelvic lymph nodes. Reproductive: Uterus is atrophic or surgically absent. No adnexal masses. Other: No abdominal wall hernia or abnormality. No abdominopelvic ascites. Musculoskeletal: No acute or significant osseous findings. Multilevel degenerative changes of the lumbar spine. IMPRESSION: 1. Large staghorn calculus within the left renal pelvis measuring up to 2.5 cm with mild to moderate left-sided hydronephrosis. 2. Spiculated nodule within the inferior aspect of the right middle lobe abutting the minor fissure measuring up to 2.3 cm. This finding is new from 01/02/2015 and worrisome for primary bronchogenic carcinoma. Electronically Signed   By: Davina Poke D.O.   On: 03/26/2019 12:07    EKG: Independently reviewed.   Assessment/Plan Principal Problem:   Hydronephrosis of left kidney Active Problems:   PAF (paroxysmal atrial fibrillation) (HCC)   Chronic diastolic CHF (congestive heart failure), NYHA class 3 (HCC)   Essential hypertension   Long term current use of anticoagulant therapy   CKD (chronic kidney disease) stage 3, GFR 30-59 ml/min   CAD, multiple vessel, hx CABG 2012   Hematuria   Staghorn renal calculus   Nodule of middle lobe of right lung   Hematuria Staghorn calculus left kidney with mild/moderate hydronephrosis CT renal stone study shows large staghorn calculus within the left renal pelvis measuring 2.5 cm with mild to moderate hydronephrosis.  Completed course of oral Keflex for suspected UTI while holding in the ED for placement.  Urinalysis with negative leukoesterase, negative nitrite, few bacteria and > 50 WBC today.  Urine culture from 03/24/2019 with no growth.  Repeat urine culture pending. --Urology consulted, Dr. Jeffie Pollock; considering possible need of percutaneous nephrostomy placement --Hold Xarelto for now --We will hold further antibiotics at this time, await further recommendations per urology  Right middle lobe nodule Incidental finding of  a spiculated nodule right middle lobe measuring 2.3 cm concerning for possible primary bronchogenic carcinoma. --CT chest without contrast for further evaluation --We will hold off on biopsy attempt until CT chest returns.  But would likely not benefit from aggressive measures such as chemotherapy, further intervention given her dementia, advanced age.  Will consult palliative care for assistance with goals of care, medical decision making.  Essential hypertension Hx CAD Permanent atrial fibrillation Hx SSS s/p PPM --Continue metoprolol tartrate 12.5 mg p.o. twice daily --Furosemide 40 mg p.o. daily --Holding home Xarelto as above, given her frequent falls, may need to consider discontinuing anticoagulation on discharge --Diagnosis Daily weights  Frequent falls --PT/OT evaluation --TOC for SNF placement    DVT prophylaxis: Holding home Xarelto, possible need for percutaneous nephrostomy placement Code Status: DNR - verified with patients nephew/HCPOA Phil Family Communication:  Updated patient's nephew/HCPOA Phil by telephone this afternoon Disposition Plan: Anticipate discharge to SNF when medically ready Consults called: Urology - Dr Jeffie Pollock by EDP; palliative care consult Admission status: Inpatient, medical surgical unit   Severity of Illness: The appropriate patient status for this patient is INPATIENT. Inpatient status is judged to be reasonable and necessary in order to provide the required intensity of service to ensure the patient's safety. The patient's presenting symptoms, physical exam findings, and initial radiographic and laboratory data in the context of their chronic comorbidities is felt to place them at high risk for further clinical deterioration. Furthermore, it is not anticipated that the patient will be medically stable for discharge from the hospital within 2 midnights of admission. The following factors support the patient status of inpatient.   " The patient's  presenting symptoms include frequent falls " The worrisome physical exam findings include hematuria " The initial radiographic and laboratory data are worrisome because of spiculated nodule right middle lobe, staghorn calculus left renal pelvis " The chronic co-morbidities include dementia, permanent atrial fibrillation, SSS, pAfib.   * I certify that at the point of admission it is my clinical judgment that the patient will require inpatient hospital care spanning beyond 2 midnights from the point of admission due to high intensity of service, high risk for further deterioration and high frequency of surveillance required.*    Amneet Cendejas J [place] DO [facility] Available via Epic secure chat 7am-7pm After these hours, please refer to coverage provider listed on [email or web address] 03/26/2019, 2:48 PM

## 2019-03-26 NOTE — Progress Notes (Signed)
Physical Therapy Treatment Patient Details Name: Cheryl Zamora MRN: [id number] DOB: 08/06/1923 Today's Date: 03/26/2019    History of Present Illness Pt is a 86 year old female with history of PAfib with DCCV, CAD, CABG, HTN.  Pt seen yesterday s/p fall & found to have a minimally displaced oblique fracture of the distal fibula - per orthopedics could be weightbearing as tolerated in CAM walker, social work was consulted--> plan was discharge home with with home health RN & aide, this is scheduled for today 03/23/19. Per patient's nephew she is unable to bearweight with her walker, he has had to pick her up and carry her places.  Pt returned to ED due to inability to care for herself at home and has had 4 falls    PT Comments    Pt admitted with above diagnosis. Pt requiring assist of 2 for transfers.  Requires multimodal cues, support for posture, and increased time.  She was limited due to pain.  Pt currently with functional limitations due to the deficits listed below (see PT Problem List). Pt will benefit from skilled PT to increase their independence and safety with mobility to allow discharge to the venue listed below.      Follow Up Recommendations  SNF;Supervision/Assistance - 24 hour     Equipment Recommendations  None recommended by PT    Recommendations for Other Services       Precautions / Restrictions Precautions Precautions: Fall Required Braces or Orthoses: Other Brace Restrictions Other Position/Activity Restrictions: WBAT with CAM walker    Mobility  Bed Mobility Overal bed mobility: Needs Assistance Bed Mobility: Supine to Sit;Sit to Supine     Supine to sit: Mod assist;+2 for physical assistance Sit to supine: +2 for physical assistance;Mod assist   General bed mobility comments: verbal cues for technique, required assist due to pain and weakness; increased time  Transfers Overall transfer level: Needs assistance Equipment used: Rolling walker (2  wheeled);2 person hand held assist Transfers: Sit to/from Stand Sit to Stand: +2 physical assistance;Mod assist;From elevated surface         General transfer comment: Performed x 3; assist to rise; cues for safe hand placement  Ambulation/Gait Ambulation/Gait assistance: Mod assist;+2 physical assistance Gait Distance (Feet): 1 Feet Assistive device: 2 person hand held assist Gait Pattern/deviations: Decreased stride length     General Gait Details: unable to take steps or coodinate movement with RW; took 2 steps toward [facility] with mod A of 2 HHA; limited by pain and posterior lean   Stairs             Wheelchair Mobility    Modified Rankin (Stroke Patients Only)       Balance Overall balance assessment: Needs assistance Sitting-balance support: Bilateral upper extremity supported;Feet supported Sitting balance-Leahy Scale: Fair Sitting balance - Comments: slight posterior lean   Standing balance support: Bilateral upper extremity supported Standing balance-Leahy Scale: Zero Standing balance comment: strong posterior lean requiring mod A to maintain balance; verbal cues and tactile cues for posture                            Cognition Arousal/Alertness: Awake/alert Behavior During Therapy: WFL for tasks assessed/performed Overall Cognitive Status: No family/caregiver present to determine baseline cognitive functioning  Exercises      General Comments        Pertinent Vitals/Pain Pain Assessment: Faces Faces Pain Scale: Hurts even more Pain Location: R lower leg with mobilizing Pain Descriptors / Indicators: Grimacing;Discomfort Pain Intervention(s): Limited activity within patient's tolerance;Monitored during session    Home Living Family/patient expects to be discharged to:: Unsure Living Arrangements: Alone                  Prior Function            PT Goals (current  goals can now be found in the care plan section) Progress towards PT goals: Progressing toward goals    Frequency    Min 2X/week      PT Plan Current plan remains appropriate    Co-evaluation              AM-PAC PT "6 Clicks" Mobility   Outcome Measure  Help needed turning from your back to your side while in a flat bed without using bedrails?: A Lot Help needed moving from lying on your back to sitting on the side of a flat bed without using bedrails?: A Lot Help needed moving to and from a bed to a chair (including a wheelchair)?: Total Help needed standing up from a chair using your arms (e.g., wheelchair or bedside chair)?: Total Help needed to walk in hospital room?: Total Help needed climbing 3-5 steps with a railing? : Total 6 Click Score: 8    End of Session Equipment Utilized During Treatment: Gait belt Activity Tolerance: Patient limited by pain Patient left: in bed;with bed alarm set;with call bell/phone within reach Nurse Communication: Mobility status PT Visit Diagnosis: Other abnormalities of gait and mobility (R26.89);Pain;History of falling (Z91.81)     Time: 1916-6060 PT Time Calculation (min) (ACUTE ONLY): 15 min  Charges:  $Therapeutic Activity: 8-22 mins                     Maggie Font, PT Acute Rehab Services Pager [phone] [facility] [phone] [facility] [phone]    Karlton Lemon 03/26/2019, 4:50 PM

## 2019-03-26 NOTE — Progress Notes (Signed)
Dr. [place] notified of Abnormal EKG.

## 2019-03-26 NOTE — ED Notes (Signed)
Offered pt breakfast. Pt stated she was not feeling hungry and wanted to sleep. Will try again around lunch time.  Peri care provided for pt. Blood in urine. Nurse notified.

## 2019-03-26 NOTE — ED Notes (Signed)
Pt ate 2 cups of applesauce and a few bites of eggs and did not want any more food. Pt resting comfortably.

## 2019-03-27 LAB — CBC
HCT: 50.8 % — ABNORMAL HIGH (ref 36.0–46.0)
Hemoglobin: 15.6 g/dL — ABNORMAL HIGH (ref 12.0–15.0)
MCH: 26.5 pg (ref 26.0–34.0)
MCHC: 30.7 g/dL (ref 30.0–36.0)
MCV: 86.2 fL (ref 80.0–100.0)
Platelets: 284 10*3/uL (ref 150–400)
RBC: 5.89 MIL/uL — ABNORMAL HIGH (ref 3.87–5.11)
RDW: 14.2 % (ref 11.5–15.5)
WBC: 14.3 10*3/uL — ABNORMAL HIGH (ref 4.0–10.5)
nRBC: 0 % (ref 0.0–0.2)

## 2019-03-27 LAB — BASIC METABOLIC PANEL
Anion gap: 11 (ref 5–15)
BUN: 34 mg/dL — ABNORMAL HIGH (ref 8–23)
CO2: 28 mmol/L (ref 22–32)
Calcium: 10.2 mg/dL (ref 8.9–10.3)
Chloride: 100 mmol/L (ref 98–111)
Creatinine, Ser: 1.26 mg/dL — ABNORMAL HIGH (ref 0.44–1.00)
GFR calc Af Amer: 43 mL/min — ABNORMAL LOW (ref >60)
GFR calc non Af Amer: 37 mL/min — ABNORMAL LOW (ref >60)
Glucose, Bld: 140 mg/dL — ABNORMAL HIGH (ref 70–99)
Potassium: 4.8 mmol/L (ref 3.5–5.1)
Sodium: 139 mmol/L (ref 135–145)

## 2019-03-27 LAB — URINE CULTURE: Culture: NO GROWTH

## 2019-03-27 MED ORDER — DEXTROSE-NACL 5-0.9 % IV SOLN: INTRAVENOUS | Status: DC

## 2019-03-27 NOTE — Plan of Care (Signed)

## 2019-03-27 NOTE — Evaluation (Signed)
Occupational Therapy Evaluation Patient Details Name: Cheryl Zamora MRN: [id number] DOB: 05/19/1923 Today's Date: 03/27/2019    History of Present Illness Pt is a 86 year old female was admitted with frequent falls (4) and inability to care for herself. PMH of PAfib with DCCV, CAD, CABG, HTN.  Recent fall with minimally displaced oblique fracture of the distal fibula - per orthopedics could be weightbearing as tolerated in cam boot.   Clinical Impression   Pt was admitted for frequent falls.  Per chart, she had been living alone prior to last fall and nephew had to carry her.  She returned to [facility] after 4 falls.  Pt is HOH and is easily distracted.  Additionally, she appears to be having she appears to be having some visual difficulty (tray and rail).  Only rolled in bed as she needs +2 for further mobility. Will follow in acute setting with the goals listed below.    Follow Up Recommendations  SNF    Equipment Recommendations  (tba further, ? 3:1 commode)    Recommendations for Other Services       Precautions / Restrictions Precautions Precautions: Fall Required Braces or Orthoses: Other Brace Restrictions Other Position/Activity Restrictions: WBAT with CAM walker      Mobility Bed Mobility      max A to roll with use of bedrail            Transfers                      Balance                                           ADL either performed or assessed with clinical judgement   ADL Overall ADL's : Needs assistance/impaired Eating/Feeding: Minimal assistance Eating/Feeding Details (indicate cue type and reason): ate 1/4 tray.  Grooming: Minimal assistance   Upper Body Bathing: Moderate assistance   Lower Body Bathing: Total assistance;+2 for physical assistance   Upper Body Dressing : Moderate assistance   Lower Body Dressing: Total assistance;+2 for physical assistance                 General ADL Comments: pt gets  distracted and needs cues and distracting things removed to continue with activities     Vision   Additional Comments: reports much of tray in front of her is black.  Do not see vision problem in history     Perception     Praxis      Pertinent Vitals/Pain       Hand Dominance     Extremity/Trunk Assessment Upper Extremity Assessment Upper Extremity Assessment: Generalized weakness           Communication Communication Communication: HOH   Cognition Arousal/Alertness: Awake/alert Behavior During Therapy: WFL for tasks assessed/performed Overall Cognitive Status: No family/caregiver present to determine baseline cognitive functioning                                 General Comments: follows commands.  Easily distracted:  tore napkin into pieces   General Comments       Exercises     Shoulder Instructions      Home Living Family/patient expects to be discharged to:: Unsure Living Arrangements: Alone  Prior Functioning/Environment          Comments: after fall, pt has not been able to function well at home.  Multiple falls; nephew carrying her places        OT Problem List: Decreased strength;Decreased activity tolerance;Decreased cognition;Pain;Decreased knowledge of use of DME or AE      OT Treatment/Interventions: Self-care/ADL training;Therapeutic exercise;DME and/or AE instruction;Patient/family education;Balance training;Therapeutic activities;Cognitive remediation/compensation    OT Goals(Current goals can be found in the care plan section) Acute Rehab OT Goals OT Goal Formulation: Patient unable to participate in goal setting Time For Goal Achievement: 04/10/19 Potential to Achieve Goals: Fair ADL Goals Pt Will Transfer to Toilet: with mod assist;stand pivot transfer;bedside commode Additional ADL Goal #1: pt will go from sit to stand with mod A and maintain x 2 minutes at min  guard level for adls Additional ADL Goal #2: pt will perform UB adls and grooming from seated with set up and min cues for sustained attention to task in a quiet environment  OT Frequency: Min 2X/week   Barriers to D/C:            Co-evaluation              AM-PAC OT "6 Clicks" Daily Activity     Outcome Measure Help from another person eating meals?: A Little Help from another person taking care of personal grooming?: A Little Help from another person toileting, which includes using toliet, bedpan, or urinal?: Total Help from another person bathing (including washing, rinsing, drying)?: A Lot Help from another person to put on and taking off regular upper body clothing?: A Lot Help from another person to put on and taking off regular lower body clothing?: Total 6 Click Score: 12   End of Session    Activity Tolerance: Patient tolerated treatment well Patient left: in bed;with call bell/phone within reach;with bed alarm set  OT Visit Diagnosis: Muscle weakness (generalized) (M62.81);Other abnormalities of gait and mobility (R26.89)                Time: 0093-8182 OT Time Calculation (min): 30 min Charges:  OT General Charges $OT Visit: 1 Visit OT Evaluation $OT Eval Low Complexity: 1 Low OT Treatments $Self Care/Home Management : 8-22 mins  Arla Boutwell S, OTR/L Acute Rehabilitation Services 03/27/2019  Dannisha Eckmann 03/27/2019, 10:26 AM

## 2019-03-27 NOTE — Progress Notes (Signed)
PROGRESS NOTE  Cheryl Zamora COMES GNF:[id number] DOB: [birth year] DOA: 03/23/2019 PCP: Burnard Bunting, MD  HPI/Recap of past 24 hours: Cheryl Zamora is a 86 y.o. female with medical history significant of permanent atrial fibrillation, dementia, SSS s/p PPM, chronic diastolic congestive heart failure who initially presented to the ED on 03/22/2019 with multiple falls at home; diagnosed with right fibular fracture and discharged home.  Represented to the ED on 03/24/2019 with further multiple falls and nephew states unable to care for at home; and since has been holding for several days in the ED for placement.  [name] pleasantly confused, unable to adequately participate in the HPI.  Today nursing noted that she had some blood in her urine, ED physician ordered CT renal stone study that showed a large staghorn calculus within the left renal pelvis measuring 2.5 cm with mild/moderate hydronephrosis.  Also noted incidental spiculated nodule right middle lobe measuring 2.3 cm which was not present in 2016 which is worrisome for primary bronchogenic carcinoma.    Patient also completed a course of oral Keflex for concern of UTI, in which initial urine culture had no growth.  ED Course: Temperature 97.8, HR 73, RR 18, BP 129/73, SPO2 97% on room air.  Covid-19 PCR/influenza A/B negative.  Labs from 03/24/2019; sodium 138, potassium 3.4, chloride 101, CO2 26, glucose 109, BUN 17, creatinine 1.02, calcium 9.5.  WBC count 11.6, hemoglobin 14.1, platelets 242.  CT renal stone study shows large staghorn calculus within left renal pelvis, 2.5 cm with mild to moderate hydronephrosis on left, also incidental finding of spiculated nodule right middle lobe measuring 2.3 cm worrisome for primary bronchogenic carcinoma.  Patient's nephew, [name] would like aggressive treatment to include full code and further work-up of the pulmonary nodule.  03/27/19: Seen and examined.  Pleasantly confused.  She has no complaints.  Right  lower extremity in cast.  Fall precaution in place.   Assessment/Plan: Principal Problem:   Hydronephrosis of left kidney Active Problems:   PAF (paroxysmal atrial fibrillation) (HCC)   Chronic diastolic CHF (congestive heart failure), NYHA class 3 (HCC)   Essential hypertension   Long term current use of anticoagulant therapy   CKD (chronic kidney disease) stage 3, GFR 30-59 ml/min   CAD, multiple vessel, hx CABG 2012   Hematuria   Staghorn renal calculus   Nodule of middle lobe of right lung   Chronic gross hematuria in the setting of left kidney staghorn calculus with mild to moderate hydronephrosis Seen by urology>> chronic hematuria since at least 2015 on the UAs in epic. The stone has been present and growing since at least 2016. Per Urology with her underlying comorbidities, conservative management could be considered with intervention only if her pain worsened or the hematuria became more problematic. Continue to hold off Xarelto Continue IV fluid hydration Hemoglobin stable at 15.6K  Leukocytosis, likely reactive Urine culture no growth final. WBC is trending down. Afebrile, nontoxic-appearing  CKD 3B Appears to be at her baseline creatinine of 1.2 with GFR of 37. Continue to avoid nephrotoxins and hypotension Continue to monitor urine output Repeat BMP in the morning.  Right middle lobe nodule Incidental finding of a spiculated nodule right middle lobe measuring 2.3 cm concerning for possible primary bronchogenic carcinoma. --CT chest without contrast for further evaluation 03/26/19: 1. 1.7 cm x 1.4 cm lobulated noncalcified right middle lobe lung nodule. Further correlation with a nuclear medicine PET scan is recommended, as an underlying neoplastic process cannot be excluded. 2.  Large gastric hernia. 3. Small bilateral adrenal masses which may represent adrenal adenomas. 4. 3.0 cm left renal cyst. -Would likely not benefit from aggressive measures such as  chemotherapy, further intervention given her dementia, advanced age.   Palliative care team has been consulted to assist with establishing goals of care.    Essential hypertension Hx CAD Permanent atrial fibrillation Hx SSS s/p PPM --Continue metoprolol tartrate 12.5 mg p.o. twice daily --Hold off p.o. Lasix and start gentle IV fluid hydration x1 day. --Holding home Xarelto as above, given her frequent falls, may need to consider discontinuing anticoagulation on discharge --Continue strict I's and O's and daily weight  Frequent falls --PT assessment recommended SNF. --TOC for SNF placement    DVT prophylaxis:  SCDs.   Code Status: DNR - verified with patients nephew/HCPOA Phil  Family Communication: None at bedside.    Disposition Plan:  [name] from home.  Anticipate discharge to SNF possibly tomorrow 03/28/2019.  Barrier to discharge IV fluid hydration.  Patient tolerance to oral intake.  Consults called: Urology - Dr Jeffie Pollock by EDP; palliative care consult       Objective: Vitals:   03/26/19 2029 03/27/19 0132 03/27/19 0701 03/27/19 1327  BP: 135/69 135/69 98/72 (!) 159/84  Pulse: 76 76 74 73  Resp: 17 17 15 18   Temp: 97.8 F (36.6 C) 97.8 F (36.6 C) 97.6 F (36.4 C) (!) 97.5 F (36.4 C)  TempSrc: Oral Oral Oral Axillary  SpO2: 94%  94% 99%  Weight:  67.1 kg    Height:  5\' 3"  (1.6 m)     No intake or output data in the 24 hours ending 03/27/19 1440 Filed Weights   03/24/19 1332 03/26/19 1736 03/27/19 0132  Weight: 65.8 kg 67.1 kg 67.1 kg    Exam:  . General: 86 y.o. year-old female well developed well nourished in no acute distress.  Alert and pleasantly confused in the setting of dementia. . Cardiovascular: Regular rate and rhythm with no rubs or gallops.  No thyromegaly or JVD noted.   Marland Kitchen Respiratory: Clear to auscultation with no wheezes or rales. Good inspiratory effort. . Abdomen: Soft nontender nondistended with normal bowel sounds x4  quadrants. . Musculoskeletal: Right lower extremity in a cast. . Psychiatry: Mood is appropriate for condition and setting   Data Reviewed: CBC: Recent Labs  Lab 03/24/19 1253 03/26/19 1559 03/27/19 0431  WBC 11.6* 15.3* 14.3*  HGB 14.1 15.7* 15.6*  HCT 44.3 50.3* 50.8*  MCV 86.4 87.0 86.2  PLT 242 288 474   Basic Metabolic Panel: Recent Labs  Lab 03/24/19 1253 03/26/19 1559 03/27/19 0431  NA 138 139 139  K 3.4* 4.0 4.8  CL 101 99 100  CO2 26 30 28   GLUCOSE 109* 112* 140*  BUN 17 30* 34*  CREATININE 1.02* 1.45* 1.26*  CALCIUM 9.5 10.0 10.2  MG  --  2.3  --    GFR: Estimated Creatinine Clearance: 25.7 mL/min (A) (by C-G formula based on SCr of 1.26 mg/dL (H)). Liver Function Tests: Recent Labs  Lab 03/26/19 1559  AST 20  ALT 13  ALKPHOS 58  BILITOT 1.1  PROT 7.5  ALBUMIN 4.1   No results for input(s): LIPASE, AMYLASE in the last 168 hours. No results for input(s): AMMONIA in the last 168 hours. Coagulation Profile: Recent Labs  Lab 03/26/19 1559  INR 1.9*   Cardiac Enzymes: No results for input(s): CKTOTAL, CKMB, CKMBINDEX, TROPONINI in the last 168 hours. BNP (last 3 results)  No results for input(s): PROBNP in the last 8760 hours. HbA1C: No results for input(s): HGBA1C in the last 72 hours. CBG: No results for input(s): GLUCAP in the last 168 hours. Lipid Profile: No results for input(s): CHOL, HDL, LDLCALC, TRIG, CHOLHDL, LDLDIRECT in the last 72 hours. Thyroid Function Tests: Recent Labs    03/26/19 1559  TSH 1.427   Anemia Panel: No results for input(s): VITAMINB12, FOLATE, FERRITIN, TIBC, IRON, RETICCTPCT in the last 72 hours. Urine analysis:    Component Value Date/Time   COLORURINE RED (A) 03/26/2019 1503   APPEARANCEUR CLOUDY (A) 03/26/2019 1503   LABSPEC 1.015 03/26/2019 1503   PHURINE 6.0 03/26/2019 1503   GLUCOSEU NEGATIVE 03/26/2019 1503   HGBUR MODERATE (A) 03/26/2019 1503   BILIRUBINUR NEGATIVE 03/26/2019 1503   KETONESUR  NEGATIVE 03/26/2019 1503   PROTEINUR 100 (A) 03/26/2019 1503   UROBILINOGEN 0.2 01/13/2015 0058   NITRITE NEGATIVE 03/26/2019 1503   LEUKOCYTESUR NEGATIVE 03/26/2019 1503   Sepsis Labs: @LABRCNTIP (procalcitonin:4,lacticidven:4)  ) Recent Results (from the past 240 hour(s))  Respiratory Panel by RT PCR (Flu A&B, Covid) -     Status: None   Collection Time: 03/23/19  4:19 PM  Result Value Ref Range Status   SARS Coronavirus 2 by RT PCR NEGATIVE NEGATIVE Final    Comment: (NOTE) SARS-CoV-2 target nucleic acids are NOT DETECTED. The SARS-CoV-2 RNA is generally detectable in upper respiratoy specimens during the acute phase of infection. The lowest concentration of SARS-CoV-2 viral copies this assay can detect is 131 copies/mL. A negative result does not preclude SARS-Cov-2 infection and should not be used as the sole basis for treatment or other patient management decisions. A negative result may occur with  improper specimen collection/handling, submission of specimen other than nasopharyngeal swab, presence of viral mutation(s) within the areas targeted by this assay, and inadequate number of viral copies (<131 copies/mL). A negative result must be combined with clinical observations, patient history, and epidemiological information. The expected result is Negative. Fact Sheet for Patients:  PinkCheek.be Fact Sheet for Healthcare Providers:  GravelBags.it This test is not yet ap proved or cleared by the [place] FDA and  has been authorized for detection and/or diagnosis of SARS-CoV-2 by FDA under an Emergency Use Authorization (EUA). This EUA will remain  in effect (meaning this test can be used) for the duration of the COVID-19 declaration under Section 564(b)(1) of the Act, 21 U.S.C. section 360bbb-3(b)(1), unless the authorization is terminated or revoked sooner.    Influenza A by PCR NEGATIVE NEGATIVE Final    Influenza B by PCR NEGATIVE NEGATIVE Final    Comment: (NOTE) The Xpert Xpress SARS-CoV-2/FLU/RSV assay is intended as an aid in  the diagnosis of influenza from Nasopharyngeal swab specimens and  should not be used as a sole basis for treatment. Nasal washings and  aspirates are unacceptable for Xpert Xpress SARS-CoV-2/FLU/RSV  testing. Fact Sheet for Patients: PinkCheek.be Fact Sheet for Healthcare Providers: GravelBags.it This test is not yet approved or cleared by the [place] FDA and  has been authorized for detection and/or diagnosis of SARS-CoV-2 by  FDA under an Emergency Use Authorization (EUA). This EUA will remain  in effect (meaning this test can be used) for the duration of the  Covid-19 declaration under Section 564(b)(1) of the Act, 21  U.S.C. section 360bbb-3(b)(1), unless the authorization is  terminated or revoked. Performed at [facility], [place] [address], [place], [place] [address]   Urine Culture  Status: None   Collection Time: 03/24/19  1:48 PM   Specimen: Urine, Random  Result Value Ref Range Status   Specimen Description   Final    URINE, RANDOM Performed at [place] [address], [place], [place] [address]    Special Requests   Final    NONE Performed at [facility], [place] [address] [place], [place] [address]    Culture   Final    NO GROWTH Performed at [facility], [place] [address], [place], [place] [address]    Report Status 03/25/2019 FINAL  Final  Respiratory Panel by RT PCR (Flu A&B, Covid) - Nasopharyngeal Swab     Status: None   Collection Time: 03/26/19 10:17 AM   Specimen: Nasopharyngeal Swab  Result Value Ref Range Status   SARS Coronavirus 2 by RT PCR NEGATIVE NEGATIVE Final    Comment: (NOTE) SARS-CoV-2 target nucleic acids are NOT DETECTED. The SARS-CoV-2 RNA is generally detectable in upper  respiratoy specimens during the acute phase of infection. The lowest concentration of SARS-CoV-2 viral copies this assay can detect is 131 copies/mL. A negative result does not preclude SARS-Cov-2 infection and should not be used as the sole basis for treatment or other patient management decisions. A negative result may occur with  improper specimen collection/handling, submission of specimen other than nasopharyngeal swab, presence of viral mutation(s) within the areas targeted by this assay, and inadequate number of viral copies (<131 copies/mL). A negative result must be combined with clinical observations, patient history, and epidemiological information. The expected result is Negative. Fact Sheet for Patients:  PinkCheek.be Fact Sheet for Healthcare Providers:  GravelBags.it This test is not yet ap proved or cleared by the [place] FDA and  has been authorized for detection and/or diagnosis of SARS-CoV-2 by FDA under an Emergency Use Authorization (EUA). This EUA will remain  in effect (meaning this test can be used) for the duration of the COVID-19 declaration under Section 564(b)(1) of the Act, 21 U.S.C. section 360bbb-3(b)(1), unless the authorization is terminated or revoked sooner.    Influenza A by PCR NEGATIVE NEGATIVE Final   Influenza B by PCR NEGATIVE NEGATIVE Final    Comment: (NOTE) The Xpert Xpress SARS-CoV-2/FLU/RSV assay is intended as an aid in  the diagnosis of influenza from Nasopharyngeal swab specimens and  should not be used as a sole basis for treatment. Nasal washings and  aspirates are unacceptable for Xpert Xpress SARS-CoV-2/FLU/RSV  testing. Fact Sheet for Patients: PinkCheek.be Fact Sheet for Healthcare Providers: GravelBags.it This test is not yet approved or cleared by the [place] FDA and  has been authorized for  detection and/or diagnosis of SARS-CoV-2 by  FDA under an Emergency Use Authorization (EUA). This EUA will remain  in effect (meaning this test can be used) for the duration of the  Covid-19 declaration under Section 564(b)(1) of the Act, 21  U.S.C. section 360bbb-3(b)(1), unless the authorization is  terminated or revoked. Performed at [facility], [place] [address] [place], [place] [address]   Urine Culture     Status: None   Collection Time: 03/26/19  3:02 PM   Specimen: Urine, Clean Catch  Result Value Ref Range Status   Specimen Description   Final    URINE, CLEAN CATCH Performed at [facility], [place] [address], [place],  [address]    Special Requests   Final    NONE Performed at [facility], [address] [place], [place] [address]    Culture   Final    NO GROWTH Performed at [facility], [place] [address], [place], [place] [address]    Report Status 03/27/2019 FINAL  Final      Studies: CT CHEST WO CONTRAST  Result Date: 03/26/2019 CLINICAL DATA:  Evaluate right middle lobe lung nodule. EXAM: CT CHEST WITHOUT CONTRAST TECHNIQUE: Multidetector CT imaging of the chest was performed following the standard protocol without IV contrast. COMPARISON:  None. FINDINGS: Cardiovascular: There is a dual lead AICD. Marked severity calcification of the thoracic aorta is seen. Normal heart size. No pericardial effusion. There is marked severity coronary artery calcification. Mediastinum/Nodes: No enlarged mediastinal or axillary lymph nodes. Thyroid gland, trachea, and esophagus demonstrate no significant findings. Lungs/Pleura: A 3 mm noncalcified lung nodule is seen within the anterolateral aspect of the right lower lobe (axial CT image 90, CT series number 7). A 1.7 cm x 1.4 cm lobulated noncalcified lung nodule is seen within the inferior aspect of the right middle lobe. Very mild areas of focal scarring and/or  atelectasis are seen within the posteromedial aspect of the left lung base and anterolateral aspect of the left upper lobe. There is no evidence of a pleural effusion or pneumothorax. Upper Abdomen: There is a large gastric hernia. There is an 8 mm low-attenuation right adrenal mass. A similar appearing 1.0 cm low-attenuation left adrenal mass is seen (axial CT image 123, CT series number 2). A 3.0 cm cyst is seen within the anterior aspect of the upper pole of the left kidney. Musculoskeletal: Multiple sternal wires are seen. Multilevel degenerative changes seen throughout the thoracic spine. IMPRESSION: 1. 1.7 cm x 1.4 cm lobulated noncalcified right middle lobe lung nodule. Further correlation with a nuclear medicine PET scan is recommended, as an underlying neoplastic process cannot be excluded. 2. Large gastric hernia. 3. Small bilateral adrenal masses which may represent adrenal adenomas. 4. 3.0 cm left renal cyst. Electronically Signed   By: Virgina Norfolk M.D.   On: 03/26/2019 17:43    Scheduled Meds: . cholecalciferol  1,000 Units Oral Daily  . furosemide  40 mg Oral Daily  . memantine  10 mg Oral Daily  . metoprolol tartrate  12.5 mg Oral BID  . potassium chloride SA  20 mEq Oral Daily    Continuous Infusions: . dextrose 5 % and 0.9% NaCl       LOS: 1 day     Kayleen Memos, MD [facility] Pager [phone]  If 7PM-7AM, please contact night-coverage [email or web address] Password [email or web address] 03/27/2019, 2:40 PM

## 2019-03-28 DIAGNOSIS — Z515 Encounter for palliative care: Secondary | ICD-10-CM

## 2019-03-28 DIAGNOSIS — R911 Solitary pulmonary nodule: Secondary | ICD-10-CM

## 2019-03-28 DIAGNOSIS — Z7189 Other specified counseling: Secondary | ICD-10-CM

## 2019-03-28 DIAGNOSIS — I10 Essential (primary) hypertension: Secondary | ICD-10-CM

## 2019-03-28 DIAGNOSIS — Z66 Do not resuscitate: Secondary | ICD-10-CM

## 2019-03-28 DIAGNOSIS — W19XXXA Unspecified fall, initial encounter: Secondary | ICD-10-CM

## 2019-03-28 DIAGNOSIS — I5032 Chronic diastolic (congestive) heart failure: Secondary | ICD-10-CM

## 2019-03-28 DIAGNOSIS — R918 Other nonspecific abnormal finding of lung field: Secondary | ICD-10-CM

## 2019-03-28 LAB — COMPREHENSIVE METABOLIC PANEL
ALT: 16 U/L (ref 0–44)
AST: 23 U/L (ref 15–41)
Albumin: 4.1 g/dL (ref 3.5–5.0)
Alkaline Phosphatase: 61 U/L (ref 38–126)
Anion gap: 14 (ref 5–15)
BUN: 34 mg/dL — ABNORMAL HIGH (ref 8–23)
CO2: 24 mmol/L (ref 22–32)
Calcium: 10.1 mg/dL (ref 8.9–10.3)
Chloride: 102 mmol/L (ref 98–111)
Creatinine, Ser: 1.05 mg/dL — ABNORMAL HIGH (ref 0.44–1.00)
GFR calc Af Amer: 53 mL/min — ABNORMAL LOW (ref >60)
GFR calc non Af Amer: 46 mL/min — ABNORMAL LOW (ref >60)
Glucose, Bld: 146 mg/dL — ABNORMAL HIGH (ref 70–99)
Potassium: 4 mmol/L (ref 3.5–5.1)
Sodium: 140 mmol/L (ref 135–145)
Total Bilirubin: 1.1 mg/dL (ref 0.3–1.2)
Total Protein: 7.4 g/dL (ref 6.5–8.1)

## 2019-03-28 LAB — CBC WITH DIFFERENTIAL/PLATELET
Abs Immature Granulocytes: 0.07 10*3/uL (ref 0.00–0.07)
Basophils Absolute: 0.1 10*3/uL (ref 0.0–0.1)
Basophils Relative: 0 %
Eosinophils Absolute: 0 10*3/uL (ref 0.0–0.5)
Eosinophils Relative: 0 %
HCT: 51.2 % — ABNORMAL HIGH (ref 36.0–46.0)
Hemoglobin: 15.9 g/dL — ABNORMAL HIGH (ref 12.0–15.0)
Immature Granulocytes: 0 %
Lymphocytes Relative: 9 %
Lymphs Abs: 1.7 10*3/uL (ref 0.7–4.0)
MCH: 26.8 pg (ref 26.0–34.0)
MCHC: 31.1 g/dL (ref 30.0–36.0)
MCV: 86.2 fL (ref 80.0–100.0)
Monocytes Absolute: 1.2 10*3/uL — ABNORMAL HIGH (ref 0.1–1.0)
Monocytes Relative: 7 %
Neutro Abs: 15.4 10*3/uL — ABNORMAL HIGH (ref 1.7–7.7)
Neutrophils Relative %: 84 %
Platelets: 279 10*3/uL (ref 150–400)
RBC: 5.94 MIL/uL — ABNORMAL HIGH (ref 3.87–5.11)
RDW: 13.9 % (ref 11.5–15.5)
WBC: 18.4 10*3/uL — ABNORMAL HIGH (ref 4.0–10.5)
nRBC: 0 % (ref 0.0–0.2)

## 2019-03-28 LAB — PROCALCITONIN: Procalcitonin: <0.1 ng/mL

## 2019-03-28 MED ORDER — SODIUM CHLORIDE 0.9 % IV SOLN: INTRAVENOUS | Status: DC

## 2019-03-28 NOTE — Consult Note (Signed)
Consultation Note Date: 03/28/2019   Patient Name: Cheryl Zamora  DOB: [birth year]  MRN: [id number]  Age / Sex: 86 y.o., female   PCP: Burnard Bunting, MD Referring Physician: Nita Sells, MD   REASON FOR CONSULTATION:Establishing goals of care  Palliative Care consult requested for goals of care discussion in this 86 y.o. female with multiple medical problems including permanent atrial fibrillation, dementia, SSS s/p PPM, chronic combined congestive heart failure, CAD s/p CABG (2012), GERD, hypertension, and hyperlipidemia. She presented to ED after experiencing multiple falls in the home. Patient recently seen in ED on 03/21/18 s/p fall with right fibular fracture. She was discharged back home afterwards. While holding in the ED CT renal showed a large staghorn calculus within the left renal pelvis measuring 2.5 cm with mild/moderate hydronephrosis.  Also noted incidental spiculated nodule right middle lobe measuring 2.3 cm which was not present in 2016 which is worrisome for primary bronchogenic carcinoma. Since admission repeat CT showed lobulated noncalcified right middle lobe lung nodule, large gastric hernia, and small bilateral adrenal masses which may represent adenomas.   Clinical Assessment and Goals of Care: I have reviewed medical records including lab results, imaging, Epic notes, and MAR, received report from the bedside RN, and assessed the patient. I met at the bedside with patient's HCPOA and nephew Rudi Heap to discuss diagnosis prognosis, [place], EOL wishes, disposition and options.   Ms. Dragon is awake. She is alert to self only. Pleasantly confused. She is not able to identify her nephew and continues to ask him who he is.   I introduced Palliative Medicine as specialized medical care for people living with serious illness. It focuses on providing relief from the symptoms and stress of a serious illness. The goal is to improve quality of life for both the patient  and the family. Phil verbalized understanding and appreciation.   We discussed a brief life review of the patient, along with her functional and nutritional status. Nephew reports prior to admission patient was living alone in her own home. He reports he did not live far and would spend most of the day with patient until she was ready for bed. He reports patient retired from [employer] at the age of 72. She did not have any children.   Phil reports 10 days prior to admission patient was completely independent and was ambulatory with a walker. She was able to perform all ADLs and he would prepare all meals. He reports she was alert and oriented x3 with no confusion or cognitive concerns. He reports she could engage in appropriate conversation and tell you more about herself than he could. He reports she did begin to experience falls however she was still highly functioning. He reports she was a late riser and would often sleep until noon and then start her day. She loves reading.   We discussed Her current illness and what it means in the larger context of Her on-going co-morbidities.  Natural disease trajectory and expectations at EOL were discussed.  [name] shares detailed discussion with attending earlier today. He verbalizes full awareness of patient's current illness and co-morbidities. He expresses plans to continue taking it one day at a time with watchful waiting. He verbalizes awareness of possible pacemaker lead infection. He reports he would consider removal if this is found and is the only and final option. He refers back to patient's decent quality of life 10 days ago and comparing the differences today.   He reports  his goal is to allow patient a fighting chance and not limit his or her options given her age or co-morbidities. He understands lung nodule however if this is not posing a major problem with patient he prefers to focus on the areas that may be causing her decline.   I attempted  to elicit values and goals of care important to the patient.    The difference between aggressive medical intervention and comfort care was considered in light of the patient's goals of care. Phil verbalizes wishes to continue with aggressive medical interventions. He is hopeful patient will go to SNF for rehabilitation and show some improvement. We discussed both best case and worst case scenario. He again expressed he is hopeful for the best but will also prepare somewhat for the worst (patient further declines or shows no signs of improvement after attempts). He would not want her to suffer.   [name] states he is not prepared to make any final decisions as he wishes to take it day by day and make decisions as they are needed.   Advanced directives, concepts specific to code status, artifical feeding and hydration, and rehospitalization were considered and discussed. Patient does have a documented advanced directive. Advanced directive reviewed. [name] also confirms DNR/DNI. Document on file identifies Rudi Heap as patient's HCPOA. Patient's expressed wishes in the document states she wishes for her "life not to be prolonged by life-sustaining procedures if I am terminally ill, permanently in a coma, suffer severe dementia, or am in a persistent vegetative state" also indicating her wishes are not to be overrode.   Hospice and Palliative Care services outpatient were explained and offered. Patient and family verbalized their understanding and awareness of both palliative and hospice's goals and philosophy of care. [name] is open to outpatient palliative following patient at discharge. He again remains hopeful and requesting continued medical care. He is aware at any time he wishes to transition care to a more hospice/comfort approach he may do so by communicating with the medical team.   Questions and concerns were addressed.  [name] was encouraged to call with questions or concerns.  PMT will continue to  support holistically.   SOCIAL HISTORY:     reports that she has quit smoking. Her smoking use included cigarettes. She has a 60.00 pack-year smoking history. She has never used smokeless tobacco. She reports current alcohol use. She reports that she does not use drugs.  CODE STATUS: DNR  ADVANCE DIRECTIVES: Rudi Heap (Nephew/HCPOA)   SYMPTOM MANAGEMENT: per attending   Palliative Prophylaxis:   Aspiration, Bowel Regimen, Delirium Protocol, Eye Care, Frequent Pain Assessment, Oral Care and Turn Reposition  PSYCHO-SOCIAL/SPIRITUAL:  Support System: Family  Desire for further Chaplaincy support: NO   Additional Recommendations (Limitations, Scope, Preferences):  Full Scope Treatment, DNR/DNI    PAST MEDICAL HISTORY: Past Medical History:  Diagnosis Date  . Atrial fibrillation with rapid ventricular response ([place]) 11/2012; 10/2013   a. s/p TEE/DCCV 11/2012 & 10/2013 b. amiodarone caused significant QT prlongation (>617mec)  . Chronic combined systolic and diastolic CHF (congestive heart failure) ([place]   . Coronary artery disease 2005   a. s/p CABG (2012)  . GERD (gastroesophageal reflux disease)   . Hyperlipidemia    pt denies this hx on 01/03/2015  . Hypertension   . Neuropathy   . Osteoarthritis of back     PAST SURGICAL HISTORY:  Past Surgical History:  Procedure Laterality Date  . ABDOMINAL HYSTERECTOMY    . AV NODE ABLATION  N/A 11/16/2016   Procedure: AV Node Ablation;  Surgeon: Constance Haw, MD;  Location: [facility] [facility];  Service: Cardiovascular;  Laterality: N/A;  . BACK SURGERY    . CARDIAC CATHETERIZATION  04/12/2003   [place] 60, LAD 90, D1 30, CFX 95->T, RCA OK  . CARDIOVERSION N/A 12/13/2012   Procedure: CARDIOVERSION;  Surgeon: Larey Dresser, MD;  [facility]: [facility];  Service: Cardiovascular;  Laterality: N/A;  . CARDIOVERSION N/A 11/13/2013   Procedure: CARDIOVERSION;  Surgeon: Natayla Spark, MD;  Location: [facility] ENDOSCOPY;  Service:  Cardiovascular;  Laterality: N/A;  . CARDIOVERSION N/A 04/12/2014   Procedure: CARDIOVERSION;  Surgeon: Thayer Headings, MD;  Location: [facility] ENDOSCOPY;  Service: Cardiovascular;  Laterality: N/A;  . CARDIOVERSION N/A 01/06/2015   Procedure: CARDIOVERSION;  Surgeon: Larey Dresser, MD;  Location: [place];  Service: Cardiovascular;  Laterality: N/A;  . CARDIOVERSION N/A 11/11/2016   Procedure: CARDIOVERSION;  Surgeon: Josue Hector, MD;  Location: [facility] ENDOSCOPY;  Service: Cardiovascular;  Laterality: N/A;  . [place]  1960's   "ruptured disc'  . CORONARY ANGIOPLASTY    . CORONARY ARTERY BYPASS GRAFT  04/18/2003   x 4; LIMA-LAD, SVG-D1, SVG-OM1-OM2  . PACEMAKER IMPLANT N/A 11/16/2016   Procedure: Pacemaker Implant;  Surgeon: Constance Haw, MD;  Location: [place] [facility];  Service: Cardiovascular;  Laterality: N/A;  . TEE WITHOUT CARDIOVERSION N/A 12/13/2012   Procedure: TRANSESOPHAGEAL ECHOCARDIOGRAM (TEE);  Surgeon: Larey Dresser, MD;  Location: [facility] END[facility]COPY;  Service: Cardiovascular;  Laterality: N/A;  . TEE WITHOUT CARDIOVERSION N/A 11/13/2013   Procedure: TRANSESOPHAGEAL ECHOCARDIOGRAM (TEE);  Surgeon: Thora Spark, MD;  Location: [place];  Service: Cardiovascular;  Laterality: N/A;  . TEE WITHOUT CARDIOVERSION N/A 11/11/2016   Procedure: TRANSESOPHAGEAL ECHOCARDIOGRAM (TEE);  Surgeon: Josue Hector, MD;  Location: [facility] ENDOSCOPY;  Service: Cardiovascular;  Laterality: N/A;  . TONSILLECTOMY      ALLERGIES:  is allergic to digoxin and related; amiodarone; codeine; and penicillins.   MEDICATIONS:  Current Facility-Administered Medications  Medication Dose Route Frequency Provider Last Rate Last Admin  . acetaminophen (TYLENOL) tablet 650 mg  650 mg Oral Q6H PRN [place] Donnamarie Poag, DO      . cholecalciferol (VITAMIN D) tablet 1,000 Units  1,000 Units Oral Daily [place] Eric J, DO   1,000 Units at 03/28/19 1006  . dextrose 5 %-0.9 % sodium chloride infusion    Intravenous Continuous Nita Sells, MD 75 mL/hr at 03/28/19 1053 New Bag at 03/28/19 1053  . memantine (NAMENDA) tablet 10 mg  10 mg Oral Daily [place] Donnamarie Poag, DO   10 mg at 03/28/19 1006  . metoprolol tartrate (LOPRESSOR) tablet 12.5 mg  12.5 mg Oral BID [place] Eric J, DO   12.5 mg at 03/28/19 1006  . ondansetron (ZOFRAN) tablet 4 mg  4 mg Oral Q6H PRN [place] Donnamarie Poag, DO       Or  . ondansetron [facility]) injection 4 mg  4 mg Intravenous Q6H PRN [place] Eric J, DO      . polyethylene glycol (MIRALAX / GLYCOLAX) packet 17 g  17 g Oral Daily PRN [place] Eric J, DO        VITAL SIGNS: BP (!) 119/59 (BP Location: Left Arm)   Pulse 70   Temp 98.2 F (36.8 C) (Oral)   Resp 17   Ht _0  (1.6 m)   Wt 67.1 kg   SpO2 96%   BMI 26.20 kg/m  Filed  Weights   03/24/19 1332 03/26/19 1736 03/27/19 0132  Weight: 65.8 kg 67.1 kg 67.1 kg    Estimated body mass index is 26.2 kg/m as calculated from the following:   Height as of this encounter: _0  (1.6 m).   Weight as of this encounter: 67.1 kg.  LABS: CBC:    Component Value Date/Time   WBC 18.4 (H) 03/28/2019 0352   HGB 15.9 (H) 03/28/2019 0352   HGB 14.7 04/08/2017 1319   HCT 51.2 (H) 03/28/2019 0352   HCT 43.7 04/08/2017 1319   PLT 279 03/28/2019 0352   PLT 254 04/08/2017 1319   Comprehensive Metabolic Panel:    Component Value Date/Time   NA 140 03/28/2019 0352   NA 144 04/08/2017 1319   K 4.0 03/28/2019 0352   CO2 24 03/28/2019 0352   BUN 34 (H) 03/28/2019 0352   BUN 12 04/08/2017 1319   CREATININE 1.05 (H) 03/28/2019 0352   ALBUMIN 4.1 03/28/2019 0352     Review of Systems  Unable to perform ROS: Mental status change   Physical Exam General: NAD, pleasantly confused, frail appearing  Cardiovascular: regular rate and rhythm Pulmonary: clear ant fields Abdomen: soft, nontender, + bowel sounds Extremities: no edema, no joint deformities, right foot boot Neurological: awake, alert to self only, will  follow some commands   Prognosis: Guarded to Poor   Discharge Planning:  [place] for rehab with Palliative care service follow-up  Recommendations:  DNR/DNI-as confirmed by [name] (nephew/POA)   Continue with current plan of care per medical team  Nephew requesting watchful waiting. Remains hopeful for some improvement. Open to medical interventions but would like to make decisions on an as needed basis.   Hopeful for SNF w/rehab and palliative support (referral placed)  PMT will continue to support an follow as needed   Palliative Performance Scale: PPS 30%               Phil (nephew/POA) expressed understanding and was in agreement with this plan.   Thank you for allowing the Palliative Medicine Team to assist in the care of this patient.  Time In: 1140 Time Out: 1235 Time Total: 55 min.   Visit consisted of counseling and education dealing with the complex and emotionally intense issues of symptom management and palliative care in the setting of serious and potentially life-threatening illness.Greater than 50%  of this time was spent counseling and coordinating care related to the above assessment and plan.  Signed by:  Alda Lea, AGPCNP-BC Palliative Medicine Team  Phone: [phone] Fax: [phone] Pager: [phone] Amion: Bjorn Pippin

## 2019-03-28 NOTE — Progress Notes (Signed)
PROGRESS NOTE  Cheryl Zamora QBH:[id number] DOB: 02-23-1924 DOA: 03/23/2019 PCP: Burnard Bunting, MD  HPI/Recap of past 24 hours: 86 y.o. wf permanent atrial fibrillation, dementia, SSS s/p PPM, chronic diastolic congestive heart failure who initially presented to the ED on 03/22/2019 with multiple falls at home; diagnosed with right fibular fracture and discharged home.   Represented to the ED on 03/24/2019 with further multiple falls and nephew states unable to care for at home; and since has been holding for several days in the ED for placement.  pleasantly confused, unable to adequately participate in the HPI.   Today nursing noted that she had some blood in her urine   ED physician ordered CT renal stone study that showed a large staghorn calculus within the left renal pelvis measuring 2.5 cm with mild/moderate hydronephrosis.  Also noted incidental spiculated nodule right middle lobe measuring 2.3 cm which was not present in 2016 which is worrisome for primary bronchogenic carcinoma.    Patient also completed a course of oral Keflex for concern of UTI, in which initial urine culture had no growth.  Assessment/Plan: Principal Problem:   Hydronephrosis of left kidney Active Problems:   PAF (paroxysmal atrial fibrillation) (HCC)   Chronic diastolic CHF (congestive heart failure), NYHA class 3 (HCC)   Essential hypertension   Long term current use of anticoagulant therapy   CKD (chronic kidney disease) stage 3, GFR 30-59 ml/min   CAD, multiple vessel, hx CABG 2012   Hematuria   Staghorn renal calculus   Nodule of middle lobe of right lung   Chronic gross hematuria in the setting of left kidney staghorn calculus with mild to moderate hydronephrosis Seen by urology>> chronic hematuria since at least 2015 on the UAs in epic. The stone has been present and growing since at least 2016. Per Urology with her underlying comorbidities, conservative management could be considered with  intervention only if her pain worsened or the hematuria became more problematic. Continue to hold off Xarelto Continue IV fluid hydration Hemoglobin stable at 15.6K  Leukocytosis, likely reactive but need to consider if spikes a fever possible pacemaker site/lead infection Repeat labs in the morning If spikes fever order placed for cultures x2 I have had a long discussion with her relative as documented below  CKD 3B Appears to be at her baseline creatinine of 1.2 with GFR of 37. Increase D5/NS 75 cc/H Repeat BMP in the morning.  Right middle lobe nodule Incidental finding of a spiculated nodule right middle lobe measuring 2.3 cm concerning for possible primary bronchogenic carcinoma. --CT chest without contrast for further evaluation 03/26/19: 1. 1.7 cm x 1.4 cm lobulated noncalcified right middle lobe lung nodule. Further correlation with a nuclear medicine PET scan is recommended, as an underlying neoplastic process cannot be excluded. 2. Large gastric hernia. 3. Small bilateral adrenal masses which may represent adrenal adenomas. 4. 3.0 cm left renal cyst. -Would likely not benefit from aggressive measures such as chemotherapy, further intervention given her dementia, advanced age.   Palliative care team to consolidate goals of care if they are available  Essential hypertension Hx CAD Permanent atrial fibrillation Hx SSS s/p PPM --Continue metoprolol tartrate 12.5 mg p.o. twice daily --Hold off p.o. Lasix and start gentle IV fluid hydration x1 day. --Holding home Xarelto as above, given her frequent falls-long discussion as per below --Continue strict I's and O's and daily weight  Frequent falls --PT assessment recommended SNF. --TOC for SNF placement    DVT prophylaxis:  SCDs.  Code Status: DNR - verified with patients nephew/HCPOA Phil-I had a long discussion with the patient's nephew on 1/27, patient has been declining for the past 5 years-we discussed that  lab work not contributory for infection and kidney and her lungs are clear however my concern is she may have a pacemaker lead infection which may require work-up if she is continues to spike a fever-this may ultimately require 6 weeks of antibiotics as he is not inclined to remove the pacemaker wire We also discussed discontinuation of a blood thinner with the risk of stroke being present if this happens He is aware of this risk Patient herself is frail in her 90s and has had multiple falls which adds onto her frailty-it would probably be advisable to have her placed at a skilled facility but if she declines then have a palliative discussion  Family Communication: None at bedside.    Disposition Plan:  [name] from home.  Unclear at this time await improvement of creatinine and if she spikes a fever will need discussion about work-up  Consults called: Urology - Dr Jeffie Pollock by EDP; palliative care consult   Objective: Vitals:   03/27/19 0701 03/27/19 1327 03/27/19 2004 03/28/19 0552  BP: 98/72 (!) 159/84 (!) 153/98 (!) 143/75  Pulse: 74 73 95 88  Resp: 15 18 18 16   Temp: 97.6 F (36.4 C) (!) 97.5 F (36.4 C) 99 F (37.2 C) 98.9 F (37.2 C)  TempSrc: Oral Axillary Oral Axillary  SpO2: 94% 99% 92% 94%  Weight:      Height:        Intake/Output Summary (Last 24 hours) at 03/28/2019 1005 Last data filed at 03/28/2019 0300 Gross per 24 hour  Intake 671.54 ml  Output 200 ml  Net 471.54 ml   Filed Weights   03/24/19 1332 03/26/19 1736 03/27/19 0132  Weight: 65.8 kg 67.1 kg 67.1 kg    Exam:  . Awake coherent to self but otherwise quite confused . Seems somewhat hard of hearing . Frail . S1-S2 seems to be in sinus rhythm . Patient has a boot on the right lower extremity . Abdomen is soft no rebound although mild tenderness with deep palpation abdomen is pendulous . Chest is clinically clear no added sound . No JVD and bruit . Neurologic able to move all 4 limbs   Data  Reviewed: CBC: Recent Labs  Lab 03/24/19 1253 03/26/19 1559 03/27/19 0431 03/28/19 0352  WBC 11.6* 15.3* 14.3* 18.4*  NEUTROABS  --   --   --  15.4*  HGB 14.1 15.7* 15.6* 15.9*  HCT 44.3 50.3* 50.8* 51.2*  MCV 86.4 87.0 86.2 86.2  PLT 242 288 284 409   Basic Metabolic Panel: Recent Labs  Lab 03/24/19 1253 03/26/19 1559 03/27/19 0431 03/28/19 0352  NA 138 139 139 140  K 3.4* 4.0 4.8 4.0  CL 101 99 100 102  CO2 26 30 28 24   GLUCOSE 109* 112* 140* 146*  BUN 17 30* 34* 34*  CREATININE 1.02* 1.45* 1.26* 1.05*  CALCIUM 9.5 10.0 10.2 10.1  MG  --  2.3  --   --    GFR: Estimated Creatinine Clearance: 30.8 mL/min (A) (by C-G formula based on SCr of 1.05 mg/dL (H)). Liver Function Tests: Recent Labs  Lab 03/26/19 1559 03/28/19 0352  AST 20 23  ALT 13 16  ALKPHOS 58 61  BILITOT 1.1 1.1  PROT 7.5 7.4  ALBUMIN 4.1 4.1   No results for input(s): LIPASE, AMYLASE  in the last 168 hours. No results for input(s): AMMONIA in the last 168 hours. Coagulation Profile: Recent Labs  Lab 03/26/19 1559  INR 1.9*   Cardiac Enzymes: No results for input(s): CKTOTAL, CKMB, CKMBINDEX, TROPONINI in the last 168 hours. BNP (last 3 results) No results for input(s): PROBNP in the last 8760 hours. HbA1C: No results for input(s): HGBA1C in the last 72 hours. CBG: No results for input(s): GLUCAP in the last 168 hours. Lipid Profile: No results for input(s): CHOL, HDL, LDLCALC, TRIG, CHOLHDL, LDLDIRECT in the last 72 hours. Thyroid Function Tests: Recent Labs    03/26/19 1559  TSH 1.427   Anemia Panel: No results for input(s): VITAMINB12, FOLATE, FERRITIN, TIBC, IRON, RETICCTPCT in the last 72 hours. Urine analysis:    Component Value Date/Time   COLORURINE RED (A) 03/26/2019 1503   APPEARANCEUR CLOUDY (A) 03/26/2019 1503   LABSPEC 1.015 03/26/2019 1503   PHURINE 6.0 03/26/2019 1503   GLUCOSEU NEGATIVE 03/26/2019 1503   HGBUR MODERATE (A) 03/26/2019 1503   BILIRUBINUR  NEGATIVE 03/26/2019 1503   KETONESUR NEGATIVE 03/26/2019 1503   PROTEINUR 100 (A) 03/26/2019 1503   UROBILINOGEN 0.2 01/13/2015 0058   NITRITE NEGATIVE 03/26/2019 1503   LEUKOCYTESUR NEGATIVE 03/26/2019 1503   Sepsis Labs: @LABRCNTIP (procalcitonin:4,lacticidven:4)  ) Recent Results (from the past 240 hour(s))  Respiratory Panel by RT PCR (Flu A&B, Covid) -     Status: None   Collection Time: 03/23/19  4:19 PM  Result Value Ref Range Status   SARS Coronavirus 2 by RT PCR NEGATIVE NEGATIVE Final    Comment: (NOTE) SARS-CoV-2 target nucleic acids are NOT DETECTED. The SARS-CoV-2 RNA is generally detectable in upper respiratoy specimens during the acute phase of infection. The lowest concentration of SARS-CoV-2 viral copies this assay can detect is 131 copies/mL. A negative result does not preclude SARS-Cov-2 infection and should not be used as the sole basis for treatment or other patient management decisions. A negative result may occur with  improper specimen collection/handling, submission of specimen other than nasopharyngeal swab, presence of viral mutation(s) within the areas targeted by this assay, and inadequate number of viral copies (<131 copies/mL). A negative result must be combined with clinical observations, patient history, and epidemiological information. The expected result is Negative. Fact Sheet for Patients:  PinkCheek.be Fact Sheet for Healthcare Providers:  GravelBags.it This test is not yet ap proved or cleared by the [place] FDA and  has been authorized for detection and/or diagnosis of SARS-CoV-2 by FDA under an Emergency Use Authorization (EUA). This EUA will remain  in effect (meaning this test can be used) for the duration of the COVID-19 declaration under Section 564(b)(1) of the Act, 21 U.S.C. section 360bbb-3(b)(1), unless the authorization is terminated or revoked sooner.    Influenza  A by PCR NEGATIVE NEGATIVE Final   Influenza B by PCR NEGATIVE NEGATIVE Final    Comment: (NOTE) The Xpert Xpress SARS-CoV-2/FLU/RSV assay is intended as an aid in  the diagnosis of influenza from Nasopharyngeal swab specimens and  should not be used as a sole basis for treatment. Nasal washings and  aspirates are unacceptable for Xpert Xpress SARS-CoV-2/FLU/RSV  testing. Fact Sheet for Patients: PinkCheek.be Fact Sheet for Healthcare Providers: GravelBags.it This test is not yet approved or cleared by the [place] FDA and  has been authorized for detection and/or diagnosis of SARS-CoV-2 by  FDA under an Emergency Use Authorization (EUA). This EUA will remain  in effect (meaning this test can be used)  for the duration of the  Covid-19 declaration under Section 564(b)(1) of the Act, 21  U.S.C. section 360bbb-3(b)(1), unless the authorization is  terminated or revoked. Performed at [facility], [place] [address] [place], [place] [address]   Urine Culture     Status: None   Collection Time: 03/24/19  1:48 PM   Specimen: Urine, Random  Result Value Ref Range Status   Specimen Description   Final    URINE, RANDOM Performed at [place] [address], [place], [place] [address]    Special Requests   Final    NONE Performed at [facility], [place] [address], [place], [place] [address]    Culture   Final    NO GROWTH Performed at [facility], [place] [address], [place], [place] [address]    Report Status 03/25/2019 FINAL  Final  Respiratory Panel by RT PCR (Flu A&B, Covid) - Nasopharyngeal Swab     Status: None   Collection Time: 03/26/19 10:17 AM   Specimen: Nasopharyngeal Swab  Result Value Ref Range Status   SARS Coronavirus 2 by RT PCR NEGATIVE NEGATIVE Final    Comment: (NOTE) SARS-CoV-2 target nucleic acids are NOT DETECTED. The SARS-CoV-2 RNA is  generally detectable in upper respiratoy specimens during the acute phase of infection. The lowest concentration of SARS-CoV-2 viral copies this assay can detect is 131 copies/mL. A negative result does not preclude SARS-Cov-2 infection and should not be used as the sole basis for treatment or other patient management decisions. A negative result may occur with  improper specimen collection/handling, submission of specimen other than nasopharyngeal swab, presence of viral mutation(s) within the areas targeted by this assay, and inadequate number of viral copies (<131 copies/mL). A negative result must be combined with clinical observations, patient history, and epidemiological information. The expected result is Negative. Fact Sheet for Patients:  PinkCheek.be Fact Sheet for Healthcare Providers:  GravelBags.it This test is not yet ap proved or cleared by the [place] FDA and  has been authorized for detection and/or diagnosis of SARS-CoV-2 by FDA under an Emergency Use Authorization (EUA). This EUA will remain  in effect (meaning this test can be used) for the duration of the COVID-19 declaration under Section 564(b)(1) of the Act, 21 U.S.C. section 360bbb-3(b)(1), unless the authorization is terminated or revoked sooner.    Influenza A by PCR NEGATIVE NEGATIVE Final   Influenza B by PCR NEGATIVE NEGATIVE Final    Comment: (NOTE) The Xpert Xpress SARS-CoV-2/FLU/RSV assay is intended as an aid in  the diagnosis of influenza from Nasopharyngeal swab specimens and  should not be used as a sole basis for treatment. Nasal washings and  aspirates are unacceptable for Xpert Xpress SARS-CoV-2/FLU/RSV  testing. Fact Sheet for Patients: PinkCheek.be Fact Sheet for Healthcare Providers: GravelBags.it This test is not yet approved or cleared by the [place] FDA and    has been authorized for detection and/or diagnosis of SARS-CoV-2 by  FDA under an Emergency Use Authorization (EUA). This EUA will remain  in effect (meaning this test can be used) for the duration of the  Covid-19 declaration under Section 564(b)(1) of the Act, 21  U.S.C. section 360bbb-3(b)(1), unless the authorization is  terminated or revoked. Performed at [facility], [place] [address] [place], [place] [address]   Urine Culture     Status: None   Collection Time: 03/26/19  3:02 PM   Specimen: Urine, Clean Catch  Result  Value Ref Range Status   Specimen Description   Final    URINE, CLEAN CATCH Performed at [facility], [place] [address] [place], [place] [address]    Special Requests   Final    NONE Performed at [facility], [place] [address] [place], [place] [address]    Culture   Final    NO GROWTH Performed at [facility], [place] [address] [place],  [address]    Report Status 03/27/2019 FINAL  Final      Studies: No results found.  Scheduled Meds: . cholecalciferol  1,000 Units Oral Daily  . memantine  10 mg Oral Daily  . metoprolol tartrate  12.5 mg Oral BID    Continuous Infusions: . dextrose 5 % and 0.9% NaCl 50 mL/hr at 03/27/19 1841     LOS: 2 days     Nita Sells, MD [facility] Pager [phone]  If 7PM-7AM, please contact night-coverage [email or web address] Password [facility] 03/28/2019, 10:05 AM

## 2019-03-28 NOTE — Progress Notes (Signed)
Physical Therapy Treatment Patient Details Name: Cheryl Zamora MRN: [id number] DOB: 09-04-1923 Today's Date: 03/28/2019    History of Present Illness Pt is a 86 year old female was admitted with frequent falls (4) and inability to care for herself. PMH of PAfib with DCCV, CAD, CABG, HTN.  Recent fall with minimally displaced oblique fracture of the distal fibula - per orthopedics could be weightbearing as tolerated in cam boot.    PT Comments    Patient making slow progress with mobility. Pt continues to require 2+ assist for bed mobility and transfers. She was limited this session by fatigue and pain in bil LE's. Pt with significant posterior lean in sitting and standing today and unable to perform and steps with RW at EOB. Pt will benefit from skilled PT to increase their independence and safety with mobility to allow discharge to the venue listed below. Acute PT will progress as able.   Follow Up Recommendations  SNF;Supervision/Assistance - 24 hour     Equipment Recommendations  None recommended by PT    Recommendations for Other Services       Precautions / Restrictions Precautions Precautions: Fall Required Braces or Orthoses: Other Brace Restrictions Other Position/Activity Restrictions: WBAT with CAM walker    Mobility  Bed Mobility Overal bed mobility: Needs Assistance Bed Mobility: Supine to Sit;Sit to Supine Rolling: Max assist   Supine to sit: Max assist;+2 for safety/equipment;+2 for physical assistance Sit to supine: +2 for physical assistance;Max assist;+2 for safety/equipment   General bed mobility comments: pt required verbal/tactile cues to initiate Bil LE walking to EOB and reaching with UE's to roll in bed. Max assist +2 required for supine<>sit transfers and pt with significant posterior lean in sitting at EOB requiring max assist to prevent LOB.  Transfers Overall transfer level: Needs assistance Equipment used: Rolling walker (2 wheeled);2 person hand  held assist Transfers: Sit to/from Stand Sit to Stand: +2 physical assistance;From elevated surface;Max assist         General transfer comment: performed sit<>stand x3 wtih RW and x1 wtih 2 HHA. Pt did not complete rise with 2 person HHA due to fear of falling and pt unsafe with extension posture. Pt requried blocking at bil feet/knees to prevent anterior sliding and LOB. Unable pt requried 2+ max assist to scoot bottom backwards in bed due to being too close to EOB. Unable to perform stand pivot to recliner today.  Ambulation/Gait                 Stairs             Wheelchair Mobility    Modified Rankin (Stroke Patients Only)       Balance Overall balance assessment: Needs assistance Sitting-balance support: Bilateral upper extremity supported;Feet supported Sitting balance-Leahy Scale: Fair Sitting balance - Comments: significant posterior lean Postural control: Posterior lean Standing balance support: Bilateral upper extremity supported Standing balance-Leahy Scale: Zero Standing balance comment: significant posterior lean requiring max assist to maintain balance           Cognition Arousal/Alertness: Awake/alert Behavior During Therapy: WFL for tasks assessed/performed Overall Cognitive Status: No family/caregiver present to determine baseline cognitive functioning          General Comments: follows simple commands with increased time.  Easily distracted and going off topic in conversation      Exercises General Exercises - Lower Extremity Hip Flexion/Marching: AROM;5 reps;Both;Seated    General Comments        Pertinent Vitals/Pain Pain  Assessment: Faces Faces Pain Scale: Hurts even more Pain Location: Bil lower leg with mobilizing Pain Descriptors / Indicators: Grimacing;Discomfort Pain Intervention(s): Limited activity within patient's tolerance;Monitored during session           PT Goals (current goals can now be found in the care  plan section) Acute Rehab PT Goals PT Goal Formulation: With patient/family Time For Goal Achievement: 03/30/19 Potential to Achieve Goals: Good Progress towards PT goals: Not progressing toward goals - comment    Frequency    Min 2X/week      PT Plan Current plan remains appropriate       AM-PAC PT "6 Clicks" Mobility   Outcome Measure  Help needed turning from your back to your side while in a flat bed without using bedrails?: A Lot Help needed moving from lying on your back to sitting on the side of a flat bed without using bedrails?: Total Help needed moving to and from a bed to a chair (including a wheelchair)?: Total Help needed standing up from a chair using your arms (e.g., wheelchair or bedside chair)?: Total Help needed to walk in hospital room?: Total Help needed climbing 3-5 steps with a railing? : Total 6 Click Score: 7    End of Session Equipment Utilized During Treatment: Gait belt Activity Tolerance: Patient limited by pain;Patient limited by fatigue Patient left: in bed;with bed alarm set;with call bell/phone within reach Nurse Communication: Mobility status PT Visit Diagnosis: Other abnormalities of gait and mobility (R26.89);History of falling (Z91.81) Pain - Right/Left: Right Pain - part of body: Leg     Time: 1315-1346 PT Time Calculation (min) (ACUTE ONLY): 31 min  Charges:  $Therapeutic Activity: 23-37 mins                     Verner Mould, DPT Physical Therapist with [facility] [phone]  03/28/2019 2:00 PM

## 2019-03-28 NOTE — TOC Progression Note (Signed)
Transition of Care [facility]) - Progression Note    Patient Details  Name: Cheryl Zamora MRN: [id number] Date of Birth: Jul 11, 1923  Transition of [facility]) CM/SW Contact  Abdirahim Flavell, Marjie Skiff, RN Phone Number: 03/28/2019, 10:27 AM  Clinical Narrative:    TOC has been following for snf placement. This CM spoke with nephew [name] this am about more bed offers that have come in for pt. [name] has chosen [facility] as first choice. [facility] as second choice. Auth started with [facility]. [facility] has a bed available tomorrow 1/28. They can accept last negative covid test from 1/25. TOC will continue to follow for medical stability for dc.   Expected Discharge Plan: [place] Barriers to Discharge: No Barriers Identified  Expected Discharge Plan and Services Expected Discharge Plan: [place] In-house Referral: Clinical Social Work Discharge Planning Services: CM Consult Post Acute Care Choice: [place] arrangements for the past 2 months: Single Family Home                                       Social Determinants of Health (SDOH) Interventions    Readmission Risk Interventions No flowsheet data found.

## 2019-03-29 DIAGNOSIS — I10 Essential (primary) hypertension: Secondary | ICD-10-CM | POA: Diagnosis not present

## 2019-03-29 DIAGNOSIS — M6281 Muscle weakness (generalized): Secondary | ICD-10-CM | POA: Diagnosis not present

## 2019-03-29 DIAGNOSIS — Z950 Presence of cardiac pacemaker: Secondary | ICD-10-CM | POA: Diagnosis not present

## 2019-03-29 DIAGNOSIS — N1330 Unspecified hydronephrosis: Secondary | ICD-10-CM | POA: Diagnosis not present

## 2019-03-29 DIAGNOSIS — F0150 Vascular dementia without behavioral disturbance: Secondary | ICD-10-CM | POA: Diagnosis not present

## 2019-03-29 DIAGNOSIS — Z7401 Bed confinement status: Secondary | ICD-10-CM | POA: Diagnosis not present

## 2019-03-29 DIAGNOSIS — M2550 Pain in unspecified joint: Secondary | ICD-10-CM | POA: Diagnosis not present

## 2019-03-29 DIAGNOSIS — R262 Difficulty in walking, not elsewhere classified: Secondary | ICD-10-CM | POA: Diagnosis not present

## 2019-03-29 DIAGNOSIS — E569 Vitamin deficiency, unspecified: Secondary | ICD-10-CM | POA: Diagnosis not present

## 2019-03-29 DIAGNOSIS — R410 Disorientation, unspecified: Secondary | ICD-10-CM | POA: Diagnosis not present

## 2019-03-29 DIAGNOSIS — S82401D Unspecified fracture of shaft of right fibula, subsequent encounter for closed fracture with routine healing: Secondary | ICD-10-CM | POA: Diagnosis not present

## 2019-03-29 LAB — CBC WITH DIFFERENTIAL/PLATELET
Abs Immature Granulocytes: 0.05 10*3/uL (ref 0.00–0.07)
Basophils Absolute: 0.2 10*3/uL — ABNORMAL HIGH (ref 0.0–0.1)
Basophils Relative: 1 %
Eosinophils Absolute: 0.2 10*3/uL (ref 0.0–0.5)
Eosinophils Relative: 2 %
HCT: 44.9 % (ref 36.0–46.0)
Hemoglobin: 14 g/dL (ref 12.0–15.0)
Immature Granulocytes: 0 %
Lymphocytes Relative: 20 %
Lymphs Abs: 2.6 10*3/uL (ref 0.7–4.0)
MCH: 27.3 pg (ref 26.0–34.0)
MCHC: 31.2 g/dL (ref 30.0–36.0)
MCV: 87.5 fL (ref 80.0–100.0)
Monocytes Absolute: 1 10*3/uL (ref 0.1–1.0)
Monocytes Relative: 8 %
Neutro Abs: 8.8 10*3/uL — ABNORMAL HIGH (ref 1.7–7.7)
Neutrophils Relative %: 69 %
Platelets: 248 10*3/uL (ref 150–400)
RBC: 5.13 MIL/uL — ABNORMAL HIGH (ref 3.87–5.11)
RDW: 14 % (ref 11.5–15.5)
WBC: 12.8 10*3/uL — ABNORMAL HIGH (ref 4.0–10.5)
nRBC: 0 % (ref 0.0–0.2)

## 2019-03-29 LAB — BASIC METABOLIC PANEL
Anion gap: 9 (ref 5–15)
BUN: 23 mg/dL (ref 8–23)
CO2: 24 mmol/L (ref 22–32)
Calcium: 9 mg/dL (ref 8.9–10.3)
Chloride: 105 mmol/L (ref 98–111)
Creatinine, Ser: 0.92 mg/dL (ref 0.44–1.00)
GFR calc Af Amer: >60 mL/min (ref >60)
GFR calc non Af Amer: 54 mL/min — ABNORMAL LOW (ref >60)
Glucose, Bld: 105 mg/dL — ABNORMAL HIGH (ref 70–99)
Potassium: 3.4 mmol/L — ABNORMAL LOW (ref 3.5–5.1)
Sodium: 138 mmol/L (ref 135–145)

## 2019-03-29 MED ORDER — METOPROLOL TARTRATE 25 MG PO TABS: 12.5000 mg | ORAL_TABLET | Freq: Two times a day (BID) | ORAL | 0 refills | Status: DC

## 2019-03-29 NOTE — Progress Notes (Signed)
Pt discharged to [facility] via [facility] in stable condition. Report called to Maudie Mercury, RN at facility. No immediate questions or concerns at this time.

## 2019-03-29 NOTE — Discharge Summary (Signed)
Physician Discharge Summary  Cheryl Zamora VVO:[id number] DOB: 11/29/1923 DOA: 03/23/2019  PCP: Burnard Bunting, MD  Admit date: 03/23/2019 Discharge date: 03/29/2019  Time spent: 45 minutes  Recommendations for Outpatient Follow-up:  1. Recommend outpatient coordination of goals of care at facility if patient fails to thrive 2. Recommend CBC Chem-7 1 week 3. Have discontinued anticoagulation given high risk of hematuria and bleeding in this addition to falls and risk benefit discussion has been undertaken with family-please reconsider the same if things change 4. Please coordinate with Dr. Jeffie Pollock of [facility] if there is a feeling or a need that patient may need removal of staghorn calculus-this was discussed in detail with patient and family and family was at the time of discharge not interested in pursuing this 5. Please force fluids up to 1.2 L a day or as able 6. Will need follow-up for fibular fracture with orthopedics Dr. Ninfa Linden who was consulted in the emergency room on 1/21-suggest coordination after arrival at SNF to obtain x-rays and follow-up  Discharge Diagnoses:  Principal Problem:   Hydronephrosis of left kidney Active Problems:   PAF (paroxysmal atrial fibrillation) (HCC)   Chronic diastolic CHF (congestive heart failure), NYHA class 3 ([place])   Essential hypertension   Long term current use of anticoagulant therapy   CKD (chronic kidney disease) stage 3, GFR 30-59 ml/min   CAD, multiple vessel, hx CABG 2012   Hematuria   Staghorn renal calculus   Nodule of middle lobe of right lung   Discharge Condition: Guarded  Diet recommendation: Liberalize to regular diet  Filed Weights   03/24/19 1332 03/26/19 1736 03/27/19 0132  Weight: 65.8 kg 67.1 kg 67.1 kg    History of present illness:  49 WF permanent A. fib chads score >4, dementia, sick sinus syndrome with pacemaker compensated chronic diastolic heart failure admitted with multiple falls and had a fibular  fracture 1/21 was discharged home Represented as nephew could not take care of patient on 1/23 Found to have hematuria eventually in the emergency room worked up and found to have a staghorn calculus and a spiculated lung nodule  Hospital Course:  Chronic gross hematuria Likely secondary to chronic staghorn calculus present since 2016 in setting of blood thinner Xarelto stopped Treated completely with antibiotics although still had persisting leukocytosis Risk-benefit discussion undertaken with nephew by urologist, hospitalist-only conservative management unless more symptomatic painful or he increasing hematuria Chronic atrial fibrillation sick sinus syndrome Stable at this time discontinued Xarelto this admission Continue metoprolol 12.5 twice daily Lasix discontinued this admission Leukocytosis remote possibility of pacemaker lead infection No spiking a fever White count down to 12.8 risk-benefit discussion again undertaken on 1/27 family was not interested in working up for removing lead AKI superimposed on CKD 3B Placed on IV fluids during hospital stay-force fluids as an outpatient RML lung nodule CT chest showed 1.7 x 1.4 cm noncalcified lung nodule and bilateral adrenal masses-family not interested in this work-up Frequent falls with fibular fracture Needs x-rays probably on 04/05/2019 follow-up fibula fracture or as directed per Dr. Ninfa Linden   Procedures:  CT chest  Leg x-rays   Consultations:  Palliative care  urology   Discharge Exam: Vitals:   03/28/19 2231 03/29/19 0543  BP: (!) 137/52 138/64  Pulse: 70 70  Resp: 15 16  Temp: 98.5 F (36.9 C) 97.9 F (36.6 C)  SpO2: 98% 97%    General: Awake alert confused pleasant otherwise Cardiovascular: S1-S2 no murmur rub or gallop Respiratory: Clinically clear,  sternotomy scar, PPM Abdomen soft nontender no rebound no guarding Neurologically intact  Discharge Instructions   Discharge Instructions    Diet -  low sodium heart healthy   Complete by: As directed    Increase activity slowly   Complete by: As directed      Allergies as of 03/29/2019      Reactions   Digoxin And Related Other (See Comments)   Increased HR   Amiodarone Other (See Comments)   Excessive QT prolongation, nausea   Codeine Nausea And Vomiting   Penicillins Nausea Only   Did it involve swelling of the face/tongue/throat, SOB, or low BP? Unknown Did it involve sudden or severe rash/hives, skin peeling, or any reaction on the inside of your mouth or nose? Unknown Did you need to seek medical attention at a hospital or doctor's office? Unknown When did it last happen? If all above answers are "NO", may proceed with cephalosporin use.      Medication List    STOP taking these medications   doxycycline 100 MG tablet Commonly known as: VIBRA-TABS   furosemide 40 MG tablet Commonly known as: LASIX   potassium chloride SA 20 MEQ tablet Commonly known as: KLOR-CON   Xarelto 15 MG Tabs tablet Generic drug: Rivaroxaban     TAKE these medications   acetaminophen 325 MG tablet Commonly known as: TYLENOL Take 650 mg by mouth every 6 (six) hours as needed for mild pain.   memantine 10 MG tablet Commonly known as: NAMENDA Take 10 mg by mouth daily.   metoprolol tartrate 25 MG tablet Commonly known as: LOPRESSOR Take 0.5 tablets (12.5 mg total) by mouth 2 (two) times daily.   Vitamin D3 25 MCG (1000 UT) Caps Take 1,000 Units by mouth daily.            Durable Medical Equipment  (From admission, onward)         Start     Ordered   03/27/19 1048  For home use only DME 3 n 1  Once     03/27/19 1047         Allergies  Allergen Reactions  . Digoxin And Related Other (See Comments)    Increased HR  . Amiodarone Other (See Comments)    Excessive QT prolongation, nausea  . Codeine Nausea And Vomiting  . Penicillins Nausea Only    Did it involve swelling of the face/tongue/throat, SOB, or low  BP? Unknown Did it involve sudden or severe rash/hives, skin peeling, or any reaction on the inside of your mouth or nose? Unknown Did you need to seek medical attention at a hospital or doctor's office? Unknown When did it last happen? If all above answers are "NO", may proceed with cephalosporin use.   Contact information for after-discharge care    Destination    [facility] Preferred SNF .   Service: Skilled Nursing Contact information: [address] [place] [phone]               The results of significant diagnostics from this hospitalization (including imaging, microbiology, ancillary and laboratory) are listed below for reference.    Significant Diagnostic Studies: DG Chest 2 View  Result Date: 03/24/2019 CLINICAL DATA:  Altered mental status. Weakness. History of atrial fibrillation, CHF, coronary artery disease, hypertension. Former smoker. EXAM: CHEST - 2 VIEW COMPARISON:  Chest x-rays dated 11/17/2016 and 11/09/2016. FINDINGS: Stable cardiomegaly. Median sternotomy wires appear intact stable in alignment. LEFT chest wall  pacemaker/ICD apparatus appears stable. Lungs are clear. The no pleural effusions seen. Degenerative spondylosis of the kyphotic thoracic spine. No acute or suspicious osseous finding. IMPRESSION: 1. No active disease. No evidence of pneumonia or pulmonary edema. 2. Stable cardiomegaly. Electronically Signed   By: Franki Cabot M.D.   On: 03/24/2019 12:52   DG Ankle Complete Right  Result Date: 03/22/2019 CLINICAL DATA:  Fall 2 days ago with right ankle pain. EXAM: RIGHT ANKLE - COMPLETE 3+ VIEW COMPARISON:  None. FINDINGS: There is diffuse osteopenia. There is a mildly displaced oblique fracture of the distal fibular metaphysis at and above the level of the ankle mortise. Remainder the exam is unremarkable. IMPRESSION: Minimally displaced oblique fracture of the distal fibula. Electronically Signed   By:  Marin Olp M.D.   On: 03/22/2019 08:12   CT Head Wo Contrast  Result Date: 03/23/2019 CLINICAL DATA:  Multiple falls since yesterday. Head trauma. Headache. EXAM: CT HEAD WITHOUT CONTRAST CT CERVICAL SPINE WITHOUT CONTRAST TECHNIQUE: Multidetector CT imaging of the head and cervical spine was performed following the standard protocol without intravenous contrast. Multiplanar CT image reconstructions of the cervical spine were also generated. COMPARISON:  CT head without contrast 03/22/19. FINDINGS: CT HEAD FINDINGS Brain: Moderate atrophy and white matter disease is again seen. No acute infarct, hemorrhage, or mass lesion is present. The ventricles are proportionate to the degree of atrophy. No significant extraaxial fluid collection is present. The brainstem and cerebellum are within normal limits. Vascular: Atherosclerotic changes are present within the cavernous internal carotid arteries bilaterally. Calcifications are also present at the dural margin of the vertebral arteries. There is no significant change. No hyperdense vessel present. Skull: Calvarium is intact. No focal lytic or blastic lesions are present. No significant extracranial soft tissue lesion is present. Sinuses/Orbits: Right maxillary sinus is shrunken compatible with history of disease. No active disease is present. Paranasal sinuses and mastoid air cells are clear. The globes and orbits are within normal limits. IMPRESSION: 1. No acute trauma to the head or cervical spine. 2. Stable atrophy and white matter disease. This likely reflects the sequela of chronic microvascular ischemia. 3. Atherosclerosis. Electronically Signed   By: San Morelle M.D.   On: 03/23/2019 04:59   CT Head Wo Contrast  Result Date: 03/22/2019 CLINICAL DATA:  Recent fall EXAM: CT HEAD WITHOUT CONTRAST TECHNIQUE: Contiguous axial images were obtained from the base of the skull through the vertex without intravenous contrast. COMPARISON:  None. FINDINGS:  Brain: There is moderate diffuse atrophy. There is no intracranial mass, hemorrhage, extra-axial fluid collection, or midline shift. There is small vessel disease throughout the centra semiovale bilaterally. No acute infarct is demonstrable on this study. Vascular: There is no hyperdense vessel. There is calcification in each distal vertebral artery and in the carotid siphon regions bilaterally. Skull: The bony calvarium appears intact. Sinuses/Orbits: There is mucosal thickening in multiple ethmoid air cells. Orbits appear symmetric bilaterally. Other: Mastoid air cells are clear. IMPRESSION: Atrophy with periventricular small vessel disease. No acute infarct. No mass or hemorrhage. There are multiple foci of arterial vascular calcification. There is mucosal thickening in multiple ethmoid air cells. Electronically Signed   By: Lowella Grip III M.D.   On: 03/22/2019 08:20   CT CHEST WO CONTRAST  Result Date: 03/26/2019 CLINICAL DATA:  Evaluate right middle lobe lung nodule. EXAM: CT CHEST WITHOUT CONTRAST TECHNIQUE: Multidetector CT imaging of the chest was performed following the standard protocol without IV contrast. COMPARISON:  None. FINDINGS: Cardiovascular: There  is a dual lead AICD. Marked severity calcification of the thoracic aorta is seen. Normal heart size. No pericardial effusion. There is marked severity coronary artery calcification. Mediastinum/Nodes: No enlarged mediastinal or axillary lymph nodes. Thyroid gland, trachea, and esophagus demonstrate no significant findings. Lungs/Pleura: A 3 mm noncalcified lung nodule is seen within the anterolateral aspect of the right lower lobe (axial CT image 90, CT series number 7). A 1.7 cm x 1.4 cm lobulated noncalcified lung nodule is seen within the inferior aspect of the right middle lobe. Very mild areas of focal scarring and/or atelectasis are seen within the posteromedial aspect of the left lung base and anterolateral aspect of the left upper  lobe. There is no evidence of a pleural effusion or pneumothorax. Upper Abdomen: There is a large gastric hernia. There is an 8 mm low-attenuation right adrenal mass. A similar appearing 1.0 cm low-attenuation left adrenal mass is seen (axial CT image 123, CT series number 2). A 3.0 cm cyst is seen within the anterior aspect of the upper pole of the left kidney. Musculoskeletal: Multiple sternal wires are seen. Multilevel degenerative changes seen throughout the thoracic spine. IMPRESSION: 1. 1.7 cm x 1.4 cm lobulated noncalcified right middle lobe lung nodule. Further correlation with a nuclear medicine PET scan is recommended, as an underlying neoplastic process cannot be excluded. 2. Large gastric hernia. 3. Small bilateral adrenal masses which may represent adrenal adenomas. 4. 3.0 cm left renal cyst. Electronically Signed   By: Virgina Norfolk M.D.   On: 03/26/2019 17:43   CT Cervical Spine Wo Contrast  Result Date: 03/23/2019 CLINICAL DATA:  Pain. Multiple falls since yesterday. EXAM: CT CERVICAL SPINE WITHOUT CONTRAST TECHNIQUE: Multidetector CT imaging of the cervical spine was performed without intravenous contrast. Multiplanar CT image reconstructions were also generated. COMPARISON:  None. FINDINGS: Alignment: Grade 1 degenerative anterolisthesis at C4-5 measures 4 mm. No other significant listhesis is present. There is straightening of the normal cervical lordosis. Skull base and vertebrae: Degenerative changes are present at C1-2. Craniocervical junction is unremarkable. The vertebral body heights are maintained. No acute or healing fractures are present. Soft tissues and spinal canal: No prevertebral fluid or swelling. No visible canal hematoma. Disc levels: Severe right foraminal narrowing is present at C5-6 and C6-7 secondary to uncovertebral and facet disease. Left foraminal stenosis is worse at C5-6 and C6-7. Moderate facet hypertrophy is present at C3-4 and C4-5. Upper chest: The lung apices  are clear. Thoracic inlet is within normal limits. IMPRESSION: 1. No acute fracture or traumatic subluxation. 2. Multilevel degenerative changes of the cervical spine as described. Electronically Signed   By: San Morelle M.D.   On: 03/23/2019 05:02   CT Thoracic Spine Wo Contrast  Result Date: 03/23/2019 CLINICAL DATA:  Multiple falls since yesterday. Back trauma. Pain. EXAM: CT THORACIC SPINE WITHOUT CONTRAST TECHNIQUE: Multidetector CT images of the thoracic were obtained using the standard protocol without intravenous contrast. COMPARISON:  Two-view chest x-ray 03/19/2016 FINDINGS: Alignment: Exaggerated thoracic kyphosis is stable. Vertebrae: Multilevel endplate degenerative changes are present. Vertebral body heights are maintained. No acute or healing fractures are present. Paraspinal and other soft tissues: Visualized ribs are intact. Atherosclerotic calcifications are present in the thoracic aorta. Small hiatal hernia is present. Pacing wires are noted. Is some dependent atelectasis or scarring in the lungs. The lungs are otherwise clear. Disc levels: Facet spurring contributes to foraminal narrowing left greater than right at T11-12 and T10-11. Mild foraminal narrowing is present bilaterally at T2-3 and on  the right at T3-4. IMPRESSION: 1. No acute or healing fractures. 2. Stable exaggerated thoracic kyphosis. 3. Multilevel degenerative changes without acute abnormality. 4. Atherosclerosis. Electronically Signed   By: San Morelle M.D.   On: 03/23/2019 05:07   CT Lumbar Spine Wo Contrast  Result Date: 03/23/2019 CLINICAL DATA:  Back trauma. Multiple falls since yesterday. Pain. EXAM: CT LUMBAR SPINE WITHOUT CONTRAST TECHNIQUE: Multidetector CT imaging of the lumbar spine was performed without intravenous contrast administration. Multiplanar CT image reconstructions were also generated. COMPARISON:  Lumbar spine radiographs 11/29/2007. CT of the abdomen and pelvis 01/02/2015  FINDINGS: Segmentation: 5 non rib-bearing lumbar type vertebral bodies are present. The lowest fully formed vertebral body is L5. Alignment: No significant listhesis is present. Levoconvex curvature is centered at L3-4 Vertebrae: Vertebral body heights are maintained. No acute or healing fractures are present. Visualized sacrum is intact. Paraspinal and other soft tissues: Atherosclerotic changes are noted in the aorta and branch vessels without aneurysm. Left lower pole kidney stoner is increased in size now measuring over 2 cm. Disc levels: Broad-based disc protrusions and endplate spurring contribute to moderate central and bilateral foraminal stenosis at L1-2 greater than L2-3. Moderate to severe central canal stenosis is present at L4-5 greater than L3-4. Foraminal narrowing is greatest the right at L3-4. Moderate to severe central and bilateral foraminal stenosis at L5-S1 is worse on the left. IMPRESSION: 1. No acute fracture or traumatic subluxation. 2. Multilevel spondylosis of the lumbar spine as described. Severe central canal stenosis is greatest at L4-5 and L3-4. 3. Increased size of nonobstructing left lower pole renal stone. Electronically Signed   By: San Morelle M.D.   On: 03/23/2019 05:12   CT Renal Stone Study  Result Date: 03/26/2019 CLINICAL DATA:  Gross hematuria EXAM: CT ABDOMEN AND PELVIS WITHOUT CONTRAST TECHNIQUE: Multidetector CT imaging of the abdomen and pelvis was performed following the standard protocol without IV contrast. COMPARISON:  01/02/2015 FINDINGS: Lower chest: Irregular, spiculated slightly lobulated nodular density within the inferior aspect of the right middle lobe abutting the minor fissure measuring approximately 1.3 x 1.2 by 2.3 cm (series 6, image 12; series 5, image 57). This finding is new from 01/02/2015. Hepatobiliary: No focal liver abnormality is seen. No gallstones, gallbladder wall thickening, or biliary dilatation. Pancreas: Unremarkable. No  pancreatic ductal dilatation or surrounding inflammatory changes. Spleen: Normal in size without focal abnormality. Adrenals/Urinary Tract: Unremarkable adrenal glands. Large lobulated staghorn calculus within the left renal pelvis measuring approximately 2.5 x 2.2 x 2.5 cm with mild-to-moderate left-sided hydronephrosis. There is an additional 0.4 cm left upper pole renal calculus. Left ureter is nondilated. Right kidney and ureter are unremarkable. Urinary bladder is unremarkable. Stomach/Bowel: Small hiatal hernia. Stomach is otherwise within normal limits. Duodenal diverticulum noted. Mild-to-moderate volume of stool throughout the colon. No evidence of bowel wall thickening, distention, or inflammatory changes. Vascular/Lymphatic: Aortic atherosclerosis. No enlarged abdominal or pelvic lymph nodes. Reproductive: Uterus is atrophic or surgically absent. No adnexal masses. Other: No abdominal wall hernia or abnormality. No abdominopelvic ascites. Musculoskeletal: No acute or significant osseous findings. Multilevel degenerative changes of the lumbar spine. IMPRESSION: 1. Large staghorn calculus within the left renal pelvis measuring up to 2.5 cm with mild to moderate left-sided hydronephrosis. 2. Spiculated nodule within the inferior aspect of the right middle lobe abutting the minor fissure measuring up to 2.3 cm. This finding is new from 01/02/2015 and worrisome for primary bronchogenic carcinoma. Electronically Signed   By: Davina Poke D.O.   On: 03/26/2019  12:07    Microbiology: Recent Results (from the past 240 hour(s))  Respiratory Panel by RT PCR (Flu A&B, Covid) -     Status: None   Collection Time: 03/23/19  4:19 PM  Result Value Ref Range Status   SARS Coronavirus 2 by RT PCR NEGATIVE NEGATIVE Final    Comment: (NOTE) SARS-CoV-2 target nucleic acids are NOT DETECTED. The SARS-CoV-2 RNA is generally detectable in upper respiratoy specimens during the acute phase of infection. The  lowest concentration of SARS-CoV-2 viral copies this assay can detect is 131 copies/mL. A negative result does not preclude SARS-Cov-2 infection and should not be used as the sole basis for treatment or other patient management decisions. A negative result may occur with  improper specimen collection/handling, submission of specimen other than nasopharyngeal swab, presence of viral mutation(s) within the areas targeted by this assay, and inadequate number of viral copies (<131 copies/mL). A negative result must be combined with clinical observations, patient history, and epidemiological information. The expected result is Negative. Fact Sheet for Patients:  PinkCheek.be Fact Sheet for Healthcare Providers:  GravelBags.it This test is not yet ap proved or cleared by the [place] FDA and  has been authorized for detection and/or diagnosis of SARS-CoV-2 by FDA under an Emergency Use Authorization (EUA). This EUA will remain  in effect (meaning this test can be used) for the duration of the COVID-19 declaration under Section 564(b)(1) of the Act, 21 U.S.C. section 360bbb-3(b)(1), unless the authorization is terminated or revoked sooner.    Influenza A by PCR NEGATIVE NEGATIVE Final   Influenza B by PCR NEGATIVE NEGATIVE Final    Comment: (NOTE) The Xpert Xpress SARS-CoV-2/FLU/RSV assay is intended as an aid in  the diagnosis of influenza from Nasopharyngeal swab specimens and  should not be used as a sole basis for treatment. Nasal washings and  aspirates are unacceptable for Xpert Xpress SARS-CoV-2/FLU/RSV  testing. Fact Sheet for Patients: PinkCheek.be Fact Sheet for Healthcare Providers: GravelBags.it This test is not yet approved or cleared by the [place] FDA and  has been authorized for detection and/or diagnosis of SARS-CoV-2 by  FDA under an Emergency  Use Authorization (EUA). This EUA will remain  in effect (meaning this test can be used) for the duration of the  Covid-19 declaration under Section 564(b)(1) of the Act, 21  U.S.C. section 360bbb-3(b)(1), unless the authorization is  terminated or revoked. Performed at [facility], [place] [address] [place], [place] [address]   Urine Culture     Status: None   Collection Time: 03/24/19  1:48 PM   Specimen: Urine, Random  Result Value Ref Range Status   Specimen Description   Final    URINE, RANDOM Performed at [facility] [address] [place], [place] [address]    Special Requests   Final    NONE Performed at [facility], [place] [address] [place], [place] [address]    Culture   Final    NO GROWTH Performed at [facility], [place] [address] [place], [place] [address]    Report Status 03/25/2019 FINAL  Final  Respiratory Panel by RT PCR (Flu A&B, Covid) - Nasopharyngeal Swab     Status: None   Collection Time: 03/26/19 10:17 AM   Specimen: Nasopharyngeal Swab  Result Value Ref Range Status   SARS Coronavirus 2 by RT PCR NEGATIVE NEGATIVE Final    Comment: (NOTE) SARS-CoV-2 target nucleic acids are NOT DETECTED. The SARS-CoV-2  RNA is generally detectable in upper respiratoy specimens during the acute phase of infection. The lowest concentration of SARS-CoV-2 viral copies this assay can detect is 131 copies/mL. A negative result does not preclude SARS-Cov-2 infection and should not be used as the sole basis for treatment or other patient management decisions. A negative result may occur with  improper specimen collection/handling, submission of specimen other than nasopharyngeal swab, presence of viral mutation(s) within the areas targeted by this assay, and inadequate number of viral copies (<131 copies/mL). A negative result must be combined with clinical observations, patient history, and epidemiological  information. The expected result is Negative. Fact Sheet for Patients:  PinkCheek.be Fact Sheet for Healthcare Providers:  GravelBags.it This test is not yet ap proved or cleared by the [place] FDA and  has been authorized for detection and/or diagnosis of SARS-CoV-2 by FDA under an Emergency Use Authorization (EUA). This EUA will remain  in effect (meaning this test can be used) for the duration of the COVID-19 declaration under Section 564(b)(1) of the Act, 21 U.S.C. section 360bbb-3(b)(1), unless the authorization is terminated or revoked sooner.    Influenza A by PCR NEGATIVE NEGATIVE Final   Influenza B by PCR NEGATIVE NEGATIVE Final    Comment: (NOTE) The Xpert Xpress SARS-CoV-2/FLU/RSV assay is intended as an aid in  the diagnosis of influenza from Nasopharyngeal swab specimens and  should not be used as a sole basis for treatment. Nasal washings and  aspirates are unacceptable for Xpert Xpress SARS-CoV-2/FLU/RSV  testing. Fact Sheet for Patients: PinkCheek.be Fact Sheet for Healthcare Providers: GravelBags.it This test is not yet approved or cleared by the [place] FDA and  has been authorized for detection and/or diagnosis of SARS-CoV-2 by  FDA under an Emergency Use Authorization (EUA). This EUA will remain  in effect (meaning this test can be used) for the duration of the  Covid-19 declaration under Section 564(b)(1) of the Act, 21  U.S.C. section 360bbb-3(b)(1), unless the authorization is  terminated or revoked. Performed at [facility], [place] [address], [place], [place] [address]   Urine Culture     Status: None   Collection Time: 03/26/19  3:02 PM   Specimen: Urine, Clean Catch  Result Value Ref Range Status   Specimen Description   Final    URINE, CLEAN CATCH Performed at [facility], [place]  [address] [place], [place] [address]    Special Requests   Final    NONE Performed at [facility], [place] [address] [place], [place] [address]    Culture   Final    NO GROWTH Performed at [facility], [place] [address], [place], [place] [address]    Report Status 03/27/2019 FINAL  Final     Labs: Basic Metabolic Panel: Recent Labs  Lab 03/24/19 1253 03/26/19 1559 03/27/19 0431 03/28/19 0352 03/29/19 0449  NA 138 139 139 140 138  K 3.4* 4.0 4.8 4.0 3.4*  CL 101 99 100 102 105  CO2 26 30 28 24 24   GLUCOSE 109* 112* 140* 146* 105*  BUN 17 30* 34* 34* 23  CREATININE 1.02* 1.45* 1.26* 1.05* 0.92  CALCIUM 9.5 10.0 10.2 10.1 9.0  MG  --  2.3  --   --   --    Liver Function Tests: Recent Labs  Lab 03/26/19 1559 03/28/19 0352  AST 20 23  ALT 13 16  ALKPHOS 58 61  BILITOT 1.1 1.1  PROT 7.5 7.4  ALBUMIN 4.1 4.1   No results for input(s): LIPASE, AMYLASE in the last 168 hours. No results for input(s): AMMONIA in the last 168 hours. CBC: Recent Labs  Lab 03/24/19 1253 03/26/19 1559 03/27/19 0431 03/28/19 0352 03/29/19 0449  WBC 11.6* 15.3* 14.3* 18.4* 12.8*  NEUTROABS  --   --   --  15.4* 8.8*  HGB 14.1 15.7* 15.6* 15.9* 14.0  HCT 44.3 50.3* 50.8* 51.2* 44.9  MCV 86.4 87.0 86.2 86.2 87.5  PLT 242 288 284 279 248   Cardiac Enzymes: No results for input(s): CKTOTAL, CKMB, CKMBINDEX, TROPONINI in the last 168 hours. BNP: BNP (last 3 results) No results for input(s): BNP in the last 8760 hours.  ProBNP (last 3 results) No results for input(s): PROBNP in the last 8760 hours.  CBG: No results for input(s): GLUCAP in the last 168 hours.  Signed:  Nita Sells MD   [facility] 03/29/2019, 8:31 AM

## 2019-03-29 NOTE — Care Management Important Message (Signed)
Important Message  Patient Details IM Letter given to Marney Doctor RN Case Manager to present to the Patient Name: Cheryl Zamora MRN: [id number] Date of Birth: December 17, 1923   Medicare Important Message Given:  Yes     Kerin Salen 03/29/2019, 9:23 AM

## 2019-03-29 NOTE — TOC Transition Note (Signed)
Transition of Care [facility]) - CM/SW Discharge Note   Patient Details  Name: Cheryl Zamora MRN: [id number] Date of Birth: Aug 22, 1923  Transition of Care [facility]) CM/SW Contact:  Lynnell Catalan, RN Phone Number: 03/29/2019, 11:13 AM   Clinical Narrative:    Pt for dc to [facility] today. PTAR contacted for 2pm pick up. RN to call report to [phone] to speak with 600 hall RN. Pt to go to room 609. Nephew Phil contacted to alert of transfer to SNF today. Yellow DNR placed on shadow chart for DC.     Barriers to Discharge: No Barriers Identified   Patient Goals and CMS Choice Patient states their goals for this hospitalization and ongoing recovery are:: Per nephew, for patient to gain more strength CMS Medicare.gov Compare Post Acute Care list provided to:: Other (Comment Required)(Nephew) Choice offered to / list presented to : [facility] POA / Guardian(Nephew)  Discharge Placement                       Discharge Plan and Services In-house Referral: Clinical Social Work Discharge Planning Services: CM Consult Post Acute Care Choice: [place]

## 2019-04-03 DIAGNOSIS — R319 Hematuria, unspecified: Secondary | ICD-10-CM | POA: Diagnosis not present

## 2019-04-03 DIAGNOSIS — S82434D Nondisplaced oblique fracture of shaft of right fibula, subsequent encounter for closed fracture with routine healing: Secondary | ICD-10-CM | POA: Diagnosis not present

## 2019-04-03 DIAGNOSIS — N1330 Unspecified hydronephrosis: Secondary | ICD-10-CM | POA: Diagnosis not present

## 2019-04-03 DIAGNOSIS — Z1159 Encounter for screening for other viral diseases: Secondary | ICD-10-CM | POA: Diagnosis not present

## 2019-04-03 DIAGNOSIS — W19XXXD Unspecified fall, subsequent encounter: Secondary | ICD-10-CM | POA: Diagnosis not present

## 2019-04-04 DIAGNOSIS — I5033 Acute on chronic diastolic (congestive) heart failure: Secondary | ICD-10-CM | POA: Diagnosis not present

## 2019-04-04 DIAGNOSIS — I480 Paroxysmal atrial fibrillation: Secondary | ICD-10-CM | POA: Diagnosis not present

## 2019-04-04 DIAGNOSIS — N1330 Unspecified hydronephrosis: Secondary | ICD-10-CM | POA: Diagnosis not present

## 2019-04-04 DIAGNOSIS — R262 Difficulty in walking, not elsewhere classified: Secondary | ICD-10-CM | POA: Diagnosis not present

## 2019-04-04 DIAGNOSIS — M6281 Muscle weakness (generalized): Secondary | ICD-10-CM | POA: Diagnosis not present

## 2019-04-04 DIAGNOSIS — R6889 Other general symptoms and signs: Secondary | ICD-10-CM | POA: Diagnosis not present

## 2019-04-04 DIAGNOSIS — S82401D Unspecified fracture of shaft of right fibula, subsequent encounter for closed fracture with routine healing: Secondary | ICD-10-CM | POA: Diagnosis not present

## 2019-04-04 DIAGNOSIS — N131 Hydronephrosis with ureteral stricture, not elsewhere classified: Secondary | ICD-10-CM | POA: Diagnosis not present

## 2019-04-04 DIAGNOSIS — E569 Vitamin deficiency, unspecified: Secondary | ICD-10-CM | POA: Diagnosis not present

## 2019-04-04 DIAGNOSIS — R2681 Unsteadiness on feet: Secondary | ICD-10-CM | POA: Diagnosis not present

## 2019-04-04 DIAGNOSIS — S82434D Nondisplaced oblique fracture of shaft of right fibula, subsequent encounter for closed fracture with routine healing: Secondary | ICD-10-CM | POA: Diagnosis not present

## 2019-04-04 DIAGNOSIS — W19XXXD Unspecified fall, subsequent encounter: Secondary | ICD-10-CM | POA: Diagnosis not present

## 2019-04-04 DIAGNOSIS — M25571 Pain in right ankle and joints of right foot: Secondary | ICD-10-CM | POA: Diagnosis not present

## 2019-04-04 DIAGNOSIS — D649 Anemia, unspecified: Secondary | ICD-10-CM | POA: Diagnosis not present

## 2019-04-04 DIAGNOSIS — Z950 Presence of cardiac pacemaker: Secondary | ICD-10-CM | POA: Diagnosis not present

## 2019-04-04 DIAGNOSIS — N390 Urinary tract infection, site not specified: Secondary | ICD-10-CM | POA: Diagnosis not present

## 2019-04-04 DIAGNOSIS — R488 Other symbolic dysfunctions: Secondary | ICD-10-CM | POA: Diagnosis not present

## 2019-04-04 DIAGNOSIS — I10 Essential (primary) hypertension: Secondary | ICD-10-CM | POA: Diagnosis not present

## 2019-04-04 DIAGNOSIS — F0150 Vascular dementia without behavioral disturbance: Secondary | ICD-10-CM | POA: Diagnosis not present

## 2019-04-04 DIAGNOSIS — Z1159 Encounter for screening for other viral diseases: Secondary | ICD-10-CM | POA: Diagnosis not present

## 2019-04-04 DIAGNOSIS — R531 Weakness: Secondary | ICD-10-CM | POA: Diagnosis not present

## 2019-04-04 DIAGNOSIS — R319 Hematuria, unspecified: Secondary | ICD-10-CM | POA: Diagnosis not present

## 2019-04-09 ENCOUNTER — Telehealth: Payer: Self-pay | Admitting: Primary Care

## 2019-04-09 NOTE — Telephone Encounter (Signed)
T/c from Clearence Ped, to inquire about Palliative Care appointment. I returned call and we discussed Ms Malecki's case. I will call again after our visit on Wednesday.

## 2019-04-10 DIAGNOSIS — I480 Paroxysmal atrial fibrillation: Secondary | ICD-10-CM | POA: Diagnosis not present

## 2019-04-10 DIAGNOSIS — I5033 Acute on chronic diastolic (congestive) heart failure: Secondary | ICD-10-CM | POA: Diagnosis not present

## 2019-04-10 DIAGNOSIS — F0150 Vascular dementia without behavioral disturbance: Secondary | ICD-10-CM | POA: Diagnosis not present

## 2019-04-10 DIAGNOSIS — N131 Hydronephrosis with ureteral stricture, not elsewhere classified: Secondary | ICD-10-CM | POA: Diagnosis not present

## 2019-04-11 ENCOUNTER — Non-Acute Institutional Stay: Payer: Medicare Other | Admitting: Primary Care

## 2019-04-11 ENCOUNTER — Other Ambulatory Visit: Payer: Self-pay

## 2019-04-11 DIAGNOSIS — Z515 Encounter for palliative care: Secondary | ICD-10-CM

## 2019-04-11 DIAGNOSIS — N1330 Unspecified hydronephrosis: Secondary | ICD-10-CM | POA: Diagnosis not present

## 2019-04-11 DIAGNOSIS — W19XXXD Unspecified fall, subsequent encounter: Secondary | ICD-10-CM | POA: Diagnosis not present

## 2019-04-11 DIAGNOSIS — S82434D Nondisplaced oblique fracture of shaft of right fibula, subsequent encounter for closed fracture with routine healing: Secondary | ICD-10-CM | POA: Diagnosis not present

## 2019-04-11 DIAGNOSIS — R319 Hematuria, unspecified: Secondary | ICD-10-CM | POA: Diagnosis not present

## 2019-04-11 NOTE — Progress Notes (Signed)
[facility] Palliative Care Consult Note Telephone: [phone]  Fax: [phone]  TELEHEALTH VISIT STATEMENT Due to the COVID-19 crisis, this visit was done via telemedicine from my office. It was initiated and consented to by this patient and/or family.  PATIENT NAME: Cheryl Zamora [address] [place] [address] [phone] (home)  DOB: [birth year] MRN: [id number]  PRIMARY CARE PROVIDER:   Juluis Pitch, MD, [address] [place] [address] [phone]  [place]:  Juluis Pitch, MD [address] [place],  [place] [address] [phone]  RESPONSIBLE PARTY:   Extended Emergency Contact Information Primary Emergency Contact: Jarvis,Phil Address: [address]          [place], [place] [address] Johnnette Litter of [place] Phone: [phone] Mobile Phone: [phone] Relation: Nephew   ASSESSMENT AND RECOMMENDATIONS:   1. Advance Care Planning/Goals of Care: Goals include to maximize quality of life and symptom management. DNR and comfort measures. I discussed case with nephew [name]. We discussed the nodules on lung and adrenals, and that  there is high risk of cancer. We discussed her not being able to tolerate long term cancer treatments. She is on comfort measures and DNR. We discussed best venue for ongoing care, be it LTC or home with a caregiver.  2. Symptom Management:   History of nodules: Family defers work up as treatment would be onerous. There area several sites of nodules noted in recent scans. Has a 60 year smoking pack history.  Leg fracture: In CAM boot, ED recommended follow up with orthopedics. Please make a follow up appt with orthopedics, saw Ortho in ED, but is not established with anyone.  Constipation: Has had bms q 3 days and several times on bowel protocol. Recommend beginning senna daily.   Hematuria: No growth on 1/25 and 2/9, 2/3 equivocal report. On macrodantin.White count is still 14 K but  clinically she is not ill appearing.   3. Family /Caregiver/Community Supports: Alanson Puls is POA, was living in community prior to fall in Jan and [place] placement.  4. Cognitive / Functional decline:  Periods of clarity during day but appears to be sundowning on this interview. She states she has a 84 year old in the next room she needs to protect.   5. Follow up Palliative Care Visit: Palliative care will continue to follow for goals of care clarification and symptom management. Return 4-6 weeks or prn.  I spent 50 minutes providing this consultation,  from 1650 to 1740. More than 50% of the time in this consultation was spent coordinating communication.   HISTORY OF PRESENT ILLNESS:  Cheryl Zamora is a 86 y.o. year old female with multiple medical problems including CAD, PAF, CHF, CKD, hydronephrosis of left kidney, renal staghorn calculus, lung and adrenal nodules. Palliative Care was asked to follow this patient by consultation request of Juluis Pitch, MD to help address advance care planning and goals of care. This is an initial visit.  CODE STATUS:  DNR, Comfort measures  PPS: 30% HOSPICE ELIGIBILITY/DIAGNOSIS: TBD  PAST MEDICAL HISTORY:  Past Medical History:  Diagnosis Date  . Atrial fibrillation with rapid ventricular response ([place]) 11/2012; 10/2013   a. s/p TEE/DCCV 11/2012 & 10/2013 b. amiodarone caused significant QT prlongation (>650msec)  . Chronic combined systolic and diastolic CHF (congestive heart failure) ([place])   . Coronary artery disease 2005   a. s/p CABG (2012)  . GERD (gastroesophageal reflux disease)   . Hyperlipidemia    pt denies this  hx on 01/03/2015  . Hypertension   . Neuropathy   . Osteoarthritis of back     SOCIAL HX:  Social History   Tobacco Use  . Smoking status: Former Smoker    Packs/day: 1.00    Years: 60.00    Pack years: 60.00    Types: Cigarettes  . Smokeless tobacco: Never Used  Substance Use Topics  . Alcohol use: Yes    Comment:  01/03/2015 "might have a beer a couple times/yr"    ALLERGIES:  Allergies  Allergen Reactions  . Digoxin And Related Other (See Comments)    Increased HR  . Amiodarone Other (See Comments)    Excessive QT prolongation, nausea  . Codeine Nausea And Vomiting  . Penicillins Nausea Only    Did it involve swelling of the face/tongue/throat, SOB, or low BP? Unknown Did it involve sudden or severe rash/hives, skin peeling, or any reaction on the inside of your mouth or nose? Unknown Did you need to seek medical attention at a hospital or doctor's office? Unknown When did it last happen? If all above answers are "NO", may proceed with cephalosporin use.     PERTINENT MEDICATIONS:  Outpatient Encounter Medications as of 04/11/2019  Medication Sig  . acetaminophen (TYLENOL) 325 MG tablet Take 650 mg by mouth every 6 (six) hours as needed for mild pain.  . Cholecalciferol (VITAMIN D3) 1000 units CAPS Take 1,000 Units by mouth daily.  . memantine (NAMENDA) 10 MG tablet Take 10 mg by mouth daily.   . metoprolol tartrate (LOPRESSOR) 25 MG tablet Take 0.5 tablets (12.5 mg total) by mouth 2 (two) times daily.   No facility-administered encounter medications on file as of 04/11/2019.    PHYSICAL EXAM / ROS:   Current and past weights: 126 lbs at admission General: NAD, frail appearing, thin, not ill or distressed appearing Cardiovascular: no chest pain reported, no edema reported Pulmonary: no cough, no increased SOB, room air Abdomen: appetite 50% generally intake, endorses constipation, incontinent of bowel GU: endorses dysuria, incontinent of urine, Rx for UTI, Denies back pain. MSK:  no joint deformities, ambulatory to 3 ' with walker/ mod assist,  and  CAM boot for R fibula fx. Wt bearing as tol. Currently getting PT, OT.. Can do own grooming and teeth brushing, with cueing.  Skin: scabs to  L toe, moisture related redness in groin Neurological: Weakness,  Staff endorses  A and O x 3  at times, But appears to be sundowning now.  Jason Coop, NP, [facility]

## 2019-04-17 ENCOUNTER — Ambulatory Visit: Payer: Medicare Other | Admitting: Orthopaedic Surgery

## 2019-04-17 ENCOUNTER — Other Ambulatory Visit: Payer: Self-pay

## 2019-04-17 ENCOUNTER — Encounter: Payer: Self-pay | Admitting: Orthopaedic Surgery

## 2019-04-17 ENCOUNTER — Ambulatory Visit: Payer: Self-pay

## 2019-04-17 DIAGNOSIS — M25571 Pain in right ankle and joints of right foot: Secondary | ICD-10-CM

## 2019-04-17 NOTE — Progress Notes (Signed)
Office Visit Note   Patient: Cheryl Zamora           Date of Birth: February 17, 1924           MRN: [id number] Visit Date: 04/17/2019              Requested by: Juluis Pitch, MD [phone] [place],  [place] [address] PCP: Juluis Pitch, MD   Assessment & Plan: Visit Diagnoses:  1. Pain in right ankle and joints of right foot     Plan: Her ankle fracture is healing nicely.  We can actually transition her now to an ASO with full weightbearing as tolerated.  Therapy can work with getting her up to work on her balance and coordination and gait training.  All questions and concerns were answered and addressed.  We will see her back for hopefully a final visit in 4 weeks with another single mortise view of the right ankle.  Follow-Up Instructions: Return in about 4 weeks (around 05/15/2019).   Orders:  Orders Placed This Encounter  Procedures  . XR Ankle 2 Views Right   No orders of the defined types were placed in this encounter.     Procedures: No procedures performed   Clinical Data: No additional findings.   Subjective: Chief Complaint  Patient presents with  . Right Ankle - Pain  The patient is a very pleasant 86 year old female who comes today with her son.  She injured her right ankle about 3 weeks ago sustaining a nondisplaced lateral malleolus fracture.  They placed her in a cam walking boot and she is recovering in skilled nursing for mobility and therapy purposes/rehab purposes.  She has no other acute active medical issues.  She does report right ankle pain.  She has been ambulating in a cam walking boot. HPI  Review of Systems There currently no issues in terms of chest pain, shortness of breath, fever, chills, nausea, vomiting  Objective: Vital Signs: There were no vitals taken for this visit.  Physical Exam She is alert and orient x3 and in no acute distress Ortho Exam Examination of her right ankle does show some pain over the lateral malleolus but  there is no medial pain.  The ankle is well located and stable on exam. Specialty Comments:  No specialty comments available.  Imaging: XR Ankle 2 Views Right  Result Date: 04/17/2019 A single view of the right ankle shows an intact ankle mortise with a healing nondisplaced lateral malleolus fracture.    PMFS History: Patient Active Problem List   Diagnosis Date Noted  . Hydronephrosis of left kidney 03/26/2019  . Staghorn renal calculus 03/26/2019  . Nodule of middle lobe of right lung 03/26/2019  . Palliative care by specialist   . DNR (do not resuscitate) discussion   . Persistent atrial fibrillation ([place])   . CHF (congestive heart failure) ([place]) 11/09/2016  . DVT (deep venous thrombosis) ([place]) 01/13/2015  . Cellulitis of both lower extremities 01/13/2015  . Cellulitis 01/13/2015  . Acute deep vein thrombosis (DVT) of popliteal vein of left lower extremity ([place])   . Hematuria   . Acute on chronic renal insufficiency 01/06/2015  . Atrial fibrillation with RVR ([place])   . Sepsis ([place]) 01/02/2015  . UTI (lower urinary tract infection) 01/02/2015  . Paroxysmal atrial fibrillation ([facility])   . Chronic diastolic (congestive) heart failure ([place])   . Atrial fibrillation with rapid ventricular response ([place])   . Acute on chronic combined systolic and diastolic congestive  heart failure ([place]) 04/12/2014  . CKD (chronic kidney disease) stage 3, GFR 30-59 ml/min 04/11/2014  . CAD, multiple vessel, hx CABG 2012 04/11/2014  . Long term current use of anticoagulant therapy 12/21/2012  . Physical deconditioning 12/17/2012  . PAF (paroxysmal atrial fibrillation) ([place]) 12/12/2012  . Chronic diastolic CHF (congestive heart failure), NYHA class 3 ([place]) 12/12/2012  . Essential hypertension   . Neuropathy    Past Medical History:  Diagnosis Date  . Atrial fibrillation with rapid ventricular response ([place]) 11/2012; 10/2013   a. s/p TEE/DCCV 11/2012 & 10/2013 b. amiodarone caused significant QT  prlongation (>647msec)  . Chronic combined systolic and diastolic CHF (congestive heart failure) ([place])   . Coronary artery disease 2005   a. s/p CABG (2012)  . GERD (gastroesophageal reflux disease)   . Hyperlipidemia    pt denies this hx on 01/03/2015  . Hypertension   . Neuropathy   . Osteoarthritis of back     Family History  Problem Relation Age of Onset  . Heart attack Mother   . Heart attack Father   . Heart attack Brother   . Heart attack Brother   . Heart attack Brother   . Heart attack Brother     Past Surgical History:  Procedure Laterality Date  . ABDOMINAL HYSTERECTOMY    . AV NODE ABLATION N/A 11/16/2016   Procedure: AV Node Ablation;  Surgeon: Constance Haw, MD;  Location: [facility];  Service: Cardiovascular;  Laterality: N/A;  . BACK SURGERY    . CARDIAC CATHETERIZATION  04/12/2003   L[place] 60, LAD 90, D1 30, CFX 95->T, RCA OK  . CARDIOVERSION N/A 12/13/2012   Procedure: CARDIOVERSION;  Surgeon: Larey Dresser, MD;  Location: [facility];  Service: Cardiovascular;  Laterality: N/A;  . CARDIOVERSION N/A 11/13/2013   Procedure: CARDIOVERSION;  Surgeon: Belvia Spark, MD;  Location: [facility] ENDOSCOPY;  Service: Cardiovascular;  Laterality: N/A;  . CARDIOVERSION N/A 04/12/2014   Procedure: CARDIOVERSION;  Surgeon: Thayer Headings, MD;  Location: [facility] ENDOSCOPY;  Service: Cardiovascular;  Laterality: N/A;  . CARDIOVERSION N/A 01/06/2015   Procedure: CARDIOVERSION;  Surgeon: Larey Dresser, MD;  Location: [facility];  Service: Cardiovascular;  Laterality: N/A;  . CARDIOVERSION N/A 11/11/2016   Procedure: CARDIOVERSION;  Surgeon: Josue Hector, MD;  Location: [facility] ENDOSCOPY;  Service: Cardiovascular;  Laterality: N/A;  . [place]  1960's   "ruptured disc'  . CORONARY ANGIOPLASTY    . CORONARY ARTERY BYPASS GRAFT  04/18/2003   x 4; LIMA-LAD, SVG-D1, SVG-OM1-OM2  . PACEMAKER IMPLANT N/A 11/16/2016   Procedure: Pacemaker Implant;  Surgeon:  Constance Haw, MD;  Location: [facility];  Service: Cardiovascular;  Laterality: N/A;  . TEE WITHOUT CARDIOVERSION N/A 12/13/2012   Procedure: TRANSESOPHAGEAL ECHOCARDIOGRAM (TEE);  Surgeon: Larey Dresser, MD;  Location: [facility] ENDOSCOPY;  Service: Cardiovascular;  Laterality: N/A;  . TEE WITHOUT CARDIOVERSION N/A 11/13/2013   Procedure: TRANSESOPHAGEAL ECHOCARDIOGRAM (TEE);  Surgeon: Thara Spark, MD;  Location: [facility];  Service: Cardiovascular;  Laterality: N/A;  . TEE WITHOUT CARDIOVERSION N/A 11/11/2016   Procedure: TRANSESOPHAGEAL ECHOCARDIOGRAM (TEE);  Surgeon: Josue Hector, MD;  Location: [facility] ENDOSCOPY;  Service: Cardiovascular;  Laterality: N/A;  . TONSILLECTOMY     Social History   Occupational History  . Occupation: Retired  Tobacco Use  . Smoking status: Former Smoker    Packs/day: 1.00    Years: 60.00    Pack years: 60.00  Types: Cigarettes  . Smokeless tobacco: Never Used  Substance and Sexual Activity  . Alcohol use: Yes    Comment: 01/03/2015 "might have a beer a couple times/yr"  . Drug use: No  . Sexual activity: Never

## 2019-04-22 DIAGNOSIS — R319 Hematuria, unspecified: Secondary | ICD-10-CM | POA: Diagnosis not present

## 2019-04-22 DIAGNOSIS — I10 Essential (primary) hypertension: Secondary | ICD-10-CM | POA: Diagnosis not present

## 2019-04-22 DIAGNOSIS — I4891 Unspecified atrial fibrillation: Secondary | ICD-10-CM | POA: Diagnosis not present

## 2019-04-22 DIAGNOSIS — S82434D Nondisplaced oblique fracture of shaft of right fibula, subsequent encounter for closed fracture with routine healing: Secondary | ICD-10-CM | POA: Diagnosis not present

## 2019-04-24 ENCOUNTER — Telehealth: Payer: Self-pay | Admitting: Primary Care

## 2019-04-24 DIAGNOSIS — R262 Difficulty in walking, not elsewhere classified: Secondary | ICD-10-CM | POA: Diagnosis not present

## 2019-04-24 DIAGNOSIS — I5033 Acute on chronic diastolic (congestive) heart failure: Secondary | ICD-10-CM | POA: Diagnosis not present

## 2019-04-24 DIAGNOSIS — I480 Paroxysmal atrial fibrillation: Secondary | ICD-10-CM | POA: Diagnosis not present

## 2019-04-24 DIAGNOSIS — N131 Hydronephrosis with ureteral stricture, not elsewhere classified: Secondary | ICD-10-CM | POA: Diagnosis not present

## 2019-04-24 DIAGNOSIS — R2681 Unsteadiness on feet: Secondary | ICD-10-CM | POA: Diagnosis not present

## 2019-04-24 DIAGNOSIS — S82401D Unspecified fracture of shaft of right fibula, subsequent encounter for closed fracture with routine healing: Secondary | ICD-10-CM | POA: Diagnosis not present

## 2019-04-24 DIAGNOSIS — Z950 Presence of cardiac pacemaker: Secondary | ICD-10-CM | POA: Diagnosis not present

## 2019-04-24 DIAGNOSIS — I10 Essential (primary) hypertension: Secondary | ICD-10-CM | POA: Diagnosis not present

## 2019-04-24 DIAGNOSIS — F0150 Vascular dementia without behavioral disturbance: Secondary | ICD-10-CM | POA: Diagnosis not present

## 2019-04-24 DIAGNOSIS — E569 Vitamin deficiency, unspecified: Secondary | ICD-10-CM | POA: Diagnosis not present

## 2019-04-24 NOTE — Telephone Encounter (Signed)
T/c from Rudi Heap to clarify some placement questions. We discussed the benefit of placing from SNF to LTC. They are currently appealing SNF coverage but need to proceed with placement. I referred them to [facility] business office and sw dept. I will f/u with regularly scheduled visit.

## 2019-04-25 DIAGNOSIS — E569 Vitamin deficiency, unspecified: Secondary | ICD-10-CM | POA: Diagnosis not present

## 2019-04-25 DIAGNOSIS — R262 Difficulty in walking, not elsewhere classified: Secondary | ICD-10-CM | POA: Diagnosis not present

## 2019-04-25 DIAGNOSIS — S82401D Unspecified fracture of shaft of right fibula, subsequent encounter for closed fracture with routine healing: Secondary | ICD-10-CM | POA: Diagnosis not present

## 2019-04-25 DIAGNOSIS — Z950 Presence of cardiac pacemaker: Secondary | ICD-10-CM | POA: Diagnosis not present

## 2019-04-25 DIAGNOSIS — I10 Essential (primary) hypertension: Secondary | ICD-10-CM | POA: Diagnosis not present

## 2019-04-25 DIAGNOSIS — R2681 Unsteadiness on feet: Secondary | ICD-10-CM | POA: Diagnosis not present

## 2019-04-25 DIAGNOSIS — F0150 Vascular dementia without behavioral disturbance: Secondary | ICD-10-CM | POA: Diagnosis not present

## 2019-04-25 DIAGNOSIS — E559 Vitamin D deficiency, unspecified: Secondary | ICD-10-CM | POA: Diagnosis not present

## 2019-04-25 DIAGNOSIS — D649 Anemia, unspecified: Secondary | ICD-10-CM | POA: Diagnosis not present

## 2019-04-26 DIAGNOSIS — R2681 Unsteadiness on feet: Secondary | ICD-10-CM | POA: Diagnosis not present

## 2019-04-26 DIAGNOSIS — F0150 Vascular dementia without behavioral disturbance: Secondary | ICD-10-CM | POA: Diagnosis not present

## 2019-04-26 DIAGNOSIS — E569 Vitamin deficiency, unspecified: Secondary | ICD-10-CM | POA: Diagnosis not present

## 2019-04-26 DIAGNOSIS — S82401D Unspecified fracture of shaft of right fibula, subsequent encounter for closed fracture with routine healing: Secondary | ICD-10-CM | POA: Diagnosis not present

## 2019-04-26 DIAGNOSIS — R262 Difficulty in walking, not elsewhere classified: Secondary | ICD-10-CM | POA: Diagnosis not present

## 2019-04-26 DIAGNOSIS — I10 Essential (primary) hypertension: Secondary | ICD-10-CM | POA: Diagnosis not present

## 2019-04-26 DIAGNOSIS — Z950 Presence of cardiac pacemaker: Secondary | ICD-10-CM | POA: Diagnosis not present

## 2019-04-27 DIAGNOSIS — Z950 Presence of cardiac pacemaker: Secondary | ICD-10-CM | POA: Diagnosis not present

## 2019-04-27 DIAGNOSIS — E569 Vitamin deficiency, unspecified: Secondary | ICD-10-CM | POA: Diagnosis not present

## 2019-04-27 DIAGNOSIS — S82401D Unspecified fracture of shaft of right fibula, subsequent encounter for closed fracture with routine healing: Secondary | ICD-10-CM | POA: Diagnosis not present

## 2019-04-27 DIAGNOSIS — I10 Essential (primary) hypertension: Secondary | ICD-10-CM | POA: Diagnosis not present

## 2019-04-27 DIAGNOSIS — R2681 Unsteadiness on feet: Secondary | ICD-10-CM | POA: Diagnosis not present

## 2019-04-27 DIAGNOSIS — R262 Difficulty in walking, not elsewhere classified: Secondary | ICD-10-CM | POA: Diagnosis not present

## 2019-04-27 DIAGNOSIS — F0150 Vascular dementia without behavioral disturbance: Secondary | ICD-10-CM | POA: Diagnosis not present

## 2019-04-30 DIAGNOSIS — Z950 Presence of cardiac pacemaker: Secondary | ICD-10-CM | POA: Diagnosis not present

## 2019-04-30 DIAGNOSIS — F0150 Vascular dementia without behavioral disturbance: Secondary | ICD-10-CM | POA: Diagnosis not present

## 2019-04-30 DIAGNOSIS — R2681 Unsteadiness on feet: Secondary | ICD-10-CM | POA: Diagnosis not present

## 2019-04-30 DIAGNOSIS — I10 Essential (primary) hypertension: Secondary | ICD-10-CM | POA: Diagnosis not present

## 2019-04-30 DIAGNOSIS — R488 Other symbolic dysfunctions: Secondary | ICD-10-CM | POA: Diagnosis not present

## 2019-04-30 DIAGNOSIS — S82401D Unspecified fracture of shaft of right fibula, subsequent encounter for closed fracture with routine healing: Secondary | ICD-10-CM | POA: Diagnosis not present

## 2019-04-30 DIAGNOSIS — R262 Difficulty in walking, not elsewhere classified: Secondary | ICD-10-CM | POA: Diagnosis not present

## 2019-04-30 DIAGNOSIS — E569 Vitamin deficiency, unspecified: Secondary | ICD-10-CM | POA: Diagnosis not present

## 2019-05-07 DIAGNOSIS — F0390 Unspecified dementia without behavioral disturbance: Secondary | ICD-10-CM | POA: Diagnosis not present

## 2019-05-07 DIAGNOSIS — Z0189 Encounter for other specified special examinations: Secondary | ICD-10-CM | POA: Diagnosis not present

## 2019-05-07 DIAGNOSIS — S8264XD Nondisplaced fracture of lateral malleolus of right fibula, subsequent encounter for closed fracture with routine healing: Secondary | ICD-10-CM | POA: Diagnosis not present

## 2019-05-07 DIAGNOSIS — I10 Essential (primary) hypertension: Secondary | ICD-10-CM | POA: Diagnosis not present

## 2019-05-14 DIAGNOSIS — W19XXXA Unspecified fall, initial encounter: Secondary | ICD-10-CM | POA: Diagnosis not present

## 2019-05-14 DIAGNOSIS — S61412A Laceration without foreign body of left hand, initial encounter: Secondary | ICD-10-CM | POA: Diagnosis not present

## 2019-05-15 ENCOUNTER — Ambulatory Visit: Payer: Self-pay

## 2019-05-15 ENCOUNTER — Encounter: Payer: Self-pay | Admitting: Orthopaedic Surgery

## 2019-05-15 ENCOUNTER — Other Ambulatory Visit: Payer: Self-pay

## 2019-05-15 ENCOUNTER — Ambulatory Visit: Payer: Medicare Other | Admitting: Orthopaedic Surgery

## 2019-05-15 DIAGNOSIS — M25571 Pain in right ankle and joints of right foot: Secondary | ICD-10-CM

## 2019-05-15 NOTE — Progress Notes (Signed)
The patient is a 86 year old female with a lateral malleolus fracture of the right ankle that we have been following for the last 8 weeks.  This is a nondisplaced fracture.  She was first treated in a walking boot and then an ASO.  She denies any pain with her ankle.  She still needs an Environmental consultant when putting weight on the ankle itself according to family who is with her.  On exam she has full range of motion actively and passively of her right ankle and it is stable on exam.  It does not seem to be hurting.  A single mortise view of the right ankle shows the ankle joint is congruent and the fracture is almost completely healed at this standpoint.  Given her clinical exam findings and x-ray findings she does not need a brace at this standpoint for her right ankle.  She can weight-bear as tolerated.  I filled out information for the skilled nursing facility.  All questions and concerns were answered and addressed.  Follow-up will be as needed.

## 2019-05-21 DIAGNOSIS — S61412D Laceration without foreign body of left hand, subsequent encounter: Secondary | ICD-10-CM | POA: Diagnosis not present

## 2019-05-23 ENCOUNTER — Other Ambulatory Visit: Payer: Self-pay

## 2019-05-23 ENCOUNTER — Non-Acute Institutional Stay: Payer: Medicare Other | Admitting: Primary Care

## 2019-06-01 ENCOUNTER — Non-Acute Institutional Stay: Payer: Medicare Other | Admitting: Internal Medicine

## 2019-06-01 ENCOUNTER — Encounter: Payer: Self-pay | Admitting: Internal Medicine

## 2019-06-01 ENCOUNTER — Other Ambulatory Visit: Payer: Self-pay

## 2019-06-01 VITALS — Wt 124.0 lb

## 2019-06-01 DIAGNOSIS — Z515 Encounter for palliative care: Secondary | ICD-10-CM | POA: Diagnosis not present

## 2019-06-01 DIAGNOSIS — Z7189 Other specified counseling: Secondary | ICD-10-CM

## 2019-06-01 NOTE — Progress Notes (Signed)
April 2nd, 2021 [place] Consult Note Telephone: [phone]  Fax: [phone]  PATIENT NAME: Cheryl Zamora  DOB: 09/06/1923 MRN: [id number] [place] Rm. 2 (move in date 05/02/2019)  PRIMARY CARE PROVIDER:   Simon Rhein [facility] ([phone], [phone]  REFERRING PROVIDER:  Simon Rhein [facility] ([phone], (O) [phone]  RESPONSIBLE PARTY: (nephew/HCPOA) Jarvis,Phil (H) [phone]  ASSESSMENT AND RECOMMENDATIONS:   1. Advance Care Planning: A. Directives: DNR (form in [place]). No plans to w/u lung/adrenal nodules (suspicious for cancer) d/t age/fraility/dementia.   B. Goals of Care:  maximize quality of life and symptom management.   2. Cognitive/Functional Assessment: [name] very forgetful, and recognizes she is. She is alert and oriented to self. Knows she broke a bone in left lower leg but can't remember subsequent hospitalization/rehab. Knew she was not home but didn't know name of facility. She was pleasantly conversant, engaged, and spoke on topic. She denied pain. Staff report she is an assist to transfer but she has been seen ambulating independently to the bathroom with her walker. PT is following. She needs assist with hygiene, dressing and usually transfers. She is often incontinent. Staff report moving bowels regularly. Her current weight is 124lbs (from 126 2 months earlier). Staff report she is a light eater.  3. Family /Caregiver/Community Supports: Now resident of [place]. Nephew is HCPOA.  4. Follow up Palliative Care Visit: In 1-2 months.Will call nephew with updates of today's visit.  I spent 60 minutes providing this consultation from 9am-10am. More than 50% of the this time was spent coordinating communication.   HISTORY OF PRESENT ILLNESS:  Cheryl Zamora is a 86 y.o. year old female with h/o CAD (CABG 2012), PAF (xarelto d/c 2/2 hematuria), dCHF,  HTN, CKD, hydronephrosis of left kidney, renal staghorn calculus, lung and adrenal nodules. 03/22/2019 Fx fibula. Discharged to home with a walking boot and weightbearing as tolerated.   03/23/19-03/29/2019 hospitalized L kidney hydronephrosis 2/2 lg staghorn calculus (consertive management) 1/28-05/02/19: Rehab [facility]. Lace up boot R foot.    Palliative Care was asked to follow for assistance in advance care planning and goals of care. This a f/u Palliative Care Visit from 04/11/2019.  CODE STATUS:  DNR, Comfort measures  PPS: 30-40% HOSPICE ELIGIBILITY/DIAGNOSIS: TBD  PAST MEDICAL HISTORY:  Past Medical History:  Diagnosis Date  . Atrial fibrillation with rapid ventricular response ([place]) 11/2012; 10/2013   a. s/p TEE/DCCV 11/2012 & 10/2013 b. amiodarone caused significant QT prlongation (>623msec)  . Chronic combined systolic and diastolic CHF (congestive heart failure) ([place])   . Coronary artery disease 2005   a. s/p CABG (2012)  . GERD (gastroesophageal reflux disease)   . Hyperlipidemia    pt denies this hx on 01/03/2015  . Hypertension   . Neuropathy   . Osteoarthritis of back     SOCIAL HX:  Social History   Tobacco Use  . Smoking status: Former Smoker    Packs/day: 1.00    Years: 60.00    Pack years: 60.00    Types: Cigarettes  . Smokeless tobacco: Never Used  Substance Use Topics  . Alcohol use: Yes    Comment: 01/03/2015 "might have a beer a couple times/yr"    ALLERGIES:  Allergies  Allergen Reactions  . Digoxin And Related Other (See Comments)    Increased HR  . Amiodarone Other (See Comments)  Excessive QT prolongation, nausea  . Codeine Nausea And Vomiting  . Penicillins Nausea Only    Did it involve swelling of the face/tongue/throat, SOB, or low BP? Unknown Did it involve sudden or severe rash/hives, skin peeling, or any reaction on the inside of your mouth or nose? Unknown Did you need to seek medical attention at a hospital or doctor's  office? Unknown When did it last happen? If all above answers are "NO", may proceed with cephalosporin use.     PERTINENT MEDICATIONS:  Outpatient Encounter Medications as of 06/01/2019  Medication Sig  . acetaminophen (TYLENOL) 325 MG tablet Take 650 mg by mouth every 6 (six) hours as needed for mild pain.  . Cholecalciferol (VITAMIN D3) 1000 units CAPS Take 1,000 Units by mouth daily.  . memantine (NAMENDA) 10 MG tablet Take 10 mg by mouth daily.   . metoprolol tartrate (LOPRESSOR) 25 MG tablet Take 0.5 tablets (12.5 mg total) by mouth 2 (two) times daily.   No facility-administered encounter medications on file as of 06/01/2019.    PHYSICAL EXAM:   Pleasantly conversant,elderly, slender and frail appearing elderly female. NAD. Sitting up in her wheelchair at the breakfast table.  PE deferred to limit COVID exposure.  No LE edema.   Julianne Handler, NP

## 2019-06-05 ENCOUNTER — Telehealth: Payer: Self-pay | Admitting: Internal Medicine

## 2019-06-05 NOTE — Telephone Encounter (Signed)
1:45pm TC to nephew Rudi Heap, mentioning I had visited his Aunt 06/01/2019. I provided my contact information and invited him to call me for visit details and updates. Violeta Gelinas NP-C [phone]

## 2019-06-11 DIAGNOSIS — F0390 Unspecified dementia without behavioral disturbance: Secondary | ICD-10-CM | POA: Diagnosis not present

## 2019-06-11 DIAGNOSIS — S8264XD Nondisplaced fracture of lateral malleolus of right fibula, subsequent encounter for closed fracture with routine healing: Secondary | ICD-10-CM | POA: Diagnosis not present

## 2019-06-11 DIAGNOSIS — I10 Essential (primary) hypertension: Secondary | ICD-10-CM | POA: Diagnosis not present

## 2019-07-02 DIAGNOSIS — F0390 Unspecified dementia without behavioral disturbance: Secondary | ICD-10-CM | POA: Diagnosis not present

## 2019-07-02 DIAGNOSIS — S91109A Unspecified open wound of unspecified toe(s) without damage to nail, initial encounter: Secondary | ICD-10-CM | POA: Diagnosis not present

## 2019-07-09 DIAGNOSIS — I10 Essential (primary) hypertension: Secondary | ICD-10-CM | POA: Diagnosis not present

## 2019-07-09 DIAGNOSIS — R2689 Other abnormalities of gait and mobility: Secondary | ICD-10-CM | POA: Diagnosis not present

## 2019-07-09 DIAGNOSIS — S8264XD Nondisplaced fracture of lateral malleolus of right fibula, subsequent encounter for closed fracture with routine healing: Secondary | ICD-10-CM | POA: Diagnosis not present

## 2019-07-09 DIAGNOSIS — F0390 Unspecified dementia without behavioral disturbance: Secondary | ICD-10-CM | POA: Diagnosis not present

## 2019-07-10 DIAGNOSIS — I5043 Acute on chronic combined systolic (congestive) and diastolic (congestive) heart failure: Secondary | ICD-10-CM | POA: Diagnosis not present

## 2019-07-10 DIAGNOSIS — S82401D Unspecified fracture of shaft of right fibula, subsequent encounter for closed fracture with routine healing: Secondary | ICD-10-CM | POA: Diagnosis not present

## 2019-07-10 DIAGNOSIS — I130 Hypertensive heart and chronic kidney disease with heart failure and stage 1 through stage 4 chronic kidney disease, or unspecified chronic kidney disease: Secondary | ICD-10-CM | POA: Diagnosis not present

## 2019-07-20 ENCOUNTER — Emergency Department (HOSPITAL_COMMUNITY)
Admission: EM | Admit: 2019-07-20 | Discharge: 2019-07-21 | Disposition: A | Discharge status: Skilled Nursing Facility | Payer: Medicare Other | Attending: Emergency Medicine | Admitting: Emergency Medicine

## 2019-07-20 ENCOUNTER — Other Ambulatory Visit: Payer: Self-pay

## 2019-07-20 DIAGNOSIS — R69 Illness, unspecified: Secondary | ICD-10-CM | POA: Diagnosis not present

## 2019-07-20 DIAGNOSIS — I10 Essential (primary) hypertension: Secondary | ICD-10-CM | POA: Insufficient documentation

## 2019-07-20 DIAGNOSIS — W19XXXA Unspecified fall, initial encounter: Secondary | ICD-10-CM | POA: Insufficient documentation

## 2019-07-20 DIAGNOSIS — Z87891 Personal history of nicotine dependence: Secondary | ICD-10-CM | POA: Insufficient documentation

## 2019-07-20 DIAGNOSIS — Z79899 Other long term (current) drug therapy: Secondary | ICD-10-CM | POA: Diagnosis not present

## 2019-07-20 DIAGNOSIS — M545 Low back pain: Secondary | ICD-10-CM | POA: Diagnosis not present

## 2019-07-20 DIAGNOSIS — M546 Pain in thoracic spine: Secondary | ICD-10-CM | POA: Diagnosis not present

## 2019-07-20 DIAGNOSIS — Y999 Unspecified external cause status: Secondary | ICD-10-CM | POA: Insufficient documentation

## 2019-07-20 DIAGNOSIS — Y92128 Other place in nursing home as the place of occurrence of the external cause: Secondary | ICD-10-CM | POA: Insufficient documentation

## 2019-07-20 DIAGNOSIS — R5381 Other malaise: Secondary | ICD-10-CM | POA: Diagnosis not present

## 2019-07-20 DIAGNOSIS — S199XXA Unspecified injury of neck, initial encounter: Secondary | ICD-10-CM | POA: Diagnosis not present

## 2019-07-20 DIAGNOSIS — S0990XA Unspecified injury of head, initial encounter: Secondary | ICD-10-CM | POA: Insufficient documentation

## 2019-07-20 DIAGNOSIS — S79912A Unspecified injury of left hip, initial encounter: Secondary | ICD-10-CM | POA: Diagnosis not present

## 2019-07-20 DIAGNOSIS — Y9389 Activity, other specified: Secondary | ICD-10-CM | POA: Insufficient documentation

## 2019-07-20 NOTE — ED Triage Notes (Signed)
Pt comes to ed via ems, c/o fall Tonight at 10:30pm with no reported injury per ems. Pt fell on lament floor unwitnessed. Facility sent pt to be evaluated. Pt is DNR and per her baseline dementia. V/s on arrival bp 150/84, hr 84, rr18, spo2 98.  Pain score n/a.

## 2019-07-21 ENCOUNTER — Emergency Department (HOSPITAL_COMMUNITY): Payer: Medicare Other

## 2019-07-21 DIAGNOSIS — M545 Low back pain: Secondary | ICD-10-CM | POA: Diagnosis not present

## 2019-07-21 DIAGNOSIS — W19XXXA Unspecified fall, initial encounter: Secondary | ICD-10-CM | POA: Diagnosis not present

## 2019-07-21 DIAGNOSIS — Z7401 Bed confinement status: Secondary | ICD-10-CM | POA: Diagnosis not present

## 2019-07-21 DIAGNOSIS — M2550 Pain in unspecified joint: Secondary | ICD-10-CM | POA: Diagnosis not present

## 2019-07-21 DIAGNOSIS — G4489 Other headache syndrome: Secondary | ICD-10-CM | POA: Diagnosis not present

## 2019-07-21 DIAGNOSIS — S79912A Unspecified injury of left hip, initial encounter: Secondary | ICD-10-CM | POA: Diagnosis not present

## 2019-07-21 DIAGNOSIS — M546 Pain in thoracic spine: Secondary | ICD-10-CM | POA: Diagnosis not present

## 2019-07-21 DIAGNOSIS — S0990XA Unspecified injury of head, initial encounter: Secondary | ICD-10-CM | POA: Diagnosis not present

## 2019-07-21 DIAGNOSIS — S199XXA Unspecified injury of neck, initial encounter: Secondary | ICD-10-CM | POA: Diagnosis not present

## 2019-07-21 MED ORDER — ACETAMINOPHEN 500 MG PO TABS
1000.0000 mg | ORAL_TABLET | Freq: Once | ORAL | Status: AC
  Administered 2019-07-21: 1000 mg via ORAL
  Filled 2019-07-21: qty 2

## 2019-07-21 NOTE — ED Provider Notes (Signed)
TIME SEEN: 12:10 AM  CHIEF COMPLAINT: Unwitnessed fall  HPI: [name] a 86 y.o. F with history of atrial fibrillation not on anticoagulation, hypertension, hyperlipidemia, CAD status post CABG, dementia who presents to the emergency department with [facility] after she had an unwitnessed fall.  Spoke with Lorenz Coaster, nurse at [facility].  Patient was found laying on her left side on the floor.  Cried out in pain when they tried to get her off the floor.  Mostly wheelchair and bed bound.  Facility contacted patient's son prior to transport.  [name] a DNR per her paperwork.  ROS: Level 5 caveat due to dementia  PAST MEDICAL HISTORY/PAST SURGICAL HISTORY:  Past Medical History:  Diagnosis Date  . Atrial fibrillation with rapid ventricular response ([place]) 11/2012; 10/2013   a. s/p TEE/DCCV 11/2012 & 10/2013 b. amiodarone caused significant QT prlongation (>631msec)  . Chronic combined systolic and diastolic CHF (congestive heart failure) ([place])   . Coronary artery disease 2005   a. s/p CABG (2012)  . GERD (gastroesophageal reflux disease)   . Hyperlipidemia    pt denies this hx on 01/03/2015  . Hypertension   . Neuropathy   . Osteoarthritis of back     MEDICATIONS:  Prior to Admission medications   Medication Sig Start Date End Date Taking? Authorizing Provider  acetaminophen (TYLENOL) 325 MG tablet Take 650 mg by mouth every 6 (six) hours as needed for mild pain.    [provider]  Amino Acids-Protein Hydrolys (FEEDING SUPPLEMENT, PRO-STAT SUGAR FREE 64,) LIQD Take 30 mLs by mouth daily.    [provider]  Cholecalciferol (VITAMIN D3) 1000 units CAPS Take 1,000 Units by mouth daily.    [provider]  memantine (NAMENDA) 10 MG tablet Take 10 mg by mouth daily.     [provider]  metoprolol tartrate (LOPRESSOR) 25 MG tablet Take 0.5 tablets (12.5 mg total) by mouth 2 (two) times daily. 03/29/19   Nita Sells, MD   Multiple Vitamin (MULTIVITAMIN) tablet Take 1 tablet by mouth daily.    [provider]    ALLERGIES:  Allergies  Allergen Reactions  . Digoxin And Related Other (See Comments)    Increased HR  . Amiodarone Other (See Comments)    Excessive QT prolongation, nausea  . Codeine Nausea And Vomiting  . Penicillins Nausea Only    Did it involve swelling of the face/tongue/throat, SOB, or low BP? Unknown Did it involve sudden or severe rash/hives, skin peeling, or any reaction on the inside of your mouth or nose? Unknown Did you need to seek medical attention at a hospital or doctor's office? Unknown When did it last happen? If all above answers are "NO", may proceed with cephalosporin use.    SOCIAL HISTORY:  Social History   Tobacco Use  . Smoking status: Former Smoker    Packs/day: 1.00    Years: 60.00    Pack years: 60.00    Types: Cigarettes  . Smokeless tobacco: Never Used  Substance Use Topics  . Alcohol use: Yes    Comment: 01/03/2015 "might have a beer a couple times/yr"    FAMILY HISTORY: Family History  Problem Relation Age of Onset  . Heart attack Mother   . Heart attack Father   . Heart attack Brother   . Heart attack Brother   . Heart attack Brother   . Heart attack Brother     EXAM: BP (!) 141/58 (BP Location: Right Arm)  Pulse 88   Temp 98.6 F (37 C) (Oral)   Resp 14   SpO2 99%  CONSTITUTIONAL: Alert and oriented to person only.  Elderly.  In no distress. HEAD: Normocephalic; atraumatic EYES: Conjunctivae clear, PERRL, EOMI ENT: normal nose; no rhinorrhea; moist mucous membranes; pharynx without lesions noted; no dental injury; no septal hematoma NECK: Supple, no meningismus, no LAD; no midline spinal tenderness, step-off or deformity; trachea midline, cervical collar in place CARD: RRR; S1 and S2 appreciated; no murmurs, no clicks, no rubs, no gallops RESP: Normal chest excursion without splinting or tachypnea; breath sounds clear  and equal bilaterally; no wheezes, no rhonchi, no rales; no hypoxia or respiratory distress CHEST:  chest wall stable, no crepitus or ecchymosis or deformity, nontender to palpation; no flail chest ABD/GI: Normal bowel sounds; non-distended; soft, non-tender, no rebound, no guarding; no ecchymosis or other lesions noted PELVIS:  stable, nontender to palpation, I am able to fully flex and extend both hips, no leg length discrepancy BACK:  The back appears normal and is non-tender to palpation, there is no CVA tenderness; no midline spinal tenderness, step-off or deformity EXT: Normal ROM in all joints; non-tender to palpation; no edema; normal capillary refill; no cyanosis, no bony tenderness or bony deformity of patient's extremities, no joint effusion, compartments are soft, extremities are warm and well-perfused, no ecchymosis SKIN: Normal color for age and race; warm NEURO: Moves all extremities equally, no facial asymmetry, normal speech PSYCH: The patient's mood and manner are appropriate. Grooming and personal hygiene are appropriate.  MEDICAL DECISION MAKING: Patient here with unwitnessed fall.  She has no sign of traumatic injury on exam but when I try to sit her upright in bed she cries out but is unable to tell me reliably what is hurting.  At times she will tell me it is her mid back than her lower back.  She also tells me that she thinks there is a "knot" in her left hip.  She is not on blood thinners per her MAR.  Will give Tylenol for pain and obtain CT imaging of the head and cervical spine, x-rays of the thoracic lumbar spine and of the left hip.  ED PROGRESS: Imaging reviewed/interpreted and shows no acute injury.  C-collar removed.  Patient will be discharged back to [facility]  I have attempted to get in contact with her nephew, [name], who is listed as her healthcare power of attorney at [phone].  Have left HIPAA compliant voicemail.  At this time, I do not feel there is any  life-threatening condition present. I have reviewed, interpreted and discussed all results (EKG, imaging, lab, urine as appropriate) and exam findings with patient/family. I have reviewed nursing notes and appropriate previous records.  I feel the patient is safe to be discharged home without further emergent workup and can continue workup as an outpatient as needed. Discussed usual and customary return precautions. Patient/family verbalize understanding and are comfortable with this plan.  Outpatient follow-up has been provided as needed. All questions have been answered.    Cheryl Zamora was evaluated in Emergency Department on 07/21/2019 for the symptoms described in the history of present illness. She was evaluated in the context of the global COVID-19 pandemic, which necessitated consideration that the patient might be at risk for infection with the SARS-CoV-2 virus that causes COVID-19. Institutional protocols and algorithms that pertain to the evaluation of patients at risk for COVID-19 are in a state of rapid change based on information released by  regulatory bodies including the CDC and federal and state organizations. These policies and algorithms were followed during the patient's care in the ED.       Cheryl Zamora, Delice Bison, DO 07/21/19 [phone]

## 2019-07-21 NOTE — ED Notes (Signed)
Called [facility]  Called facility for report given to return  Paperwork complete

## 2019-07-21 NOTE — Discharge Instructions (Addendum)
CT of patient's head and cervical spine showed no acute injury.  X-ray of the thoracic and lumbar spine as well as left hip and pelvis showed no acute injury.  Please monitor for signs of confusion, somnolence, complaints of pain, vomiting, focal weakness.

## 2019-07-23 DIAGNOSIS — F0390 Unspecified dementia without behavioral disturbance: Secondary | ICD-10-CM | POA: Diagnosis not present

## 2019-07-23 DIAGNOSIS — W19XXXA Unspecified fall, initial encounter: Secondary | ICD-10-CM | POA: Diagnosis not present

## 2019-07-26 ENCOUNTER — Encounter (HOSPITAL_COMMUNITY): Payer: Self-pay | Admitting: Internal Medicine

## 2019-07-26 ENCOUNTER — Inpatient Hospital Stay (HOSPITAL_COMMUNITY)
Admission: EM | Admit: 2019-07-26 | Discharge: 2019-08-02 | DRG: 481 | Disposition: A | Discharge status: Skilled Nursing Facility | Payer: Medicare Other | Attending: Internal Medicine | Admitting: Internal Medicine

## 2019-07-26 ENCOUNTER — Emergency Department (HOSPITAL_COMMUNITY): Payer: Medicare Other

## 2019-07-26 ENCOUNTER — Other Ambulatory Visit: Payer: Self-pay

## 2019-07-26 DIAGNOSIS — M2550 Pain in unspecified joint: Secondary | ICD-10-CM | POA: Diagnosis not present

## 2019-07-26 DIAGNOSIS — R911 Solitary pulmonary nodule: Secondary | ICD-10-CM | POA: Diagnosis present

## 2019-07-26 DIAGNOSIS — R41841 Cognitive communication deficit: Secondary | ICD-10-CM | POA: Diagnosis not present

## 2019-07-26 DIAGNOSIS — S2231XA Fracture of one rib, right side, initial encounter for closed fracture: Secondary | ICD-10-CM | POA: Diagnosis present

## 2019-07-26 DIAGNOSIS — Z79899 Other long term (current) drug therapy: Secondary | ICD-10-CM

## 2019-07-26 DIAGNOSIS — Z993 Dependence on wheelchair: Secondary | ICD-10-CM

## 2019-07-26 DIAGNOSIS — S72142A Displaced intertrochanteric fracture of left femur, initial encounter for closed fracture: Principal | ICD-10-CM | POA: Diagnosis present

## 2019-07-26 DIAGNOSIS — R296 Repeated falls: Secondary | ICD-10-CM | POA: Diagnosis present

## 2019-07-26 DIAGNOSIS — R05 Cough: Secondary | ICD-10-CM | POA: Diagnosis not present

## 2019-07-26 DIAGNOSIS — N1832 Chronic kidney disease, stage 3b: Secondary | ICD-10-CM | POA: Diagnosis not present

## 2019-07-26 DIAGNOSIS — E785 Hyperlipidemia, unspecified: Secondary | ICD-10-CM | POA: Diagnosis not present

## 2019-07-26 DIAGNOSIS — M79643 Pain in unspecified hand: Secondary | ICD-10-CM | POA: Diagnosis not present

## 2019-07-26 DIAGNOSIS — N183 Chronic kidney disease, stage 3 unspecified: Secondary | ICD-10-CM | POA: Diagnosis not present

## 2019-07-26 DIAGNOSIS — R262 Difficulty in walking, not elsewhere classified: Secondary | ICD-10-CM | POA: Diagnosis not present

## 2019-07-26 DIAGNOSIS — Z20822 Contact with and (suspected) exposure to covid-19: Secondary | ICD-10-CM | POA: Diagnosis not present

## 2019-07-26 DIAGNOSIS — I130 Hypertensive heart and chronic kidney disease with heart failure and stage 1 through stage 4 chronic kidney disease, or unspecified chronic kidney disease: Secondary | ICD-10-CM | POA: Diagnosis not present

## 2019-07-26 DIAGNOSIS — I480 Paroxysmal atrial fibrillation: Secondary | ICD-10-CM | POA: Diagnosis not present

## 2019-07-26 DIAGNOSIS — I2510 Atherosclerotic heart disease of native coronary artery without angina pectoris: Secondary | ICD-10-CM | POA: Diagnosis present

## 2019-07-26 DIAGNOSIS — M542 Cervicalgia: Secondary | ICD-10-CM | POA: Diagnosis not present

## 2019-07-26 DIAGNOSIS — Z4789 Encounter for other orthopedic aftercare: Secondary | ICD-10-CM | POA: Diagnosis not present

## 2019-07-26 DIAGNOSIS — D72829 Elevated white blood cell count, unspecified: Secondary | ICD-10-CM | POA: Diagnosis not present

## 2019-07-26 DIAGNOSIS — W19XXXA Unspecified fall, initial encounter: Secondary | ICD-10-CM | POA: Diagnosis present

## 2019-07-26 DIAGNOSIS — I5042 Chronic combined systolic (congestive) and diastolic (congestive) heart failure: Secondary | ICD-10-CM | POA: Diagnosis not present

## 2019-07-26 DIAGNOSIS — I509 Heart failure, unspecified: Secondary | ICD-10-CM | POA: Diagnosis not present

## 2019-07-26 DIAGNOSIS — R5381 Other malaise: Secondary | ICD-10-CM | POA: Diagnosis not present

## 2019-07-26 DIAGNOSIS — Z885 Allergy status to narcotic agent status: Secondary | ICD-10-CM

## 2019-07-26 DIAGNOSIS — M6281 Muscle weakness (generalized): Secondary | ICD-10-CM | POA: Diagnosis not present

## 2019-07-26 DIAGNOSIS — Z880 Allergy status to penicillin: Secondary | ICD-10-CM

## 2019-07-26 DIAGNOSIS — I10 Essential (primary) hypertension: Secondary | ICD-10-CM | POA: Diagnosis present

## 2019-07-26 DIAGNOSIS — Z7401 Bed confinement status: Secondary | ICD-10-CM | POA: Diagnosis not present

## 2019-07-26 DIAGNOSIS — F0390 Unspecified dementia without behavioral disturbance: Secondary | ICD-10-CM | POA: Diagnosis present

## 2019-07-26 DIAGNOSIS — S82401D Unspecified fracture of shaft of right fibula, subsequent encounter for closed fracture with routine healing: Secondary | ICD-10-CM | POA: Diagnosis not present

## 2019-07-26 DIAGNOSIS — M479 Spondylosis, unspecified: Secondary | ICD-10-CM | POA: Diagnosis present

## 2019-07-26 DIAGNOSIS — S72142S Displaced intertrochanteric fracture of left femur, sequela: Secondary | ICD-10-CM | POA: Diagnosis not present

## 2019-07-26 DIAGNOSIS — Z419 Encounter for procedure for purposes other than remedying health state, unspecified: Secondary | ICD-10-CM

## 2019-07-26 DIAGNOSIS — Z01810 Encounter for preprocedural cardiovascular examination: Secondary | ICD-10-CM | POA: Diagnosis not present

## 2019-07-26 DIAGNOSIS — N1831 Chronic kidney disease, stage 3a: Secondary | ICD-10-CM | POA: Diagnosis present

## 2019-07-26 DIAGNOSIS — Z66 Do not resuscitate: Secondary | ICD-10-CM | POA: Diagnosis not present

## 2019-07-26 DIAGNOSIS — Z888 Allergy status to other drugs, medicaments and biological substances status: Secondary | ICD-10-CM

## 2019-07-26 DIAGNOSIS — R1312 Dysphagia, oropharyngeal phase: Secondary | ICD-10-CM | POA: Diagnosis not present

## 2019-07-26 DIAGNOSIS — R9431 Abnormal electrocardiogram [ECG] [EKG]: Secondary | ICD-10-CM | POA: Diagnosis present

## 2019-07-26 DIAGNOSIS — S60212A Contusion of left wrist, initial encounter: Secondary | ICD-10-CM | POA: Diagnosis not present

## 2019-07-26 DIAGNOSIS — S299XXA Unspecified injury of thorax, initial encounter: Secondary | ICD-10-CM | POA: Diagnosis not present

## 2019-07-26 DIAGNOSIS — K219 Gastro-esophageal reflux disease without esophagitis: Secondary | ICD-10-CM | POA: Diagnosis present

## 2019-07-26 DIAGNOSIS — S0990XA Unspecified injury of head, initial encounter: Secondary | ICD-10-CM | POA: Diagnosis not present

## 2019-07-26 DIAGNOSIS — I5043 Acute on chronic combined systolic (congestive) and diastolic (congestive) heart failure: Secondary | ICD-10-CM | POA: Diagnosis not present

## 2019-07-26 DIAGNOSIS — S60222A Contusion of left hand, initial encounter: Secondary | ICD-10-CM | POA: Diagnosis not present

## 2019-07-26 DIAGNOSIS — Z951 Presence of aortocoronary bypass graft: Secondary | ICD-10-CM | POA: Diagnosis not present

## 2019-07-26 DIAGNOSIS — R52 Pain, unspecified: Secondary | ICD-10-CM | POA: Diagnosis not present

## 2019-07-26 DIAGNOSIS — R519 Headache, unspecified: Secondary | ICD-10-CM | POA: Diagnosis not present

## 2019-07-26 DIAGNOSIS — R404 Transient alteration of awareness: Secondary | ICD-10-CM | POA: Diagnosis not present

## 2019-07-26 DIAGNOSIS — I5032 Chronic diastolic (congestive) heart failure: Secondary | ICD-10-CM | POA: Diagnosis not present

## 2019-07-26 DIAGNOSIS — Z950 Presence of cardiac pacemaker: Secondary | ICD-10-CM

## 2019-07-26 DIAGNOSIS — I4891 Unspecified atrial fibrillation: Secondary | ICD-10-CM | POA: Diagnosis not present

## 2019-07-26 DIAGNOSIS — E876 Hypokalemia: Secondary | ICD-10-CM | POA: Diagnosis present

## 2019-07-26 DIAGNOSIS — S199XXA Unspecified injury of neck, initial encounter: Secondary | ICD-10-CM | POA: Diagnosis not present

## 2019-07-26 DIAGNOSIS — Z8249 Family history of ischemic heart disease and other diseases of the circulatory system: Secondary | ICD-10-CM

## 2019-07-26 DIAGNOSIS — Z9861 Coronary angioplasty status: Secondary | ICD-10-CM | POA: Diagnosis not present

## 2019-07-26 DIAGNOSIS — Z96649 Presence of unspecified artificial hip joint: Secondary | ICD-10-CM

## 2019-07-26 DIAGNOSIS — Z87891 Personal history of nicotine dependence: Secondary | ICD-10-CM

## 2019-07-26 DIAGNOSIS — Z87440 Personal history of urinary (tract) infections: Secondary | ICD-10-CM

## 2019-07-26 DIAGNOSIS — G629 Polyneuropathy, unspecified: Secondary | ICD-10-CM | POA: Diagnosis not present

## 2019-07-26 DIAGNOSIS — Z03818 Encounter for observation for suspected exposure to other biological agents ruled out: Secondary | ICD-10-CM | POA: Diagnosis not present

## 2019-07-26 DIAGNOSIS — S72002A Fracture of unspecified part of neck of left femur, initial encounter for closed fracture: Secondary | ICD-10-CM | POA: Diagnosis not present

## 2019-07-26 DIAGNOSIS — I5031 Acute diastolic (congestive) heart failure: Secondary | ICD-10-CM | POA: Diagnosis not present

## 2019-07-26 LAB — CBC WITH DIFFERENTIAL/PLATELET
Abs Immature Granulocytes: 0.06 10*3/uL (ref 0.00–0.07)
Basophils Absolute: 0.1 10*3/uL (ref 0.0–0.1)
Basophils Relative: 0 %
Eosinophils Absolute: 0.1 10*3/uL (ref 0.0–0.5)
Eosinophils Relative: 1 %
HCT: 49.2 % — ABNORMAL HIGH (ref 36.0–46.0)
Hemoglobin: 15.4 g/dL — ABNORMAL HIGH (ref 12.0–15.0)
Immature Granulocytes: 0 %
Lymphocytes Relative: 11 %
Lymphs Abs: 1.5 10*3/uL (ref 0.7–4.0)
MCH: 28.1 pg (ref 26.0–34.0)
MCHC: 31.3 g/dL (ref 30.0–36.0)
MCV: 89.8 fL (ref 80.0–100.0)
Monocytes Absolute: 0.7 10*3/uL (ref 0.1–1.0)
Monocytes Relative: 5 %
Neutro Abs: 11.1 10*3/uL — ABNORMAL HIGH (ref 1.7–7.7)
Neutrophils Relative %: 83 %
Platelets: 231 10*3/uL (ref 150–400)
RBC: 5.48 MIL/uL — ABNORMAL HIGH (ref 3.87–5.11)
RDW: 14.9 % (ref 11.5–15.5)
WBC: 13.5 10*3/uL — ABNORMAL HIGH (ref 4.0–10.5)
nRBC: 0 % (ref 0.0–0.2)

## 2019-07-26 LAB — COMPREHENSIVE METABOLIC PANEL
ALT: 14 U/L (ref 0–44)
AST: 19 U/L (ref 15–41)
Albumin: 3.9 g/dL (ref 3.5–5.0)
Alkaline Phosphatase: 69 U/L (ref 38–126)
Anion gap: 12 (ref 5–15)
BUN: 14 mg/dL (ref 8–23)
CO2: 25 mmol/L (ref 22–32)
Calcium: 9.4 mg/dL (ref 8.9–10.3)
Chloride: 102 mmol/L (ref 98–111)
Creatinine, Ser: 0.9 mg/dL (ref 0.44–1.00)
GFR calc Af Amer: >60 mL/min (ref >60)
GFR calc non Af Amer: 55 mL/min — ABNORMAL LOW (ref >60)
Glucose, Bld: 145 mg/dL — ABNORMAL HIGH (ref 70–99)
Potassium: 3.5 mmol/L (ref 3.5–5.1)
Sodium: 139 mmol/L (ref 135–145)
Total Bilirubin: 1 mg/dL (ref 0.3–1.2)
Total Protein: 7 g/dL (ref 6.5–8.1)

## 2019-07-26 LAB — SARS CORONAVIRUS 2 BY RT PCR (HOSPITAL ORDER, PERFORMED IN ~~LOC~~ HOSPITAL LAB): SARS Coronavirus 2: NEGATIVE

## 2019-07-26 MED ORDER — METHOCARBAMOL 500 MG PO TABS
500.0000 mg | ORAL_TABLET | Freq: Four times a day (QID) | ORAL | Status: DC | PRN
  Administered 2019-07-28 – 2019-07-29 (×2): 500 mg via ORAL
  Filled 2019-07-26 (×2): qty 1

## 2019-07-26 MED ORDER — MEMANTINE HCL 10 MG PO TABS
10.0000 mg | ORAL_TABLET | Freq: Every day | ORAL | Status: DC
  Administered 2019-07-28 – 2019-08-02 (×6): 10 mg via ORAL
  Filled 2019-07-26 (×6): qty 1

## 2019-07-26 MED ORDER — METHOCARBAMOL 1000 MG/10ML IJ SOLN: 500.0000 mg | Freq: Four times a day (QID) | INTRAVENOUS | Status: DC | PRN

## 2019-07-26 MED ORDER — POVIDONE-IODINE 10 % EX SWAB: 2.0000 "application " | Freq: Once | CUTANEOUS | Status: DC

## 2019-07-26 MED ORDER — TRANEXAMIC ACID 1000 MG/10ML IV SOLN
2000.0000 mg | INTRAVENOUS | Status: DC
  Filled 2019-07-26: qty 20

## 2019-07-26 MED ORDER — HYDROCODONE-ACETAMINOPHEN 5-325 MG PO TABS
1.0000 | ORAL_TABLET | Freq: Four times a day (QID) | ORAL | Status: DC | PRN
  Administered 2019-07-27: 1 via ORAL
  Filled 2019-07-26: qty 1

## 2019-07-26 MED ORDER — FENTANYL CITRATE (PF) 100 MCG/2ML IJ SOLN
100.0000 ug | Freq: Once | INTRAMUSCULAR | Status: AC
  Administered 2019-07-26: 100 ug via INTRAVENOUS
  Filled 2019-07-26: qty 2

## 2019-07-26 MED ORDER — MORPHINE SULFATE (PF) 2 MG/ML IV SOLN: 0.5000 mg | INTRAVENOUS | Status: DC | PRN

## 2019-07-26 MED ORDER — METOPROLOL TARTRATE 25 MG PO TABS
12.5000 mg | ORAL_TABLET | Freq: Two times a day (BID) | ORAL | Status: DC
  Administered 2019-07-27 – 2019-08-02 (×14): 12.5 mg via ORAL
  Filled 2019-07-26 (×14): qty 1

## 2019-07-26 MED ORDER — CEFAZOLIN SODIUM-DEXTROSE 2-4 GM/100ML-% IV SOLN
2.0000 g | INTRAVENOUS | Status: AC
  Administered 2019-07-27: 2 g via INTRAVENOUS
  Filled 2019-07-26: qty 100

## 2019-07-26 MED ORDER — TRANEXAMIC ACID-NACL 1000-0.7 MG/100ML-% IV SOLN
1000.0000 mg | INTRAVENOUS | Status: AC
  Administered 2019-07-27: 1000 mg via INTRAVENOUS
  Filled 2019-07-26: qty 100

## 2019-07-26 MED ORDER — SODIUM CHLORIDE 0.9 % IV SOLN: INTRAVENOUS | Status: AC

## 2019-07-26 MED ORDER — ENSURE PRE-SURGERY PO LIQD
296.0000 mL | Freq: Once | ORAL | Status: DC
  Filled 2019-07-26: qty 296

## 2019-07-26 NOTE — ED Notes (Signed)
Patient's HCPOA alerted of patient status. MD speaking to [facility] at this time

## 2019-07-26 NOTE — ED Triage Notes (Signed)
Per EMS: [name] coming from [place] with a hx of unwitnessed falls. Patient reports left hip pain. Patient was seen in the past week for the same.  Patient has a bruise to the left hip per EMS and was found laying on the floor on her left hip. Patient at baseline for her dementia.

## 2019-07-26 NOTE — H&P (Signed)
Cheryl Zamora:[id number] DOB: 1923/05/10 DOA: 07/26/2019    PCP: Cheryl Roche, NP   Outpatient Specialists:  CARDS:  Dr.McLean    Patient arrived to ER on 07/26/19 at 1700  Patient coming from:   From facility [place] facility.  Chief Complaint:   Chief Complaint  Patient presents with  . Fall    HPI: Cheryl Zamora is a 86 y.o. female with medical history significant of dementia  a.fib not on anticoagulation, hx of CAD sp CABG, hypertension CKD renal calculus, pulmonary nodule, sick sinus syndrome with pacemaker, chronic diastolic CHF  Presented with   Unwitnessed fall Originally seen on 22 May for unwitnessed fall out of bed at that time no fracture was found and she was discharged back to nursing home facility.  Today she was found again on the floor on her left side patient has significant dementia at baseline she does not recollect how she fell   Infectious risk factors:  Reports none     In  ER  COVID TEST  NEGATIVE   Lab Results  Component Value Date   [place] NEGATIVE 07/26/2019   [place] NEGATIVE 03/26/2019   [place] NEGATIVE 03/23/2019    Regarding pertinent Chronic problems:       HTN on metoprolol   chronic CHF diastolic  - last echo 24-23%     CAD  - On   betablocker,              A. Fib -  - CHA2DS2 vas score >4:   Not on anticoagulation secondary to Risk of Falls, and recurrent bleeding in urine        -  Rate control:  Currently controlled with  Metoprolol,  Does not tolerate amiodarone  CKD stage IIIb - baseline Cr 1.0 Lab Results  Component Value Date   CREATININE 0.90 07/26/2019   CREATININE 0.92 03/29/2019   CREATININE 1.05 (H) 03/28/2019   History of pulmonary nodule found in January with bilateral adrenal masses patient's family stated that they will not interested in work-up at that time    While in ER: Imaging showed acute left intertrochanteric fracture   ER Provider Called:  Orthopedics   Dr.Xu    They Recommend admit to medicine  To [facility] Will see in AM  Hospitalist was called for admission for left hip fracture  The following Work up has been ordered so far:  Orders Placed This Encounter  Procedures  . SARS Coronavirus 2 by RT PCR (hospital order, performed in [facility] hospital lab) Nasopharyngeal Nasopharyngeal Swab  . DG Hip Unilat W or Wo Pelvis 2-3 Views Left  . CT Head Wo Contrast  . CT Cervical Spine Wo Contrast  . DG Chest Portable 1 View  . DG Thoracic Spine 2 View  . DG Hand Complete Left  . DG Wrist Complete Left  . CBC with Differential  . Comprehensive metabolic panel  . Urinalysis, Routine w reflex microscopic  . Consult to orthopedic surgery  ALL PATIENTS BEING ADMITTED/HAVING PROCEDURES NEED COVID-19 SCREENING  . Consult to hospitalist  ALL PATIENTS BEING ADMITTED/HAVING PROCEDURES NEED COVID-19 SCREENING  . ED EKG    Following Medications were ordered in ER: Medications  fentaNYL (SUBLIMAZE) injection 100 mcg (100 mcg Intravenous Given 07/26/19 1926)        Consult Orders  (From admission, onward)         Start     Ordered   07/26/19 2051  Consult to hospitalist  ALL PATIENTS BEING ADMITTED/HAVING PROCEDURES NEED COVID-19 SCREENING  Once    Comments: ALL PATIENTS BEING ADMITTED/HAVING PROCEDURES NEED COVID-19 SCREENING  Provider:  (Not yet assigned)  Question Answer Comment  Place call to: [facility]   Reason for Consult Admit      07/26/19 2050            Significant initial  Findings: Abnormal Labs Reviewed  CBC WITH DIFFERENTIAL/PLATELET - Abnormal; Notable for the following components:      Result Value   WBC 13.5 (*)    RBC 5.48 (*)    Hemoglobin 15.4 (*)    HCT 49.2 (*)    Neutro Abs 11.1 (*)    All other components within normal limits  COMPREHENSIVE METABOLIC PANEL - Abnormal; Notable for the following components:   Glucose, Bld 145 (*)    GFR calc non Af Amer 55 (*)    All other components within normal limits      Otherwise labs showing:    Recent Labs  Lab 07/26/19 1900  NA 139  K 3.5  CO2 25  GLUCOSE 145*  BUN 14  CREATININE 0.90  CALCIUM 9.4    Cr   Stable,  Lab Results  Component Value Date   CREATININE 0.90 07/26/2019   CREATININE 0.92 03/29/2019   CREATININE 1.05 (H) 03/28/2019    Recent Labs  Lab 07/26/19 1900  AST 19  ALT 14  ALKPHOS 69  BILITOT 1.0  PROT 7.0  ALBUMIN 3.9   Lab Results  Component Value Date   CALCIUM 9.4 07/26/2019      WBC      Component Value Date/Time   WBC 13.5 (H) 07/26/2019 1900   ANC    Component Value Date/Time   NEUTROABS 11.1 (H) 07/26/2019 1900   NEUTROABS 6.5 04/08/2017 1319   ALC No components found for: LYMPHAB    Plt: Lab Results  Component Value Date   PLT 231 07/26/2019     Lactic Acid, Venous    Component Value Date/Time   LATICACIDVEN 1.84 01/02/2015 1604    No results for input(s): DDIMER, FERRITIN, LDH, CRP in the last 72 hours.  Lab Results  Component Value Date   SARSCOV2NAA NEGATIVE 07/26/2019   SARSCOV2NAA NEGATIVE 03/26/2019   [place] NEGATIVE 03/23/2019      HG/HCT  Up from baseline      Component Value Date/Time   HGB 15.4 (H) 07/26/2019 1900   HGB 14.7 04/08/2017 1319   HCT 49.2 (H) 07/26/2019 1900   HCT 43.7 04/08/2017 1319       ECG: Ordered Personally reviewed by me showing: HR : 73 Rhythm: A.fib.  Paced  no evidence of ischemic changes [id number]    UA   ordered   Urine analysis:    Component Value Date/Time   COLORURINE RED (A) 03/26/2019 1503   APPEARANCEUR CLOUDY (A) 03/26/2019 1503   LABSPEC 1.015 03/26/2019 1503   PHURINE 6.0 03/26/2019 1503   GLUCOSEU NEGATIVE 03/26/2019 1503   HGBUR MODERATE (A) 03/26/2019 1503   BILIRUBINUR NEGATIVE 03/26/2019 1503   KETONESUR NEGATIVE 03/26/2019 1503   PROTEINUR 100 (A) 03/26/2019 1503   UROBILINOGEN 0.2 01/13/2015 0058   NITRITE NEGATIVE 03/26/2019 1503   LEUKOCYTESUR NEGATIVE 03/26/2019 1503      Ordered  CT  HEAD  NON acute  CXR - NON acute  Left hip -    Acute left intertrochanteric fracture  ED Triage Vitals [07/26/19 1710]  Enc Vitals Group  BP (!) 144/97     Pulse Rate 75     Resp 16     Temp 98 F (36.7 C)     Temp Source Oral     SpO2 100 %     Weight      Height      Head Circumference      Peak Flow      Pain Score      Pain Loc      Pain Edu?      Excl. in [place]?   IRJJ(88)@       Latest  Blood pressure (!) 144/97, pulse 75, temperature 98 F (36.7 C), temperature source Oral, resp. rate 16, SpO2 100 %.    Review of Systems:    Pertinent positives include: left hip pain   Constitutional:  No weight loss, night sweats, Fevers, chills, fatigue, weight loss  HEENT:  No headaches, Difficulty swallowing,Tooth/dental problems,Sore throat,  No sneezing, itching, ear ache, nasal congestion, post nasal drip,  Cardio-vascular:  No chest pain, Orthopnea, PND, anasarca, dizziness, palpitations.no Bilateral lower extremity swelling  GI:  No heartburn, indigestion, abdominal pain, nausea, vomiting, diarrhea, change in bowel habits, loss of appetite, melena, blood in stool, hematemesis Resp:  no shortness of breath at rest. No dyspnea on exertion, No excess mucus, no productive cough, No non-productive cough, No coughing up of blood.No change in color of mucus.No wheezing. Skin:  no rash or lesions. No jaundice GU:  no dysuria, change in color of urine, no urgency or frequency. No straining to urinate.  No flank pain.  Musculoskeletal:  No joint pain or no joint swelling. No decreased range of motion. No back pain.  Psych:  No change in mood or affect. No depression or anxiety. No memory loss.  Neuro: no localizing neurological complaints, no tingling, no weakness, no double vision, no gait abnormality, no slurred speech, no confusion  All systems reviewed and apart from [place] all are negative  Past Medical History:   Past Medical History:  Diagnosis Date  . Atrial  fibrillation with rapid ventricular response ([place]) 11/2012; 10/2013   a. s/p TEE/DCCV 11/2012 & 10/2013 b. amiodarone caused significant QT prlongation (>643msec)  . Chronic combined systolic and diastolic CHF (congestive heart failure) ([place])   . Coronary artery disease 2005   a. s/p CABG (2012)  . GERD (gastroesophageal reflux disease)   . Hyperlipidemia    pt denies this hx on 01/03/2015  . Hypertension   . Neuropathy   . Osteoarthritis of back       Past Surgical History:  Procedure Laterality Date  . ABDOMINAL HYSTERECTOMY    . AV NODE ABLATION N/A 11/16/2016   Procedure: AV Node Ablation;  Surgeon: Constance Haw, MD;  Location: [place] [facility];  Service: Cardiovascular;  Laterality: N/A;  . BACK SURGERY    . CARDIAC CATHETERIZATION  04/12/2003   [place] 60, LAD 90, D1 30, CFX 95->T, RCA OK  . CARDIOVERSION N/A 12/13/2012   Procedure: CARDIOVERSION;  Surgeon: Larey Dresser, MD;  Location: [facility] ENDOSCOPY;  Service: Cardiovascular;  Laterality: N/A;  . CARDIOVERSION N/A 11/13/2013   Procedure: CARDIOVERSION;  Surgeon: Carisma Spark, MD;  Location: [facility] ENDOSCOPY;  Service: Cardiovascular;  Laterality: N/A;  . CARDIOVERSION N/A 04/12/2014   Procedure: CARDIOVERSION;  Surgeon: Thayer Headings, MD;  Location: [facility] ENDOSCOPY;  Service: Cardiovascular;  Laterality: N/A;  . CARDIOVERSION N/A 01/06/2015   Procedure: CARDIOVERSION;  Surgeon: Larey Dresser, MD;  Location:  [place] ENDOSCOPY;  Service: Cardiovascular;  Laterality: N/A;  . CARDIOVERSION N/A 11/11/2016   Procedure: CARDIOVERSION;  Surgeon: Josue Hector, MD;  Location: [facility] ENDOSCOPY;  Service: Cardiovascular;  Laterality: N/A;  . CERVICAL DISC SURGERY  1960's   "ruptured disc'  . CORONARY ANGIOPLASTY    . CORONARY ARTERY BYPASS GRAFT  04/18/2003   x 4; LIMA-LAD, SVG-D1, SVG-OM1-OM2  . PACEMAKER IMPLANT N/A 11/16/2016   Procedure: Pacemaker Implant;  Surgeon: Constance Haw, MD;  Location: [place] [facility];  Service:  Cardiovascular;  Laterality: N/A;  . TEE WITHOUT CARDIOVERSION N/A 12/13/2012   Procedure: TRANSESOPHAGEAL ECHOCARDIOGRAM (TEE);  Surgeon: Larey Dresser, MD;  Location: [facility] ENDOSCOPY;  Service: Cardiovascular;  Laterality: N/A;  . TEE WITHOUT CARDIOVERSION N/A 11/13/2013   Procedure: TRANSESOPHAGEAL ECHOCARDIOGRAM (TEE);  Surgeon: Mallarie Spark, MD;  Location: [place];  Service: Cardiovascular;  Laterality: N/A;  . TEE WITHOUT CARDIOVERSION N/A 11/11/2016   Procedure: TRANSESOPHAGEAL ECHOCARDIOGRAM (TEE);  Surgeon: Josue Hector, MD;  Location: [facility] ENDOSCOPY;  Service: Cardiovascular;  Laterality: N/A;  . TONSILLECTOMY      Social History:  Ambulatory   wheelchair bound,     reports that she has quit smoking. Her smoking use included cigarettes. She has a 60.00 pack-year smoking history. She has never used smokeless tobacco. She reports current alcohol use. She reports that she does not use drugs.   Family History:   Family History  Problem Relation Age of Onset  . Heart attack Mother   . Heart attack Father   . Heart attack Brother   . Heart attack Brother   . Heart attack Brother   . Heart attack Brother     Allergies: Allergies  Allergen Reactions  . Digoxin And Related Other (See Comments)    Increased HR  . Amiodarone Other (See Comments)    Excessive QT prolongation, nausea  . Codeine Nausea And Vomiting  . Penicillins Nausea Only    Did it involve swelling of the face/tongue/throat, SOB, or low BP? Unknown Did it involve sudden or severe rash/hives, skin peeling, or any reaction on the inside of your mouth or nose? Unknown Did you need to seek medical attention at a hospital or doctor's office? Unknown When did it last happen? If all above answers are "NO", may proceed with cephalosporin use.     Prior to Admission medications   Medication Sig Start Date End Date Taking? Authorizing Provider  acetaminophen (TYLENOL) 325 MG tablet Take 650 mg by  mouth every 6 (six) hours as needed for mild pain.    [provider]  Amino Acids-Protein Hydrolys (FEEDING SUPPLEMENT, PRO-STAT SUGAR FREE 64,) LIQD Take 30 mLs by mouth daily.    [provider]  Cholecalciferol (VITAMIN D3) 1000 units CAPS Take 1,000 Units by mouth daily.    [provider]  memantine (NAMENDA) 10 MG tablet Take 10 mg by mouth daily.     [provider]  metoprolol tartrate (LOPRESSOR) 25 MG tablet Take 0.5 tablets (12.5 mg total) by mouth 2 (two) times daily. 03/29/19   Nita Sells, MD  Multiple Vitamin (MULTIVITAMIN) tablet Take 1 tablet by mouth daily.    [provider]   Physical Exam: Blood pressure (!) 144/97, pulse 75, temperature 98 F (36.7 C), temperature source Oral, resp. rate 16, SpO2 100 %. 1. General:  in No  Acute distress   Chronically ill  -appearing 2. Psychological: Alert but not Oriented 3. Head/ENT:    Dry Mucous  Membranes                          Head Non traumatic, neck supple                            Poor Dentition 4. SKIN: n  decreased Skin turgor,  Skin clean Dry and intact no rash 5. Heart: Regular rate and rhythm no  Murmur, no Rub or gallop 6. Lungs:  no wheezes or crackles   7. Abdomen: Soft,  non-tender, Non distended  bowel sounds present 8. Lower extremities: no clubbing, cyanosis, no  edema 9. Neurologically Grossly intact, moving all 4 extremities equally  10. MSK: Normal range of motion limited on left side   All other LABS:     Recent Labs  Lab 07/26/19 1900  WBC 13.5*  NEUTROABS 11.1*  HGB 15.4*  HCT 49.2*  MCV 89.8  PLT 231     Recent Labs  Lab 07/26/19 1900  NA 139  K 3.5  CL 102  CO2 25  GLUCOSE 145*  BUN 14  CREATININE 0.90  CALCIUM 9.4     Recent Labs  Lab 07/26/19 1900  AST 19  ALT 14  ALKPHOS 69  BILITOT 1.0  PROT 7.0  ALBUMIN 3.9       Cultures:    Component Value Date/Time   SDES  03/26/2019 1502    URINE, CLEAN  CATCH Performed at [facility], [place] [address], [place], [place] [address]    SPECREQUEST  03/26/2019 1502    NONE Performed at [facility], [place] [address], [place], [place] [address]    CULT  03/26/2019 1502    NO GROWTH Performed at [facility], [place] [address] [place], [place] [address]    REPTSTATUS 03/27/2019 FINAL 03/26/2019 1502     Radiological Exams on Admission: DG Thoracic Spine 2 View  Result Date: 07/26/2019 CLINICAL DATA:  Un witnessed fall, found down EXAM: THORACIC SPINE 2 VIEWS COMPARISON:  07/21/2019 FINDINGS: Frontal and cross-table lateral views of the thoracic spine are obtained. Lateral views are suboptimal due to positioning and overlying structures. Stable right convex curvature centered in the lower thoracic spine. Otherwise alignment appears anatomic. There is extensive multilevel thoracic spondylosis unchanged. No evidence of acute fracture. IMPRESSION: 1. No gross displaced fracture. Evaluation limited by technique and positioning. Electronically Signed   By: Randa Ngo M.D.   On: 07/26/2019 19:12   CT Head Wo Contrast  Result Date: 07/26/2019 CLINICAL DATA:  Posttraumatic headache after fall. EXAM: CT HEAD WITHOUT CONTRAST CT CERVICAL SPINE WITHOUT CONTRAST TECHNIQUE: Multidetector CT imaging of the head and cervical spine was performed following the standard protocol without intravenous contrast. Multiplanar CT image reconstructions of the cervical spine were also generated. COMPARISON:  Jul 21, 2019. FINDINGS: CT HEAD FINDINGS Brain: Mild diffuse cortical atrophy is noted. Mild chronic ischemic white matter disease is noted. No mass effect or midline shift is noted. Ventricular size is within normal limits. There is no evidence of mass lesion, hemorrhage or acute infarction. Vascular: No hyperdense vessel or unexpected calcification. Skull: Normal. Negative for fracture or focal lesion. Sinuses/Orbits: No acute  finding. Other: Small left frontal scalp hematoma is noted. CT CERVICAL SPINE FINDINGS Alignment: Mild grade 1 anterolisthesis of C4-5 is noted secondary to posterior facet joint hypertrophy. Skull base and vertebrae: No acute fracture. No primary bone lesion or  focal pathologic process. Soft tissues and spinal canal: No prevertebral fluid or swelling. No visible canal hematoma. Disc levels: Severe degenerative disc disease is noted at C5-6, C6-7 and C7-T1 with anterior and posterior osteophyte formation. Upper chest: Negative. Other: Degenerative changes are seen involving posterior facet joints bilaterally. IMPRESSION: 1. Mild diffuse cortical atrophy. Mild chronic ischemic white matter disease. Small left frontal scalp hematoma. No acute intracranial abnormality seen. 2. Severe multilevel degenerative disc disease. No acute abnormality seen in the cervical spine. Electronically Signed   By: Marijo Conception M.D.   On: 07/26/2019 18:33   CT Cervical Spine Wo Contrast  Result Date: 07/26/2019 CLINICAL DATA:  Posttraumatic headache after fall. EXAM: CT HEAD WITHOUT CONTRAST CT CERVICAL SPINE WITHOUT CONTRAST TECHNIQUE: Multidetector CT imaging of the head and cervical spine was performed following the standard protocol without intravenous contrast. Multiplanar CT image reconstructions of the cervical spine were also generated. COMPARISON:  Jul 21, 2019. FINDINGS: CT HEAD FINDINGS Brain: Mild diffuse cortical atrophy is noted. Mild chronic ischemic white matter disease is noted. No mass effect or midline shift is noted. Ventricular size is within normal limits. There is no evidence of mass lesion, hemorrhage or acute infarction. Vascular: No hyperdense vessel or unexpected calcification. Skull: Normal. Negative for fracture or focal lesion. Sinuses/Orbits: No acute finding. Other: Small left frontal scalp hematoma is noted. CT CERVICAL SPINE FINDINGS Alignment: Mild grade 1 anterolisthesis of C4-5 is noted  secondary to posterior facet joint hypertrophy. Skull base and vertebrae: No acute fracture. No primary bone lesion or focal pathologic process. Soft tissues and spinal canal: No prevertebral fluid or swelling. No visible canal hematoma. Disc levels: Severe degenerative disc disease is noted at C5-6, C6-7 and C7-T1 with anterior and posterior osteophyte formation. Upper chest: Negative. Other: Degenerative changes are seen involving posterior facet joints bilaterally. IMPRESSION: 1. Mild diffuse cortical atrophy. Mild chronic ischemic white matter disease. Small left frontal scalp hematoma. No acute intracranial abnormality seen. 2. Severe multilevel degenerative disc disease. No acute abnormality seen in the cervical spine. Electronically Signed   By: Marijo Conception M.D.   On: 07/26/2019 18:33   DG Chest Portable 1 View  Result Date: 07/26/2019 CLINICAL DATA:  Hip fracture.  Screening.  Unwitnessed cough. EXAM: PORTABLE CHEST 1 VIEW COMPARISON:  Radiograph 03/24/2019, CT 03/26/2019 FINDINGS: Single lead left-sided pacemaker. Median sternotomy and CABG. Mild cardiomegaly is chronic and unchanged. Aortic atherosclerosis. Mild chronic peribronchial thickening. No focal airspace disease. No pleural fluid or pneumothorax. No pulmonary edema. The bones are under mineralized. No acute osseous abnormalities are seen. Right lower rib fractures have surrounding callus formation and appear at least subacute. IMPRESSION: 1. No acute chest findings. 2. Post CABG with mild chronic cardiomegaly. 3. Right lower rib fractures have surrounding callus formation and appear at least subacute. Electronically Signed   By: Keith Rake M.D.   On: 07/26/2019 19:14   DG Hip Unilat W or Wo Pelvis 2-3 Views Left  Result Date: 07/26/2019 CLINICAL DATA:  Left-sided hip pain after fall EXAM: DG HIP (WITH OR WITHOUT PELVIS) 2-3V LEFT COMPARISON:  07/21/2019 FINDINGS: Acute mildly comminuted left intertrochanteric fracture without  angulation. Femoral head is normally aligned. Pubic symphysis and rami are intact. IMPRESSION: Acute left intertrochanteric fracture Electronically Signed   By: Donavan Foil M.D.   On: 07/26/2019 18:13    Chart has been reviewed    Assessment/Plan   86 y.o. female with medical history significant of dementia  a.fib not on anticoagulation, hx  of CAD sp CABG, hypertension CKD renal calculus, pulmonary nodule, sick sinus syndrome with pacemaker, chronic diastolic CHF Admitted for left hip fracture  Present on Admission: . Closed left hip fracture ([place]) -  management as per orthopedics,  plan to operate  in  a.m.     Keep nothing by mouth post midnight. Patient not on anticoagulation or antiplatelet agents    Ordered type and screen,   order a vitamin D level  Patient at baseline unable to walk a flight of stairs or 100 feet   due to  generalize fatigue and deconditioning   Patient denies any chest pain or shortness of breath currently and/or with exertion,    ECG showing no evidence of acute ischemia   known history of coronary artery disease, CKD  Given advanced age patient is at least moderate  Risk which has been discussed with family but at this point no further ischemic cardiac workup is indicated.    will order echo     Cardiology consult for further pre-op clearance given extensive cardiac disease    . Essential hypertension for now continue metoprolol  . CAD, multiple vessel, hx CABG 2012 -chronic stable patient not reporting any chest pain or shortness of breath   . PAF (paroxysmal atrial fibrillation) (H[facility])-not on anticoagulation given risk of falls as well as hematuria.  Continue metoprolol blood pressure well   . Chronic diastolic CHF (congestive heart failure), NYHA class 3 ([place]) -currently appears to be euvolemic avoid over aggressive fluids Appreciate cardiology input regarding perioperative cardiac monitoring and management   . CKD (chronic kidney disease) stage  3, GFR 30-59 ml/min -chronic stable continue to monitor   . Nodule of middle lobe of right lung in the past family were not interested in pursuing  . Leukocytosis -most likely stress reaction  Right lower lobe fractures possibly subacute patient with multiple falls.  Would benefit from incentive spirometer monitor for any evidence of pneumonia postoperatively supportive management  Dementia chronic expect some degree of sundowning while hospitalized   Other plan as per orders.  DVT prophylaxis:  SCD      Code Status:     DNR/DNI  s per  family  I had personally discussed CODE STATUS with   Family   Family Communication:   Family not at  Bedside  plan of care was discussed on the phone with nephew  Disposition Plan:   Back to facility  Following barriers for discharge:                                                        Pain controlled with PO medications                    Likely operative intervention for hip repair                           Will need consultants to evaluate patient prior to discharge                     Would benefit from PT/OT eval prior to DC                      Swallow eval - SLP ordered  Transition of care consulted                                      Consults called: orthopedics aware, emailed cardiology  Admission status:  ED Disposition    ED Disposition Condition [place]: [place] [[id number]]  Level of Care: Telemetry Medical [[id number]]  May admit patient to [facility] or [facility] if equivalent level of care is available:: No  Covid Evaluation: Confirmed COVID Negative  Diagnosis: Closed left hip fracture [facility]) [[id number]]  Admitting Physician: Toy Baker [[id number]]  Attending Physician: Toy Baker [[id number]]  Estimated length of stay: 3 - 4 days  Certification:: I certify this patient will need inpatient services for at least 2 midnights          inpatient      I Expect 2 midnight stay secondary to severity of patient's current illness need for inpatient interventions justified by the following:    Severe lab/radiological/exam abnormalities including:    left hip fracture and extensive comorbidities including:      CHF  CAD    CKD  dementia   That are currently affecting medical management.   I expect  patient to be hospitalized for 2 midnights requiring inpatient medical care.  [name] at high risk for adverse outcome (such as loss of life or disability) if not treated.  Indication for inpatient stay as follows:    severe pain requiring acute inpatient management,  inability to maintain oral hydration    Need for operative/procedural  intervention    Need for , IV fluids  IV pain medications       Level of care    tele  For 12H   Precautions: admitted as   Covid Negative     PPE: Used by the provider:   [id number]  eye Goggles,  Gloves    Friend Dorfman 07/26/2019, 11:50 PM    [facility]     after 2 AM please page floor coverage PA If 7AM-7PM, please contact the day team taking care of the patient using [employer]   Patient was evaluated in the context of the global COVID-19 pandemic, which necessitated consideration that the patient might be at risk for infection with the SARS-CoV-2 virus that causes COVID-19. Institutional protocols and algorithms that pertain to the evaluation of patients at risk for COVID-19 are in a state of rapid change based on information released by regulatory bodies including the CDC and federal and state organizations. These policies and algorithms were followed during the patient's care.

## 2019-07-26 NOTE — ED Provider Notes (Signed)
[facility] DEPT Provider Note   CSN: [id number] Arrival date & time: 07/26/19  1700     History Chief Complaint  Patient presents with  . Fall    Cheryl Zamora is a 86 y.o. female.  HPI      86 year old female with a history of atrial fibrillation not on anticoagulation, hypertension, hyperlipidemia, CAD status post CABG, dementia, who presents from [facility] with concern for fall and left hip pain.  She is mostly wheelchair and bedbound.  She had a recent fall on the 21st and presented to the emergency department, and today presents again after being found laying down on her left side.  On my history, she does not remember the fall until reminded.  History is limited by her dementia.  Reports 3 lacerations to her left hand, left hand and wrist pain as well as left hip pain.  Also acknowledges headache, neck pain, and mid back pain on history.  Denies numbness, weakness.  Denies any other concerns in terms of recent illness, nausea, vomiting.  The pain in the hip is severe.  Hx limited by dementia  Past Medical History:  Diagnosis Date  . Atrial fibrillation with rapid ventricular response ([place]) 11/2012; 10/2013   a. s/p TEE/DCCV 11/2012 & 10/2013 b. amiodarone caused significant QT prlongation (>655msec)  . Chronic combined systolic and diastolic CHF (congestive heart failure) ([place])   . Coronary artery disease 2005   a. s/p CABG (2012)  . GERD (gastroesophageal reflux disease)   . Hyperlipidemia    pt denies this hx on 01/03/2015  . Hypertension   . Neuropathy   . Osteoarthritis of back     Patient Active Problem List   Diagnosis Date Noted  . Closed left hip fracture ([place]) 07/26/2019  . Hydronephrosis of left kidney 03/26/2019  . Staghorn renal calculus 03/26/2019  . Nodule of middle lobe of right lung 03/26/2019  . Palliative care by specialist   . DNR (do not resuscitate) discussion   . Persistent atrial fibrillation  ([place])   . CHF (congestive heart failure) ([place]) 11/09/2016  . DVT (deep venous thrombosis) ([place]) 01/13/2015  . Cellulitis of both lower extremities 01/13/2015  . Cellulitis 01/13/2015  . Acute deep vein thrombosis (DVT) of popliteal vein of left lower extremity ([place])   . Hematuria   . Acute on chronic renal insufficiency 01/06/2015  . Atrial fibrillation with RVR ([place])   . Sepsis ([place]) 01/02/2015  . UTI (lower urinary tract infection) 01/02/2015  . Paroxysmal atrial fibrillation ([facility])   . Chronic diastolic (congestive) heart failure ([place])   . Atrial fibrillation with rapid ventricular response ([place])   . Acute on chronic combined systolic and diastolic congestive heart failure ([place]) 04/12/2014  . CKD (chronic kidney disease) stage 3, GFR 30-59 ml/min 04/11/2014  . CAD, multiple vessel, hx CABG 2012 04/11/2014  . Long term current use of anticoagulant therapy 12/21/2012  . Physical deconditioning 12/17/2012  . PAF (paroxysmal atrial fibrillation) ([place]) 12/12/2012  . Chronic diastolic CHF (congestive heart failure), NYHA class 3 ([place]) 12/12/2012  . Essential hypertension   . Neuropathy     Past Surgical History:  Procedure Laterality Date  . ABDOMINAL HYSTERECTOMY    . AV NODE ABLATION N/A 11/16/2016   Procedure: AV Node Ablation;  Surgeon: Constance Haw, MD;  Location: [facility];  Service: Cardiovascular;  Laterality: N/A;  . BACK SURGERY    . CARDIAC CATHETERIZATION  04/12/2003   [place] 45, LAD  90, D1 30, CFX 95->T, RCA OK  . CARDIOVERSION N/A 12/13/2012   Procedure: CARDIOVERSION;  Surgeon: Larey Dresser, MD;  Location: [facility];  Service: Cardiovascular;  Laterality: N/A;  . CARDIOVERSION N/A 11/13/2013   Procedure: CARDIOVERSION;  Surgeon: Calley Spark, MD;  Location[facility] END[facility]COPY;  Service: Cardiovascular;  Laterality: N/A;  . CARDIOVERSION N/A 04/12/2014   Procedure: CARDIOVERSION;  Surgeon: Thayer Headings, MD;  Location: [facility] ENDOSCOPY;  Service:  Cardiovascular;  Laterality: N/A;  . CARDIOVERSION N/A 01/06/2015   Procedure: CARDIOVERSION;  Surgeon: Larey Dresser, MD;  Location: [facility];  Service: Cardiovascular;  Laterality: N/A;  . CARDIOVERSION N/A 11/11/2016   Procedure: CARDIOVERSION;  Surgeon: Josue Hector, MD;  Location: [facility] ENDOSCOPY;  Service: Cardiovascular;  Laterality: N/A;  . [place]  1960's   "ruptured disc'  . CORONARY ANGIOPLASTY    . CORONARY ARTERY BYPASS GRAFT  04/18/2003   x 4; LIMA-LAD, SVG-D1, SVG-OM1-OM2  . PACEMAKER IMPLANT N/A 11/16/2016   Procedure: Pacemaker Implant;  Surgeon: Constance Haw, MD;  Location: [facility];  Service: Cardiovascular;  Laterality: N/A;  . TEE WITHOUT CARDIOVERSION N/A 12/13/2012   Procedure: TRANSESOPHAGEAL ECHOCARDIOGRAM (TEE);  Surgeon: Larey Dresser, MD;  Location: [facility] ENDOSCOPY;  Service: Cardiovascular;  Laterality: N/A;  . TEE WITHOUT CARDIOVERSION N/A 11/13/2013   Procedure: TRANSESOPHAGEAL ECHOCARDIOGRAM (TEE);  Surgeon: Viviene Spark, MD;  Location: [facility];  Service: Cardiovascular;  Laterality: N/A;  . TEE WITHOUT CARDIOVERSION N/A 11/11/2016   Procedure: TRANSESOPHAGEAL ECHOCARDIOGRAM (TEE);  Surgeon: Josue Hector, MD;  Location: [facility] ENDOSCOPY;  Service: Cardiovascular;  Laterality: N/A;  . TONSILLECTOMY       OB History   No obstetric history on file.     Family History  Problem Relation Age of Onset  . Heart attack Mother   . Heart attack Father   . Heart attack Brother   . Heart attack Brother   . Heart attack Brother   . Heart attack Brother     Social History   Tobacco Use  . Smoking status: Former Smoker    Packs/day: 1.00    Years: 60.00    Pack years: 60.00    Types: Cigarettes  . Smokeless tobacco: Never Used  Substance Use Topics  . Alcohol use: Yes    Comment: 01/03/2015 "might have a beer a couple times/yr"  . Drug use: No    Home Medications Prior to Admission medications   Medication Sig  Start Date End Date Taking? Authorizing Provider  Amino Acids-Protein Hydrolys (FEEDING SUPPLEMENT, PRO-STAT SUGAR FREE 64,) LIQD Take 30 mLs by mouth daily.   Yes [provider]  Cholecalciferol (VITAMIN D3) 1000 units CAPS Take 1,000 Units by mouth daily.   Yes [provider]  memantine (NAMENDA) 10 MG tablet Take 10 mg by mouth daily.    Yes [provider]  metoprolol tartrate (LOPRESSOR) 25 MG tablet Take 0.5 tablets (12.5 mg total) by mouth 2 (two) times daily. 03/29/19  Yes Nita Sells, MD  Multiple Vitamin (MULTIVITAMIN) tablet Take 1 tablet by mouth daily.   Yes [provider]  nystatin (NYSTATIN) powder Apply 1 application topically every evening.   Yes [provider]  acetaminophen (TYLENOL) 325 MG tablet Take 650 mg by mouth every 6 (six) hours as needed for mild pain.    [provider]    Allergies    Digoxin and related, Amiodarone, Codeine, and Penicillins  Review of Systems  Review of Systems  Unable to perform ROS: Dementia  Constitutional: Negative for fever.  HENT: Negative for sore throat.   Eyes: Negative for visual disturbance.  Respiratory: Negative for cough and shortness of breath.   Cardiovascular: Negative for chest pain.  Gastrointestinal: Negative for abdominal pain, nausea and vomiting.  Genitourinary: Negative for difficulty urinating.  Musculoskeletal: Positive for arthralgias, back pain and neck pain.  Skin: Positive for wound. Negative for rash.  Neurological: Positive for headaches. Negative for syncope, weakness and numbness.    Physical Exam Updated Vital Signs BP (!) 144/97 (BP Location: Left Arm)   Pulse 75   Temp 98 F (36.7 C) (Oral)   Resp 16   SpO2 100%   Physical Exam Vitals and nursing note reviewed.  Constitutional:      General: She is not in acute distress.    Appearance: She is well-developed. She is not diaphoretic.  HENT:     Head: Normocephalic.      Comments: Left frontal/temporal contusion Eyes:     Conjunctiva/sclera: Conjunctivae normal.  Cardiovascular:     Rate and Rhythm: Normal rate and regular rhythm.     Heart sounds: Normal heart sounds. No murmur. No friction rub. No gallop.   Pulmonary:     Effort: Pulmonary effort is normal. No respiratory distress.     Breath sounds: Normal breath sounds. No wheezing or rales.  Abdominal:     General: There is no distension.     Palpations: Abdomen is soft.     Tenderness: There is no abdominal tenderness. There is no guarding.  Musculoskeletal:        General: No tenderness.     Cervical back: Normal range of motion.     Comments: Shortening and external rotation of LLE NOrmal pulses and sensation Tenderness L hand around areas of skin tears No back tenderness +CSpine tenderness  Skin:    General: Skin is warm and dry.     Findings: No erythema or rash.     Comments: Skin tears x3 to left hand  Neurological:     Mental Status: She is alert.     Comments: Oriented to self, hospital ([facility] or [facility]?), not date     ED Results / Procedures / Treatments   Labs (all labs ordered are listed, but only abnormal results are displayed) Labs Reviewed  CBC WITH DIFFERENTIAL/PLATELET - Abnormal; Notable for the following components:      Result Value   WBC 13.5 (*)    RBC 5.48 (*)    Hemoglobin 15.4 (*)    HCT 49.2 (*)    Neutro Abs 11.1 (*)    All other components within normal limits  COMPREHENSIVE METABOLIC PANEL - Abnormal; Notable for the following components:   Glucose, Bld 145 (*)    GFR calc non Af Amer 55 (*)    All other components within normal limits  SARS CORONAVIRUS 2 BY RT PCR (HOSPITAL ORDER, [facility])  URINALYSIS, ROUTINE W REFLEX MICROSCOPIC    EKG EKG Interpretation  Date/Time:  Thursday Jul 26 2019 19:04:08 EDT Ventricular Rate:  73 PR Interval:    QRS Duration: 150 QT Interval:  438 QTC Calculation: 483 R Axis:   -85 Text  Interpretation: Atrial fibrillation Artifact Ventricular paced No significant change since last tracing Confirmed by Gareth Morgan [phone]) on 07/26/2019 7:24:52 PM   Radiology DG Thoracic Spine 2 View  Result Date: 07/26/2019 CLINICAL DATA:  Un witnessed fall, found down  EXAM: THORACIC SPINE 2 VIEWS COMPARISON:  07/21/2019 FINDINGS: Frontal and cross-table lateral views of the thoracic spine are obtained. Lateral views are suboptimal due to positioning and overlying structures. Stable right convex curvature centered in the lower thoracic spine. Otherwise alignment appears anatomic. There is extensive multilevel thoracic spondylosis unchanged. No evidence of acute fracture. IMPRESSION: 1. No gross displaced fracture. Evaluation limited by technique and positioning. Electronically Signed   By: Randa Ngo M.D.   On: 07/26/2019 19:12   DG Wrist Complete Left  Result Date: 07/26/2019 CLINICAL DATA:  Un witnessed fall, left hand and wrist bruising and skin tears EXAM: LEFT HAND - COMPLETE 3+ VIEW; LEFT WRIST - COMPLETE 3+ VIEW COMPARISON:  None. FINDINGS: Left wrist: Frontal, oblique, and lateral views of the left wrist are obtained as well as an ulnar deviated view. There is diffuse osteoarthritis throughout the left wrist. No fracture, subluxation, or dislocation. Soft tissues are unremarkable. Left hand: Frontal, oblique, lateral views of the left hand are obtained. Bones are diffusely osteopenic. No acute displaced fracture, subluxation, or dislocation. There is diffuse interphalangeal joint space narrowing, most pronounced at the first and second digits. Soft tissues are unremarkable. IMPRESSION: 1. 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 2. No acute displaced fracture. 3. Osteopenia. Electronically Signed   By: Randa Ngo M.D.   On: 07/26/2019 21:11   CT Head Wo Contrast  Result Date: 07/26/2019 CLINICAL DATA:  Posttraumatic headache after fall. EXAM: CT HEAD WITHOUT CONTRAST CT  CERVICAL SPINE WITHOUT CONTRAST TECHNIQUE: Multidetector CT imaging of the head and cervical spine was performed following the standard protocol without intravenous contrast. Multiplanar CT image reconstructions of the cervical spine were also generated. COMPARISON:  Jul 21, 2019. FINDINGS: CT HEAD FINDINGS Brain: Mild diffuse cortical atrophy is noted. Mild chronic ischemic white matter disease is noted. No mass effect or midline shift is noted. Ventricular size is within normal limits. There is no evidence of mass lesion, hemorrhage or acute infarction. Vascular: No hyperdense vessel or unexpected calcification. Skull: Normal. Negative for fracture or focal lesion. Sinuses/Orbits: No acute finding. Other: Small left frontal scalp hematoma is noted. CT CERVICAL SPINE FINDINGS Alignment: Mild grade 1 anterolisthesis of C4-5 is noted secondary to posterior facet joint hypertrophy. Skull base and vertebrae: No acute fracture. No primary bone lesion or focal pathologic process. Soft tissues and spinal canal: No prevertebral fluid or swelling. No visible canal hematoma. Disc levels: Severe degenerative disc disease is noted at C5-6, C6-7 and C7-T1 with anterior and posterior osteophyte formation. Upper chest: Negative. Other: Degenerative changes are seen involving posterior facet joints bilaterally. IMPRESSION: 1. Mild diffuse cortical atrophy. Mild chronic ischemic white matter disease. Small left frontal scalp hematoma. No acute intracranial abnormality seen. 2. Severe multilevel degenerative disc disease. No acute abnormality seen in the cervical spine. Electronically Signed   By: Marijo Conception M.D.   On: 07/26/2019 18:33   CT Cervical Spine Wo Contrast  Result Date: 07/26/2019 CLINICAL DATA:  Posttraumatic headache after fall. EXAM: CT HEAD WITHOUT CONTRAST CT CERVICAL SPINE WITHOUT CONTRAST TECHNIQUE: Multidetector CT imaging of the head and cervical spine was performed following the standard protocol  without intravenous contrast. Multiplanar CT image reconstructions of the cervical spine were also generated. COMPARISON:  Jul 21, 2019. FINDINGS: CT HEAD FINDINGS Brain: Mild diffuse cortical atrophy is noted. Mild chronic ischemic white matter disease is noted. No mass effect or midline shift is noted. Ventricular size is within normal limits. There is no evidence of  mass lesion, hemorrhage or acute infarction. Vascular: No hyperdense vessel or unexpected calcification. Skull: Normal. Negative for fracture or focal lesion. Sinuses/Orbits: No acute finding. Other: Small left frontal scalp hematoma is noted. CT CERVICAL SPINE FINDINGS Alignment: Mild grade 1 anterolisthesis of C4-5 is noted secondary to posterior facet joint hypertrophy. Skull base and vertebrae: No acute fracture. No primary bone lesion or focal pathologic process. Soft tissues and spinal canal: No prevertebral fluid or swelling. No visible canal hematoma. Disc levels: Severe degenerative disc disease is noted at C5-6, C6-7 and C7-T1 with anterior and posterior osteophyte formation. Upper chest: Negative. Other: Degenerative changes are seen involving posterior facet joints bilaterally. IMPRESSION: 1. Mild diffuse cortical atrophy. Mild chronic ischemic white matter disease. Small left frontal scalp hematoma. No acute intracranial abnormality seen. 2. Severe multilevel degenerative disc disease. No acute abnormality seen in the cervical spine. Electronically Signed   By: Marijo Conception M.D.   On: 07/26/2019 18:33   DG Chest Portable 1 View  Result Date: 07/26/2019 CLINICAL DATA:  Hip fracture.  Screening.  Unwitnessed cough. EXAM: PORTABLE CHEST 1 VIEW COMPARISON:  Radiograph 03/24/2019, CT 03/26/2019 FINDINGS: Single lead left-sided pacemaker. Median sternotomy and CABG. Mild cardiomegaly is chronic and unchanged. Aortic atherosclerosis. Mild chronic peribronchial thickening. No focal airspace disease. No pleural fluid or pneumothorax. No  pulmonary edema. The bones are under mineralized. No acute osseous abnormalities are seen. Right lower rib fractures have surrounding callus formation and appear at least subacute. IMPRESSION: 1. No acute chest findings. 2. Post CABG with mild chronic cardiomegaly. 3. Right lower rib fractures have surrounding callus formation and appear at least subacute. Electronically Signed   By: Keith Rake M.D.   On: 07/26/2019 19:14   DG Hand Complete Left  Result Date: 07/26/2019 CLINICAL DATA:  Un witnessed fall, left hand and wrist bruising and skin tears EXAM: LEFT HAND - COMPLETE 3+ VIEW; LEFT WRIST - COMPLETE 3+ VIEW COMPARISON:  None. FINDINGS: Left wrist: Frontal, oblique, and lateral views of the left wrist are obtained as well as an ulnar deviated view. There is diffuse osteoarthritis throughout the left wrist. No fracture, subluxation, or dislocation. Soft tissues are unremarkable. Left hand: Frontal, oblique, lateral views of the left hand are obtained. Bones are diffusely osteopenic. No acute displaced fracture, subluxation, or dislocation. There is diffuse interphalangeal joint space narrowing, most pronounced at the first and second digits. Soft tissues are unremarkable. IMPRESSION: 1. 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 2. No acute displaced fracture. 3. Osteopenia. Electronically Signed   By: Randa Ngo M.D.   On: 07/26/2019 21:11   DG Hip Unilat W or Wo Pelvis 2-3 Views Left  Result Date: 07/26/2019 CLINICAL DATA:  Left-sided hip pain after fall EXAM: DG HIP (WITH OR WITHOUT PELVIS) 2-3V LEFT COMPARISON:  07/21/2019 FINDINGS: Acute mildly comminuted left intertrochanteric fracture without angulation. Femoral head is normally aligned. Pubic symphysis and rami are intact. IMPRESSION: Acute left intertrochanteric fracture Electronically Signed   By: Donavan Foil M.D.   On: 07/26/2019 18:13    Procedures Procedures (including critical care time)  Medications Ordered in  ED Medications  fentaNYL (SUBLIMAZE) injection 100 mcg (100 mcg Intravenous Given 07/26/19 1926)    ED Course  I have reviewed the triage vital signs and the nursing notes.  Pertinent labs & imaging results that were available during my care of the patient were reviewed by me and considered in my medical decision making (see chart for details).    MDM  Rules/Calculators/A&P                       86 year old female with a history of atrial fibrillation not on anticoagulation, hypertension, hyperlipidemia, CAD status post CABG, dementia, who presents from [place] with concern for fall and left hip pain.  CT head and cervical spine showed no acute traumatic abnormalities.  X-ray wrist and hand are without abnormalities.  X-ray of the left hip shows intertrochanteric fracture.  [name] neurovascularly intact, close fracture.  Discussed with Dr. Erlinda Hong who recommends NPO at [facility] and transfer to [facility] Dr. Roel Cluck hospitalist admitting.  Final Clinical Impression(s) / ED Diagnoses Final diagnoses:  Fall, initial encounter  Closed fracture of left hip, initial encounter [facility])    Rx / DC Orders ED Discharge Orders    None       Gareth Morgan, MD 07/26/19 2143

## 2019-07-27 ENCOUNTER — Inpatient Hospital Stay (HOSPITAL_COMMUNITY): Payer: Medicare Other

## 2019-07-27 ENCOUNTER — Inpatient Hospital Stay (HOSPITAL_COMMUNITY): Payer: Medicare Other | Admitting: Anesthesiology

## 2019-07-27 ENCOUNTER — Encounter (HOSPITAL_COMMUNITY)
Admission: EM | Disposition: A | Discharge status: Skilled Nursing Facility | Payer: Self-pay | Source: Home / Self Care | Attending: Family Medicine

## 2019-07-27 ENCOUNTER — Encounter (HOSPITAL_COMMUNITY): Payer: Self-pay | Admitting: Internal Medicine

## 2019-07-27 DIAGNOSIS — I2510 Atherosclerotic heart disease of native coronary artery without angina pectoris: Secondary | ICD-10-CM

## 2019-07-27 DIAGNOSIS — S72002A Fracture of unspecified part of neck of left femur, initial encounter for closed fracture: Secondary | ICD-10-CM

## 2019-07-27 DIAGNOSIS — I5031 Acute diastolic (congestive) heart failure: Secondary | ICD-10-CM

## 2019-07-27 DIAGNOSIS — Z01810 Encounter for preprocedural cardiovascular examination: Secondary | ICD-10-CM

## 2019-07-27 DIAGNOSIS — I480 Paroxysmal atrial fibrillation: Secondary | ICD-10-CM

## 2019-07-27 HISTORY — PX: INTRAMEDULLARY (IM) NAIL INTERTROCHANTERIC: SHX5875

## 2019-07-27 LAB — CBC
HCT: 45.2 % (ref 36.0–46.0)
Hemoglobin: 14.5 g/dL (ref 12.0–15.0)
MCH: 27.8 pg (ref 26.0–34.0)
MCHC: 32.1 g/dL (ref 30.0–36.0)
MCV: 86.6 fL (ref 80.0–100.0)
Platelets: 237 10*3/uL (ref 150–400)
RBC: 5.22 MIL/uL — ABNORMAL HIGH (ref 3.87–5.11)
RDW: 14.6 % (ref 11.5–15.5)
WBC: 16.5 10*3/uL — ABNORMAL HIGH (ref 4.0–10.5)
nRBC: 0 % (ref 0.0–0.2)

## 2019-07-27 LAB — BASIC METABOLIC PANEL
Anion gap: 9 (ref 5–15)
BUN: 12 mg/dL (ref 8–23)
CO2: 26 mmol/L (ref 22–32)
Calcium: 9.6 mg/dL (ref 8.9–10.3)
Chloride: 102 mmol/L (ref 98–111)
Creatinine, Ser: 0.88 mg/dL (ref 0.44–1.00)
GFR calc Af Amer: >60 mL/min (ref >60)
GFR calc non Af Amer: 57 mL/min — ABNORMAL LOW (ref >60)
Glucose, Bld: 155 mg/dL — ABNORMAL HIGH (ref 70–99)
Potassium: 3.3 mmol/L — ABNORMAL LOW (ref 3.5–5.1)
Sodium: 137 mmol/L (ref 135–145)

## 2019-07-27 LAB — VITAMIN D 25 HYDROXY (VIT D DEFICIENCY, FRACTURES): Vit D, 25-Hydroxy: 38.75 ng/mL (ref 30–100)

## 2019-07-27 LAB — ECHOCARDIOGRAM COMPLETE

## 2019-07-27 LAB — SURGICAL PCR SCREEN
MRSA, PCR: NEGATIVE
Staphylococcus aureus: NEGATIVE

## 2019-07-27 SURGERY — FIXATION, FRACTURE, INTERTROCHANTERIC, WITH INTRAMEDULLARY ROD
Anesthesia: General | Site: Hip | Laterality: Left

## 2019-07-27 MED ORDER — HYDROCODONE-ACETAMINOPHEN 7.5-325 MG PO TABS
1.0000 | ORAL_TABLET | ORAL | Status: DC | PRN
  Administered 2019-07-28: 1 via ORAL
  Filled 2019-07-27: qty 2

## 2019-07-27 MED ORDER — 0.9 % SODIUM CHLORIDE (POUR BTL) OPTIME
TOPICAL | Status: DC | PRN
  Administered 2019-07-27: 1000 mL

## 2019-07-27 MED ORDER — CHLORHEXIDINE GLUCONATE 0.12 % MT SOLN
15.0000 mL | Freq: Once | OROMUCOSAL | Status: AC
  Administered 2019-07-27: 15 mL via OROMUCOSAL

## 2019-07-27 MED ORDER — ALBUMIN HUMAN 5 % IV SOLN: INTRAVENOUS | Status: DC | PRN

## 2019-07-27 MED ORDER — ONDANSETRON HCL 4 MG PO TABS: 4.0000 mg | ORAL_TABLET | Freq: Four times a day (QID) | ORAL | Status: DC | PRN

## 2019-07-27 MED ORDER — FENTANYL CITRATE (PF) 100 MCG/2ML IJ SOLN: 25.0000 ug | INTRAMUSCULAR | Status: DC | PRN

## 2019-07-27 MED ORDER — MAGNESIUM CITRATE PO SOLN: 1.0000 | Freq: Once | ORAL | Status: DC | PRN

## 2019-07-27 MED ORDER — MORPHINE SULFATE (PF) 2 MG/ML IV SOLN: 1.0000 mg | INTRAVENOUS | Status: DC | PRN

## 2019-07-27 MED ORDER — ENOXAPARIN SODIUM 40 MG/0.4ML ~~LOC~~ SOLN: 40.0000 mg | Freq: Every day | SUBCUTANEOUS | 13 refills | Status: DC

## 2019-07-27 MED ORDER — ACETAMINOPHEN 10 MG/ML IV SOLN
INTRAVENOUS | Status: DC | PRN
  Administered 2019-07-27: 1000 mg via INTRAVENOUS

## 2019-07-27 MED ORDER — HYDRALAZINE HCL 20 MG/ML IJ SOLN
5.0000 mg | Freq: Four times a day (QID) | INTRAMUSCULAR | Status: DC | PRN
  Administered 2019-07-27: 5 mg via INTRAVENOUS
  Filled 2019-07-27: qty 1

## 2019-07-27 MED ORDER — ONDANSETRON HCL 4 MG/2ML IJ SOLN: 4.0000 mg | Freq: Four times a day (QID) | INTRAMUSCULAR | Status: DC | PRN

## 2019-07-27 MED ORDER — POTASSIUM CHLORIDE 10 MEQ/100ML IV SOLN
10.0000 meq | INTRAVENOUS | Status: AC
  Administered 2019-07-27 (×2): 10 meq via INTRAVENOUS
  Filled 2019-07-27 (×2): qty 100

## 2019-07-27 MED ORDER — ACETAMINOPHEN 325 MG PO TABS
325.0000 mg | ORAL_TABLET | Freq: Four times a day (QID) | ORAL | Status: DC | PRN
  Administered 2019-07-29: 650 mg via ORAL
  Filled 2019-07-27: qty 2

## 2019-07-27 MED ORDER — ACETAMINOPHEN 10 MG/ML IV SOLN
INTRAVENOUS | Status: AC
  Filled 2019-07-27: qty 100

## 2019-07-27 MED ORDER — ACETAMINOPHEN 10 MG/ML IV SOLN: 1000.0000 mg | Freq: Once | INTRAVENOUS | Status: DC | PRN

## 2019-07-27 MED ORDER — POLYETHYLENE GLYCOL 3350 17 G PO PACK: 17.0000 g | PACK | Freq: Every day | ORAL | Status: DC | PRN

## 2019-07-27 MED ORDER — PHENYLEPHRINE HCL (PRESSORS) 10 MG/ML IV SOLN
INTRAVENOUS | Status: DC | PRN
  Administered 2019-07-27 (×5): 80 ug via INTRAVENOUS

## 2019-07-27 MED ORDER — TRANEXAMIC ACID-NACL 1000-0.7 MG/100ML-% IV SOLN
INTRAVENOUS | Status: AC | PRN
  Administered 2019-07-27: 2000 mg via INTRAVENOUS

## 2019-07-27 MED ORDER — SUGAMMADEX SODIUM 200 MG/2ML IV SOLN
INTRAVENOUS | Status: DC | PRN
  Administered 2019-07-27: 200 mg via INTRAVENOUS

## 2019-07-27 MED ORDER — ONDANSETRON HCL 4 MG/2ML IJ SOLN: 4.0000 mg | Freq: Once | INTRAMUSCULAR | Status: DC | PRN

## 2019-07-27 MED ORDER — ENOXAPARIN SODIUM 30 MG/0.3ML ~~LOC~~ SOLN
30.0000 mg | SUBCUTANEOUS | Status: DC
  Administered 2019-07-28 – 2019-08-02 (×6): 30 mg via SUBCUTANEOUS
  Filled 2019-07-27 (×6): qty 0.3

## 2019-07-27 MED ORDER — MENTHOL 3 MG MT LOZG: 1.0000 | LOZENGE | OROMUCOSAL | Status: DC | PRN

## 2019-07-27 MED ORDER — ALUM & MAG HYDROXIDE-SIMETH 200-200-20 MG/5ML PO SUSP: 30.0000 mL | ORAL | Status: DC | PRN

## 2019-07-27 MED ORDER — HYDROCODONE-ACETAMINOPHEN 5-325 MG PO TABS: 1.0000 | ORAL_TABLET | Freq: Three times a day (TID) | ORAL | 0 refills | Status: DC | PRN

## 2019-07-27 MED ORDER — FENTANYL CITRATE (PF) 250 MCG/5ML IJ SOLN
INTRAMUSCULAR | Status: AC
  Filled 2019-07-27: qty 5

## 2019-07-27 MED ORDER — METHOCARBAMOL 500 MG PO TABS: 500.0000 mg | ORAL_TABLET | Freq: Four times a day (QID) | ORAL | Status: DC | PRN

## 2019-07-27 MED ORDER — HYDRALAZINE HCL 20 MG/ML IJ SOLN
10.0000 mg | Freq: Four times a day (QID) | INTRAMUSCULAR | Status: DC | PRN
  Administered 2019-07-27: 10 mg via INTRAVENOUS
  Filled 2019-07-27: qty 0.5
  Filled 2019-07-27: qty 1

## 2019-07-27 MED ORDER — ACETAMINOPHEN 500 MG PO TABS
500.0000 mg | ORAL_TABLET | Freq: Four times a day (QID) | ORAL | Status: AC
  Administered 2019-07-27 – 2019-07-28 (×4): 500 mg via ORAL
  Filled 2019-07-27 (×4): qty 1

## 2019-07-27 MED ORDER — CEFAZOLIN SODIUM-DEXTROSE 2-4 GM/100ML-% IV SOLN
2.0000 g | Freq: Four times a day (QID) | INTRAVENOUS | Status: AC
  Administered 2019-07-27 – 2019-07-28 (×3): 2 g via INTRAVENOUS
  Filled 2019-07-27 (×3): qty 100

## 2019-07-27 MED ORDER — LACTATED RINGERS IV SOLN: INTRAVENOUS | Status: DC

## 2019-07-27 MED ORDER — PROPOFOL 10 MG/ML IV BOLUS
INTRAVENOUS | Status: DC | PRN
  Administered 2019-07-27: 100 mg via INTRAVENOUS

## 2019-07-27 MED ORDER — ENSURE ENLIVE PO LIQD
237.0000 mL | Freq: Two times a day (BID) | ORAL | Status: DC
  Administered 2019-07-27 – 2019-08-01 (×9): 237 mL via ORAL

## 2019-07-27 MED ORDER — FENTANYL CITRATE (PF) 250 MCG/5ML IJ SOLN
INTRAMUSCULAR | Status: DC | PRN
  Administered 2019-07-27 (×3): 50 ug via INTRAVENOUS

## 2019-07-27 MED ORDER — ONDANSETRON HCL 4 MG/2ML IJ SOLN
INTRAMUSCULAR | Status: DC | PRN
  Administered 2019-07-27: 4 mg via INTRAVENOUS

## 2019-07-27 MED ORDER — STERILE WATER FOR IRRIGATION IR SOLN
Status: DC | PRN
  Administered 2019-07-27: 1000 mL

## 2019-07-27 MED ORDER — HYDROCODONE-ACETAMINOPHEN 5-325 MG PO TABS
1.0000 | ORAL_TABLET | ORAL | Status: DC | PRN
  Administered 2019-07-28: 1 via ORAL
  Administered 2019-07-29: 2 via ORAL
  Filled 2019-07-27: qty 2
  Filled 2019-07-27: qty 1

## 2019-07-27 MED ORDER — ADULT MULTIVITAMIN W/MINERALS CH
1.0000 | ORAL_TABLET | Freq: Every day | ORAL | Status: DC
  Administered 2019-07-28 – 2019-08-02 (×6): 1 via ORAL
  Filled 2019-07-27 (×6): qty 1

## 2019-07-27 MED ORDER — PHENYLEPHRINE HCL-NACL 10-0.9 MG/250ML-% IV SOLN
INTRAVENOUS | Status: DC | PRN
  Administered 2019-07-27: 25 ug/min via INTRAVENOUS

## 2019-07-27 MED ORDER — POTASSIUM CHLORIDE 10 MEQ/100ML IV SOLN
INTRAVENOUS | Status: AC
  Administered 2019-07-27: 10 meq
  Filled 2019-07-27: qty 100

## 2019-07-27 MED ORDER — SORBITOL 70 % SOLN
30.0000 mL | Freq: Every day | Status: DC | PRN
  Administered 2019-07-31: 30 mL via ORAL
  Filled 2019-07-27: qty 30

## 2019-07-27 MED ORDER — ROCURONIUM BROMIDE 10 MG/ML (PF) SYRINGE
PREFILLED_SYRINGE | INTRAVENOUS | Status: DC | PRN
  Administered 2019-07-27: 30 mg via INTRAVENOUS

## 2019-07-27 MED ORDER — SODIUM CHLORIDE 0.9 % IV SOLN: INTRAVENOUS | Status: DC

## 2019-07-27 MED ORDER — DEXAMETHASONE SODIUM PHOSPHATE 10 MG/ML IJ SOLN
INTRAMUSCULAR | Status: DC | PRN
  Administered 2019-07-27: 10 mg via INTRAVENOUS

## 2019-07-27 MED ORDER — DOCUSATE SODIUM 100 MG PO CAPS
100.0000 mg | ORAL_CAPSULE | Freq: Two times a day (BID) | ORAL | Status: DC
  Administered 2019-07-27 – 2019-08-02 (×11): 100 mg via ORAL
  Filled 2019-07-27 (×11): qty 1

## 2019-07-27 MED ORDER — PHENOL 1.4 % MT LIQD: 1.0000 | OROMUCOSAL | Status: DC | PRN

## 2019-07-27 MED ORDER — METHOCARBAMOL 1000 MG/10ML IJ SOLN: 500.0000 mg | Freq: Four times a day (QID) | INTRAVENOUS | Status: DC | PRN

## 2019-07-27 MED ORDER — LIDOCAINE 2% (20 MG/ML) 5 ML SYRINGE
INTRAMUSCULAR | Status: DC | PRN
  Administered 2019-07-27: 60 mg via INTRAVENOUS

## 2019-07-27 MED ORDER — PHENYLEPHRINE 40 MCG/ML (10ML) SYRINGE FOR IV PUSH (FOR BLOOD PRESSURE SUPPORT)
PREFILLED_SYRINGE | INTRAVENOUS | Status: AC
  Filled 2019-07-27: qty 10

## 2019-07-27 SURGICAL SUPPLY — 49 items
BIT DRILL INTERTAN LAG SCREW (BIT) ×2 IMPLANT
BIT DRILL SHORT 4.0 (BIT) ×1 IMPLANT
BNDG COHESIVE 4X5 TAN STRL (GAUZE/BANDAGES/DRESSINGS) ×2 IMPLANT
BNDG COHESIVE 6X5 TAN STRL LF (GAUZE/BANDAGES/DRESSINGS) IMPLANT
BNDG GAUZE ELAST 4 BULKY (GAUZE/BANDAGES/DRESSINGS) ×2 IMPLANT
CONT SPEC 4OZ CLIKSEAL STRL BL (MISCELLANEOUS) ×2 IMPLANT
COVER PERINEAL POST (MISCELLANEOUS) ×2 IMPLANT
COVER SURGICAL LIGHT HANDLE (MISCELLANEOUS) ×2 IMPLANT
COVER WAND RF STERILE (DRAPES) IMPLANT
DRAPE C-ARMOR (DRAPES) ×2 IMPLANT
DRAPE STERI IOBAN 125X83 (DRAPES) ×2 IMPLANT
DRILL BIT SHORT 4.0 (BIT) ×2
DRSG MEPILEX BORDER 4X4 (GAUZE/BANDAGES/DRESSINGS) ×4 IMPLANT
DRSG MEPILEX BORDER 4X8 (GAUZE/BANDAGES/DRESSINGS) ×2 IMPLANT
DRSG PAD ABDOMINAL 8X10 ST (GAUZE/BANDAGES/DRESSINGS) ×4 IMPLANT
DURAPREP 26ML APPLICATOR (WOUND CARE) ×2 IMPLANT
ELECT REM PT RETURN 9FT ADLT (ELECTROSURGICAL)
ELECTRODE REM PT RTRN 9FT ADLT (ELECTROSURGICAL) IMPLANT
GAUZE SPONGE 4X4 12PLY STRL LF (GAUZE/BANDAGES/DRESSINGS) ×2 IMPLANT
GAUZE XEROFORM 1X8 LF (GAUZE/BANDAGES/DRESSINGS) ×2 IMPLANT
GLOVE BIOGEL PI IND STRL 7.0 (GLOVE) IMPLANT
GLOVE BIOGEL PI INDICATOR 7.0 (GLOVE)
GLOVE ECLIPSE 7.0 STRL STRAW (GLOVE) IMPLANT
GLOVE SKINSENSE NS SZ7.5 (GLOVE) ×2
GLOVE SKINSENSE STRL SZ7.5 (GLOVE) ×2 IMPLANT
GOWN STRL REIN XL XLG (GOWN DISPOSABLE) ×2 IMPLANT
GUIDE PIN 3.2X343 (PIN) ×2
GUIDE PIN 3.2X343MM (PIN) ×4
KIT BASIN OR (CUSTOM PROCEDURE TRAY) ×2 IMPLANT
KIT TURNOVER KIT B (KITS) ×2 IMPLANT
MANIFOLD NEPTUNE II (INSTRUMENTS) IMPLANT
NAIL LEFT 10X36 (Nail) ×2 IMPLANT
NS IRRIG 1000ML POUR BTL (IV SOLUTION) ×2 IMPLANT
PACK GENERAL/GYN (CUSTOM PROCEDURE TRAY) ×2 IMPLANT
PAD ARMBOARD 7.5X6 YLW CONV (MISCELLANEOUS) ×4 IMPLANT
PAD CAST 4YDX4 CTTN HI CHSV (CAST SUPPLIES) IMPLANT
PADDING CAST COTTON 4X4 STRL (CAST SUPPLIES)
PIN GUIDE 3.2X343MM (PIN) ×2 IMPLANT
SCREW LAG COMPR KIT 85/80 (Screw) ×2 IMPLANT
SCREW LAG COMPR KIT 90/85 (Screw) ×2 IMPLANT
SCREW TRIGEN LOW PROF 5.0X40 (Screw) ×2 IMPLANT
STAPLER VISISTAT 35W (STAPLE) ×2 IMPLANT
SUT VIC AB 0 CT1 27 (SUTURE) ×2
SUT VIC AB 0 CT1 27XBRD ANBCTR (SUTURE) ×1 IMPLANT
SUT VIC AB 2-0 CT1 27 (SUTURE) ×2
SUT VIC AB 2-0 CT1 TAPERPNT 27 (SUTURE) ×1 IMPLANT
TOWEL GREEN STERILE (TOWEL DISPOSABLE) ×2 IMPLANT
TOWEL GREEN STERILE FF (TOWEL DISPOSABLE) ×2 IMPLANT
WATER STERILE IRR 1000ML POUR (IV SOLUTION) IMPLANT

## 2019-07-27 NOTE — Plan of Care (Signed)

## 2019-07-27 NOTE — Consult Note (Signed)
ORTHOPAEDIC CONSULTATION  REQUESTING PHYSICIAN: Darliss Cheney, MD  Chief Complaint: Left intertroch fx  HPI: Cheryl Zamora is a 86 y.o. female who presents with left hip fx s/p unwitnessed fall at SNF.  [name] minimally ambulatory since recent falls.  Nephew Rudi Heap is HCPOA whom I spoke with this morning.  Past Medical History:  Diagnosis Date  . Atrial fibrillation with rapid ventricular response ([place]) 11/2012; 10/2013   a. s/p TEE/DCCV 11/2012 & 10/2013 b. amiodarone caused significant QT prlongation (>654msec)  . Chronic combined systolic and diastolic CHF (congestive heart failure) ([place])   . Coronary artery disease 2005   a. s/p CABG (2012)  . GERD (gastroesophageal reflux disease)   . Hyperlipidemia    pt denies this hx on 01/03/2015  . Hypertension   . Neuropathy   . Osteoarthritis of back    Past Surgical History:  Procedure Laterality Date  . ABDOMINAL HYSTERECTOMY    . AV NODE ABLATION N/A 11/16/2016   Procedure: AV Node Ablation;  Surgeon: Constance Haw, MD;  Location: [place] [facility];  Service: Cardiovascular;  Laterality: N/A;  . BACK SURGERY    . CARDIAC CATHETERIZATION  04/12/2003   [place] 60, LAD 90, D1 30, CFX 95->T, RCA OK  . CARDIOVERSION N/A 12/13/2012   Procedure: CARDIOVERSION;  Surgeon: Larey Dresser, MD;  Location: [place];  Service: Cardiovascular;  Laterality: N/A;  . CARDIOVERSION N/A 11/13/2013   Procedure: CARDIOVERSION;  Surgeon: Leiya Spark, MD;  Location: [facility] ENDOSCOPY;  Service: Cardiovascular;  Laterality: N/A;  . CARDIOVERSION N/A 04/12/2014   Procedure: CARDIOVERSION;  Surgeon: Thayer Headings, MD;  Location: [facility] ENDOSCOPY;  Service: Cardiovascular;  Laterality: N/A;  . CARDIOVERSION N/A 01/06/2015   Procedure: CARDIOVERSION;  Surgeon: Larey Dresser, MD;  Location: [place];  Service: Cardiovascular;  Laterality: N/A;  . CARDIOVERSION N/A 11/11/2016   Procedure: CARDIOVERSION;  Surgeon: Josue Hector, MD;   Location: [facility] ENDOSCOPY;  Service: Cardiovascular;  Laterality: N/A;  . [place]  1960's   "ruptured disc'  . CORONARY ANGIOPLASTY    . CORONARY ARTERY BYPASS GRAFT  04/18/2003   x 4; LIMA-LAD, SVG-D1, SVG-OM1-OM2  . PACEMAKER IMPLANT N/A 11/16/2016   Procedure: Pacemaker Implant;  Surgeon: Constance Haw, MD;  Location: [place] [facility];  Service: Cardiovascular;  Laterality: N/A;  . TEE WITHOUT CARDIOVERSION N/A 12/13/2012   Procedure: TRANSESOPHAGEAL ECHOCARDIOGRAM (TEE);  Surgeon: Larey Dresser, MD;  Location: [facility] ENDOSCOPY;  Service: Cardiovascular;  Laterality: N/A;  . TEE WITHOUT CARDIOVERSION N/A 11/13/2013   Procedure: TRANSESOPHAGEAL ECHOCARDIOGRAM (TEE);  Surgeon: Aneth Spark, MD;  Location: [place];  Service: Cardiovascular;  Laterality: N/A;  . TEE WITHOUT CARDIOVERSION N/A 11/11/2016   Procedure: TRANSESOPHAGEAL ECHOCARDIOGRAM (TEE);  Surgeon: Josue Hector, MD;  Location[facility] ENDOSCOPY;  Service: Cardiovascular;  Laterality: N/A;  . TONSILLECTOMY     Social History   Socioeconomic History  . Marital status: Legally Separated    Spouse name: Not on file  . Number of children: Not on file  . Years of education: Not on file  . Highest education level: Not on file  Occupational History  . Occupation: Retired  Tobacco Use  . Smoking status: Former Smoker    Packs/day: 1.00    Years: 60.00    Pack years: 60.00    Types: Cigarettes  . Smokeless tobacco: Never Used  Substance and Sexual Activity  . Alcohol use: Yes    Comment: 01/03/2015 "  might have a beer a couple times/yr"  . Drug use: No  . Sexual activity: Never  Other Topics Concern  . Not on file  Social History Narrative   Pt son helps with her care.   Social Determinants of Health   Financial Resource Strain:   . Difficulty of Paying Living Expenses:   Food Insecurity:   . Worried About Charity fundraiser in the Last Year:   . Arboriculturist in the Last Year:     Transportation Needs:   . Film/video editor (Medical):   Marland Kitchen Lack of Transportation (Non-Medical):   Physical Activity:   . Days of Exercise per Week:   . Minutes of Exercise per Session:   Stress:   . Feeling of Stress :   Social Connections:   . Frequency of Communication with Friends and Family:   . Frequency of Social Gatherings with Friends and Family:   . Attends Religious Services:   . Active Member of Clubs or Organizations:   . Attends Archivist Meetings:   Marland Kitchen Marital Status:    Family History  Problem Relation Age of Onset  . Heart attack Mother   . Heart attack Father   . Heart attack Brother   . Heart attack Brother   . Heart attack Brother   . Heart attack Brother    - negative except otherwise stated in the family history section Allergies  Allergen Reactions  . Digoxin And Related Other (See Comments)    Increased HR  . Amiodarone Other (See Comments)    Excessive QT prolongation, nausea  . Codeine Nausea And Vomiting  . Penicillins Nausea Only    Did it involve swelling of the face/tongue/throat, SOB, or low BP? Unknown Did it involve sudden or severe rash/hives, skin peeling, or any reaction on the inside of your mouth or nose? Unknown Did you need to seek medical attention at a hospital or doctor's office? Unknown When did it last happen? If all above answers are "NO", may proceed with cephalosporin use.   Prior to Admission medications   Medication Sig Start Date End Date Taking? Authorizing Provider  Amino Acids-Protein Hydrolys (FEEDING SUPPLEMENT, PRO-STAT SUGAR FREE 64,) LIQD Take 30 mLs by mouth daily.   Yes [provider]  Cholecalciferol (VITAMIN D3) 1000 units CAPS Take 1,000 Units by mouth daily.   Yes [provider]  memantine (NAMENDA) 10 MG tablet Take 10 mg by mouth daily.    Yes [provider]  metoprolol tartrate (LOPRESSOR) 25 MG tablet Take 0.5 tablets (12.5 mg total) by mouth 2 (two)  times daily. 03/29/19  Yes Nita Sells, MD  Multiple Vitamin (MULTIVITAMIN) tablet Take 1 tablet by mouth daily.   Yes [provider]  nystatin (NYSTATIN) powder Apply 1 application topically every evening.   Yes [provider]  acetaminophen (TYLENOL) 325 MG tablet Take 650 mg by mouth every 6 (six) hours as needed for mild pain.    [provider]   DG Thoracic Spine 2 View  Result Date: 07/26/2019 CLINICAL DATA:  Un witnessed fall, found down EXAM: THORACIC SPINE 2 VIEWS COMPARISON:  07/21/2019 FINDINGS: Frontal and cross-table lateral views of the thoracic spine are obtained. Lateral views are suboptimal due to positioning and overlying structures. Stable right convex curvature centered in the lower thoracic spine. Otherwise alignment appears anatomic. There is extensive multilevel thoracic spondylosis unchanged. No evidence of acute fracture. IMPRESSION: 1. No gross displaced fracture. Evaluation limited  by technique and positioning. Electronically Signed   By: Randa Ngo M.D.   On: 07/26/2019 19:12   DG Wrist Complete Left  Result Date: 07/26/2019 CLINICAL DATA:  Un witnessed fall, left hand and wrist bruising and skin tears EXAM: LEFT HAND - COMPLETE 3+ VIEW; LEFT WRIST - COMPLETE 3+ VIEW COMPARISON:  None. FINDINGS: Left wrist: Frontal, oblique, and lateral views of the left wrist are obtained as well as an ulnar deviated view. There is diffuse osteoarthritis throughout the left wrist. No fracture, subluxation, or dislocation. Soft tissues are unremarkable. Left hand: Frontal, oblique, lateral views of the left hand are obtained. Bones are diffusely osteopenic. No acute displaced fracture, subluxation, or dislocation. There is diffuse interphalangeal joint space narrowing, most pronounced at the first and second digits. Soft tissues are unremarkable. IMPRESSION: 1. 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 2. No acute displaced fracture. 3.  Osteopenia. Electronically Signed   By: Randa Ngo M.D.   On: 07/26/2019 21:11   CT Head Wo Contrast  Result Date: 07/26/2019 CLINICAL DATA:  Posttraumatic headache after fall. EXAM: CT HEAD WITHOUT CONTRAST CT CERVICAL SPINE WITHOUT CONTRAST TECHNIQUE: Multidetector CT imaging of the head and cervical spine was performed following the standard protocol without intravenous contrast. Multiplanar CT image reconstructions of the cervical spine were also generated. COMPARISON:  Jul 21, 2019. FINDINGS: CT HEAD FINDINGS Brain: Mild diffuse cortical atrophy is noted. Mild chronic ischemic white matter disease is noted. No mass effect or midline shift is noted. Ventricular size is within normal limits. There is no evidence of mass lesion, hemorrhage or acute infarction. Vascular: No hyperdense vessel or unexpected calcification. Skull: Normal. Negative for fracture or focal lesion. Sinuses/Orbits: No acute finding. Other: Small left frontal scalp hematoma is noted. CT CERVICAL SPINE FINDINGS Alignment: Mild grade 1 anterolisthesis of C4-5 is noted secondary to posterior facet joint hypertrophy. Skull base and vertebrae: No acute fracture. No primary bone lesion or focal pathologic process. Soft tissues and spinal canal: No prevertebral fluid or swelling. No visible canal hematoma. Disc levels: Severe degenerative disc disease is noted at C5-6, C6-7 and C7-T1 with anterior and posterior osteophyte formation. Upper chest: Negative. Other: Degenerative changes are seen involving posterior facet joints bilaterally. IMPRESSION: 1. Mild diffuse cortical atrophy. Mild chronic ischemic white matter disease. Small left frontal scalp hematoma. No acute intracranial abnormality seen. 2. Severe multilevel degenerative disc disease. No acute abnormality seen in the cervical spine. Electronically Signed   By: Marijo Conception M.D.   On: 07/26/2019 18:33   CT Cervical Spine Wo Contrast  Result Date: 07/26/2019 CLINICAL DATA:   Posttraumatic headache after fall. EXAM: CT HEAD WITHOUT CONTRAST CT CERVICAL SPINE WITHOUT CONTRAST TECHNIQUE: Multidetector CT imaging of the head and cervical spine was performed following the standard protocol without intravenous contrast. Multiplanar CT image reconstructions of the cervical spine were also generated. COMPARISON:  Jul 21, 2019. FINDINGS: CT HEAD FINDINGS Brain: Mild diffuse cortical atrophy is noted. Mild chronic ischemic white matter disease is noted. No mass effect or midline shift is noted. Ventricular size is within normal limits. There is no evidence of mass lesion, hemorrhage or acute infarction. Vascular: No hyperdense vessel or unexpected calcification. Skull: Normal. Negative for fracture or focal lesion. Sinuses/Orbits: No acute finding. Other: Small left frontal scalp hematoma is noted. CT CERVICAL SPINE FINDINGS Alignment: Mild grade 1 anterolisthesis of C4-5 is noted secondary to posterior facet joint hypertrophy. Skull base and vertebrae: No acute fracture. No primary bone lesion or  focal pathologic process. Soft tissues and spinal canal: No prevertebral fluid or swelling. No visible canal hematoma. Disc levels: Severe degenerative disc disease is noted at C5-6, C6-7 and C7-T1 with anterior and posterior osteophyte formation. Upper chest: Negative. Other: Degenerative changes are seen involving posterior facet joints bilaterally. IMPRESSION: 1. Mild diffuse cortical atrophy. Mild chronic ischemic white matter disease. Small left frontal scalp hematoma. No acute intracranial abnormality seen. 2. Severe multilevel degenerative disc disease. No acute abnormality seen in the cervical spine. Electronically Signed   By: Marijo Conception M.D.   On: 07/26/2019 18:33   DG Chest Portable 1 View  Result Date: 07/26/2019 CLINICAL DATA:  Hip fracture.  Screening.  Unwitnessed cough. EXAM: PORTABLE CHEST 1 VIEW COMPARISON:  Radiograph 03/24/2019, CT 03/26/2019 FINDINGS: Single lead left-sided  pacemaker. Median sternotomy and CABG. Mild cardiomegaly is chronic and unchanged. Aortic atherosclerosis. Mild chronic peribronchial thickening. No focal airspace disease. No pleural fluid or pneumothorax. No pulmonary edema. The bones are under mineralized. No acute osseous abnormalities are seen. Right lower rib fractures have surrounding callus formation and appear at least subacute. IMPRESSION: 1. No acute chest findings. 2. Post CABG with mild chronic cardiomegaly. 3. Right lower rib fractures have surrounding callus formation and appear at least subacute. Electronically Signed   By: Keith Rake M.D.   On: 07/26/2019 19:14   DG Hand Complete Left  Result Date: 07/26/2019 CLINICAL DATA:  Un witnessed fall, left hand and wrist bruising and skin tears EXAM: LEFT HAND - COMPLETE 3+ VIEW; LEFT WRIST - COMPLETE 3+ VIEW COMPARISON:  None. FINDINGS: Left wrist: Frontal, oblique, and lateral views of the left wrist are obtained as well as an ulnar deviated view. There is diffuse osteoarthritis throughout the left wrist. No fracture, subluxation, or dislocation. Soft tissues are unremarkable. Left hand: Frontal, oblique, lateral views of the left hand are obtained. Bones are diffusely osteopenic. No acute displaced fracture, subluxation, or dislocation. There is diffuse interphalangeal joint space narrowing, most pronounced at the first and second digits. Soft tissues are unremarkable. IMPRESSION: 1. 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 2. No acute displaced fracture. 3. Osteopenia. Electronically Signed   By: Randa Ngo M.D.   On: 07/26/2019 21:11   DG Hip Unilat W or Wo Pelvis 2-3 Views Left  Result Date: 07/26/2019 CLINICAL DATA:  Left-sided hip pain after fall EXAM: DG HIP (WITH OR WITHOUT PELVIS) 2-3V LEFT COMPARISON:  07/21/2019 FINDINGS: Acute mildly comminuted left intertrochanteric fracture without angulation. Femoral head is normally aligned. Pubic symphysis and rami are  intact. IMPRESSION: Acute left intertrochanteric fracture Electronically Signed   By: Donavan Foil M.D.   On: 07/26/2019 18:13   - pertinent xrays, CT, MRI studies were reviewed and independently interpreted  Positive ROS: All other systems have been reviewed and were otherwise negative with the exception of those mentioned in the HPI and as above.  Physical Exam: General: No acute distress Cardiovascular: No pedal edema Respiratory: No cyanosis, no use of accessory musculature GI: No organomegaly, abdomen is soft and non-tender Skin: No lesions in the area of chief complaint Neurologic: Sensation intact distally Psychiatric: [name] at baseline mood and affect Lymphatic: No axillary or cervical lymphadenopathy  MUSCULOSKELETAL:  - painful movement of the LLE - NVI  Assessment: Left intertroch fx  Plan: - based on discussion of r/b/a to surgical repair and reasonable expectations for surgical outcome, HCPOA, nephew Rudi Heap has consented to proceeding with surgery for patient - I have called cardiology  for preop clearance - echo ordered  Thank you for the consult and the opportunity to see Ms. Diana Eves. Eduard Roux, MD [facility] 7:25 AM

## 2019-07-27 NOTE — Progress Notes (Signed)
Initial Nutrition Assessment  DOCUMENTATION CODES:   Not applicable  INTERVENTION:    When diet resumed after surgery, add Ensure Enlive po BID, each supplement provides 350 kcal and 20 grams of protein.   MVI with minerals daily.  NUTRITION DIAGNOSIS:   Increased nutrient needs related to hip fracture as evidenced by estimated needs.  GOAL:   Patient will meet greater than or equal to 90% of their needs  MONITOR:   PO intake, Supplement acceptance, Skin  REASON FOR ASSESSMENT:   Malnutrition Screening Tool    ASSESSMENT:   86 yo female admitted with L hip fracture s/p fall at SNF. PMH includes HLD, HTN, CAD, neuropathy, A fib, CHF GERD.   Surgery to repair L hip fracture is scheduled for today. Unable to speak with patient or complete nutrition focused physical exam at this time.   Labs reviewed. K 3.3 (L) Medications reviewed and include KCl.  Weight encounters reviewed. Weight documented at 67.1 kg on 03/27/19, down to 56.2 kg 06/01/19. No current weight available. 16% weight loss within 3 months is significant for the time frame.   Suspect patient is malnourished; unable to obtain enough information at this time for identification of malnutrition.   Patient would benefit from a nutrition supplement to maximize oral intake to help meet increased nutrient needs for healing after surgery.   NUTRITION - FOCUSED PHYSICAL EXAM:  unable to complete  Diet Order:   Diet Order            Diet NPO time specified  Diet effective midnight              EDUCATION NEEDS:   No education needs have been identified at this time  Skin:  Skin Assessment: Reviewed RN Assessment(MASD to groin)  Last BM:  no BM documented  Height:   Ht Readings from Last 1 Encounters:  03/27/19 5\' 3"  (1.6 m)    Weight:   Wt Readings from Last 1 Encounters:  06/01/19 56.2 kg    Estimated Nutritional Needs:   Kcal:  1400-1600  Protein:  80-95 gm  Fluid:  >/= 1.5  L    Lucas Mallow, RD, LDN, CNSC Please refer to Amion for contact information.

## 2019-07-27 NOTE — Progress Notes (Signed)
  Echocardiogram 2D Echocardiogram has been performed.  Geoffery Lyons Swaim 07/27/2019, 9:10 AM

## 2019-07-27 NOTE — Consult Note (Signed)
Cardiology Consultation:   Patient ID: Cheryl Zamora MRN: [id number]; DOB: Jan 08, 1924  Admit date: 07/26/2019 Date of Consult: 07/27/2019  Primary Care Provider: Welford Roche, NP Primary Cardiologist: No primary care provider on file. [place] [place] 2016 Primary Electrophysiologist:  Virl Axe, MD    Patient Profile:   Cheryl Zamora is a 86 y.o. 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 Doristine Bosworth.  History of Present Illness:   Ms. Hannan with above hx and DCCV in past and prolonged QT interval on amiodarone. Underwent CABG x4 ( LIMA-LAD, SVG-D1, SVG-OM1-OM2) in 2012. Previously followed by Dr. Wynonia Lawman. Sees Dr. Caryl Comes as an outpatient. Had a PPM placed in 2018 along with AV junction ablation. Last seen in the office with Dr. Caryl Comes on 04/2017 for follow up and reported overall doing well. Blood pressures were noted to be soft and Toprol was stopped. She was continued on Xarelto at that time. Appears since that time that she has had several falls and has since been taken off her Xarelto as risk outweighed the benefits.   Presented to the ED on 5/27 after being found on the floor at her SNF. Notes indicate she was seen in the ED on 5/22 for an unwitnessed fall but no fracture was noted and she was discharged back to her SNF. Admission notes state she was found on the floor on her left side.   Information obtained from the chart as she has significant dementia. On my interview, she can only ask, "Where is she? Did she send you?"  Cannot answer any questions, though she denies current pain.  Labs in the ED showed stable electrolytes, Cr 0.9, WBC 13.5>>16.5, Hgb 15.4. EKG vpaced. Found to have a left intertrochanteric fracture. She was admitted to IM for further management. Orthopedics consulted and seen by Dr. Erlinda Hong with plans for  surgical repair. Cardiology has been consulted for pre op evaluation.   Echo this admission showed 50-55%, no rWMA noted, G2DD.    Past Medical History:  Diagnosis Date  . Atrial fibrillation with rapid ventricular response ([place]) 11/2012; 10/2013   a. s/p TEE/DCCV 11/2012 & 10/2013 b. amiodarone caused significant QT prlongation (>690msec)  . Chronic combined systolic and diastolic CHF (congestive heart failure) ([place])   . Coronary artery disease 2005   a. s/p CABG (2012)  . GERD (gastroesophageal reflux disease)   . Hyperlipidemia    pt denies this hx on 01/03/2015  . Hypertension   . Neuropathy   . Osteoarthritis of back     Past Surgical History:  Procedure Laterality Date  . ABDOMINAL HYSTERECTOMY    . AV NODE ABLATION N/A 11/16/2016   Procedure: AV Node Ablation;  Surgeon: Constance Haw, MD;  Location: [place] [facility];  Service: Cardiovascular;  Laterality: N/A;  . BACK SURGERY    . CARDIAC CATHETERIZATION  04/12/2003   [place] 60, LAD 90, D1 30, CFX 95->T, RCA OK  . CARDIOVERSION N/A 12/13/2012   Procedure: CARDIOVERSION;  Surgeon: Larey Dresser, MD;  Location: [facility] ENDOSCOPY;  Service: Cardiovascular;  Laterality: N/A;  . CARDIOVERSION N/A 11/13/2013   Procedure: CARDIOVERSION;  Surgeon: Reighlyn Spark, MD;  Location: [facility] ENDOSCOPY;  Service: Cardiovascular;  Laterality: N/A;  . CARDIOVERSION N/A 04/12/2014   Procedure: CARDIOVERSION;  Surgeon: Thayer Headings, MD;  Location: [place] ENDOSCOPY;  Service: Cardiovascular;  Laterality: N/A;  . CARDIOVERSION N/A 01/06/2015   Procedure: CARDIOVERSION;  Surgeon: Larey Dresser, MD;  Location: [place];  Service: Cardiovascular;  Laterality: N/A;  . CARDIOVERSION N/A 11/11/2016   Procedure: CARDIOVERSION;  Surgeon: Josue Hector, MD;  Location: [facility] ENDOSCOPY;  Service: Cardiovascular;  Laterality: N/A;  . CERVICAL DISC SURGERY  1960's   "ruptured disc'  . CORONARY ANGIOPLASTY    . CORONARY ARTERY BYPASS GRAFT  04/18/2003   x  4; LIMA-LAD, SVG-D1, SVG-OM1-OM2  . PACEMAKER IMPLANT N/A 11/16/2016   Procedure: Pacemaker Implant;  Surgeon: Constance Haw, MD;  Location: [place] [facility];  Service: Cardiovascular;  Laterality: N/A;  . TEE WITHOUT CARDIOVERSION N/A 12/13/2012   Procedure: TRANSESOPHAGEAL ECHOCARDIOGRAM (TEE);  Surgeon: Larey Dresser, MD;  Location: [facility] ENDOSCOPY;  Service: Cardiovascular;  Laterality: N/A;  . TEE WITHOUT CARDIOVERSION N/A 11/13/2013   Procedure: TRANSESOPHAGEAL ECHOCARDIOGRAM (TEE);  Surgeon: Nidya Spark, MD;  Location: [place];  Service: Cardiovascular;  Laterality: N/A;  . TEE WITHOUT CARDIOVERSION N/A 11/11/2016   Procedure: TRANSESOPHAGEAL ECHOCARDIOGRAM (TEE);  Surgeon: Josue Hector, MD;  Location: [facility] ENDOSCOPY;  Service: Cardiovascular;  Laterality: N/A;  . TONSILLECTOMY       Home Medications:  Prior to Admission medications   Medication Sig Start Date End Date Taking? Authorizing Provider  Amino Acids-Protein Hydrolys (FEEDING SUPPLEMENT, PRO-STAT SUGAR FREE 64,) LIQD Take 30 mLs by mouth daily.   Yes [provider]  Cholecalciferol (VITAMIN D3) 1000 units CAPS Take 1,000 Units by mouth daily.   Yes [provider]  memantine (NAMENDA) 10 MG tablet Take 10 mg by mouth daily.    Yes [provider]  metoprolol tartrate (LOPRESSOR) 25 MG tablet Take 0.5 tablets (12.5 mg total) by mouth 2 (two) times daily. 03/29/19  Yes Nita Sells, MD  Multiple Vitamin (MULTIVITAMIN) tablet Take 1 tablet by mouth daily.   Yes [provider]  nystatin (NYSTATIN) powder Apply 1 application topically every evening.   Yes [provider]  acetaminophen (TYLENOL) 325 MG tablet Take 650 mg by mouth every 6 (six) hours as needed for mild pain.    [provider]    Inpatient Medications: Scheduled Meds: . feeding supplement (ENSURE ENLIVE)  237 mL Oral BID BM  . feeding supplement  296 mL Oral Once  . memantine  10 mg  Oral Daily  . metoprolol tartrate  12.5 mg Oral BID  . [START ON 07/28/2019] multivitamin with minerals  1 tablet Oral Daily  . povidone-iodine  2 application Topical Once  . tranexamic acid (CYKLOKAPRON) topical - INTRAOP  2,000 mg Topical To OR   Continuous Infusions: .  ceFAZolin (ANCEF) IV    . methocarbamol (ROBAXIN) IV    . potassium chloride 10 mEq (07/27/19 1109)  . tranexamic acid     PRN Meds: hydrALAZINE, HYDROcodone-acetaminophen, methocarbamol **OR** methocarbamol (ROBAXIN) IV, morphine injection  Allergies:    Allergies  Allergen Reactions  . Digoxin And Related Other (See Comments)    Increased HR  . Amiodarone Other (See Comments)    Excessive QT prolongation, nausea  . Codeine Nausea And Vomiting  . Penicillins Nausea Only    Did it involve swelling of the face/tongue/throat, SOB, or low BP? Unknown Did it involve sudden or severe rash/hives, skin peeling, or any reaction on the inside of your mouth or nose? Unknown Did you need to seek medical attention at a hospital or doctor's office?  Unknown When did it last happen? If all above answers are "NO", may proceed with cephalosporin use.    Social History:   Social History   Socioeconomic History  . Marital status: Legally Separated    Spouse name: Not on file  . Number of children: Not on file  . Years of education: Not on file  . Highest education level: Not on file  Occupational History  . Occupation: Retired  Tobacco Use  . Smoking status: Former Smoker    Packs/day: 1.00    Years: 60.00    Pack years: 60.00    Types: Cigarettes  . Smokeless tobacco: Never Used  Substance and Sexual Activity  . Alcohol use: Yes    Comment: 01/03/2015 "might have a beer a couple times/yr"  . Drug use: No  . Sexual activity: Never  Other Topics Concern  . Not on file  Social History Narrative   Pt son helps with her care.   Social Determinants of Health   Financial Resource Strain:   . Difficulty of  Paying Living Expenses:   Food Insecurity:   . Worried About Charity fundraiser in the Last Year:   . Arboriculturist in the Last Year:   Transportation Needs:   . Film/video editor (Medical):   Marland Kitchen Lack of Transportation (Non-Medical):   Physical Activity:   . Days of Exercise per Week:   . Minutes of Exercise per Session:   Stress:   . Feeling of Stress :   Social Connections:   . Frequency of Communication with Friends and Family:   . Frequency of Social Gatherings with Friends and Family:   . Attends Religious Services:   . Active Member of Clubs or Organizations:   . Attends Archivist Meetings:   Marland Kitchen Marital Status:   Intimate Partner Violence:   . Fear of Current or Ex-Partner:   . Emotionally Abused:   Marland Kitchen Physically Abused:   . Sexually Abused:     Family History:    Family History  Problem Relation Age of Onset  . Heart attack Mother   . Heart attack Father   . Heart attack Brother   . Heart attack Brother   . Heart attack Brother   . Heart attack Brother      ROS:  Unable to be obtained due to dementia.  Physical Exam/Data:   Vitals:   07/26/19 2353 07/27/19 0320 07/27/19 0400 07/27/19 0945  BP: (!) 164/73 (!) 174/70 (!) 150/64 (!) 175/71  Pulse: 72 70 69 87  Resp: 16 18  17   Temp: 97.7 F (36.5 C) 97.6 F (36.4 C)  98.1 F (36.7 C)  TempSrc: Oral   Oral  SpO2: 98% 97%  97%    Intake/Output Summary (Last 24 hours) at 07/27/2019 1124 Last data filed at 07/27/2019 0500 Gross per 24 hour  Intake 237.9 ml  Output --  Net 237.9 ml   Last 3 Weights 06/01/2019 03/27/2019 03/26/2019  Weight (lbs) 124 lb 147 lb 14.9 oz 148 lb  Weight (kg) 56.246 kg 67.1 kg 67.132 kg     There is no height or weight on file to calculate BMI.  General:  Frail appearing elderly woman, multiple ecchymoses across face and arms. HEENT: normal Neck: no JVD visible Endocrine:  No thryomegaly appreciated Vascular: RA pulses 2+ bilaterally without bruits  Cardiac:   normal S1, S2; RRR; 1/6 SEM Lungs:  clear to auscultation bilaterally, no wheezing, rhonchi or rales  Abd: soft, nontender, no hepatomegaly  Ext: no edema Musculoskeletal:  Immobile due to fracture Skin: warm and dry  Neuro: no focal abnormalities noted Psych:  Not oriented  EKG:  The EKG was personally reviewed and demonstrates:  Vpaced Telemetry:  Telemetry was personally reviewed and demonstrates:  Not on telemetry  Relevant CV Studies:  Echo: 07/27/19  IMPRESSIONS    1. Left ventricular ejection fraction, by estimation, is 50 to 55%. The  left ventricle has low normal function. The left ventricle has no regional  wall motion abnormalities. Left ventricular diastolic parameters are  consistent with Grade II diastolic  dysfunction (pseudonormalization). Elevated left ventricular end-diastolic  pressure.  2. Right ventricular systolic function is mildly reduced. The right  ventricular size is normal. There is mildly elevated pulmonary artery  systolic pressure. The estimated right ventricular systolic pressure is  25.8 mmHg.  3. The mitral valve is abnormal. Mild mitral valve regurgitation.  4. The aortic valve is tricuspid. Aortic valve regurgitation is not  visualized. Mild aortic valve sclerosis is present, with no evidence of  aortic valve stenosis.  5. The inferior vena cava is normal in size with greater than 50%  respiratory variability, suggesting right atrial pressure of 3 mmHg.   Laboratory Data:  High Sensitivity Troponin:  No results for input(s): TROPONINIHS in the last 720 hours.   Chemistry Recent Labs  Lab 07/26/19 1900 07/27/19 0600  NA 139 137  K 3.5 3.3*  CL 102 102  CO2 25 26  GLUCOSE 145* 155*  BUN 14 12  CREATININE 0.90 0.88  CALCIUM 9.4 9.6  GFRNONAA 55* 57*  GFRAA >60 >60  ANIONGAP 12 9    Recent Labs  Lab 07/26/19 1900  PROT 7.0  ALBUMIN 3.9  AST 19  ALT 14  ALKPHOS 69  BILITOT 1.0   Hematology Recent Labs  Lab  07/26/19 1900 07/27/19 0600  WBC 13.5* 16.5*  RBC 5.48* 5.22*  HGB 15.4* 14.5  HCT 49.2* 45.2  MCV 89.8 86.6  MCH 28.1 27.8  MCHC 31.3 32.1  RDW 14.9 14.6  PLT 231 237   BNPNo results for input(s): BNP, PROBNP in the last 168 hours.  DDimer No results for input(s): DDIMER in the last 168 hours.   Radiology/Studies:  DG Thoracic Spine 2 View  Result Date: 07/26/2019 CLINICAL DATA:  Un witnessed fall, found down EXAM: THORACIC SPINE 2 VIEWS COMPARISON:  07/21/2019 FINDINGS: Frontal and cross-table lateral views of the thoracic spine are obtained. Lateral views are suboptimal due to positioning and overlying structures. Stable right convex curvature centered in the lower thoracic spine. Otherwise alignment appears anatomic. There is extensive multilevel thoracic spondylosis unchanged. No evidence of acute fracture. IMPRESSION: 1. No gross displaced fracture. Evaluation limited by technique and positioning. Electronically Signed   By: Randa Ngo M.D.   On: 07/26/2019 19:12   DG Wrist Complete Left  Result Date: 07/26/2019 CLINICAL DATA:  Un witnessed fall, left hand and wrist bruising and skin tears EXAM: LEFT HAND - COMPLETE 3+ VIEW; LEFT WRIST - COMPLETE 3+ VIEW COMPARISON:  None. FINDINGS: Left wrist: Frontal, oblique, and lateral views of the left wrist are obtained as well as an ulnar deviated view. There is diffuse osteoarthritis throughout the left wrist. No fracture, subluxation, or dislocation. Soft tissues are unremarkable. Left hand: Frontal, oblique, lateral views of the left hand are obtained. Bones are diffusely osteopenic. No acute displaced fracture, subluxation, or dislocation. There is diffuse interphalangeal joint space narrowing, most pronounced at  the first and second digits. Soft tissues are unremarkable. IMPRESSION: 1. 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 2. No acute displaced fracture. 3. Osteopenia. Electronically Signed   By: Randa Ngo M.D.   On:  07/26/2019 21:11   CT Head Wo Contrast  Result Date: 07/26/2019 CLINICAL DATA:  Posttraumatic headache after fall. EXAM: CT HEAD WITHOUT CONTRAST CT CERVICAL SPINE WITHOUT CONTRAST TECHNIQUE: Multidetector CT imaging of the head and cervical spine was performed following the standard protocol without intravenous contrast. Multiplanar CT image reconstructions of the cervical spine were also generated. COMPARISON:  Jul 21, 2019. FINDINGS: CT HEAD FINDINGS Brain: Mild diffuse cortical atrophy is noted. Mild chronic ischemic white matter disease is noted. No mass effect or midline shift is noted. Ventricular size is within normal limits. There is no evidence of mass lesion, hemorrhage or acute infarction. Vascular: No hyperdense vessel or unexpected calcification. Skull: Normal. Negative for fracture or focal lesion. Sinuses/Orbits: No acute finding. Other: Small left frontal scalp hematoma is noted. CT CERVICAL SPINE FINDINGS Alignment: Mild grade 1 anterolisthesis of C4-5 is noted secondary to posterior facet joint hypertrophy. Skull base and vertebrae: No acute fracture. No primary bone lesion or focal pathologic process. Soft tissues and spinal canal: No prevertebral fluid or swelling. No visible canal hematoma. Disc levels: Severe degenerative disc disease is noted at C5-6, C6-7 and C7-T1 with anterior and posterior osteophyte formation. Upper chest: Negative. Other: Degenerative changes are seen involving posterior facet joints bilaterally. IMPRESSION: 1. Mild diffuse cortical atrophy. Mild chronic ischemic white matter disease. Small left frontal scalp hematoma. No acute intracranial abnormality seen. 2. Severe multilevel degenerative disc disease. No acute abnormality seen in the cervical spine. Electronically Signed   By: Marijo Conception M.D.   On: 07/26/2019 18:33   CT Cervical Spine Wo Contrast  Result Date: 07/26/2019 CLINICAL DATA:  Posttraumatic headache after fall. EXAM: CT HEAD WITHOUT CONTRAST  CT CERVICAL SPINE WITHOUT CONTRAST TECHNIQUE: Multidetector CT imaging of the head and cervical spine was performed following the standard protocol without intravenous contrast. Multiplanar CT image reconstructions of the cervical spine were also generated. COMPARISON:  Jul 21, 2019. FINDINGS: CT HEAD FINDINGS Brain: Mild diffuse cortical atrophy is noted. Mild chronic ischemic white matter disease is noted. No mass effect or midline shift is noted. Ventricular size is within normal limits. There is no evidence of mass lesion, hemorrhage or acute infarction. Vascular: No hyperdense vessel or unexpected calcification. Skull: Normal. Negative for fracture or focal lesion. Sinuses/Orbits: No acute finding. Other: Small left frontal scalp hematoma is noted. CT CERVICAL SPINE FINDINGS Alignment: Mild grade 1 anterolisthesis of C4-5 is noted secondary to posterior facet joint hypertrophy. Skull base and vertebrae: No acute fracture. No primary bone lesion or focal pathologic process. Soft tissues and spinal canal: No prevertebral fluid or swelling. No visible canal hematoma. Disc levels: Severe degenerative disc disease is noted at C5-6, C6-7 and C7-T1 with anterior and posterior osteophyte formation. Upper chest: Negative. Other: Degenerative changes are seen involving posterior facet joints bilaterally. IMPRESSION: 1. Mild diffuse cortical atrophy. Mild chronic ischemic white matter disease. Small left frontal scalp hematoma. No acute intracranial abnormality seen. 2. Severe multilevel degenerative disc disease. No acute abnormality seen in the cervical spine. Electronically Signed   By: Marijo Conception M.D.   On: 07/26/2019 18:33   DG Chest Portable 1 View  Result Date: 07/26/2019 CLINICAL DATA:  Hip fracture.  Screening.  Unwitnessed cough. EXAM: PORTABLE CHEST 1 VIEW COMPARISON:  Radiograph  03/24/2019, CT 03/26/2019 FINDINGS: Single lead left-sided pacemaker. Median sternotomy and CABG. Mild cardiomegaly is  chronic and unchanged. Aortic atherosclerosis. Mild chronic peribronchial thickening. No focal airspace disease. No pleural fluid or pneumothorax. No pulmonary edema. The bones are under mineralized. No acute osseous abnormalities are seen. Right lower rib fractures have surrounding callus formation and appear at least subacute. IMPRESSION: 1. No acute chest findings. 2. Post CABG with mild chronic cardiomegaly. 3. Right lower rib fractures have surrounding callus formation and appear at least subacute. Electronically Signed   By: Keith Rake M.D.   On: 07/26/2019 19:14   DG Hand Complete Left  Result Date: 07/26/2019 CLINICAL DATA:  Un witnessed fall, left hand and wrist bruising and skin tears EXAM: LEFT HAND - COMPLETE 3+ VIEW; LEFT WRIST - COMPLETE 3+ VIEW COMPARISON:  None. FINDINGS: Left wrist: Frontal, oblique, and lateral views of the left wrist are obtained as well as an ulnar deviated view. There is diffuse osteoarthritis throughout the left wrist. No fracture, subluxation, or dislocation. Soft tissues are unremarkable. Left hand: Frontal, oblique, lateral views of the left hand are obtained. Bones are diffusely osteopenic. No acute displaced fracture, subluxation, or dislocation. There is diffuse interphalangeal joint space narrowing, most pronounced at the first and second digits. Soft tissues are unremarkable. IMPRESSION: 1. 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 2. No acute displaced fracture. 3. Osteopenia. Electronically Signed   By: Randa Ngo M.D.   On: 07/26/2019 21:11   ECHOCARDIOGRAM COMPLETE  Result Date: 07/27/2019    ECHOCARDIOGRAM REPORT   Patient Name:   YANET BALLIET Date of Exam: 07/27/2019 Medical Rec #:  [id number]      Height:       63.0 in Accession #:    [id number]     Weight:       124.0 lb Date of Birth:  [birth year]       BSA:          1.578 m Patient Age:    89 years       BP:           150/64 mmHg Patient Gender: F              HR:           71 bpm.  Exam Location:  Inpatient Procedure: 2D Echo, Cardiac Doppler and Color Doppler STAT ECHO Indications:    CHF-Acute Diastolic 767.34 / L93.79  History:        Patient has prior history of Echocardiogram examinations, most                 recent 04/12/2014. CHF, CAD, Pacemaker, Arrythmias:Atrial                 Fibrillation; Risk Factors:Hypertension and Former Smoker. DVT.                 GERD.  Sonographer:    Vickie Epley RDCS Referring Phys: [id number] [place]  1. Left ventricular ejection fraction, by estimation, is 50 to 55%. The left ventricle has low normal function. The left ventricle has no regional wall motion abnormalities. Left ventricular diastolic parameters are consistent with Grade II diastolic dysfunction (pseudonormalization). Elevated left ventricular end-diastolic pressure.  2. Right ventricular systolic function is mildly reduced. The right ventricular size is normal. There is mildly elevated pulmonary artery systolic pressure. The estimated right ventricular systolic pressure is 35.3 mmHg.  3. The mitral valve is abnormal. Mild mitral valve  regurgitation.  4. The aortic valve is tricuspid. Aortic valve regurgitation is not visualized. Mild aortic valve sclerosis is present, with no evidence of aortic valve stenosis.  5. The inferior vena cava is normal in size with greater than 50% respiratory variability, suggesting right atrial pressure of 3 mmHg. FINDINGS  Left Ventricle: Left ventricular ejection fraction, by estimation, is 50 to 55%. The left ventricle has low normal function. The left ventricle has no regional wall motion abnormalities. The left ventricular internal cavity size was normal in size. There is no left ventricular hypertrophy. Abnormal (paradoxical) septal motion, consistent with RV pacemaker. Left ventricular diastolic parameters are consistent with Grade II diastolic dysfunction (pseudonormalization). Elevated left ventricular end-diastolic pressure. Right  Ventricle: The right ventricular size is normal. No increase in right ventricular wall thickness. Right ventricular systolic function is mildly reduced. There is mildly elevated pulmonary artery systolic pressure. The tricuspid regurgitant velocity  is 3.13 m/s, and with an assumed right atrial pressure of 3 mmHg, the estimated right ventricular systolic pressure is 16.1 mmHg. Left Atrium: Left atrial size was normal in size. Right Atrium: Right atrial size was normal in size. Pericardium: There is no evidence of pericardial effusion. Mitral Valve: The mitral valve is abnormal. There is mild thickening of the mitral valve leaflet(s). Mild mitral valve regurgitation. Tricuspid Valve: The tricuspid valve is grossly normal. Tricuspid valve regurgitation is mild. Aortic Valve: The aortic valve is tricuspid. Aortic valve regurgitation is not visualized. Mild aortic valve sclerosis is present, with no evidence of aortic valve stenosis. Pulmonic Valve: The pulmonic valve was grossly normal. Pulmonic valve regurgitation is trivial. Aorta: The aortic root and ascending aorta are structurally normal, with no evidence of dilitation. Venous: The inferior vena cava is normal in size with greater than 50% respiratory variability, suggesting right atrial pressure of 3 mmHg. IAS/Shunts: No atrial level shunt detected by color flow Doppler. Additional Comments: A pacer wire is visualized.  LEFT VENTRICLE PLAX 2D LVIDd:         4.00 cm LVIDs:         3.10 cm LV PW:         0.80 cm LV IVS:        0.80 cm LVOT diam:     1.70 cm LV SV:         30 LV SV Index:   19 LVOT Area:     2.27 cm  LV Volumes (MOD) LV vol d, MOD A2C: 48.9 ml LV vol d, MOD A4C: 43.9 ml LV vol s, MOD A2C: 22.9 ml LV vol s, MOD A4C: 21.0 ml LV SV MOD A2C:     26.0 ml LV SV MOD A4C:     43.9 ml LV SV MOD BP:      24.4 ml RIGHT VENTRICLE RV S prime:     6.97 cm/s TAPSE (M-mode): 0.9 cm LEFT ATRIUM             Index       RIGHT ATRIUM           Index LA diam:         4.40 cm 2.79 cm/m  RA Area:     13.90 cm LA Vol (A2C):   27.0 ml 17.11 ml/m RA Volume:   38.10 ml  24.14 ml/m LA Vol (A4C):   25.5 ml 16.16 ml/m LA Biplane Vol: 26.2 ml 16.60 ml/m  AORTIC VALVE LVOT Vmax:   59.50 cm/s LVOT Vmean:  42.200 cm/s LVOT VTI:  0.133 m  AORTA Ao Root diam: 2.90 cm MR Peak grad: 133.6 mmHg  TRICUSPID VALVE MR Vmax:      578.00 cm/s TR Peak grad:   39.2 mmHg                           TR Vmax:        313.00 cm/s                            SHUNTS                           Systemic VTI:  0.13 m                           Systemic Diam: 1.70 cm Lyman Bishop MD Electronically signed by Lyman Bishop MD Signature Date/Time: 07/27/2019/9:19:07 AM    Final    DG Hip Unilat W or Wo Pelvis 2-3 Views Left  Result Date: 07/26/2019 CLINICAL DATA:  Left-sided hip pain after fall EXAM: DG HIP (WITH OR WITHOUT PELVIS) 2-3V LEFT COMPARISON:  07/21/2019 FINDINGS: Acute mildly comminuted left intertrochanteric fracture without angulation. Femoral head is normally aligned. Pubic symphysis and rami are intact. IMPRESSION: Acute left intertrochanteric fracture Electronically Signed   By: Donavan Foil M.D.   On: 07/26/2019 18:13    Assessment and Plan:   ZEPHANIAH ENYEART is a 86 y.o. 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 Doristine Bosworth.  1. Preop evaluation with left hip fx: does have cardiac hx of CABG in 2012. Has been in SNF and very limited in her ADLs, freq falls. Unable to obtain anginal symptoms with her hx of dementia. Echo showed normal EF with no rWMA, G2DD. At this time, with her advanced age and dementia would not recommend further cardiac evaluation prior to surgery. Her RCRI is at least 1. Difficult to risk stratify as she cannot describe any symptoms, but she is at least moderate risk. However, there is no modifiable risk  at this time, recommend proceeding to surgery given fracture.  2. Afib: previously tried on several antiarrhythmics without successful. Had been on Xarelto in the past, but stopped 2/2 to freq falls which is appropriate.  -- Vpaced on admission, not on telemetry  3. Hx of CABG x4 in 2012: medical therapy limited in the past. Toprol stopped back in 2019 2/2 to low blood pressures.   4. Dementia: on Namenda  [facility] will sign off, but call us postoperatively if questions arise.  For questions or updates, please contact [place] Please consult [email or web address] for contact info under   Signed, Buford Dresser, MD  07/27/2019 11:24 AM

## 2019-07-27 NOTE — Anesthesia Procedure Notes (Signed)
Procedure Name: Intubation Date/Time: 07/27/2019 1:08 PM Performed by: Clearnce Sorrel, CRNA Pre-anesthesia Checklist: Patient identified, Emergency Drugs available, Suction available, Patient being monitored and Timeout performed Patient Re-evaluated:Patient Re-evaluated prior to induction Oxygen Delivery Method: Circle system utilized Preoxygenation: Pre-oxygenation with 100% oxygen Induction Type: IV induction Ventilation: Mask ventilation without difficulty Laryngoscope Size: Mac and 3 Grade View: Grade I Tube type: Oral Tube size: 7.0 mm Number of attempts: 1 Airway Equipment and Method: Stylet Placement Confirmation: ETT inserted through vocal cords under direct vision,  positive ETCO2 and breath sounds checked- equal and bilateral Secured at: 23 cm Tube secured with: Tape Dental Injury: Teeth and Oropharynx as per pre-operative assessment

## 2019-07-27 NOTE — Op Note (Signed)
   Date of Surgery: 07/27/2019  INDICATIONS: Ms. Herbel is a 86 y.o.-year-old female who sustained a left hip fracture. The risks and benefits of the procedure discussed with the family prior to the procedure and all questions were answered; consent was obtained.  PREOPERATIVE DIAGNOSIS: left intertrochanteric fracture   POSTOPERATIVE DIAGNOSIS: Same   PROCEDURE: Open treatment of intertrochanteric fracture with intramedullary implant. CPT [phone]   SURGEON: N. Eduard Roux, M.D.   ASSIST: None  ANESTHESIA: general   IV FLUIDS AND URINE: See anesthesia record   ESTIMATED BLOOD LOSS: 100 cc  IMPLANTS: [employer] InterTAN 10 x 36, 90/85 lag screws  DRAINS: None.   COMPLICATIONS: see description of procedure.   DESCRIPTION OF PROCEDURE: The patient was brought to the operating room and placed supine on the operating table. The patient's leg had been signed prior to the procedure. The patient had the anesthesia placed by the anesthesiologist. The prep verification and incision time-outs were performed to confirm that this was the correct patient, site, side and location. The patient had an SCD on the opposite lower extremity. The patient did receive antibiotics prior to the incision and was re-dosed during the procedure as needed at indicated intervals. The patient was positioned on the fracture table with the table in traction and internal rotation to reduce the hip. The well leg was placed in a scissor position and all bony prominences were well-padded. The patient had the lower extremity prepped and draped in the standard surgical fashion. The incision was made 4 finger breadths superior to the greater trochanter. A guide pin was inserted into the tip of the greater trochanter under fluoroscopic guidance. An opening reamer was used to gain access to the femoral canal. The nail length was measured and inserted down the femoral canal to its proper depth. The appropriate version of insertion  for the lag screw was found under fluoroscopy. A pin was inserted up the femoral neck through the jig. Then, a second antirotation pin was inserted inferior to the first pin. The length of the lag screw was then measured. The lag screw was inserted as near to center-center in the head as possible. The antirotation pin was then taken out and an interdigitating compression screw was placed in its place. The leg was taken out of traction, then the interdigitating compression screw was used to compress across the fracture. Compression was visualized on serial xrays.  A distal interlocking screw was placed using the perfect circle technique.  The wound was copiously irrigated with saline and the subcutaneous layer closed with 2.0 vicryl and the skin was reapproximated with staples. The wounds were cleaned and dried a final time and a sterile dressing was placed. The hip was taken through a range of motion at the end of the case under fluoroscopic imaging to visualize the approach-withdraw phenomenon and confirm implant length in the head. The patient was then awakened from anesthesia and taken to the recovery room in stable condition. All counts were correct at the end of the case.   POSTOPERATIVE PLAN: The patient will be weight bearing as tolerated and will return in 2 weeks for staple removal and the patient will receive DVT prophylaxis based on other medications, activity level, and risk ratio of bleeding to thrombosis.   Azucena Cecil, MD [facility] 2:08 PM

## 2019-07-27 NOTE — Anesthesia Preprocedure Evaluation (Addendum)
Anesthesia Evaluation  Patient identified by MRN, date of birth, ID band Patient confused    Reviewed: Allergy & Precautions, NPO status , Patient's Chart, lab work & pertinent test results  Airway Mallampati: II  TM Distance: >3 FB Neck ROM: Full    Dental no notable dental hx.    Pulmonary neg pulmonary ROS, former smoker,    Pulmonary exam normal breath sounds clear to auscultation       Cardiovascular hypertension, Pt. on home beta blockers + CAD, + CABG, +CHF and + DVT  Normal cardiovascular exam+ pacemaker  Rhythm:Regular Rate:Normal  ECG: rate 70  ECHO: 1. Left ventricular ejection fraction, by estimation, is 50 to 55%. The left ventricle has low normal function. The left ventricle has no regional wall motion abnormalities. Left ventricular diastolic parameters are consistent with Grade II diastolic dysfunction (pseudonormalization). Elevated left ventricular end-diastolic pressure. 2. Right ventricular systolic function is mildly reduced. The right ventricular size is normal. There is mildly elevated pulmonary artery systolic pressure. The estimated right ventricular systolic pressure is 33.8 mmHg. 3. The mitral valve is abnormal. Mild mitral valve regurgitation. 4. The aortic valve is tricuspid. Aortic valve regurgitation is not visualized. Mild aortic valve sclerosis is present, with no evidence of aortic valve stenosis. 5. The inferior vena cava is normal in size with greater than 50% respiratory variability, suggesting right atrial pressure of 3 mmHg.   Neuro/Psych Dementia negative neurological ROS     GI/Hepatic Neg liver ROS, GERD  ,  Endo/Other  negative endocrine ROS  Renal/GU negative Renal ROS     Musculoskeletal negative musculoskeletal ROS (+)   Abdominal   Peds  Hematology On xarelto   Anesthesia Other Findings LEFT HIP/ FEMUR FRACTURE  Reproductive/Obstetrics                             Anesthesia Physical Anesthesia Plan  ASA: IV  Anesthesia Plan: General   Post-op Pain Management:    Induction: Intravenous  PONV Risk Score and Plan: 3 and Ondansetron, Dexamethasone and Treatment may vary due to age or medical condition  Airway Management Planned: Oral ETT  Additional Equipment:   Intra-op Plan:   Post-operative Plan: Extubation in OR  Informed Consent: I have reviewed the patients History and Physical, chart, labs and discussed the procedure including the risks, benefits and alternatives for the proposed anesthesia with the patient or authorized representative who has indicated his/her understanding and acceptance.    Discussed DNR with power of attorney and Suspend DNR.   Dental advisory given and Consent reviewed with POA  Plan Discussed with: CRNA  Anesthesia Plan Comments: (Anesthetic plan discussed with nephew. On anticoagulation per nephew. Not on anticoagulation per chart. Will proceed with general anesthesia. )       Anesthesia Quick Evaluation

## 2019-07-27 NOTE — Progress Notes (Signed)
SLP Cancellation Note  Patient Details Name: Cheryl Zamora MRN: [id number] DOB: August 01, 1923   Cancelled treatment:       Reason Eval/Treat Not Completed: Medical issues which prohibited therapy. Orders received for swallow eval. Per chart, pt is NPO for procedure today. Will f/u as able.    Osie Bond., M.A. [facility] Pager [phone] Office [phone]  07/27/2019, 8:01 AM

## 2019-07-27 NOTE — Transfer of Care (Signed)
Immediate Anesthesia Transfer of Care Note  Patient: Cheryl Zamora  Procedure(s) Performed: INTRAMEDULLARY (IM) NAIL INTERTROCHANTRIC (Left Hip)  Patient Location: PACU  Anesthesia Type:General  Level of Consciousness: awake  Airway & Oxygen Therapy: Patient Spontanous Breathing and Patient connected to face mask oxygen  Post-op Assessment: Report given to RN and Post -op Vital signs reviewed and stable  Post vital signs: Reviewed and stable  Last Vitals:  Vitals Value Taken Time  BP    Temp    Pulse    Resp    SpO2      Last Pain:  Vitals:   07/27/19 0945  TempSrc: Oral  PainSc:          Complications: No apparent anesthesia complications

## 2019-07-27 NOTE — Progress Notes (Signed)
PROGRESS NOTE    Cheryl Zamora  WUJ:[id number] DOB: 1924-01-27 DOA: 07/26/2019 PCP: Welford Roche, NP   Brief Narrative:  86 year old female with past medical history of dementia, A. fib not on any anticoagulation, history of CAD status post CABG, hypertension, CKD stage IIIa, sick sinus syndrome with pacemaker, chronic diastolic CHF presented to ED with unwitnessed fall and subsequently was diagnosed with acute left intertrochanteric fracture.  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  Chest x-ray showed right lower rib fracture which appears to be subacute.  She was admitted to hospital service and orthopedics were consulted.  Assessment & Plan:   Active Problems:   PAF (paroxysmal atrial fibrillation) (HCC)   Chronic diastolic CHF (congestive heart failure), NYHA class 3 (HCC)   Essential hypertension   CKD (chronic kidney disease) stage 3, GFR 30-59 ml/min   CAD, multiple vessel, hx CABG 2012   Paroxysmal atrial fibrillation (HCC)   Chronic diastolic (congestive) heart failure (HCC)   Nodule of middle lobe of right lung   Closed left hip fracture (HCC)   Leukocytosis    Closed left hip fracture ([place]) -orthopedics on board.  They consulted cardiology for cardiac clearance and patient was cleared.  Currently in surgery.  Management per orthopedics.   Essential hypertension: Slightly elevated preop.  Continue metoprolol and monitor.  Will add as needed hydralazine.   CAD, multiple vessel, hx CABG 2012 -chronic stable patient not reporting any chest pain or shortness of breath.  Seen and cleared by cardiology.    PAF (paroxysmal atrial fibrillation) (HCC)-not on anticoagulation given risk of falls as well as hematuria.  Continue metoprolol blood pressure well.  Currently in sinus rhythm.      Chronic diastolic CHF (congestive heart failure), NYHA class 3 (HCC) -currently appears to be euvolemic avoid over aggressive fluids Appreciate cardiology input regarding perioperative cardiac monitoring and management    CKD (chronic kidney disease) stage 3a: stable continue to monitor   Leukocytosis -it is chronic.  No signs of infection.  Monitor.  Hypokalemia: Replaced.  Recheck in the morning.  Right lower lobe fractures possibly subacute patient with multiple falls.  She has no symptoms or any pain.  Would benefit from incentive spirometer monitor for any evidence of pneumonia postoperatively supportive management  Dementia chronic expect some degree of sundowning while hospitalized.Resume Namenda.  DVT prophylaxis: Recommend Lovenox postop.   Code Status: DNR  Family Communication:  None present at bedside.  [name] from: SNF Disposition Plan: SNF Barriers to discharge: Orthopedic surgical intervention  Status is: Inpatient  Remains inpatient appropriate because:Inpatient level of care appropriate due to severity of illness   Dispo: The patient is from: SNF              Anticipated d/c is to: SNF              Anticipated d/c date is: 2 days              Patient currently is not medically stable to d/c.         Estimated body mass index is 21.97 kg/m as calculated from the following:   Height as of 03/27/19: 5\' 3"  (1.6 m).   Weight as of 06/01/19: 56.2 kg.  Pressure Ulcer 01/13/15 Stage I -  Intact skin with non-blanchable redness of a  localized area usually over a bony prominence. (Active)  01/13/15 0839  Location: Buttocks  Location Orientation: Right;Left  Staging: Stage I -  Intact skin with non-blanchable redness of a localized area usually over a bony prominence.  Wound Description (Comments):   Present on Admission: Yes     Nutritional status:  Nutrition Problem: Increased nutrient needs Etiology: hip fracture   Signs/Symptoms: estimated  needs   Interventions: Refer to RD note for recommendations    Consultants:   Orthopedics  Procedures:   None  Antimicrobials:  Anti-infectives (From admission, onward)   Start     Dose/Rate Route Frequency Ordered Stop   07/27/19 1315  ceFAZolin (ANCEF) IVPB 2g/100 mL premix     2 g 200 mL/hr over 30 Minutes Intravenous To [facility] 07/26/19 2334 07/28/19 1315         Subjective: Seen and examined preoperatively.  Patient was alert and oriented to place and person but not time.  She complained of left arm pain.  No other complaint.  Objective: Vitals:   07/26/19 2353 07/27/19 0320 07/27/19 0400 07/27/19 0945  BP: (!) 164/73 (!) 174/70 (!) 150/64 (!) 175/71  Pulse: 72 70 69 87  Resp: 16 18  17   Temp: 97.7 F (36.5 C) 97.6 F (36.4 C)  98.1 F (36.7 C)  TempSrc: Oral   Oral  SpO2: 98% 97%  97%    Intake/Output Summary (Last 24 hours) at 07/27/2019 1157 Last data filed at 07/27/2019 0500 Gross per 24 hour  Intake 237.9 ml  Output --  Net 237.9 ml   There were no vitals filed for this visit.  Examination:  General exam: Appears calm and comfortable but slightly confused Respiratory system: Clear to auscultation. Respiratory effort normal. Cardiovascular system: S1 & S2 heard, RRR. No JVD, murmurs, rubs, gallops or clicks. No pedal edema. Gastrointestinal system: Abdomen is nondistended, soft and nontender. No organomegaly or masses felt. Normal bowel sounds heard. Central nervous system: Alert and oriented. No focal neurological deficits. Extremities: Left lower extremity shortened and externally rotated Skin: No rashes, lesions or ulcers Psychiatry: Judgement and insight appear poor.  Mood & affect flat.   Data Reviewed: I have personally reviewed following labs and imaging studies  CBC: Recent Labs  Lab 07/26/19 1900 07/27/19 0600  WBC 13.5* 16.5*  NEUTROABS 11.1*  --   HGB 15.4* 14.5  HCT 49.2* 45.2  MCV 89.8 86.6  PLT 231 825    Basic Metabolic Panel: Recent Labs  Lab 07/26/19 1900 07/27/19 0600  NA 139 137  K 3.5 3.3*  CL 102 102  CO2 25 26  GLUCOSE 145* 155*  BUN 14 12  CREATININE 0.90 0.88  CALCIUM 9.4 9.6   GFR: CrCl cannot be calculated (Unknown ideal weight.). Liver Function Tests: Recent Labs  Lab 07/26/19 1900  AST 19  ALT 14  ALKPHOS 69  BILITOT 1.0  PROT 7.0  ALBUMIN 3.9   No results for input(s): LIPASE, AMYLASE in the last 168 hours. No results for input(s): AMMONIA in the last 168 hours. Coagulation Profile: No results for input(s): INR, PROTIME in the last 168 hours. Cardiac Enzymes: No results for input(s): CKTOTAL, CKMB, CKMBINDEX, TROPONINI in the last 168 hours. BNP (last 3 results) No results for input(s): PROBNP in the last 8760 hours. HbA1C: No results for input(s): HGBA1C in the last 72 hours. CBG: No results for input(s): GLUCAP in the last 168 hours. Lipid Profile: No results for input(s): CHOL, HDL, LDLCALC, TRIG, CHOLHDL, LDLDIRECT  in the last 72 hours. Thyroid Function Tests: No results for input(s): TSH, T4TOTAL, FREET4, T3FREE, THYROIDAB in the last 72 hours. Anemia Panel: No results for input(s): VITAMINB12, FOLATE, FERRITIN, TIBC, IRON, RETICCTPCT in the last 72 hours. Sepsis Labs: No results for input(s): PROCALCITON, LATICACIDVEN in the last 168 hours.  Recent Results (from the past 240 hour(s))  SARS Coronavirus 2 by RT PCR (hospital order, performed in [facility] hospital lab) Nasopharyngeal Nasopharyngeal Swab     Status: None   Collection Time: 07/26/19  7:00 PM   Specimen: Nasopharyngeal Swab  Result Value Ref Range Status   SARS Coronavirus 2 NEGATIVE NEGATIVE Final    Comment: (NOTE) SARS-CoV-2 target nucleic acids are NOT DETECTED. The SARS-CoV-2 RNA is generally detectable in upper and lower respiratory specimens during the acute phase of infection. The lowest concentration of SARS-CoV-2 viral copies this assay can detect is 250 copies  / mL. A negative result does not preclude SARS-CoV-2 infection and should not be used as the sole basis for treatment or other patient management decisions.  A negative result may occur with improper specimen collection / handling, submission of specimen other than nasopharyngeal swab, presence of viral mutation(s) within the areas targeted by this assay, and inadequate number of viral copies (<250 copies / mL). A negative result must be combined with clinical observations, patient history, and epidemiological information. Fact Sheet for Patients:   StrictlyIdeas.no Fact Sheet for Healthcare Providers: BankingDealers.co.za This test is not yet approved or cleared  by the [place] FDA and has been authorized for detection and/or diagnosis of SARS-CoV-2 by FDA under an Emergency Use Authorization (EUA).  This EUA will remain in effect (meaning this test can be used) for the duration of the COVID-19 declaration under Section 564(b)(1) of the Act, 21 U.S.C. section 360bbb-3(b)(1), unless the authorization is terminated or revoked sooner. Performed at [facility], [place] [address], [place], [place] [address]   Surgical pcr screen     Status: None   Collection Time: 07/27/19 12:28 AM   Specimen: Nasal Mucosa; Nasal Swab  Result Value Ref Range Status   MRSA, PCR NEGATIVE NEGATIVE Final   Staphylococcus aureus NEGATIVE NEGATIVE Final    Comment: (NOTE) The Xpert SA Assay (FDA approved for NASAL specimens in patients 36 years of age and older), is one component of a comprehensive surveillance program. It is not intended to diagnose infection nor to guide or monitor treatment. Performed at [facility], [place] [address], [place], [place] [address]       Radiology Studies: DG Thoracic Spine 2 View  Result Date: 07/26/2019 CLINICAL DATA:  Un witnessed fall, found down EXAM: THORACIC SPINE 2 VIEWS COMPARISON:   07/21/2019 FINDINGS: Frontal and cross-table lateral views of the thoracic spine are obtained. Lateral views are suboptimal due to positioning and overlying structures. Stable right convex curvature centered in the lower thoracic spine. Otherwise alignment appears anatomic. There is extensive multilevel thoracic spondylosis unchanged. No evidence of acute fracture. IMPRESSION: 1. No gross displaced fracture. Evaluation limited by technique and positioning. Electronically Signed   By: Randa Ngo M.D.   On: 07/26/2019 19:12   DG Wrist Complete Left  Result Date: 07/26/2019 CLINICAL DATA:  Un witnessed fall, left hand and wrist bruising and skin tears EXAM: LEFT HAND - COMPLETE 3+ VIEW; LEFT WRIST - COMPLETE 3+ VIEW COMPARISON:  None. FINDINGS: Left wrist: Frontal, oblique, and lateral views of the left wrist are obtained as well as an ulnar  deviated view. There is diffuse osteoarthritis throughout the left wrist. No fracture, subluxation, or dislocation. Soft tissues are unremarkable. Left hand: Frontal, oblique, lateral views of the left hand are obtained. Bones are diffusely osteopenic. No acute displaced fracture, subluxation, or dislocation. There is diffuse interphalangeal joint space narrowing, most pronounced at the first and second digits. Soft tissues are unremarkable. IMPRESSION: 1. 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 2. No acute displaced fracture. 3. Osteopenia. Electronically Signed   By: Randa Ngo M.D.   On: 07/26/2019 21:11   CT Head Wo Contrast  Result Date: 07/26/2019 CLINICAL DATA:  Posttraumatic headache after fall. EXAM: CT HEAD WITHOUT CONTRAST CT CERVICAL SPINE WITHOUT CONTRAST TECHNIQUE: Multidetector CT imaging of the head and cervical spine was performed following the standard protocol without intravenous contrast. Multiplanar CT image reconstructions of the cervical spine were also generated. COMPARISON:  Jul 21, 2019. FINDINGS: CT HEAD FINDINGS Brain: Mild  diffuse cortical atrophy is noted. Mild chronic ischemic white matter disease is noted. No mass effect or midline shift is noted. Ventricular size is within normal limits. There is no evidence of mass lesion, hemorrhage or acute infarction. Vascular: No hyperdense vessel or unexpected calcification. Skull: Normal. Negative for fracture or focal lesion. Sinuses/Orbits: No acute finding. Other: Small left frontal scalp hematoma is noted. CT CERVICAL SPINE FINDINGS Alignment: Mild grade 1 anterolisthesis of C4-5 is noted secondary to posterior facet joint hypertrophy. Skull base and vertebrae: No acute fracture. No primary bone lesion or focal pathologic process. Soft tissues and spinal canal: No prevertebral fluid or swelling. No visible canal hematoma. Disc levels: Severe degenerative disc disease is noted at C5-6, C6-7 and C7-T1 with anterior and posterior osteophyte formation. Upper chest: Negative. Other: Degenerative changes are seen involving posterior facet joints bilaterally. IMPRESSION: 1. Mild diffuse cortical atrophy. Mild chronic ischemic white matter disease. Small left frontal scalp hematoma. No acute intracranial abnormality seen. 2. Severe multilevel degenerative disc disease. No acute abnormality seen in the cervical spine. Electronically Signed   By: Marijo Conception M.D.   On: 07/26/2019 18:33   CT Cervical Spine Wo Contrast  Result Date: 07/26/2019 CLINICAL DATA:  Posttraumatic headache after fall. EXAM: CT HEAD WITHOUT CONTRAST CT CERVICAL SPINE WITHOUT CONTRAST TECHNIQUE: Multidetector CT imaging of the head and cervical spine was performed following the standard protocol without intravenous contrast. Multiplanar CT image reconstructions of the cervical spine were also generated. COMPARISON:  Jul 21, 2019. FINDINGS: CT HEAD FINDINGS Brain: Mild diffuse cortical atrophy is noted. Mild chronic ischemic white matter disease is noted. No mass effect or midline shift is noted. Ventricular size is  within normal limits. There is no evidence of mass lesion, hemorrhage or acute infarction. Vascular: No hyperdense vessel or unexpected calcification. Skull: Normal. Negative for fracture or focal lesion. Sinuses/Orbits: No acute finding. Other: Small left frontal scalp hematoma is noted. CT CERVICAL SPINE FINDINGS Alignment: Mild grade 1 anterolisthesis of C4-5 is noted secondary to posterior facet joint hypertrophy. Skull base and vertebrae: No acute fracture. No primary bone lesion or focal pathologic process. Soft tissues and spinal canal: No prevertebral fluid or swelling. No visible canal hematoma. Disc levels: Severe degenerative disc disease is noted at C5-6, C6-7 and C7-T1 with anterior and posterior osteophyte formation. Upper chest: Negative. Other: Degenerative changes are seen involving posterior facet joints bilaterally. IMPRESSION: 1. Mild diffuse cortical atrophy. Mild chronic ischemic white matter disease. Small left frontal scalp hematoma. No acute intracranial abnormality seen. 2. Severe multilevel degenerative disc disease.  No acute abnormality seen in the cervical spine. Electronically Signed   By: Marijo Conception M.D.   On: 07/26/2019 18:33   DG Chest Portable 1 View  Result Date: 07/26/2019 CLINICAL DATA:  Hip fracture.  Screening.  Unwitnessed cough. EXAM: PORTABLE CHEST 1 VIEW COMPARISON:  Radiograph 03/24/2019, CT 03/26/2019 FINDINGS: Single lead left-sided pacemaker. Median sternotomy and CABG. Mild cardiomegaly is chronic and unchanged. Aortic atherosclerosis. Mild chronic peribronchial thickening. No focal airspace disease. No pleural fluid or pneumothorax. No pulmonary edema. The bones are under mineralized. No acute osseous abnormalities are seen. Right lower rib fractures have surrounding callus formation and appear at least subacute. IMPRESSION: 1. No acute chest findings. 2. Post CABG with mild chronic cardiomegaly. 3. Right lower rib fractures have surrounding callus formation  and appear at least subacute. Electronically Signed   By: Keith Rake M.D.   On: 07/26/2019 19:14   DG Hand Complete Left  Result Date: 07/26/2019 CLINICAL DATA:  Un witnessed fall, left hand and wrist bruising and skin tears EXAM: LEFT HAND - COMPLETE 3+ VIEW; LEFT WRIST - COMPLETE 3+ VIEW COMPARISON:  None. FINDINGS: Left wrist: Frontal, oblique, and lateral views of the left wrist are obtained as well as an ulnar deviated view. There is diffuse osteoarthritis throughout the left wrist. No fracture, subluxation, or dislocation. Soft tissues are unremarkable. Left hand: Frontal, oblique, lateral views of the left hand are obtained. Bones are diffusely osteopenic. No acute displaced fracture, subluxation, or dislocation. There is diffuse interphalangeal joint space narrowing, most pronounced at the first and second digits. Soft tissues are unremarkable. IMPRESSION: 1. 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 2. No acute displaced fracture. 3. Osteopenia. Electronically Signed   By: Randa Ngo M.D.   On: 07/26/2019 21:11   ECHOCARDIOGRAM COMPLETE  Result Date: 07/27/2019    ECHOCARDIOGRAM REPORT   Patient Name:   LEONOR DARNELL Date of Exam: 07/27/2019 Medical Rec #:  [id number]      Height:       63.0 in Accession #:    [id number]     Weight:       124.0 lb Date of Birth:  [birth year]       BSA:          1.578 m Patient Age:    34 years       BP:           150/64 mmHg Patient Gender: F              HR:           71 bpm. Exam Location:  Inpatient Procedure: 2D Echo, Cardiac Doppler and Color Doppler STAT ECHO Indications:    CHF-Acute Diastolic 621.30 / Q65.78  History:        Patient has prior history of Echocardiogram examinations, most                 recent 04/12/2014. CHF, CAD, Pacemaker, Arrythmias:Atrial                 Fibrillation; Risk Factors:Hypertension and Former Smoker. DVT.                 GERD.  Sonographer:    Vickie Epley RDCS Referring Phys: [id number] [place]   1. Left ventricular ejection fraction, by estimation, is 50 to 55%. The left ventricle has low normal function. The left ventricle has no regional wall motion abnormalities. Left ventricular diastolic parameters are consistent with  Grade II diastolic dysfunction (pseudonormalization). Elevated left ventricular end-diastolic pressure.  2. Right ventricular systolic function is mildly reduced. The right ventricular size is normal. There is mildly elevated pulmonary artery systolic pressure. The estimated right ventricular systolic pressure is 04.8 mmHg.  3. The mitral valve is abnormal. Mild mitral valve regurgitation.  4. The aortic valve is tricuspid. Aortic valve regurgitation is not visualized. Mild aortic valve sclerosis is present, with no evidence of aortic valve stenosis.  5. The inferior vena cava is normal in size with greater than 50% respiratory variability, suggesting right atrial pressure of 3 mmHg. FINDINGS  Left Ventricle: Left ventricular ejection fraction, by estimation, is 50 to 55%. The left ventricle has low normal function. The left ventricle has no regional wall motion abnormalities. The left ventricular internal cavity size was normal in size. There is no left ventricular hypertrophy. Abnormal (paradoxical) septal motion, consistent with RV pacemaker. Left ventricular diastolic parameters are consistent with Grade II diastolic dysfunction (pseudonormalization). Elevated left ventricular end-diastolic pressure. Right Ventricle: The right ventricular size is normal. No increase in right ventricular wall thickness. Right ventricular systolic function is mildly reduced. There is mildly elevated pulmonary artery systolic pressure. The tricuspid regurgitant velocity  is 3.13 m/s, and with an assumed right atrial pressure of 3 mmHg, the estimated right ventricular systolic pressure is 88.9 mmHg. Left Atrium: Left atrial size was normal in size. Right Atrium: Right atrial size was normal in size.  Pericardium: There is no evidence of pericardial effusion. Mitral Valve: The mitral valve is abnormal. There is mild thickening of the mitral valve leaflet(s). Mild mitral valve regurgitation. Tricuspid Valve: The tricuspid valve is grossly normal. Tricuspid valve regurgitation is mild. Aortic Valve: The aortic valve is tricuspid. Aortic valve regurgitation is not visualized. Mild aortic valve sclerosis is present, with no evidence of aortic valve stenosis. Pulmonic Valve: The pulmonic valve was grossly normal. Pulmonic valve regurgitation is trivial. Aorta: The aortic root and ascending aorta are structurally normal, with no evidence of dilitation. Venous: The inferior vena cava is normal in size with greater than 50% respiratory variability, suggesting right atrial pressure of 3 mmHg. IAS/Shunts: No atrial level shunt detected by color flow Doppler. Additional Comments: A pacer wire is visualized.  LEFT VENTRICLE PLAX 2D LVIDd:         4.00 cm LVIDs:         3.10 cm LV PW:         0.80 cm LV IVS:        0.80 cm LVOT diam:     1.70 cm LV SV:         30 LV SV Index:   19 LVOT Area:     2.27 cm  LV Volumes (MOD) LV vol d, MOD A2C: 48.9 ml LV vol d, MOD A4C: 43.9 ml LV vol s, MOD A2C: 22.9 ml LV vol s, MOD A4C: 21.0 ml LV SV MOD A2C:     26.0 ml LV SV MOD A4C:     43.9 ml LV SV MOD BP:      24.4 ml RIGHT VENTRICLE RV S prime:     6.97 cm/s TAPSE (M-mode): 0.9 cm LEFT ATRIUM             Index       RIGHT ATRIUM           Index LA diam:        4.40 cm 2.79 cm/m  RA Area:     13.90 cm  LA Vol (A2C):   27.0 ml 17.11 ml/m RA Volume:   38.10 ml  24.14 ml/m LA Vol (A4C):   25.5 ml 16.16 ml/m LA Biplane Vol: 26.2 ml 16.60 ml/m  AORTIC VALVE LVOT Vmax:   59.50 cm/s LVOT Vmean:  42.200 cm/s LVOT VTI:    0.133 m  AORTA Ao Root diam: 2.90 cm MR Peak grad: 133.6 mmHg  TRICUSPID VALVE MR Vmax:      578.00 cm/s TR Peak grad:   39.2 mmHg                           TR Vmax:        313.00 cm/s                            SHUNTS                            Systemic VTI:  0.13 m                           Systemic Diam: 1.70 cm Lyman Bishop MD Electronically signed by Lyman Bishop MD Signature Date/Time: 07/27/2019/9:19:07 AM    Final    DG Hip Unilat W or Wo Pelvis 2-3 Views Left  Result Date: 07/26/2019 CLINICAL DATA:  Left-sided hip pain after fall EXAM: DG HIP (WITH OR WITHOUT PELVIS) 2-3V LEFT COMPARISON:  07/21/2019 FINDINGS: Acute mildly comminuted left intertrochanteric fracture without angulation. Femoral head is normally aligned. Pubic symphysis and rami are intact. IMPRESSION: Acute left intertrochanteric fracture Electronically Signed   By: Donavan Foil M.D.   On: 07/26/2019 18:13    Scheduled Meds:  chlorhexidine  15 mL Mouth/Throat Once   [MAR Hold] feeding supplement (ENSURE ENLIVE)  237 mL Oral BID BM   feeding supplement  296 mL Oral Once   [MAR Hold] memantine  10 mg Oral Daily   [MAR Hold] metoprolol tartrate  12.5 mg Oral BID   [MAR Hold] multivitamin with minerals  1 tablet Oral Daily   povidone-iodine  2 application Topical Once   tranexamic acid (CYKLOKAPRON) topical - INTRAOP  2,000 mg Topical To OR   Continuous Infusions:   ceFAZolin (ANCEF) IV     lactated ringers     [MAR Hold] methocarbamol (ROBAXIN) IV     [MAR Hold] potassium chloride 10 mEq (07/27/19 1109)   tranexamic acid       LOS: 1 day   Time spent: 32 minutes   Darliss Cheney, MD [facility]  07/27/2019, 11:57 AM   To contact the attending provider between 7A-7P or the covering provider during after hours 7P-7A, please log into the web site [email or web address].

## 2019-07-27 NOTE — Discharge Instructions (Signed)
° ° °  1. Change dressings as needed °2. May shower but keep incisions covered and dry °3. Take lovenox to prevent blood clots °4. Take stool softeners as needed °5. Take pain meds as needed ° °

## 2019-07-27 NOTE — Plan of Care (Signed)

## 2019-07-28 DIAGNOSIS — S72142A Displaced intertrochanteric fracture of left femur, initial encounter for closed fracture: Principal | ICD-10-CM

## 2019-07-28 LAB — CBC WITH DIFFERENTIAL/PLATELET
Abs Immature Granulocytes: 0.11 10*3/uL — ABNORMAL HIGH (ref 0.00–0.07)
Basophils Absolute: 0 10*3/uL (ref 0.0–0.1)
Basophils Relative: 0 %
Eosinophils Absolute: 0 10*3/uL (ref 0.0–0.5)
Eosinophils Relative: 0 %
HCT: 38.1 % (ref 36.0–46.0)
Hemoglobin: 12.1 g/dL (ref 12.0–15.0)
Immature Granulocytes: 1 %
Lymphocytes Relative: 4 %
Lymphs Abs: 0.8 10*3/uL (ref 0.7–4.0)
MCH: 27.9 pg (ref 26.0–34.0)
MCHC: 31.8 g/dL (ref 30.0–36.0)
MCV: 87.8 fL (ref 80.0–100.0)
Monocytes Absolute: 1 10*3/uL (ref 0.1–1.0)
Monocytes Relative: 4 %
Neutro Abs: 20.3 10*3/uL — ABNORMAL HIGH (ref 1.7–7.7)
Neutrophils Relative %: 91 %
Platelets: 200 10*3/uL (ref 150–400)
RBC: 4.34 MIL/uL (ref 3.87–5.11)
RDW: 14.7 % (ref 11.5–15.5)
WBC: 22.2 10*3/uL — ABNORMAL HIGH (ref 4.0–10.5)
nRBC: 0 % (ref 0.0–0.2)

## 2019-07-28 LAB — BASIC METABOLIC PANEL
Anion gap: 12 (ref 5–15)
BUN: 14 mg/dL (ref 8–23)
CO2: 24 mmol/L (ref 22–32)
Calcium: 9.3 mg/dL (ref 8.9–10.3)
Chloride: 99 mmol/L (ref 98–111)
Creatinine, Ser: 1.09 mg/dL — ABNORMAL HIGH (ref 0.44–1.00)
GFR calc Af Amer: 51 mL/min — ABNORMAL LOW (ref >60)
GFR calc non Af Amer: 44 mL/min — ABNORMAL LOW (ref >60)
Glucose, Bld: 271 mg/dL — ABNORMAL HIGH (ref 70–99)
Potassium: 3.8 mmol/L (ref 3.5–5.1)
Sodium: 135 mmol/L (ref 135–145)

## 2019-07-28 NOTE — Evaluation (Signed)
Clinical/Bedside Swallow Evaluation Patient Details  Name: Cheryl Zamora MRN: [id number] Date of Birth: 07-24-1923  Today's Date: 07/28/2019 Time: SLP Start Time (ACUTE ONLY): 1330 SLP Stop Time (ACUTE ONLY): 1349 SLP Time Calculation (min) (ACUTE ONLY): 19 min  Past Medical History:  Past Medical History:  Diagnosis Date  . Atrial fibrillation with rapid ventricular response ([place]) 11/2012; 10/2013   a. s/p TEE/DCCV 11/2012 & 10/2013 b. amiodarone caused significant QT prlongation (>685msec)  . Chronic combined systolic and diastolic CHF (congestive heart failure) ([place])   . Coronary artery disease 2005   a. s/p CABG (2012)  . GERD (gastroesophageal reflux disease)   . Hyperlipidemia    pt denies this hx on 01/03/2015  . Hypertension   . Neuropathy   . Osteoarthritis of back    Past Surgical History:  Past Surgical History:  Procedure Laterality Date  . ABDOMINAL HYSTERECTOMY    . AV NODE ABLATION N/A 11/16/2016   Procedure: AV Node Ablation;  Surgeon: Constance Haw, MD;  Location: [facility];  Service: Cardiovascular;  Laterality: N/A;  . BACK SURGERY    . CARDIAC CATHETERIZATION  04/12/2003   [place] 60, LAD 90, D1 30, CFX 95->T, RCA OK  . CARDIOVERSION N/A 12/13/2012   Procedure: CARDIOVERSION;  Surgeon: Larey Dresser, MD;  Location: [facility];  Service: Cardiovascular;  Laterality: N/A;  . CARDIOVERSION N/A 11/13/2013   Procedure: CARDIOVERSION;  Surgeon: Lorali Spark, MD;  Location: [facility] ENDOSCOPY;  Service: Cardiovascular;  Laterality: N/A;  . CARDIOVERSION N/A 04/12/2014   Procedure: CARDIOVERSION;  Surgeon: Thayer Headings, MD;  Location: [facility] ENDOSCOPY;  Service: Cardiovascular;  Laterality: N/A;  . CARDIOVERSION N/A 01/06/2015   Procedure: CARDIOVERSION;  Surgeon: Larey Dresser, MD;  Location: [place];  Service: Cardiovascular;  Laterality: N/A;  . CARDIOVERSION N/A 11/11/2016   Procedure: CARDIOVERSION;  Surgeon: Josue Hector, MD;  Location: [facility]  ENDOSCOPY;  Service: Cardiovascular;  Laterality: N/A;  . [place]  1960's   "ruptured disc'  . CORONARY ANGIOPLASTY    . CORONARY ARTERY BYPASS GRAFT  04/18/2003   x 4; LIMA-LAD, SVG-D1, SVG-OM1-OM2  . PACEMAKER IMPLANT N/A 11/16/2016   Procedure: Pacemaker Implant;  Surgeon: Constance Haw, MD;  Location: [facility];  Service: Cardiovascular;  Laterality: N/A;  . TEE WITHOUT CARDIOVERSION N/A 12/13/2012   Procedure: TRANSESOPHAGEAL ECHOCARDIOGRAM (TEE);  Surgeon: Larey Dresser, MD;  Location: [facility] ENDOSCOPY;  Service: Cardiovascular;  Laterality: N/A;  . TEE WITHOUT CARDIOVERSION N/A 11/13/2013   Procedure: TRANSESOPHAGEAL ECHOCARDIOGRAM (TEE);  Surgeon: Taffie Spark, MD;  Location: [place];  Service: Cardiovascular;  Laterality: N/A;  . TEE WITHOUT CARDIOVERSION N/A 11/11/2016   Procedure: TRANSESOPHAGEAL ECHOCARDIOGRAM (TEE);  Surgeon: Josue Hector, MD;  Location: [facility] ENDOSCOPY;  Service: Cardiovascular;  Laterality: N/A;  . TONSILLECTOMY     HPI:  86 y.o. female with medical history significant of dementia resident at [facility]; Originally seen on 22 May for unwitnessed fall out of bed at that time no fracture was found and she was discharged back to nursing home facility.  On 07/26/19,  she was found again on the floor on her left side; no recollection of fall;  07/26/19 CXR indicated No acute chest findings. 2. Post CABG with mild chronic cardiomegaly. 3. Right lower rib fractures have surrounding callus formation and appear at least subacute  Assessment / Plan / Recommendation Clinical Impression  Pt with a normal oropharyngeal swallow noted during intake  of various consistencies including thin, puree and solids; no oral holding or decreased propulsion despite level of dementia.  Recommend continue current diet of regular/thin liquids; ST will s/o at this time.  SLP Visit Diagnosis: Dysphagia, unspecified (R13.10)    Aspiration Risk   Mild aspiration risk    Diet Recommendation   Regular/thin liquids  Medication Administration: Whole meds with liquid    Other  Recommendations Oral Care Recommendations: Oral care BID   Follow up Recommendations None      Frequency and Duration   Evaluation only         Prognosis Prognosis for Safe Diet Advancement: Good      Swallow Study   General Date of Onset: 07/26/19 HPI: 86 y.o. female with medical history significant of dementia Type of Study: Bedside Swallow Evaluation Previous Swallow Assessment: (n/a) Diet Prior to this Study: Regular;Thin liquids Temperature Spikes Noted: No Respiratory Status: Room air History of Recent Intubation: Yes(only for surgery and then extubated) Length of Intubations (days): (<1) Date extubated: 07/27/19 Behavior/Cognition: Alert;Cooperative;Confused;Requires cueing Oral Cavity Assessment: Within Functional Limits Oral Care Completed by SLP: Recent completion by staff Oral Cavity - Dentition: Adequate natural dentition Vision: Functional for self-feeding Self-Feeding Abilities: Able to feed self;Needs assist Patient Positioning: Upright in bed Baseline Vocal Quality: Normal Volitional Cough: Cognitively unable to elicit Volitional Swallow: Unable to elicit    Oral/Motor/Sensory Function Overall Oral Motor/Sensory Function: Within functional limits   Ice Chips Ice chips: Not tested   Thin Liquid Thin Liquid: Within functional limits Presentation: Cup;Self Fed    Nectar Thick Nectar Thick Liquid: Not tested   Honey Thick Honey Thick Liquid: Not tested   Puree Puree: Within functional limits Presentation: Self Fed   Solid     Solid: Within functional limits Presentation: Self Fed      Elvina Sidle, M.S., CCC-SLP 07/28/2019,2:15 PM

## 2019-07-28 NOTE — Progress Notes (Signed)
Patient stable Pain controlled Dressings okay Ready for transfer to skilled nursing once bed becomes available

## 2019-07-28 NOTE — Anesthesia Postprocedure Evaluation (Signed)
Anesthesia Post Note  Patient: Cheryl Zamora  Procedure(s) Performed: INTRAMEDULLARY (IM) NAIL INTERTROCHANTRIC (Left Hip)     Patient location during evaluation: PACU Anesthesia Type: General Level of consciousness: awake Pain management: pain level controlled Vital Signs Assessment: post-procedure vital signs reviewed and stable Respiratory status: spontaneous breathing, nonlabored ventilation, respiratory function stable and patient connected to nasal cannula oxygen Cardiovascular status: blood pressure returned to baseline and stable Postop Assessment: no apparent nausea or vomiting Anesthetic complications: no    Last Vitals:  Vitals:   07/27/19 2106 07/28/19 0336  BP: (!) 140/58 (!) 134/52  Pulse: 69 72  Resp:  14  Temp:  (!) 36.4 C  SpO2:  99%    Last Pain:  Vitals:   07/28/19 0336  TempSrc: Oral  PainSc:                  Venisha Boehning P Sabirin Baray

## 2019-07-28 NOTE — Plan of Care (Signed)
  Problem: Education: Goal: Knowledge of General Education information will improve Description: Including pain rating scale, medication(s)/side effects and non-pharmacologic comfort measures Outcome: Progressing   Problem: Pain Managment: Goal: General experience of comfort will improve Outcome: Progressing   Problem: Safety: Goal: Ability to remain free from injury will improve Outcome: Progressing   

## 2019-07-28 NOTE — Progress Notes (Signed)
PROGRESS NOTE    Cheryl Zamora  UXL:[id number] DOB: March 29, 1923 DOA: 07/26/2019 PCP: Welford Roche, NP   Brief Narrative:  86 year old female with past medical history of dementia, A. fib not on any anticoagulation, history of CAD status post CABG, hypertension, CKD stage IIIa, sick sinus syndrome with pacemaker, chronic diastolic CHF presented to ED with unwitnessed fall and subsequently was diagnosed with acute left intertrochanteric fracture.  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  Chest x-ray showed right lower rib fracture which appears to be subacute.  She was admitted to hospital service and orthopedics were consulted.  Assessment & Plan:   Principal Problem:   Displaced intertrochanteric fracture of left femur, initial encounter for closed fracture (HCC) Active Problems:   PAF (paroxysmal atrial fibrillation) (HCC)   Chronic diastolic CHF (congestive heart failure), NYHA class 3 (HCC)   Essential hypertension   CKD (chronic kidney disease) stage 3, GFR 30-59 ml/min   CAD, multiple vessel, hx CABG 2012   Paroxysmal atrial fibrillation (HCC)   Chronic diastolic (congestive) heart failure (HCC)   Nodule of middle lobe of right lung   Leukocytosis   Closed left hip fracture ([place]) -orthopedics on board.  They consulted cardiology for cardiac clearance and patient was cleared.  [name] status post ORIF with intramedullary implant.  Management per orthopedics.   Essential hypertension: Fairly controlled.  Continue metoprolol and as needed hydralazine.   CAD, multiple vessel, hx CABG 2012 -chronic stable patient not reporting any chest pain or shortness of breath.  Seen and cleared by cardiology.    PAF (paroxysmal atrial fibrillation) (HCC)-not on anticoagulation given risk of falls as  well as hematuria.  Continue metoprolol blood pressure well.  Currently in sinus rhythm.    Chronic diastolic CHF (congestive heart failure), NYHA class 3 (HCC) -currently appears to be euvolemic avoid over aggressive fluids Appreciate cardiology input regarding perioperative cardiac monitoring and management    CKD (chronic kidney disease) stage 3a: stable continue to monitor   Leukocytosis -it is chronic.  No signs of infection.  Monitor.  Hypokalemia: Resolved.  Right lower lobe fractures possibly subacute patient with multiple falls.  She has no symptoms or any pain.  Would benefit from incentive spirometer monitor for any evidence of pneumonia postoperatively supportive management  Dementia: Continue Namenda.  DVT prophylaxis: Lovenox    Code Status: DNR  Family Communication:  None present at bedside.  [name] from: SNF Disposition Plan: SNF Barriers to discharge: Placement and PT OT assessment.  Status is: Inpatient  Remains inpatient appropriate because:Inpatient level of care appropriate due to severity of illness   Dispo: The patient is from: SNF              Anticipated d/c is to: SNF              Anticipated d/c date is:1- 2 days              Patient currently is medically stable to DC         Estimated body mass index is 22.14 kg/m as calculated from the following:   Height as of this encounter: 5\' 3"  (1.6 m).   Weight as of this encounter: 56.7 kg.  Pressure Ulcer 01/13/15 Stage I -  Intact skin with non-blanchable redness of a localized area usually  over a bony prominence. (Active)  01/13/15 0839  Location: Buttocks  Location Orientation: Right;Left  Staging: Stage I -  Intact skin with non-blanchable redness of a localized area usually over a bony prominence.  Wound Description (Comments):   Present on Admission: Yes     Nutritional status:  Nutrition Problem: Increased nutrient needs Etiology: hip fracture   Signs/Symptoms:  estimated needs   Interventions: Refer to RD note for recommendations    Consultants:   Orthopedics  Cardiology  Procedures:   Left hip ORIF with intramedullary implant 07/27/2019  Antimicrobials:  Anti-infectives (From admission, onward)   Start     Dose/Rate Route Frequency Ordered Stop   07/27/19 1900  ceFAZolin (ANCEF) IVPB 2g/100 mL premix     2 g 200 mL/hr over 30 Minutes Intravenous Every 6 hours 07/27/19 1543 07/28/19 0641   07/27/19 1315  ceFAZolin (ANCEF) IVPB 2g/100 mL premix     2 g 200 mL/hr over 30 Minutes Intravenous To [facility] 07/26/19 2334 07/27/19 1310         Subjective: Patient seen and examined.  She is sitting in the bed eating breakfast.  She has no complaints.  She is alert and oriented to person only which is her baseline due to her dementia.  Objective: Vitals:   07/27/19 1925 07/27/19 2106 07/28/19 0336 07/28/19 0821  BP: (!) 136/51 (!) 140/58 (!) 134/52 (!) 133/49  Pulse: 69 69 72 69  Resp: 16  14 20   Temp: 97.8 F (36.6 C)  (!) 97.5 F (36.4 C) 98 F (36.7 C)  TempSrc: Oral  Oral   SpO2: 96%  99% 99%  Weight:      Height:        Intake/Output Summary (Last 24 hours) at 07/28/2019 1200 Last data filed at 07/28/2019 0740 Gross per 24 hour  Intake 1750.44 ml  Output 1725 ml  Net 25.44 ml   Filed Weights   07/27/19 1237  Weight: 56.7 kg    Examination:  General exam: Appears calm and comfortable  Respiratory system: Clear to auscultation. Respiratory effort normal. Cardiovascular system: S1 & S2 heard, RRR. No JVD, murmurs, rubs, gallops or clicks. No pedal edema. Gastrointestinal system: Abdomen is nondistended, soft and nontender. No organomegaly or masses felt. Normal bowel sounds heard. Central nervous system: Alert and oriented. No focal neurological deficits. Skin: No rashes, lesions or ulcers.  Psychiatry: Judgement and insight appear poor. Mood & affect flat   Data Reviewed: I have personally reviewed  following labs and imaging studies  CBC: Recent Labs  Lab 07/26/19 1900 07/27/19 0600 07/28/19 0947  WBC 13.5* 16.5* 22.2*  NEUTROABS 11.1*  --  20.3*  HGB 15.4* 14.5 12.1  HCT 49.2* 45.2 38.1  MCV 89.8 86.6 87.8  PLT 231 237 419   Basic Metabolic Panel: Recent Labs  Lab 07/26/19 1900 07/27/19 0600 07/28/19 0947  NA 139 137 135  K 3.5 3.3* 3.8  CL 102 102 99  CO2 25 26 24   GLUCOSE 145* 155* 271*  BUN 14 12 14   CREATININE 0.90 0.88 1.09*  CALCIUM 9.4 9.6 9.3   GFR: Estimated Creatinine Clearance: 26.7 mL/min (A) (by C-G formula based on SCr of 1.09 mg/dL (H)). Liver Function Tests: Recent Labs  Lab 07/26/19 1900  AST 19  ALT 14  ALKPHOS 69  BILITOT 1.0  PROT 7.0  ALBUMIN 3.9   No results for input(s): LIPASE, AMYLASE in the last 168 hours. No results for input(s): AMMONIA in the last 168  hours. Coagulation Profile: No results for input(s): INR, PROTIME in the last 168 hours. Cardiac Enzymes: No results for input(s): CKTOTAL, CKMB, CKMBINDEX, TROPONINI in the last 168 hours. BNP (last 3 results) No results for input(s): PROBNP in the last 8760 hours. HbA1C: No results for input(s): HGBA1C in the last 72 hours. CBG: No results for input(s): GLUCAP in the last 168 hours. Lipid Profile: No results for input(s): CHOL, HDL, LDLCALC, TRIG, CHOLHDL, LDLDIRECT in the last 72 hours. Thyroid Function Tests: No results for input(s): TSH, T4TOTAL, FREET4, T3FREE, THYROIDAB in the last 72 hours. Anemia Panel: No results for input(s): VITAMINB12, FOLATE, FERRITIN, TIBC, IRON, RETICCTPCT in the last 72 hours. Sepsis Labs: No results for input(s): PROCALCITON, LATICACIDVEN in the last 168 hours.  Recent Results (from the past 240 hour(s))  SARS Coronavirus 2 by RT PCR (hospital order, performed in [facility] hospital lab) Nasopharyngeal Nasopharyngeal Swab     Status: None   Collection Time: 07/26/19  7:00 PM   Specimen: Nasopharyngeal Swab  Result Value Ref Range  Status   SARS Coronavirus 2 NEGATIVE NEGATIVE Final    Comment: (NOTE) SARS-CoV-2 target nucleic acids are NOT DETECTED. The SARS-CoV-2 RNA is generally detectable in upper and lower respiratory specimens during the acute phase of infection. The lowest concentration of SARS-CoV-2 viral copies this assay can detect is 250 copies / mL. A negative result does not preclude SARS-CoV-2 infection and should not be used as the sole basis for treatment or other patient management decisions.  A negative result may occur with improper specimen collection / handling, submission of specimen other than nasopharyngeal swab, presence of viral mutation(s) within the areas targeted by this assay, and inadequate number of viral copies (<250 copies / mL). A negative result must be combined with clinical observations, patient history, and epidemiological information. Fact Sheet for Patients:   StrictlyIdeas.no Fact Sheet for Healthcare Providers: BankingDealers.co.za This test is not yet approved or cleared  by the [place] FDA and has been authorized for detection and/or diagnosis of SARS-CoV-2 by FDA under an Emergency Use Authorization (EUA).  This EUA will remain in effect (meaning this test can be used) for the duration of the COVID-19 declaration under Section 564(b)(1) of the Act, 21 U.S.C. section 360bbb-3(b)(1), unless the authorization is terminated or revoked sooner. Performed at [facility], [place] [address], [place], [place] [address]   Surgical pcr screen     Status: None   Collection Time: 07/27/19 12:28 AM   Specimen: Nasal Mucosa; Nasal Swab  Result Value Ref Range Status   MRSA, PCR NEGATIVE NEGATIVE Final   Staphylococcus aureus NEGATIVE NEGATIVE Final    Comment: (NOTE) The Xpert SA Assay (FDA approved for NASAL specimens in patients 33 years of age and older), is one component of a comprehensive surveillance  program. It is not intended to diagnose infection nor to guide or monitor treatment. Performed at [facility], [place] [address] [place], [place] [address]       Radiology Studies: DG Thoracic Spine 2 View  Result Date: 07/26/2019 CLINICAL DATA:  Un witnessed fall, found down EXAM: THORACIC SPINE 2 VIEWS COMPARISON:  07/21/2019 FINDINGS: Frontal and cross-table lateral views of the thoracic spine are obtained. Lateral views are suboptimal due to positioning and overlying structures. Stable right convex curvature centered in the lower thoracic spine. Otherwise alignment appears anatomic. There is extensive multilevel thoracic spondylosis unchanged. No evidence of acute fracture. IMPRESSION: 1. No gross displaced fracture. Evaluation limited  by technique and positioning. Electronically Signed   By: Randa Ngo M.D.   On: 07/26/2019 19:12   DG Wrist Complete Left  Result Date: 07/26/2019 CLINICAL DATA:  Un witnessed fall, left hand and wrist bruising and skin tears EXAM: LEFT HAND - COMPLETE 3+ VIEW; LEFT WRIST - COMPLETE 3+ VIEW COMPARISON:  None. FINDINGS: Left wrist: Frontal, oblique, and lateral views of the left wrist are obtained as well as an ulnar deviated view. There is diffuse osteoarthritis throughout the left wrist. No fracture, subluxation, or dislocation. Soft tissues are unremarkable. Left hand: Frontal, oblique, lateral views of the left hand are obtained. Bones are diffusely osteopenic. No acute displaced fracture, subluxation, or dislocation. There is diffuse interphalangeal joint space narrowing, most pronounced at the first and second digits. Soft tissues are unremarkable. IMPRESSION: 1. 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 2. No acute displaced fracture. 3. Osteopenia. Electronically Signed   By: Randa Ngo M.D.   On: 07/26/2019 21:11   CT Head Wo Contrast  Result Date: 07/26/2019 CLINICAL DATA:  Posttraumatic headache after fall. EXAM: CT HEAD WITHOUT  CONTRAST CT CERVICAL SPINE WITHOUT CONTRAST TECHNIQUE: Multidetector CT imaging of the head and cervical spine was performed following the standard protocol without intravenous contrast. Multiplanar CT image reconstructions of the cervical spine were also generated. COMPARISON:  Jul 21, 2019. FINDINGS: CT HEAD FINDINGS Brain: Mild diffuse cortical atrophy is noted. Mild chronic ischemic white matter disease is noted. No mass effect or midline shift is noted. Ventricular size is within normal limits. There is no evidence of mass lesion, hemorrhage or acute infarction. Vascular: No hyperdense vessel or unexpected calcification. Skull: Normal. Negative for fracture or focal lesion. Sinuses/Orbits: No acute finding. Other: Small left frontal scalp hematoma is noted. CT CERVICAL SPINE FINDINGS Alignment: Mild grade 1 anterolisthesis of C4-5 is noted secondary to posterior facet joint hypertrophy. Skull base and vertebrae: No acute fracture. No primary bone lesion or focal pathologic process. Soft tissues and spinal canal: No prevertebral fluid or swelling. No visible canal hematoma. Disc levels: Severe degenerative disc disease is noted at C5-6, C6-7 and C7-T1 with anterior and posterior osteophyte formation. Upper chest: Negative. Other: Degenerative changes are seen involving posterior facet joints bilaterally. IMPRESSION: 1. Mild diffuse cortical atrophy. Mild chronic ischemic white matter disease. Small left frontal scalp hematoma. No acute intracranial abnormality seen. 2. Severe multilevel degenerative disc disease. No acute abnormality seen in the cervical spine. Electronically Signed   By: Marijo Conception M.D.   On: 07/26/2019 18:33   CT Cervical Spine Wo Contrast  Result Date: 07/26/2019 CLINICAL DATA:  Posttraumatic headache after fall. EXAM: CT HEAD WITHOUT CONTRAST CT CERVICAL SPINE WITHOUT CONTRAST TECHNIQUE: Multidetector CT imaging of the head and cervical spine was performed following the standard  protocol without intravenous contrast. Multiplanar CT image reconstructions of the cervical spine were also generated. COMPARISON:  Jul 21, 2019. FINDINGS: CT HEAD FINDINGS Brain: Mild diffuse cortical atrophy is noted. Mild chronic ischemic white matter disease is noted. No mass effect or midline shift is noted. Ventricular size is within normal limits. There is no evidence of mass lesion, hemorrhage or acute infarction. Vascular: No hyperdense vessel or unexpected calcification. Skull: Normal. Negative for fracture or focal lesion. Sinuses/Orbits: No acute finding. Other: Small left frontal scalp hematoma is noted. CT CERVICAL SPINE FINDINGS Alignment: Mild grade 1 anterolisthesis of C4-5 is noted secondary to posterior facet joint hypertrophy. Skull base and vertebrae: No acute fracture. No primary bone lesion or  focal pathologic process. Soft tissues and spinal canal: No prevertebral fluid or swelling. No visible canal hematoma. Disc levels: Severe degenerative disc disease is noted at C5-6, C6-7 and C7-T1 with anterior and posterior osteophyte formation. Upper chest: Negative. Other: Degenerative changes are seen involving posterior facet joints bilaterally. IMPRESSION: 1. Mild diffuse cortical atrophy. Mild chronic ischemic white matter disease. Small left frontal scalp hematoma. No acute intracranial abnormality seen. 2. Severe multilevel degenerative disc disease. No acute abnormality seen in the cervical spine. Electronically Signed   By: Marijo Conception M.D.   On: 07/26/2019 18:33   Pelvis Portable  Result Date: 07/27/2019 CLINICAL DATA:  Postop EXAM: PORTABLE PELVIS 1-2 VIEWS COMPARISON:  07/26/2019 FINDINGS: Incompletely visualized intramedullary rod within the left femur, bridging a left intertrochanteric fracture with anatomic alignment on single view. Hardware appears intact. Vascular calcifications. IMPRESSION: Partially visualized intramedullary left femoral rod for intertrochanteric fracture.  Gas in the soft tissues consistent with recent surgery. Electronically Signed   By: Donavan Foil M.D.   On: 07/27/2019 16:00   DG Chest Portable 1 View  Result Date: 07/26/2019 CLINICAL DATA:  Hip fracture.  Screening.  Unwitnessed cough. EXAM: PORTABLE CHEST 1 VIEW COMPARISON:  Radiograph 03/24/2019, CT 03/26/2019 FINDINGS: Single lead left-sided pacemaker. Median sternotomy and CABG. Mild cardiomegaly is chronic and unchanged. Aortic atherosclerosis. Mild chronic peribronchial thickening. No focal airspace disease. No pleural fluid or pneumothorax. No pulmonary edema. The bones are under mineralized. No acute osseous abnormalities are seen. Right lower rib fractures have surrounding callus formation and appear at least subacute. IMPRESSION: 1. No acute chest findings. 2. Post CABG with mild chronic cardiomegaly. 3. Right lower rib fractures have surrounding callus formation and appear at least subacute. Electronically Signed   By: Keith Rake M.D.   On: 07/26/2019 19:14   DG Hand Complete Left  Result Date: 07/26/2019 CLINICAL DATA:  Un witnessed fall, left hand and wrist bruising and skin tears EXAM: LEFT HAND - COMPLETE 3+ VIEW; LEFT WRIST - COMPLETE 3+ VIEW COMPARISON:  None. FINDINGS: Left wrist: Frontal, oblique, and lateral views of the left wrist are obtained as well as an ulnar deviated view. There is diffuse osteoarthritis throughout the left wrist. No fracture, subluxation, or dislocation. Soft tissues are unremarkable. Left hand: Frontal, oblique, lateral views of the left hand are obtained. Bones are diffusely osteopenic. No acute displaced fracture, subluxation, or dislocation. There is diffuse interphalangeal joint space narrowing, most pronounced at the first and second digits. Soft tissues are unremarkable. IMPRESSION: 1. 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 2. No acute displaced fracture. 3. Osteopenia. Electronically Signed   By: Randa Ngo M.D.   On: 07/26/2019  21:11   DG C-Arm 1-60 Min  Result Date: 07/27/2019 CLINICAL DATA:  Left femur IM nail EXAM: DG C-ARM 1-60 MIN; LEFT FEMUR 2 VIEWS CONTRAST:  None FLUOROSCOPY TIME:  Fluoroscopy Time:  1 minutes 29 seconds Number of Acquired Spot Images: 4 COMPARISON:  07/26/2019 FINDINGS: Four low resolution intraoperative spot views of the left femur. The images demonstrate intramedullary rod and distal screw fixation of the left femur for intertrochanteric fracture. There is anatomic alignment. IMPRESSION: Intraoperative fluoroscopic assistance provided during surgical fixation of proximal left femoral fracture Electronically Signed   By: Donavan Foil M.D.   On: 07/27/2019 16:00   ECHOCARDIOGRAM COMPLETE  Result Date: 07/27/2019    ECHOCARDIOGRAM REPORT   Patient Name:   Cheryl Zamora Date of Exam: 07/27/2019 Medical Rec #:  [id number]  Height:       63.0 in Accession #:    [id number]     Weight:       124.0 lb Date of Birth:  1924-02-17       BSA:          1.578 m Patient Age:    77 years       BP:           150/64 mmHg Patient Gender: F              HR:           71 bpm. Exam Location:  Inpatient Procedure: 2D Echo, Cardiac Doppler and Color Doppler STAT ECHO Indications:    CHF-Acute Diastolic 607.37 / T06.26  History:        Patient has prior history of Echocardiogram examinations, most                 recent 04/12/2014. CHF, CAD, Pacemaker, Arrythmias:Atrial                 Fibrillation; Risk Factors:Hypertension and Former Smoker. DVT.                 GERD.  Sonographer:    Vickie Epley RDCS Referring Phys: [id number] [place]  1. Left ventricular ejection fraction, by estimation, is 50 to 55%. The left ventricle has low normal function. The left ventricle has no regional wall motion abnormalities. Left ventricular diastolic parameters are consistent with Grade II diastolic dysfunction (pseudonormalization). Elevated left ventricular end-diastolic pressure.  2. Right ventricular systolic function is  mildly reduced. The right ventricular size is normal. There is mildly elevated pulmonary artery systolic pressure. The estimated right ventricular systolic pressure is 27.0 mmHg.  3. The mitral valve is abnormal. Mild mitral valve regurgitation.  4. The aortic valve is tricuspid. Aortic valve regurgitation is not visualized. Mild aortic valve sclerosis is present, with no evidence of aortic valve stenosis.  5. The inferior vena cava is normal in size with greater than 50% respiratory variability, suggesting right atrial pressure of 3 mmHg. FINDINGS  Left Ventricle: Left ventricular ejection fraction, by estimation, is 50 to 55%. The left ventricle has low normal function. The left ventricle has no regional wall motion abnormalities. The left ventricular internal cavity size was normal in size. There is no left ventricular hypertrophy. Abnormal (paradoxical) septal motion, consistent with RV pacemaker. Left ventricular diastolic parameters are consistent with Grade II diastolic dysfunction (pseudonormalization). Elevated left ventricular end-diastolic pressure. Right Ventricle: The right ventricular size is normal. No increase in right ventricular wall thickness. Right ventricular systolic function is mildly reduced. There is mildly elevated pulmonary artery systolic pressure. The tricuspid regurgitant velocity  is 3.13 m/s, and with an assumed right atrial pressure of 3 mmHg, the estimated right ventricular systolic pressure is 35.0 mmHg. Left Atrium: Left atrial size was normal in size. Right Atrium: Right atrial size was normal in size. Pericardium: There is no evidence of pericardial effusion. Mitral Valve: The mitral valve is abnormal. There is mild thickening of the mitral valve leaflet(s). Mild mitral valve regurgitation. Tricuspid Valve: The tricuspid valve is grossly normal. Tricuspid valve regurgitation is mild. Aortic Valve: The aortic valve is tricuspid. Aortic valve regurgitation is not visualized. Mild  aortic valve sclerosis is present, with no evidence of aortic valve stenosis. Pulmonic Valve: The pulmonic valve was grossly normal. Pulmonic valve regurgitation is trivial. Aorta: The aortic root and ascending aorta are structurally normal, with no  evidence of dilitation. Venous: The inferior vena cava is normal in size with greater than 50% respiratory variability, suggesting right atrial pressure of 3 mmHg. IAS/Shunts: No atrial level shunt detected by color flow Doppler. Additional Comments: A pacer wire is visualized.  LEFT VENTRICLE PLAX 2D LVIDd:         4.00 cm LVIDs:         3.10 cm LV PW:         0.80 cm LV IVS:        0.80 cm LVOT diam:     1.70 cm LV SV:         30 LV SV Index:   19 LVOT Area:     2.27 cm  LV Volumes (MOD) LV vol d, MOD A2C: 48.9 ml LV vol d, MOD A4C: 43.9 ml LV vol s, MOD A2C: 22.9 ml LV vol s, MOD A4C: 21.0 ml LV SV MOD A2C:     26.0 ml LV SV MOD A4C:     43.9 ml LV SV MOD BP:      24.4 ml RIGHT VENTRICLE RV S prime:     6.97 cm/s TAPSE (M-mode): 0.9 cm LEFT ATRIUM             Index       RIGHT ATRIUM           Index LA diam:        4.40 cm 2.79 cm/m  RA Area:     13.90 cm LA Vol (A2C):   27.0 ml 17.11 ml/m RA Volume:   38.10 ml  24.14 ml/m LA Vol (A4C):   25.5 ml 16.16 ml/m LA Biplane Vol: 26.2 ml 16.60 ml/m  AORTIC VALVE LVOT Vmax:   59.50 cm/s LVOT Vmean:  42.200 cm/s LVOT VTI:    0.133 m  AORTA Ao Root diam: 2.90 cm MR Peak grad: 133.6 mmHg  TRICUSPID VALVE MR Vmax:      578.00 cm/s TR Peak grad:   39.2 mmHg                           TR Vmax:        313.00 cm/s                            SHUNTS                           Systemic VTI:  0.13 m                           Systemic Diam: 1.70 cm Lyman Bishop MD Electronically signed by Lyman Bishop MD Signature Date/Time: 07/27/2019/9:19:07 AM    Final    DG Hip Unilat W or Wo Pelvis 2-3 Views Left  Result Date: 07/26/2019 CLINICAL DATA:  Left-sided hip pain after fall EXAM: DG HIP (WITH OR WITHOUT PELVIS) 2-3V LEFT  COMPARISON:  07/21/2019 FINDINGS: Acute mildly comminuted left intertrochanteric fracture without angulation. Femoral head is normally aligned. Pubic symphysis and rami are intact. IMPRESSION: Acute left intertrochanteric fracture Electronically Signed   By: Donavan Foil M.D.   On: 07/26/2019 18:13   DG FEMUR MIN 2 VIEWS LEFT  Result Date: 07/27/2019 CLINICAL DATA:  Left femur IM nail EXAM: DG C-ARM 1-60 MIN; LEFT FEMUR 2 VIEWS CONTRAST:  None FLUOROSCOPY TIME:  Fluoroscopy  Time:  1 minutes 29 seconds Number of Acquired Spot Images: 4 COMPARISON:  07/26/2019 FINDINGS: Four low resolution intraoperative spot views of the left femur. The images demonstrate intramedullary rod and distal screw fixation of the left femur for intertrochanteric fracture. There is anatomic alignment. IMPRESSION: Intraoperative fluoroscopic assistance provided during surgical fixation of proximal left femoral fracture Electronically Signed   By: Donavan Foil M.D.   On: 07/27/2019 16:00    Scheduled Meds:  acetaminophen  500 mg Oral Q6H   docusate sodium  100 mg Oral BID   enoxaparin (LOVENOX) injection  30 mg Subcutaneous Q24H   feeding supplement (ENSURE ENLIVE)  237 mL Oral BID BM   memantine  10 mg Oral Daily   metoprolol tartrate  12.5 mg Oral BID   multivitamin with minerals  1 tablet Oral Daily   Continuous Infusions:  sodium chloride 75 mL/hr at 07/27/19 1557   methocarbamol (ROBAXIN) IV       LOS: 2 days   Time spent: 28 minutes   Darliss Cheney, MD [facility]  07/28/2019, 12:00 PM   To contact the attending provider between 7A-7P or the covering provider during after hours 7P-7A, please log into the web site [email or web address].

## 2019-07-28 NOTE — Plan of Care (Signed)

## 2019-07-28 NOTE — Evaluation (Signed)
Physical Therapy Evaluation Patient Details Name: Cheryl Zamora MRN: [id number] DOB: 04/02/1923 Today's Date: 07/28/2019   History of Present Illness  Pt is a 86 yo f s/p L hip IM nail secondary to fracture from a fall at a nursing home. PMH significant for but not limited to: CABG, a-fib, CHF,OA, back surgery, pacemaker in 2018, dementia  Clinical Impression  [name] s/p above surgery resulting in functional limitations due to the deficits listed below (see PT Problem List).  Patient will benefit from skilled PT to increase their independence and safety with mobility. PT will liekly need PT at SNF to return to baseline level of function, which was limited.        Follow Up Recommendations SNF;Supervision/Assistance - 24 hour    Equipment Recommendations  None recommended by PT    Recommendations for Other Services       Precautions / Restrictions Precautions Precautions: Fall Restrictions Weight Bearing Restrictions: Yes LLE Weight Bearing: Weight bearing as tolerated      Mobility  Bed Mobility Overal bed mobility: Needs Assistance Bed Mobility: Supine to Sit;Sit to Supine     Supine to sit: Max assist Sit to supine: Max assist      Transfers Overall transfer level: (Pt was unwilling to attempt OOB today)                  Ambulation/Gait                Stairs            Wheelchair Mobility    Modified Rankin (Stroke Patients Only)       Balance                                             Pertinent Vitals/Pain Pain Assessment: Faces Faces Pain Scale: Hurts even more Pain Location: lef leg Pain Intervention(s): Limited activity within patient's tolerance;Monitored during session    Home Living Family/patient expects to be discharged to:: Skilled nursing facility                      Prior Function Level of Independence: Needs assistance(per medical record pt was minimally walking, mostly bedbound)                Hand Dominance   Dominant Hand: Right    Extremity/Trunk Assessment   Upper Extremity Assessment Upper Extremity Assessment: Overall WFL for tasks assessed    Lower Extremity Assessment Lower Extremity Assessment: LLE deficits/detail LLE Deficits / Details: significant decrease in AROM and strength due to pain       Communication   Communication: HOH  Cognition Arousal/Alertness: Awake/alert Behavior During Therapy: WFL for tasks assessed/performed Overall Cognitive Status: No family/caregiver present to determine baseline cognitive functioning                                 General Comments: Pt knew she was at [facility], but not why she was here      General Comments General comments (skin integrity, edema, etc.): No family present. Pt cooperative but did not want to get OOB. Sat EOB for about 8 minutes.     Exercises Total Joint Exercises Ankle Circles/Pumps: AROM;Both;5 reps;Supine Heel Slides: AAROM;Left;5 reps;Supine(pt did RLE actively x5 reps) Hip ABduction/ADduction: AAROM;Left;5 reps;Supine(Pt  did AROM to RLE x5 reps) Long Arc Quad: AROM;Both;5 reps;Seated   Assessment/Plan    PT Assessment Patient needs continued PT services  PT Problem List Decreased strength;Decreased range of motion;Decreased activity tolerance;Decreased mobility;Decreased cognition;Decreased knowledge of use of DME;Pain       PT Treatment Interventions DME instruction;Gait training;Functional mobility training;Therapeutic exercise;Patient/family education    PT Goals (Current goals can be found in the Care Plan section)  Acute Rehab PT Goals Patient Stated Goal: Pt could not formulate a goal PT Goal Formulation: Patient unable to participate in goal setting Time For Goal Achievement: 08/04/19 Potential to Achieve Goals: Fair    Frequency Min 3X/week   Barriers to discharge        Co-evaluation               AM-PAC PT "6 Clicks" Mobility   Outcome Measure Help needed turning from your back to your side while in a flat bed without using bedrails?: A Lot Help needed moving from lying on your back to sitting on the side of a flat bed without using bedrails?: A Lot Help needed moving to and from a bed to a chair (including a wheelchair)?: A Lot Help needed standing up from a chair using your arms (e.g., wheelchair or bedside chair)?: A Lot Help needed to walk in hospital room?: Total Help needed climbing 3-5 steps with a railing? : Total 6 Click Score: 10    End of Session   Activity Tolerance: Patient tolerated treatment well Patient left: in bed;with bed alarm set;with call bell/phone within reach Nurse Communication: Mobility status(Spoke with NT. He assisted with Purewick placement at end) PT Visit Diagnosis: History of falling (Z91.81)    Time: 1829-9371 PT Time Calculation (min) (ACUTE ONLY): 28 min   Charges:   PT Evaluation $PT Eval Moderate Complexity: 1 Mod PT Treatments $Therapeutic Exercise: 8-22 mins        Lavonia Dana, PT   Acute Rehabilitation Services  Pager [phone] Office [phone] 07/28/2019   Melvern Banker 07/28/2019, 12:23 PM

## 2019-07-29 DIAGNOSIS — I5043 Acute on chronic combined systolic (congestive) and diastolic (congestive) heart failure: Secondary | ICD-10-CM | POA: Diagnosis not present

## 2019-07-29 DIAGNOSIS — I130 Hypertensive heart and chronic kidney disease with heart failure and stage 1 through stage 4 chronic kidney disease, or unspecified chronic kidney disease: Secondary | ICD-10-CM | POA: Diagnosis not present

## 2019-07-29 DIAGNOSIS — S82401D Unspecified fracture of shaft of right fibula, subsequent encounter for closed fracture with routine healing: Secondary | ICD-10-CM | POA: Diagnosis not present

## 2019-07-29 LAB — CBC WITH DIFFERENTIAL/PLATELET
Abs Immature Granulocytes: 0.1 10*3/uL — ABNORMAL HIGH (ref 0.00–0.07)
Basophils Absolute: 0.1 10*3/uL (ref 0.0–0.1)
Basophils Relative: 0 %
Eosinophils Absolute: 0 10*3/uL (ref 0.0–0.5)
Eosinophils Relative: 0 %
HCT: 40.7 % (ref 36.0–46.0)
Hemoglobin: 12.8 g/dL (ref 12.0–15.0)
Immature Granulocytes: 1 %
Lymphocytes Relative: 12 %
Lymphs Abs: 2.4 10*3/uL (ref 0.7–4.0)
MCH: 27.9 pg (ref 26.0–34.0)
MCHC: 31.4 g/dL (ref 30.0–36.0)
MCV: 88.7 fL (ref 80.0–100.0)
Monocytes Absolute: 1.4 10*3/uL — ABNORMAL HIGH (ref 0.1–1.0)
Monocytes Relative: 8 %
Neutro Abs: 15.1 10*3/uL — ABNORMAL HIGH (ref 1.7–7.7)
Neutrophils Relative %: 79 %
Platelets: 227 10*3/uL (ref 150–400)
RBC: 4.59 MIL/uL (ref 3.87–5.11)
RDW: 15.1 % (ref 11.5–15.5)
WBC: 19.1 10*3/uL — ABNORMAL HIGH (ref 4.0–10.5)
nRBC: 0 % (ref 0.0–0.2)

## 2019-07-29 LAB — BASIC METABOLIC PANEL
Anion gap: 7 (ref 5–15)
BUN: 16 mg/dL (ref 8–23)
CO2: 28 mmol/L (ref 22–32)
Calcium: 9.4 mg/dL (ref 8.9–10.3)
Chloride: 101 mmol/L (ref 98–111)
Creatinine, Ser: 0.91 mg/dL (ref 0.44–1.00)
GFR calc Af Amer: >60 mL/min (ref >60)
GFR calc non Af Amer: 54 mL/min — ABNORMAL LOW (ref >60)
Glucose, Bld: 108 mg/dL — ABNORMAL HIGH (ref 70–99)
Potassium: 3.9 mmol/L (ref 3.5–5.1)
Sodium: 136 mmol/L (ref 135–145)

## 2019-07-29 NOTE — Plan of Care (Signed)
  Problem: Education: Goal: Knowledge of General Education information will improve Description: Including pain rating scale, medication(s)/side effects and non-pharmacologic comfort measures Outcome: Progressing   Problem: Pain Managment: Goal: General experience of comfort will improve Outcome: Progressing   Problem: Safety: Goal: Ability to remain free from injury will improve Outcome: Progressing   

## 2019-07-29 NOTE — Plan of Care (Signed)

## 2019-07-29 NOTE — Progress Notes (Signed)
Patient doing well.  Pain controlled.  Dressings in place, one dressing replaced today. No surrounding erythema or drainage noted.  No pain with logroll of left hip. Left foot warm and well-perfused. Okay for discharge to SNF from orthopedic standpoint.

## 2019-07-29 NOTE — TOC Initial Note (Addendum)
Transition of Care [facility]) - Initial/Assessment Note    Patient Details  Name: Cheryl Zamora MRN: [id number] Date of Birth: August 18, 1923  [facility]) CM/SW Contact:    Emeterio Reeve, [place] Phone Number: 07/29/2019, 1:07 PM  Clinical Narrative:                 Pt spoke with pts nephew [name] via phone [phone]). Pt is oriented x1. [name] stated that pt is from [facility]. [name] stated pt will  Be returning there. Phil stated that pt was able to walk with walker or use her wheelchair to get around.   CSW reviewed pt/ot reccs of SNF placement with phil. [name] stated that he agrees with the SNF recommendations. Phil inquired about pts insurance paying for snf since she is private pay for the LTC bed at [facility]. CSW informed phil that she will put in the auth request.    CSW reached out to [facility] to inquire about SNF bed. CSW was told that she would have to talk to Eliseo Squires or Schuyler Amor about bed availability and both are off until Tuesday.   CSW will continue to follow.   Expected Discharge Plan: Skilled Nursing Facility Barriers to Discharge: Continued Medical Work up   Patient Goals and CMS Choice Patient states their goals for this hospitalization and ongoing recovery are:: Pt was not able to verbalize goals. CMS Medicare.gov Compare Post Acute Care list provided to:: Patient Choice offered to / list presented to : Patient  Expected Discharge Plan and Services Expected Discharge Plan: [place] arrangements for the past 2 months: [place]                                      Prior Living Arrangements/Services Living arrangements for the past 2 months: [place] Lives with:: Facility Resident Patient language and need for interpreter reviewed:: Yes Do you feel safe going back to the place where you live?: Yes      Need for Family Participation in Patient Care: Yes  (Comment) Care giver support system in place?: Yes (comment)   Criminal Activity/Legal Involvement Pertinent to Current Situation/Hospitalization: No - Comment as needed  Activities of Daily Living Home Assistive Devices/Equipment: Walker (specify type), Eyeglasses ADL Screening (condition at time of admission) Patient's cognitive ability adequate to safely complete daily activities?: No Is the patient deaf or have difficulty hearing?: Yes Does the patient have difficulty seeing, even when wearing glasses/contacts?: No Does the patient have difficulty concentrating, remembering, or making decisions?: Yes Patient able to express need for assistance with ADLs?: Yes Does the patient have difficulty dressing or bathing?: Yes Independently performs ADLs?: No Communication: Independent Dressing (OT): Needs assistance Is this a change from baseline?: Pre-admission baseline Grooming: Needs assistance Is this a change from baseline?: Pre-admission baseline Feeding: Needs assistance Is this a change from baseline?: Pre-admission baseline Bathing: Needs assistance Is this a change from baseline?: Pre-admission baseline Toileting: Needs assistance Is this a change from baseline?: Pre-admission baseline In/Out Bed: Needs assistance Is this a change from baseline?: Pre-admission baseline Walks in Home: Needs assistance Is this a change from baseline?: Pre-admission baseline Does the patient have difficulty walking or climbing stairs?: Yes Weakness of Legs: Both Weakness of Arms/Hands: Both  Permission Sought/Granted      Share Information with NAME: [name]  Alfonse Alpers  Permission granted to share info w AGENCY: [facility]  Permission granted to share info w Relationship: Nephew  Permission granted to share info w Contact Information: [phone]  Emotional Assessment Appearance:: Appears stated age Attitude/Demeanor/Rapport: Unable to Assess Affect (typically observed): Unable to  Assess Orientation: : Oriented to Self Alcohol / Substance Use: Not Applicable Psych Involvement: No (comment)  Admission diagnosis:  Closed left hip fracture ([place]) [S72.002A] Closed fracture of left hip, initial encounter ([place]) [S72.002A] Fall, initial encounter [W19.XXXA] Patient Active Problem List   Diagnosis Date Noted  . Displaced intertrochanteric fracture of left femur, initial encounter for closed fracture ([place]) 07/26/2019  . Leukocytosis 07/26/2019  . Hydronephrosis of left kidney 03/26/2019  . Staghorn renal calculus 03/26/2019  . Nodule of middle lobe of right lung 03/26/2019  . Palliative care by specialist   . DNR (do not resuscitate) discussion   . Persistent atrial fibrillation ([place])   . CHF (congestive heart failure) ([place]) 11/09/2016  . DVT (deep venous thrombosis) ([place]) 01/13/2015  . Cellulitis of both lower extremities 01/13/2015  . Cellulitis 01/13/2015  . Acute deep vein thrombosis (DVT) of popliteal vein of left lower extremity ([place])   . Hematuria   . Acute on chronic renal insufficiency 01/06/2015  . Atrial fibrillation with RVR ([place])   . Sepsis ([place]) 01/02/2015  . UTI (lower urinary tract infection) 01/02/2015  . Paroxysmal atrial fibrillation (HCC)   . Chronic diastolic (congestive) heart failure ([place])   . Atrial fibrillation with rapid ventricular response ([place])   . Acute on chronic combined systolic and diastolic congestive heart failure ([place]) 04/12/2014  . CKD (chronic kidney disease) stage 3, GFR 30-59 ml/min 04/11/2014  . CAD, multiple vessel, hx CABG 2012 04/11/2014  . Long term current use of anticoagulant therapy 12/21/2012  . Physical deconditioning 12/17/2012  . PAF (paroxysmal atrial fibrillation) ([place]) 12/12/2012  . Chronic diastolic CHF (congestive heart failure), NYHA class 3 ([place]) 12/12/2012  . Essential hypertension   . Neuropathy    PCP:  Welford Roche, NP Pharmacy:   [facility] [place], [place] - [address]  [address] [place] [address] Phone: [phone] Fax: [phone]  [place], [place] [address] AT [facility] [address] [place] [address] Phone: [phone] Fax: [phone]     Social Determinants of Health (SDOH) Interventions    Readmission Risk Interventions No flowsheet data found.  Emeterio Reeve, Latanya Presser, [place] Social Worker [phone]

## 2019-07-29 NOTE — NC FL2 (Signed)
[place] LEVEL OF CARE SCREENING TOOL     IDENTIFICATION  Patient Name: Cheryl Zamora Birthdate: [birth year] Sex: female Admission Date (Current Location): 07/26/2019  [facility] and [place] Number:  Herbalist and Address:  [facility] [facility] [place] [address] [place], [place] [address]      Provider Number: [id number]  Attending Physician Name and Address:  Darliss Cheney, MD  Relative Name and Phone Number:       Current Level of Care: Hospital Recommended Level of Care: [place] Prior Approval Number:    Date Approved/Denied:   PASRR Number: [id number]  Discharge Plan: SNF    Current Diagnoses: Patient Active Problem List   Diagnosis Date Noted  . Displaced intertrochanteric fracture of left femur, initial encounter for closed fracture ([place]) 07/26/2019  . Leukocytosis 07/26/2019  . Hydronephrosis of left kidney 03/26/2019  . Staghorn renal calculus 03/26/2019  . Nodule of middle lobe of right lung 03/26/2019  . Palliative care by specialist   . DNR (do not resuscitate) discussion   . Persistent atrial fibrillation ([place])   . CHF (congestive heart failure) ([place]) 11/09/2016  . DVT (deep venous thrombosis) ([place]) 01/13/2015  . Cellulitis of both lower extremities 01/13/2015  . Cellulitis 01/13/2015  . Acute deep vein thrombosis (DVT) of popliteal vein of left lower extremity ([place])   . Hematuria   . Acute on chronic renal insufficiency 01/06/2015  . Atrial fibrillation with RVR ([place])   . Sepsis ([place]) 01/02/2015  . UTI (lower urinary tract infection) 01/02/2015  . Paroxysmal atrial fibrillation (HCC)   . Chronic diastolic (congestive) heart failure ([place])   . Atrial fibrillation with rapid ventricular response ([place])   . Acute on chronic combined systolic and diastolic congestive heart failure ([place]) 04/12/2014  . CKD (chronic kidney disease) stage 3, GFR 30-59 ml/min 04/11/2014  . CAD, multiple vessel, hx CABG 2012  04/11/2014  . Long term current use of anticoagulant therapy 12/21/2012  . Physical deconditioning 12/17/2012  . PAF (paroxysmal atrial fibrillation) ([place]) 12/12/2012  . Chronic diastolic CHF (congestive heart failure), NYHA class 3 ([place]) 12/12/2012  . Essential hypertension   . Neuropathy     Orientation RESPIRATION BLADDER Height & Weight     Self  Normal Incontinent Weight: 125 lb (56.7 kg) Height:  5\' 3"  (160 cm)  BEHAVIORAL SYMPTOMS/MOOD NEUROLOGICAL BOWEL NUTRITION STATUS      Incontinent Diet(See discharge summary)  AMBULATORY STATUS COMMUNICATION OF NEEDS Skin   Extensive Assist Verbally Normal                       Personal Care Assistance Level of Assistance  Bathing, Feeding, Dressing Bathing Assistance: Maximum assistance Feeding assistance: Limited assistance Dressing Assistance: Maximum assistance     Functional Limitations Info  Sight, Hearing, Speech Sight Info: Adequate Hearing Info: Adequate Speech Info: Adequate    SPECIAL CARE FACTORS FREQUENCY  PT (By licensed PT), OT (By licensed OT)     PT Frequency: 5x a week OT Frequency: 5x a week            Contractures Contractures Info: Not present    Additional Factors Info  Code Status, Allergies Code Status Info: DNR Allergies Info: Digoxin And Related Amiodarone Codeine Penicillins           Current Medications (07/29/2019):  This is the current hospital active medication list Current Facility-Administered Medications  Medication Dose Route Frequency Provider Last Rate Last Admin  .  0.9 %  sodium chloride infusion   Intravenous Continuous Leandrew Koyanagi, MD 75 mL/hr at 07/27/19 1557 New Bag at 07/27/19 1557  . acetaminophen (TYLENOL) tablet 325-650 mg  325-650 mg Oral Q6H PRN Leandrew Koyanagi, MD      . alum & mag hydroxide-simeth (MAALOX/MYLANTA) 200-200-20 MG/5ML suspension 30 mL  30 mL Oral Q4H PRN Leandrew Koyanagi, MD      . docusate sodium (COLACE) capsule 100 mg  100 mg Oral BID Leandrew Koyanagi, MD   100 mg at 07/29/19 1033  . enoxaparin (LOVENOX) injection 30 mg  30 mg Subcutaneous Q24H Leandrew Koyanagi, MD   30 mg at 07/29/19 1036  . feeding supplement (ENSURE ENLIVE) (ENSURE ENLIVE) liquid 237 mL  237 mL Oral BID BM Leandrew Koyanagi, MD   237 mL at 07/27/19 2005  . hydrALAZINE (APRESOLINE) injection 10 mg  10 mg Intravenous Q6H PRN Leandrew Koyanagi, MD   10 mg at 07/27/19 1558  . HYDROcodone-acetaminophen (NORCO) 7.5-325 MG per tablet 1-2 tablet  1-2 tablet Oral Q4H PRN Leandrew Koyanagi, MD   1 tablet at 07/28/19 0926  . HYDROcodone-acetaminophen (NORCO/VICODIN) 5-325 MG per tablet 1-2 tablet  1-2 tablet Oral Q4H PRN Leandrew Koyanagi, MD   2 tablet at 07/29/19 1033  . magnesium citrate solution 1 Bottle  1 Bottle Oral Once PRN Leandrew Koyanagi, MD      . memantine [facility]) tablet 10 mg  10 mg Oral Daily Leandrew Koyanagi, MD   10 mg at 07/29/19 1033  . menthol-cetylpyridinium (CEPACOL) lozenge 3 mg  1 lozenge Oral PRN Leandrew Koyanagi, MD       Or  . phenol (CHLORASEPTIC) mouth spray 1 spray  1 spray Mouth/Throat PRN Leandrew Koyanagi, MD      . methocarbamol (ROBAXIN) tablet 500 mg  500 mg Oral Q6H PRN Leandrew Koyanagi, MD   500 mg at 07/29/19 1033   Or  . methocarbamol (ROBAXIN) 500 mg in dextrose 5 % 50 mL IVPB  500 mg Intravenous Q6H PRN Leandrew Koyanagi, MD      . metoprolol tartrate (LOPRESSOR) tablet 12.5 mg  12.5 mg Oral BID Leandrew Koyanagi, MD   12.5 mg at 07/29/19 1033  . morphine 2 MG/ML injection 1 mg  1 mg Intravenous Q2H PRN Leandrew Koyanagi, MD      . multivitamin with minerals tablet 1 tablet  1 tablet Oral Daily Leandrew Koyanagi, MD   1 tablet at 07/29/19 1033  . ondansetron (ZOFRAN) tablet 4 mg  4 mg Oral Q6H PRN Leandrew Koyanagi, MD       Or  . ondansetron [facility]) injection 4 mg  4 mg Intravenous Q6H PRN Leandrew Koyanagi, MD      . polyethylene glycol (MIRALAX / GLYCOLAX) packet 17 g  17 g Oral Daily PRN Leandrew Koyanagi, MD      . sorbitol 70 % solution 30 mL  30 mL Oral Daily PRN Leandrew Koyanagi, MD          Discharge Medications: Please see discharge summary for a list of discharge medications.  Relevant Imaging Results:  Relevant Lab Results:   Additional Information SS#: [id number]  Emeterio Reeve, [place]

## 2019-07-29 NOTE — Progress Notes (Signed)
PROGRESS NOTE    Cheryl Zamora  GSU:[id number] DOB: 1923-06-03 DOA: 07/26/2019 PCP: Welford Roche, NP   Brief Narrative:  86 year old female with past medical history of dementia, A. fib not on any anticoagulation, history of CAD status post CABG, hypertension, CKD stage IIIa, sick sinus syndrome with pacemaker, chronic diastolic CHF presented to ED with unwitnessed fall and subsequently was diagnosed with acute left intertrochanteric fracture.  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  Chest x-ray showed right lower rib fracture which appears to be subacute.  She was admitted to hospital service and orthopedics were consulted.  Assessment & Plan:   Principal Problem:   Displaced intertrochanteric fracture of left femur, initial encounter for closed fracture (HCC) Active Problems:   PAF (paroxysmal atrial fibrillation) (HCC)   Chronic diastolic CHF (congestive heart failure), NYHA class 3 (HCC)   Essential hypertension   CKD (chronic kidney disease) stage 3, GFR 30-59 ml/min   CAD, multiple vessel, hx CABG 2012   Paroxysmal atrial fibrillation (HCC)   Chronic diastolic (congestive) heart failure (HCC)   Nodule of middle lobe of right lung   Leukocytosis   Closed left hip fracture ([place]) -orthopedics on board.  They consulted cardiology for cardiac clearance and patient was cleared.  [name] status post ORIF with intramedullary implant  07/27/2019.  Management per orthopedics.  . Essential hypertension: Fairly controlled.  Continue metoprolol and as needed hydralazine.  . CAD, multiple vessel, hx CABG 2012 -chronic stable patient not reporting any chest pain or shortness of breath.  Seen and cleared by cardiology.   Marland Kitchen PAF (paroxysmal atrial fibrillation) (HCC)-not on anticoagulation given risk of  falls as well as hematuria.  Continue metoprolol blood pressure well.  Currently in sinus rhythm.   . Chronic diastolic CHF (congestive heart failure), NYHA class 3 ([place]) -currently appears to be euvolemic avoid over aggressive fluids Appreciate cardiology input regarding perioperative cardiac monitoring and management   . CKD (chronic kidney disease) stage 3a: stable continue to monitor  . Leukocytosis -it is chronic.  No signs of infection.  Monitor.  Hypokalemia: Resolved.  Right lower lobe fractures possibly subacute patient with multiple falls.  She has no symptoms or any pain.  She is not hypoxic.  Continue incentive spirometry.  Dementia: Continue Namenda.  DVT prophylaxis: Lovenox    Code Status: DNR  Family Communication:  None present at bedside.  [name] from: SNF Disposition Plan: SNF Barriers to discharge: Placement   Status is: Inpatient  Remains inpatient appropriate because:Inpatient level of care appropriate due to severity of illness   Dispo: The patient is from: SNF              Anticipated d/c is to: SNF              Anticipated d/c date is:1- 2 days              Patient currently is medically stable to DC         Estimated body mass index is 22.14 kg/m as calculated from the following:   Height as of this encounter: 5\' 3"  (1.6 m).   Weight as of this encounter: 56.7 kg.  Pressure Ulcer 01/13/15 Stage I -  Intact skin with non-blanchable redness of a localized area usually over a bony prominence. (Active)  01/13/15  0839  Location: Buttocks  Location Orientation: Right;Left  Staging: Stage I -  Intact skin with non-blanchable redness of a localized area usually over a bony prominence.  Wound Description (Comments):   Present on Admission: Yes     Nutritional status:  Nutrition Problem: Increased nutrient needs Etiology: hip fracture   Signs/Symptoms: estimated needs   Interventions: Refer to RD note for  recommendations    Consultants:   Orthopedics  Cardiology  Procedures:   Left hip ORIF with intramedullary implant 07/27/2019  Antimicrobials:  Anti-infectives (From admission, onward)   Start     Dose/Rate Route Frequency Ordered Stop   07/27/19 1900  ceFAZolin (ANCEF) IVPB 2g/100 mL premix     2 g 200 mL/hr over 30 Minutes Intravenous Every 6 hours 07/27/19 1543 07/28/19 0641   07/27/19 1315  ceFAZolin (ANCEF) IVPB 2g/100 mL premix     2 g 200 mL/hr over 30 Minutes Intravenous To [facility] 07/26/19 2334 07/27/19 1310         Subjective: Seen and examined.  Eating breakfast in the bed.  Has no complaints.  Objective: Vitals:   07/28/19 1508 07/28/19 1938 07/29/19 0410 07/29/19 0812  BP: (!) 138/49 140/62 138/66 (!) 154/70  Pulse: 70 70 78 70  Resp: 18 16 16 18   Temp: 97.7 F (36.5 C) 98.6 F (37 C) 98.2 F (36.8 C) 98.1 F (36.7 C)  TempSrc: Oral Oral Oral Oral  SpO2: 96% 96% 96% 100%  Weight:      Height:        Intake/Output Summary (Last 24 hours) at 07/29/2019 1059 Last data filed at 07/29/2019 0410 Gross per 24 hour  Intake 240 ml  Output 3201 ml  Net -2961 ml   Filed Weights   07/27/19 1237  Weight: 56.7 kg    Examination:  General exam: Appears calm and comfortable  Respiratory system: Clear to auscultation. Respiratory effort normal. Cardiovascular system: S1 & S2 heard, RRR. No JVD, murmurs, rubs, gallops or clicks. No pedal edema. Gastrointestinal system: Abdomen is nondistended, soft and nontender. No organomegaly or masses felt. Normal bowel sounds heard. Central nervous system: Alert and oriented x1. No focal neurological deficits. Extremities: Symmetric 5 x 5 power. Skin: No rashes, lesions or ulcers.  Psychiatry: Judgement and insight appear poor. Mood & affect flat    Data Reviewed: I have personally reviewed following labs and imaging studies  CBC: Recent Labs  Lab 07/26/19 1900 07/27/19 0600 07/28/19 0947  07/29/19 0348  WBC 13.5* 16.5* 22.2* 19.1*  NEUTROABS 11.1*  --  20.3* 15.1*  HGB 15.4* 14.5 12.1 12.8  HCT 49.2* 45.2 38.1 40.7  MCV 89.8 86.6 87.8 88.7  PLT 231 237 200 846   Basic Metabolic Panel: Recent Labs  Lab 07/26/19 1900 07/27/19 0600 07/28/19 0947 07/29/19 0348  NA 139 137 135 136  K 3.5 3.3* 3.8 3.9  CL 102 102 99 101  CO2 25 26 24 28   GLUCOSE 145* 155* 271* 108*  BUN 14 12 14 16   CREATININE 0.90 0.88 1.09* 0.91  CALCIUM 9.4 9.6 9.3 9.4   GFR: Estimated Creatinine Clearance: 32 mL/min (by C-G formula based on SCr of 0.91 mg/dL). Liver Function Tests: Recent Labs  Lab 07/26/19 1900  AST 19  ALT 14  ALKPHOS 69  BILITOT 1.0  PROT 7.0  ALBUMIN 3.9   No results for input(s): LIPASE, AMYLASE in the last 168 hours. No results for input(s): AMMONIA in the last 168 hours. Coagulation Profile: No results  for input(s): INR, PROTIME in the last 168 hours. Cardiac Enzymes: No results for input(s): CKTOTAL, CKMB, CKMBINDEX, TROPONINI in the last 168 hours. BNP (last 3 results) No results for input(s): PROBNP in the last 8760 hours. HbA1C: No results for input(s): HGBA1C in the last 72 hours. CBG: No results for input(s): GLUCAP in the last 168 hours. Lipid Profile: No results for input(s): CHOL, HDL, LDLCALC, TRIG, CHOLHDL, LDLDIRECT in the last 72 hours. Thyroid Function Tests: No results for input(s): TSH, T4TOTAL, FREET4, T3FREE, THYROIDAB in the last 72 hours. Anemia Panel: No results for input(s): VITAMINB12, FOLATE, FERRITIN, TIBC, IRON, RETICCTPCT in the last 72 hours. Sepsis Labs: No results for input(s): PROCALCITON, LATICACIDVEN in the last 168 hours.  Recent Results (from the past 240 hour(s))  SARS Coronavirus 2 by RT PCR (hospital order, performed in [facility] hospital lab) Nasopharyngeal Nasopharyngeal Swab     Status: None   Collection Time: 07/26/19  7:00 PM   Specimen: Nasopharyngeal Swab  Result Value Ref Range Status   SARS  Coronavirus 2 NEGATIVE NEGATIVE Final    Comment: (NOTE) SARS-CoV-2 target nucleic acids are NOT DETECTED. The SARS-CoV-2 RNA is generally detectable in upper and lower respiratory specimens during the acute phase of infection. The lowest concentration of SARS-CoV-2 viral copies this assay can detect is 250 copies / mL. A negative result does not preclude SARS-CoV-2 infection and should not be used as the sole basis for treatment or other patient management decisions.  A negative result may occur with improper specimen collection / handling, submission of specimen other than nasopharyngeal swab, presence of viral mutation(s) within the areas targeted by this assay, and inadequate number of viral copies (<250 copies / mL). A negative result must be combined with clinical observations, patient history, and epidemiological information. Fact Sheet for Patients:   StrictlyIdeas.no Fact Sheet for Healthcare Providers: BankingDealers.co.za This test is not yet approved or cleared  by the [place] FDA and has been authorized for detection and/or diagnosis of SARS-CoV-2 by FDA under an Emergency Use Authorization (EUA).  This EUA will remain in effect (meaning this test can be used) for the duration of the COVID-19 declaration under Section 564(b)(1) of the Act, 21 U.S.C. section 360bbb-3(b)(1), unless the authorization is terminated or revoked sooner. Performed at [facility], [place] [address], [place], [place] [address]   Surgical pcr screen     Status: None   Collection Time: 07/27/19 12:28 AM   Specimen: Nasal Mucosa; Nasal Swab  Result Value Ref Range Status   MRSA, PCR NEGATIVE NEGATIVE Final   Staphylococcus aureus NEGATIVE NEGATIVE Final    Comment: (NOTE) The Xpert SA Assay (FDA approved for NASAL specimens in patients 39 years of age and older), is one component of a comprehensive surveillance program. It is  not intended to diagnose infection nor to guide or monitor treatment. Performed at [facility], [place] [address], [place], [place] [address]       Radiology Studies: Pelvis Portable  Result Date: 07/27/2019 CLINICAL DATA:  Postop EXAM: PORTABLE PELVIS 1-2 VIEWS COMPARISON:  07/26/2019 FINDINGS: Incompletely visualized intramedullary rod within the left femur, bridging a left intertrochanteric fracture with anatomic alignment on single view. Hardware appears intact. Vascular calcifications. IMPRESSION: Partially visualized intramedullary left femoral rod for intertrochanteric fracture. Gas in the soft tissues consistent with recent surgery. Electronically Signed   By: Donavan Foil M.D.   On: 07/27/2019 16:00   DG C-Arm 1-60 Min  Result Date: 07/27/2019  CLINICAL DATA:  Left femur IM nail EXAM: DG C-ARM 1-60 MIN; LEFT FEMUR 2 VIEWS CONTRAST:  None FLUOROSCOPY TIME:  Fluoroscopy Time:  1 minutes 29 seconds Number of Acquired Spot Images: 4 COMPARISON:  07/26/2019 FINDINGS: Four low resolution intraoperative spot views of the left femur. The images demonstrate intramedullary rod and distal screw fixation of the left femur for intertrochanteric fracture. There is anatomic alignment. IMPRESSION: Intraoperative fluoroscopic assistance provided during surgical fixation of proximal left femoral fracture Electronically Signed   By: Donavan Foil M.D.   On: 07/27/2019 16:00   DG FEMUR MIN 2 VIEWS LEFT  Result Date: 07/27/2019 CLINICAL DATA:  Left femur IM nail EXAM: DG C-ARM 1-60 MIN; LEFT FEMUR 2 VIEWS CONTRAST:  None FLUOROSCOPY TIME:  Fluoroscopy Time:  1 minutes 29 seconds Number of Acquired Spot Images: 4 COMPARISON:  07/26/2019 FINDINGS: Four low resolution intraoperative spot views of the left femur. The images demonstrate intramedullary rod and distal screw fixation of the left femur for intertrochanteric fracture. There is anatomic alignment. IMPRESSION: Intraoperative fluoroscopic assistance  provided during surgical fixation of proximal left femoral fracture Electronically Signed   By: Donavan Foil M.D.   On: 07/27/2019 16:00    Scheduled Meds: . docusate sodium  100 mg Oral BID  . enoxaparin (LOVENOX) injection  30 mg Subcutaneous Q24H  . feeding supplement (ENSURE ENLIVE)  237 mL Oral BID BM  . memantine  10 mg Oral Daily  . metoprolol tartrate  12.5 mg Oral BID  . multivitamin with minerals  1 tablet Oral Daily   Continuous Infusions: . sodium chloride 75 mL/hr at 07/27/19 1557  . methocarbamol (ROBAXIN) IV       LOS: 3 days   Time spent: 27 minutes   Darliss Cheney, MD [facility]  07/29/2019, 10:59 AM   To contact the attending provider between 7A-7P or the covering provider during after hours 7P-7A, please log into the web site [email or web address].

## 2019-07-30 ENCOUNTER — Encounter (HOSPITAL_COMMUNITY): Payer: Self-pay | Admitting: Internal Medicine

## 2019-07-30 LAB — CBC WITH DIFFERENTIAL/PLATELET
Abs Immature Granulocytes: 0.06 10*3/uL (ref 0.00–0.07)
Basophils Absolute: 0.1 10*3/uL (ref 0.0–0.1)
Basophils Relative: 1 %
Eosinophils Absolute: 0.3 10*3/uL (ref 0.0–0.5)
Eosinophils Relative: 3 %
HCT: 36.9 % (ref 36.0–46.0)
Hemoglobin: 11.8 g/dL — ABNORMAL LOW (ref 12.0–15.0)
Immature Granulocytes: 1 %
Lymphocytes Relative: 20 %
Lymphs Abs: 2.4 10*3/uL (ref 0.7–4.0)
MCH: 28.4 pg (ref 26.0–34.0)
MCHC: 32 g/dL (ref 30.0–36.0)
MCV: 88.7 fL (ref 80.0–100.0)
Monocytes Absolute: 1.1 10*3/uL — ABNORMAL HIGH (ref 0.1–1.0)
Monocytes Relative: 10 %
Neutro Abs: 7.7 10*3/uL (ref 1.7–7.7)
Neutrophils Relative %: 65 %
Platelets: 214 10*3/uL (ref 150–400)
RBC: 4.16 MIL/uL (ref 3.87–5.11)
RDW: 15 % (ref 11.5–15.5)
WBC: 11.7 10*3/uL — ABNORMAL HIGH (ref 4.0–10.5)
nRBC: 0 % (ref 0.0–0.2)

## 2019-07-30 LAB — SARS CORONAVIRUS 2 BY RT PCR (HOSPITAL ORDER, PERFORMED IN ~~LOC~~ HOSPITAL LAB): SARS Coronavirus 2: NEGATIVE

## 2019-07-30 NOTE — Evaluation (Signed)
Occupational Therapy Evaluation Patient Details Name: Cheryl Zamora MRN: [id number] DOB: [birth year] Today's Date: 07/30/2019    History of Present Illness Pt is a 86 yo f s/p L hip IM nail secondary to fracture from a fall at a nursing home. PMH significant for but not limited to: CABG, a-fib, CHF,OA, back surgery, pacemaker in 2018, dementia   Clinical Impression   Limited information provided by pt due to cognitive limitations. Per chart, pt was minimally walking. Pt currently requires minA for oral care, maxA for LB ADL. Unable to safely attempt full upright standing this session, pt with increased pain and requiring totalA from therapist. Recommend d/c to SNF with continued OT services to maximize safety and independence with ADL/IADL and functional mobility. Pt will continue to benefit from skilled OT services to maximize safety and independence with ADL/IADL and functional mobility. Will continue to follow acutely and progress as tolerated.       Follow Up Recommendations  SNF;Supervision/Assistance - 24 hour    Equipment Recommendations  3 in 1 bedside commode;Hospital bed    Recommendations for Other Services       Precautions / Restrictions Precautions Precautions: Fall Restrictions Weight Bearing Restrictions: Yes LLE Weight Bearing: Weight bearing as tolerated      Mobility Bed Mobility          General bed mobility comments: pt sitting in recliner upon arrival  Transfers Overall transfer level: Needs assistance   Transfers: Sit to/from Stand Sit to Stand: Total assist         General transfer comment: attempted to stand, pt unable with one person assist     Balance Overall balance assessment: History of Falls;Needs assistance Sitting-balance support: Single extremity supported;Feet supported Sitting balance-Leahy Scale: Poor Sitting balance - Comments: reliant on single UE support while sitting and modA intermittently for support while sitting from  therapist Postural control: Posterior lean     Standing balance comment: deferred                           ADL either performed or assessed with clinical judgement   ADL Overall ADL's : Needs assistance/impaired Eating/Feeding: Set up;Sitting   Grooming: Minimal assistance;Cueing for sequencing;Sitting Grooming Details (indicate cue type and reason): oral care with sequencing for steps Upper Body Bathing: Minimal assistance;Sitting   Lower Body Bathing: Maximal assistance;Sitting/lateral leans   Upper Body Dressing : Minimal assistance;Sitting   Lower Body Dressing: Maximal assistance;Sitting/lateral leans     Toilet Transfer Details (indicate cue type and reason): deferred   Toileting - Clothing Manipulation Details (indicate cue type and reason): deferred       General ADL Comments: pt limited to seated activities this session due to pain with attempts to stand;due to cognitive limitations, pt required cues for sequencing     Vision         Perception     Praxis      Pertinent Vitals/Pain Pain Assessment: Faces Faces Pain Scale: Hurts even more Pain Location: left leg Pain Descriptors / Indicators: Grimacing;Guarding Pain Intervention(s): Limited activity within patient's tolerance;Monitored during session     Hand Dominance Right   Extremity/Trunk Assessment Upper Extremity Assessment Upper Extremity Assessment: Generalized weakness   Lower Extremity Assessment Lower Extremity Assessment: Defer to PT evaluation LLE Deficits / Details: significant decrease in AROM and strength due to pain   Cervical / Trunk Assessment Cervical / Trunk Assessment: Kyphotic   Communication Communication Communication: [facility]  Cognition Arousal/Alertness: Awake/alert Behavior During Therapy: WFL for tasks assessed/performed Overall Cognitive Status: No family/caregiver present to determine baseline cognitive functioning                                  General Comments: Pt cooperative and follows 1 step commands when she can hear them   General Comments  no family or caregiver present during session;pt cooperative throughout session    Exercises    Shoulder Instructions      Home Living Family/patient expects to be discharged to:: Skilled nursing facility                                 Additional Comments: nephew assists with transport and meals      Prior Functioning/Environment Level of Independence: Needs assistance(per medical record pt was minimally walking, mostly bedbound)        Comments: after fall, pt has not been able to function well at home.  Multiple falls; nephew carrying her places        OT Problem List: Decreased strength;Decreased range of motion;Decreased activity tolerance      OT Treatment/Interventions: Self-care/ADL training;Therapeutic exercise;Energy conservation;DME and/or AE instruction;Therapeutic activities;Cognitive remediation/compensation;Patient/family education;Balance training    OT Goals(Current goals can be found in the care plan section) Acute Rehab OT Goals Patient Stated Goal: Pt could not formulate a goal OT Goal Formulation: Patient unable to participate in goal setting Time For Goal Achievement: 08/13/19 Potential to Achieve Goals: Fair ADL Goals Pt Will Perform Eating: with set-up;sitting Pt Will Perform Grooming: with set-up;sitting Pt Will Transfer to Toilet: with mod assist;ambulating  OT Frequency: Min 2X/week   Barriers to D/C:            Co-evaluation              AM-PAC OT "6 Clicks" Daily Activity     Outcome Measure Help from another person eating meals?: A Little Help from another person taking care of personal grooming?: A Little Help from another person toileting, which includes using toliet, bedpan, or urinal?: A Lot Help from another person bathing (including washing, rinsing, drying)?: A Lot Help from another person to  put on and taking off regular upper body clothing?: A Little Help from another person to put on and taking off regular lower body clothing?: A Lot 6 Click Score: 15   End of Session Equipment Utilized During Treatment: Gait belt Nurse Communication: Mobility status  Activity Tolerance: Patient limited by pain Patient left: in chair;with call bell/phone within reach;with chair alarm set  OT Visit Diagnosis: Other abnormalities of gait and mobility (R26.89);Repeated falls (R29.6);Muscle weakness (generalized) (M62.81);History of falling (Z91.81);Other symptoms and signs involving cognitive function;Pain Pain - Right/Left: Left Pain - part of body: Hip                Time: 9381-8299 OT Time Calculation (min): 13 min Charges:  OT General Charges $OT Visit: 1 Visit OT Evaluation $OT Eval Moderate Complexity: [place] OTR/L Acute Rehabilitation Services Office: [place] 07/30/2019, 12:31 PM

## 2019-07-30 NOTE — Plan of Care (Signed)

## 2019-07-30 NOTE — Progress Notes (Signed)
     Subjective: 3 Days Post-Op Procedure(s) (LRB): INTRAMEDULLARY (IM) NAIL INTERTROCHANTRIC (Left) Somulent, not feeding herself, left hip dressing intact and dry.  Patient reports pain as mild.    Objective:   VITALS:  Temp:  [98.2 F (36.8 C)-98.6 F (37 C)] 98.2 F (36.8 C) (05/31 0808) Pulse Rate:  [69-73] 69 (05/31 0808) Resp:  [14-16] 16 (05/31 0808) BP: (123-152)/(47-81) 125/49 (05/31 0808) SpO2:  [96 %-98 %] 97 % (05/31 0808)  Neurologically intact ABD soft Neurovascular intact Sensation intact distally Intact pulses distally Incision: dressing C/D/I and no drainage   LABS Recent Labs    07/28/19 0947 07/28/19 0947 07/29/19 0348 07/30/19 0316  HGB 12.1  --  12.8 11.8*  WBC 22.2*   < > 19.1* 11.7*  PLT 200   < > 227 214   < > = values in this interval not displayed.   Recent Labs    07/28/19 0947 07/29/19 0348  NA 135 136  K 3.8 3.9  CL 99 101  CO2 24 28  BUN 14 16  CREATININE 1.09* 0.91  GLUCOSE 271* 108*   No results for input(s): LABPT, INR in the last 72 hours.   Assessment/Plan: 3 Days Post-Op Procedure(s) (LRB): INTRAMEDULLARY (IM) NAIL INTERTROCHANTRIC (Left)  Discharge to SNF  Orthopaedically stable. Not feeding herself, likely needs assistance with feeding.   Basil Dess 07/30/2019, 1:04 PMPatient ID: Cheryl Zamora, female   DOB: April 09, 1923, 86 y.o.   MRN: [id number]

## 2019-07-30 NOTE — Progress Notes (Signed)
PROGRESS NOTE    Cheryl Zamora  WJX:[id number] DOB: 1923/12/21 DOA: 07/26/2019 PCP: Welford Roche, NP   Brief Narrative:  86 year old female with past medical history of dementia, A. fib not on any anticoagulation, history of CAD status post CABG, hypertension, CKD stage IIIa, sick sinus syndrome with pacemaker, chronic diastolic CHF presented to ED with unwitnessed fall and subsequently was diagnosed with acute left intertrochanteric fracture.  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  Chest x-ray showed right lower rib fracture which appears to be subacute.  She was admitted to hospital service and orthopedics were consulted.  Assessment & Plan:   Principal Problem:   Displaced intertrochanteric fracture of left femur, initial encounter for closed fracture (HCC) Active Problems:   PAF (paroxysmal atrial fibrillation) (HCC)   Chronic diastolic CHF (congestive heart failure), NYHA class 3 (HCC)   Essential hypertension   CKD (chronic kidney disease) stage 3, GFR 30-59 ml/min   CAD, multiple vessel, hx CABG 2012   Paroxysmal atrial fibrillation (HCC)   Chronic diastolic (congestive) heart failure (HCC)   Nodule of middle lobe of right lung   Leukocytosis   Closed left hip fracture ([place]) -orthopedics on board.  They consulted cardiology for cardiac clearance and patient was cleared.  [name] status post ORIF with intramedullary implant  07/27/2019.  Management per orthopedics.  . Essential hypertension: Fairly controlled.  Continue metoprolol and as needed hydralazine.  . CAD, multiple vessel, hx CABG 2012 -chronic stable patient not reporting any chest pain or shortness of breath.  Seen and cleared by cardiology.   Marland Kitchen PAF (paroxysmal atrial fibrillation) (HCC)-not on anticoagulation given risk of  falls as well as hematuria.  Continue metoprolol blood pressure well.  Currently in sinus rhythm.   . Chronic diastolic CHF (congestive heart failure), NYHA class 3 ([place]) -currently appears to be euvolemic avoid over aggressive fluids Appreciate cardiology input regarding perioperative cardiac monitoring and management   . CKD (chronic kidney disease) stage 3a: stable continue to monitor  . Leukocytosis -it is chronic.  No signs of infection.  Monitor.  Hypokalemia: Resolved.  Right lower lobe fractures possibly subacute patient with multiple falls.  She has no symptoms or any pain.  She is not hypoxic.  Continue incentive spirometry.  Dementia: Continue Namenda.  DVT prophylaxis: Lovenox    Code Status: DNR  Family Communication:  None present at bedside.  [name] from: SNF Disposition Plan: SNF Barriers to discharge: Placement   Status is: Inpatient  Remains inpatient appropriate because:Inpatient level of care appropriate due to severity of illness   Dispo: The patient is from: SNF              Anticipated d/c is to: SNF              Anticipated d/c date is:1 day              Patient currently is medically stable to DC         Estimated body mass index is 22.14 kg/m as calculated from the following:   Height as of this encounter: 5\' 3"  (1.6 m).   Weight as of this encounter: 56.7 kg.  Pressure Ulcer 01/13/15 Stage I -  Intact skin with non-blanchable redness of a localized area usually over a bony prominence. (Active)  01/13/15 2130  Location: Buttocks  Location Orientation: Right;Left  Staging: Stage I -  Intact skin with non-blanchable redness of a localized area usually over a bony prominence.  Wound Description (Comments):   Present on Admission: Yes     Nutritional status:  Nutrition Problem: Increased nutrient needs Etiology: hip fracture   Signs/Symptoms: estimated needs   Interventions: Refer to RD note for  recommendations    Consultants:   Orthopedics  Cardiology  Procedures:   Left hip ORIF with intramedullary implant 07/27/2019  Antimicrobials:  Anti-infectives (From admission, onward)   Start     Dose/Rate Route Frequency Ordered Stop   07/27/19 1900  ceFAZolin (ANCEF) IVPB 2g/100 mL premix     2 g 200 mL/hr over 30 Minutes Intravenous Every 6 hours 07/27/19 1543 07/28/19 0641   07/27/19 1315  ceFAZolin (ANCEF) IVPB 2g/100 mL premix     2 g 200 mL/hr over 30 Minutes Intravenous To [facility] 07/26/19 2334 07/27/19 1310         Subjective: Patient seen and examined.  She has no complaints.  Objective: Vitals:   07/29/19 1256 07/29/19 1931 07/30/19 0409 07/30/19 0808  BP: (!) 115/51 (!) 152/81 (!) 123/47 (!) 125/49  Pulse: 70 73 72 69  Resp: 18 14 14 16   Temp: 98 F (36.7 C) 98.6 F (37 C) 98.3 F (36.8 C) 98.2 F (36.8 C)  TempSrc:   Oral Oral  SpO2: 94% 98% 96% 97%  Weight:      Height:        Intake/Output Summary (Last 24 hours) at 07/30/2019 1344 Last data filed at 07/30/2019 0900 Gross per 24 hour  Intake 100 ml  Output 1200 ml  Net -1100 ml   Filed Weights   07/27/19 1237  Weight: 56.7 kg    Examination:  General exam: Appears calm and comfortable  Respiratory system: Clear to auscultation. Respiratory effort normal. Cardiovascular system: S1 & S2 heard, RRR. No JVD, murmurs, rubs, gallops or clicks. No pedal edema. Gastrointestinal system: Abdomen is nondistended, soft and nontender. No organomegaly or masses felt. Normal bowel sounds heard. Central nervous system: Alert and oriented x2. No focal neurological deficits. Extremities: Symmetric 5 x 5 power. Skin: No rashes, lesions or ulcers.  Psychiatry: Judgement and insight appear poor. Mood & affect flat  Data Reviewed: I have personally reviewed following labs and imaging studies  CBC: Recent Labs  Lab 07/26/19 1900 07/27/19 0600 07/28/19 0947 07/29/19 0348 07/30/19 0316   WBC 13.5* 16.5* 22.2* 19.1* 11.7*  NEUTROABS 11.1*  --  20.3* 15.1* 7.7  HGB 15.4* 14.5 12.1 12.8 11.8*  HCT 49.2* 45.2 38.1 40.7 36.9  MCV 89.8 86.6 87.8 88.7 88.7  PLT 231 237 200 227 540   Basic Metabolic Panel: Recent Labs  Lab 07/26/19 1900 07/27/19 0600 07/28/19 0947 07/29/19 0348  NA 139 137 135 136  K 3.5 3.3* 3.8 3.9  CL 102 102 99 101  CO2 25 26 24 28   GLUCOSE 145* 155* 271* 108*  BUN 14 12 14 16   CREATININE 0.90 0.88 1.09* 0.91  CALCIUM 9.4 9.6 9.3 9.4   GFR: Estimated Creatinine Clearance: 32 mL/min (by C-G formula based on SCr of 0.91 mg/dL). Liver Function Tests: Recent Labs  Lab 07/26/19 1900  AST 19  ALT 14  ALKPHOS 69  BILITOT 1.0  PROT 7.0  ALBUMIN 3.9   No results for input(s): LIPASE, AMYLASE in the last 168 hours. No results for input(s): AMMONIA in the last 168 hours. Coagulation Profile:  No results for input(s): INR, PROTIME in the last 168 hours. Cardiac Enzymes: No results for input(s): CKTOTAL, CKMB, CKMBINDEX, TROPONINI in the last 168 hours. BNP (last 3 results) No results for input(s): PROBNP in the last 8760 hours. HbA1C: No results for input(s): HGBA1C in the last 72 hours. CBG: No results for input(s): GLUCAP in the last 168 hours. Lipid Profile: No results for input(s): CHOL, HDL, LDLCALC, TRIG, CHOLHDL, LDLDIRECT in the last 72 hours. Thyroid Function Tests: No results for input(s): TSH, T4TOTAL, FREET4, T3FREE, THYROIDAB in the last 72 hours. Anemia Panel: No results for input(s): VITAMINB12, FOLATE, FERRITIN, TIBC, IRON, RETICCTPCT in the last 72 hours. Sepsis Labs: No results for input(s): PROCALCITON, LATICACIDVEN in the last 168 hours.  Recent Results (from the past 240 hour(s))  SARS Coronavirus 2 by RT PCR (hospital order, performed in [facility] hospital lab) Nasopharyngeal Nasopharyngeal Swab     Status: None   Collection Time: 07/26/19  7:00 PM   Specimen: Nasopharyngeal Swab  Result Value Ref Range Status    SARS Coronavirus 2 NEGATIVE NEGATIVE Final    Comment: (NOTE) SARS-CoV-2 target nucleic acids are NOT DETECTED. The SARS-CoV-2 RNA is generally detectable in upper and lower respiratory specimens during the acute phase of infection. The lowest concentration of SARS-CoV-2 viral copies this assay can detect is 250 copies / mL. A negative result does not preclude SARS-CoV-2 infection and should not be used as the sole basis for treatment or other patient management decisions.  A negative result may occur with improper specimen collection / handling, submission of specimen other than nasopharyngeal swab, presence of viral mutation(s) within the areas targeted by this assay, and inadequate number of viral copies (<250 copies / mL). A negative result must be combined with clinical observations, patient history, and epidemiological information. Fact Sheet for Patients:   StrictlyIdeas.no Fact Sheet for Healthcare Providers: BankingDealers.co.za This test is not yet approved or cleared  by the [place] FDA and has been authorized for detection and/or diagnosis of SARS-CoV-2 by FDA under an Emergency Use Authorization (EUA).  This EUA will remain in effect (meaning this test can be used) for the duration of the COVID-19 declaration under Section 564(b)(1) of the Act, 21 U.S.C. section 360bbb-3(b)(1), unless the authorization is terminated or revoked sooner. Performed at [facility], [place] [address] [place], [place] [address]   Surgical pcr screen     Status: None   Collection Time: 07/27/19 12:28 AM   Specimen: Nasal Mucosa; Nasal Swab  Result Value Ref Range Status   MRSA, PCR NEGATIVE NEGATIVE Final   Staphylococcus aureus NEGATIVE NEGATIVE Final    Comment: (NOTE) The Xpert SA Assay (FDA approved for NASAL specimens in patients 48 years of age and older), is one component of a comprehensive surveillance program. It  is not intended to diagnose infection nor to guide or monitor treatment. Performed at [facility], [place] [address], [place],  [address]       Radiology Studies: No results found.  Scheduled Meds: . docusate sodium  100 mg Oral BID  . enoxaparin (LOVENOX) injection  30 mg Subcutaneous Q24H  . feeding supplement (ENSURE ENLIVE)  237 mL Oral BID BM  . memantine  10 mg Oral Daily  . metoprolol tartrate  12.5 mg Oral BID  . multivitamin with minerals  1 tablet Oral Daily   Continuous Infusions: . sodium chloride 75 mL/hr at 07/27/19 1557  . methocarbamol (ROBAXIN) IV  LOS: 4 days   Time spent: 25 minutes   Darliss Cheney, MD [facility]  07/30/2019, 1:44 PM   To contact the attending provider between 7A-7P or the covering provider during after hours 7P-7A, please log into the web site [email or web address].

## 2019-07-30 NOTE — Progress Notes (Signed)
Physical Therapy Treatment Patient Details Name: Cheryl Zamora MRN: [id number] DOB: [birth year] Today's Date: 07/30/2019    History of Present Illness Pt is a 86 yo f s/p L hip IM nail secondary to fracture from a fall at a nursing home. PMH significant for but not limited to: CABG, a-fib, CHF,OA, back surgery, pacemaker in 2018, dementia    PT Comments    Pt progressing with mobility. Continue to recommend SNF for ongoing Physical Therapy.     Follow Up Recommendations  SNF;Supervision/Assistance - 24 hour     Equipment Recommendations  None recommended by PT    Recommendations for Other Services       Precautions / Restrictions Precautions Precautions: Fall Restrictions Weight Bearing Restrictions: Yes LLE Weight Bearing: Weight bearing as tolerated    Mobility  Bed Mobility Overal bed mobility: Needs Assistance Bed Mobility: Supine to Sit     Supine to sit: Mod assist;+2 for physical assistance     General bed mobility comments: pt requires increased time. Moved better today  Transfers Overall transfer level: Needs assistance   Transfers: Sit to/from Stand Sit to Stand: Mod assist;+2 safety/equipment         General transfer comment: Pt performed sit to stand well holding to bar on [place]. May be safe to try with walker next session  Ambulation/Gait Ambulation/Gait assistance: (Did not attempt today)               Stairs             Wheelchair Mobility    Modified Rankin (Stroke Patients Only)       Balance Overall balance assessment: History of Falls                                          Cognition Arousal/Alertness: Awake/alert Behavior During Therapy: WFL for tasks assessed/performed Overall Cognitive Status: No family/caregiver present to determine baseline cognitive functioning                                 General Comments: Pt cooperative and follows 1 step commands when she can hear  them      Exercises Total Joint Exercises Ankle Circles/Pumps: AROM;Both;5 reps;Supine Short Arc Quad: AROM;Left;5 reps;Supine Heel Slides: AAROM;Left;10 reps;Supine Hip ABduction/ADduction: AAROM;Left;Supine;10 reps    General Comments General comments (skin integrity, edema, etc.): No family present. Pt cooperative throughtout session. and pleasant.      Pertinent Vitals/Pain Pain Assessment: Faces Faces Pain Scale: Hurts little more Pain Location: left leg Pain Intervention(s): Limited activity within patient's tolerance;Monitored during session    Home Living                      Prior Function            PT Goals (current goals can now be found in the care plan section) Progress towards PT goals: Progressing toward goals    Frequency    Min 3X/week      PT Plan Current plan remains appropriate    Co-evaluation              AM-PAC PT "6 Clicks" Mobility   Outcome Measure  Help needed turning from your back to your side while in a flat bed without using bedrails?: A Lot Help needed moving from lying  on your back to sitting on the side of a flat bed without using bedrails?: A Lot Help needed moving to and from a bed to a chair (including a wheelchair)?: A Lot Help needed standing up from a chair using your arms (e.g., wheelchair or bedside chair)?: A Lot Help needed to walk in hospital room?: Total Help needed climbing 3-5 steps with a railing? : Total 6 Click Score: 10    End of Session Equipment Utilized During Treatment: Gait belt Activity Tolerance: Patient tolerated treatment well Patient left: in chair;with chair alarm set;with call bell/phone within reach Nurse Communication: Mobility status;Other (comment)(Purewick had been on pt but not turned on. not on pt now) PT Visit Diagnosis: History of falling (Z91.81)     Time: 0940-7680 PT Time Calculation (min) (ACUTE ONLY): 23 min  Charges:  $Gait Training: 8-22 mins $Therapeutic  Exercise: 8-22 mins                     Cheryl Zamora, PT   Acute Rehabilitation Services  Pager [phone] Office [phone] 07/30/2019    Cheryl Zamora 07/30/2019, 10:35 AM

## 2019-07-31 LAB — CBC WITH DIFFERENTIAL/PLATELET
Abs Immature Granulocytes: 0.04 10*3/uL (ref 0.00–0.07)
Basophils Absolute: 0.1 10*3/uL (ref 0.0–0.1)
Basophils Relative: 1 %
Eosinophils Absolute: 0.3 10*3/uL (ref 0.0–0.5)
Eosinophils Relative: 3 %
HCT: 39.2 % (ref 36.0–46.0)
Hemoglobin: 12.4 g/dL (ref 12.0–15.0)
Immature Granulocytes: 0 %
Lymphocytes Relative: 17 %
Lymphs Abs: 1.8 10*3/uL (ref 0.7–4.0)
MCH: 28.1 pg (ref 26.0–34.0)
MCHC: 31.6 g/dL (ref 30.0–36.0)
MCV: 88.7 fL (ref 80.0–100.0)
Monocytes Absolute: 0.8 10*3/uL (ref 0.1–1.0)
Monocytes Relative: 8 %
Neutro Abs: 7 10*3/uL (ref 1.7–7.7)
Neutrophils Relative %: 71 %
Platelets: 216 10*3/uL (ref 150–400)
RBC: 4.42 MIL/uL (ref 3.87–5.11)
RDW: 14.7 % (ref 11.5–15.5)
WBC: 10.1 10*3/uL (ref 4.0–10.5)
nRBC: 0 % (ref 0.0–0.2)

## 2019-07-31 MED ORDER — ONDANSETRON HCL 4 MG/2ML IJ SOLN: 4.0000 mg | Freq: Four times a day (QID) | INTRAMUSCULAR | Status: DC | PRN

## 2019-07-31 NOTE — Progress Notes (Signed)
Physical Therapy Treatment Patient Details Name: Cheryl Zamora MRN: [id number] DOB: Jan 19, 1924 Today's Date: 07/31/2019    History of Present Illness Pt is a 86 yo f s/p L hip IM nail secondary to fracture from a fall at a nursing home. PMH significant for but not limited to: CABG, a-fib, CHF,OA, back surgery, pacemaker in 2018, dementia    PT Comments    Pt remains greatly limited with functional mobility overall secondary to pain, weakness and cognitive deficits. She continues to require heavy two person physical assistance for all mobility. Pt incontinent of stool and lying in a large, liquid BM upon arrival. Total A required for pericare. Pt would continue to benefit from skilled physical therapy services at this time while admitted and after d/c to address the below listed limitations in order to improve overall safety and independence with functional mobility.    Follow Up Recommendations  SNF;Supervision/Assistance - 24 hour     Equipment Recommendations  None recommended by PT    Recommendations for Other Services       Precautions / Restrictions Precautions Precautions: Fall Restrictions Weight Bearing Restrictions: Yes LLE Weight Bearing: Weight bearing as tolerated    Mobility  Bed Mobility Overal bed mobility: Needs Assistance Bed Mobility: Supine to Sit;Sit to Supine;Rolling Rolling: Mod assist;+2 for physical assistance   Supine to sit: Mod assist;+2 for physical assistance Sit to supine: Max assist;+2 for physical assistance   General bed mobility comments: rolled bilaterally multiple times for pericare as pt lying in a large, liquid BM upon arrival; she required multimodal cueing to complete tasks  Transfers                 General transfer comment: attempted several times to perform a lateral scoot transfer; however, despite max multimodal cueing, pt unable to comprehend and with heavy posterior lean - unsafe to continue for therapists and pt. Would  recommend lift equipment  Ambulation/Gait                 Stairs             Wheelchair Mobility    Modified Rankin (Stroke Patients Only)       Balance Overall balance assessment: History of Falls;Needs assistance Sitting-balance support: Feet supported Sitting balance-Leahy Scale: Poor   Postural control: Posterior lean                                  Cognition Arousal/Alertness: Awake/alert Behavior During Therapy: WFL for tasks assessed/performed Overall Cognitive Status: Impaired/Different from baseline Area of Impairment: Orientation;Memory;Following commands;Problem solving                 Orientation Level: Disoriented to;Time;Situation   Memory: Decreased short-term memory;Decreased recall of precautions Following Commands: Follows one step commands inconsistently;Follows one step commands with increased time     Problem Solving: Slow processing;Decreased initiation;Difficulty sequencing;Requires tactile cues;Requires verbal cues        Exercises      General Comments        Pertinent Vitals/Pain Pain Assessment: Faces Faces Pain Scale: Hurts even more Pain Location: L hip Pain Descriptors / Indicators: Grimacing;Guarding Pain Intervention(s): Monitored during session;Repositioned    Home Living                      Prior Function            PT Goals (current goals  can now be found in the care plan section) Acute Rehab PT Goals PT Goal Formulation: Patient unable to participate in goal setting Time For Goal Achievement: 08/04/19 Potential to Achieve Goals: Fair Progress towards PT goals: Progressing toward goals    Frequency    Min 3X/week      PT Plan Current plan remains appropriate    Co-evaluation              AM-PAC PT "6 Clicks" Mobility   Outcome Measure  Help needed turning from your back to your side while in a flat bed without using bedrails?: A Lot Help needed moving  from lying on your back to sitting on the side of a flat bed without using bedrails?: A Lot Help needed moving to and from a bed to a chair (including a wheelchair)?: Total Help needed standing up from a chair using your arms (e.g., wheelchair or bedside chair)?: Total Help needed to walk in hospital room?: Total Help needed climbing 3-5 steps with a railing? : Total 6 Click Score: 8    End of Session Equipment Utilized During Treatment: Gait belt Activity Tolerance: Patient tolerated treatment well Patient left: in bed;with call bell/phone within reach;with bed alarm set Nurse Communication: Mobility status;Need for lift equipment PT Visit Diagnosis: History of falling (Z91.81)     Time: 5573-2202 PT Time Calculation (min) (ACUTE ONLY): 25 min  Charges:  $Therapeutic Activity: 23-37 mins                     Anastasio Champion, DPT  Acute Rehabilitation Services Pager [phone] Office [place] 07/31/2019, 3:41 PM

## 2019-07-31 NOTE — Progress Notes (Signed)
PROGRESS NOTE    Cheryl Zamora  NOM:[id number] DOB: October 03, 1923 DOA: 07/26/2019 PCP: Welford Roche, NP   Brief Narrative:  86 year old female with past medical history of dementia, A. fib not on any anticoagulation, history of CAD status post CABG, hypertension, CKD stage IIIa, sick sinus syndrome with pacemaker, chronic diastolic CHF presented to ED with unwitnessed fall and subsequently was diagnosed with acute left intertrochanteric fracture.  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  Chest x-ray showed right lower rib fracture which appears to be subacute.  She was admitted to hospital service and orthopedics were consulted.  Assessment & Plan:   Principal Problem:   Displaced intertrochanteric fracture of left femur, initial encounter for closed fracture (HCC) Active Problems:   PAF (paroxysmal atrial fibrillation) (HCC)   Chronic diastolic CHF (congestive heart failure), NYHA class 3 (HCC)   Essential hypertension   CKD (chronic kidney disease) stage 3, GFR 30-59 ml/min   CAD, multiple vessel, hx CABG 2012   Paroxysmal atrial fibrillation (HCC)   Chronic diastolic (congestive) heart failure (HCC)   Nodule of middle lobe of right lung   Leukocytosis  Closed left hip fracture ([place]) -orthopedics on board.  They consulted cardiology for cardiac clearance and patient was cleared.  [name] status post ORIF with intramedullary implant  07/27/2019.  Management per orthopedics.   Essential hypertension: Fairly controlled.  Continue metoprolol and as needed hydralazine.   CAD, multiple vessel, hx CABG 2012 -chronic stable patient not reporting any chest pain or shortness of breath.  Seen and cleared by cardiology.    PAF (paroxysmal atrial fibrillation) (HCC)-not on anticoagulation given risk of  falls as well as hematuria.  Continue metoprolol blood pressure well.  Currently in sinus rhythm.    Chronic diastolic CHF (congestive heart failure), NYHA class 3 (HCC) -currently appears to be euvolemic avoid over aggressive fluids Appreciate cardiology input regarding perioperative cardiac monitoring and management    CKD (chronic kidney disease) stage 3a: stable continue to monitor   Leukocytosis -it is chronic.  No signs of infection.  Monitor.  Hypokalemia: Resolved.  Right lower lobe fractures possibly subacute patient with multiple falls.  She has no symptoms or any pain.  She is not hypoxic.  Continue incentive spirometry.  Dementia: Continue Namenda.  DVT prophylaxis: Lovenox    Code Status: DNR  Family Communication:  None present at bedside.  [name] from: SNF Disposition Plan: SNF Barriers to discharge: Placement   Status is: Inpatient  Remains inpatient appropriate because:Inpatient level of care appropriate due to severity of illness   Dispo: The patient is from: SNF              Anticipated d/c is to: SNF              Anticipated d/c date is: 1 to 2 days              Patient currently is medically stable to DC         Estimated body mass index is 22.14 kg/m as calculated from the following:   Height as of this encounter: 5\' 3"  (1.6 m).   Weight as of this encounter: 56.7 kg.  Pressure Ulcer 01/13/15 Stage I -  Intact skin with non-blanchable redness of a localized area usually over a bony prominence. (Active)  01/13/15 0839  Location: Buttocks  Location Orientation: Right;Left  Staging: Stage I -  Intact skin with non-blanchable redness of a localized area usually over a bony prominence.  Wound Description (Comments):   Present on Admission: Yes     Nutritional status:  Nutrition Problem: Increased nutrient needs Etiology: hip fracture   Signs/Symptoms: estimated needs   Interventions: Refer to RD note for  recommendations    Consultants:   Orthopedics  Cardiology  Procedures:   Left hip ORIF with intramedullary implant 07/27/2019  Antimicrobials:  Anti-infectives (From admission, onward)   Start     Dose/Rate Route Frequency Ordered Stop   07/27/19 1900  ceFAZolin (ANCEF) IVPB 2g/100 mL premix     2 g 200 mL/hr over 30 Minutes Intravenous Every 6 hours 07/27/19 1543 07/28/19 0641   07/27/19 1315  ceFAZolin (ANCEF) IVPB 2g/100 mL premix     2 g 200 mL/hr over 30 Minutes Intravenous To [facility] 07/26/19 2334 07/27/19 1310         Subjective: Patient seen and examined.  Sitting at the bed eating breakfast.  No complaints.  Objective: Vitals:   07/30/19 0808 07/30/19 2008 07/31/19 0452 07/31/19 0814  BP: (!) 125/49 125/69 123/83 (!) 146/57  Pulse: 69 68 70 70  Resp: 16 16 16 17   Temp: 98.2 F (36.8 C) 98.5 F (36.9 C) 98.2 F (36.8 C) 97.7 F (36.5 C)  TempSrc: Oral Oral  Oral  SpO2: 97% 98% 98% 98%  Weight:      Height:        Intake/Output Summary (Last 24 hours) at 07/31/2019 1121 Last data filed at 07/31/2019 0452 Gross per 24 hour  Intake 0 ml  Output 500 ml  Net -500 ml   Filed Weights   07/27/19 1237  Weight: 56.7 kg    Examination:  General exam: Appears calm and comfortable  Respiratory system: Clear to auscultation. Respiratory effort normal. Cardiovascular system: S1 & S2 heard, RRR. No JVD, murmurs, rubs, gallops or clicks. No pedal edema. Gastrointestinal system: Abdomen is nondistended, soft and nontender. No organomegaly or masses felt. Normal bowel sounds heard. Central nervous system: Alert and oriented x2. No focal neurological deficits. Extremities: Symmetric 5 x 5 power. Skin: No rashes, lesions or ulcers.  Psychiatry: Judgement and insight appear poor. Mood & affect flat   Data Reviewed: I have personally reviewed following labs and imaging studies  CBC: Recent Labs  Lab 07/26/19 1900 07/26/19 1900 07/27/19 0600  07/28/19 0947 07/29/19 0348 07/30/19 0316 07/31/19 0414  WBC 13.5*   < > 16.5* 22.2* 19.1* 11.7* 10.1  NEUTROABS 11.1*  --   --  20.3* 15.1* 7.7 7.0  HGB 15.4*   < > 14.5 12.1 12.8 11.8* 12.4  HCT 49.2*   < > 45.2 38.1 40.7 36.9 39.2  MCV 89.8   < > 86.6 87.8 88.7 88.7 88.7  PLT 231   < > 237 200 227 214 216   < > = values in this interval not displayed.   Basic Metabolic Panel: Recent Labs  Lab 07/26/19 1900 07/27/19 0600 07/28/19 0947 07/29/19 0348  NA 139 137 135 136  K 3.5 3.3* 3.8 3.9  CL 102 102 99 101  CO2 25 26 24 28   GLUCOSE 145* 155* 271* 108*  BUN 14 12 14 16   CREATININE 0.90 0.88 1.09* 0.91  CALCIUM 9.4 9.6 9.3 9.4   GFR: Estimated Creatinine Clearance: 32 mL/min (by C-G formula based on SCr of 0.91 mg/dL). Liver  Function Tests: Recent Labs  Lab 07/26/19 1900  AST 19  ALT 14  ALKPHOS 69  BILITOT 1.0  PROT 7.0  ALBUMIN 3.9   No results for input(s): LIPASE, AMYLASE in the last 168 hours. No results for input(s): AMMONIA in the last 168 hours. Coagulation Profile: No results for input(s): INR, PROTIME in the last 168 hours. Cardiac Enzymes: No results for input(s): CKTOTAL, CKMB, CKMBINDEX, TROPONINI in the last 168 hours. BNP (last 3 results) No results for input(s): PROBNP in the last 8760 hours. HbA1C: No results for input(s): HGBA1C in the last 72 hours. CBG: No results for input(s): GLUCAP in the last 168 hours. Lipid Profile: No results for input(s): CHOL, HDL, LDLCALC, TRIG, CHOLHDL, LDLDIRECT in the last 72 hours. Thyroid Function Tests: No results for input(s): TSH, T4TOTAL, FREET4, T3FREE, THYROIDAB in the last 72 hours. Anemia Panel: No results for input(s): VITAMINB12, FOLATE, FERRITIN, TIBC, IRON, RETICCTPCT in the last 72 hours. Sepsis Labs: No results for input(s): PROCALCITON, LATICACIDVEN in the last 168 hours.  Recent Results (from the past 240 hour(s))  SARS Coronavirus 2 by RT PCR (hospital order, performed in [facility]  hospital lab) Nasopharyngeal Nasopharyngeal Swab     Status: None   Collection Time: 07/26/19  7:00 PM   Specimen: Nasopharyngeal Swab  Result Value Ref Range Status   SARS Coronavirus 2 NEGATIVE NEGATIVE Final    Comment: (NOTE) SARS-CoV-2 target nucleic acids are NOT DETECTED. The SARS-CoV-2 RNA is generally detectable in upper and lower respiratory specimens during the acute phase of infection. The lowest concentration of SARS-CoV-2 viral copies this assay can detect is 250 copies / mL. A negative result does not preclude SARS-CoV-2 infection and should not be used as the sole basis for treatment or other patient management decisions.  A negative result may occur with improper specimen collection / handling, submission of specimen other than nasopharyngeal swab, presence of viral mutation(s) within the areas targeted by this assay, and inadequate number of viral copies (<250 copies / mL). A negative result must be combined with clinical observations, patient history, and epidemiological information. Fact Sheet for Patients:   StrictlyIdeas.no Fact Sheet for Healthcare Providers: BankingDealers.co.za This test is not yet approved or cleared  by the [place] FDA and has been authorized for detection and/or diagnosis of SARS-CoV-2 by FDA under an Emergency Use Authorization (EUA).  This EUA will remain in effect (meaning this test can be used) for the duration of the COVID-19 declaration under Section 564(b)(1) of the Act, 21 U.S.C. section 360bbb-3(b)(1), unless the authorization is terminated or revoked sooner. Performed at [facility], [place] [address] [place], [place] [address]   Surgical pcr screen     Status: None   Collection Time: 07/27/19 12:28 AM   Specimen: Nasal Mucosa; Nasal Swab  Result Value Ref Range Status   MRSA, PCR NEGATIVE NEGATIVE Final   Staphylococcus aureus NEGATIVE NEGATIVE Final     Comment: (NOTE) The Xpert SA Assay (FDA approved for NASAL specimens in patients 46 years of age and older), is one component of a comprehensive surveillance program. It is not intended to diagnose infection nor to guide or monitor treatment. Performed at [facility], [place] [address], [place], [place] [address]   SARS Coronavirus 2 by RT PCR (hospital order, performed in [facility] hospital lab) Nasopharyngeal Nasopharyngeal Swab     Status: None   Collection Time: 07/30/19  4:05 PM   Specimen: Nasopharyngeal Swab  Result Value  Ref Range Status   SARS Coronavirus 2 NEGATIVE NEGATIVE Final    Comment: (NOTE) SARS-CoV-2 target nucleic acids are NOT DETECTED. The SARS-CoV-2 RNA is generally detectable in upper and lower respiratory specimens during the acute phase of infection. The lowest concentration of SARS-CoV-2 viral copies this assay can detect is 250 copies / mL. A negative result does not preclude SARS-CoV-2 infection and should not be used as the sole basis for treatment or other patient management decisions.  A negative result may occur with improper specimen collection / handling, submission of specimen other than nasopharyngeal swab, presence of viral mutation(s) within the areas targeted by this assay, and inadequate number of viral copies (<250 copies / mL). A negative result must be combined with clinical observations, patient history, and epidemiological information. Fact Sheet for Patients:   StrictlyIdeas.no Fact Sheet for Healthcare Providers: BankingDealers.co.za This test is not yet approved or cleared  by the [place] FDA and has been authorized for detection and/or diagnosis of SARS-CoV-2 by FDA under an Emergency Use Authorization (EUA).  This EUA will remain in effect (meaning this test can be used) for the duration of the COVID-19 declaration under Section 564(b)(1) of the Act, 21 U.S.C. section  360bbb-3(b)(1), unless the authorization is terminated or revoked sooner. Performed at [facility], [place] [address] [place], [place] [address]       Radiology Studies: No results found.  Scheduled Meds:  docusate sodium  100 mg Oral BID   enoxaparin (LOVENOX) injection  30 mg Subcutaneous Q24H   feeding supplement (ENSURE ENLIVE)  237 mL Oral BID BM   memantine  10 mg Oral Daily   metoprolol tartrate  12.5 mg Oral BID   multivitamin with minerals  1 tablet Oral Daily   Continuous Infusions:  sodium chloride 75 mL/hr at 07/27/19 1557   methocarbamol (ROBAXIN) IV       LOS: 5 days   Time spent: 26 minutes   Darliss Cheney, MD [facility]  07/31/2019, 11:21 AM   To contact the attending provider between 7A-7P or the covering provider during after hours 7P-7A, please log into the web site [email or web address].

## 2019-08-01 LAB — CBC WITH DIFFERENTIAL/PLATELET
Abs Immature Granulocytes: 0.08 10*3/uL — ABNORMAL HIGH (ref 0.00–0.07)
Basophils Absolute: 0.1 10*3/uL (ref 0.0–0.1)
Basophils Relative: 1 %
Eosinophils Absolute: 0.3 10*3/uL (ref 0.0–0.5)
Eosinophils Relative: 2 %
HCT: 43 % (ref 36.0–46.0)
Hemoglobin: 13.8 g/dL (ref 12.0–15.0)
Immature Granulocytes: 1 %
Lymphocytes Relative: 13 %
Lymphs Abs: 1.7 10*3/uL (ref 0.7–4.0)
MCH: 28.2 pg (ref 26.0–34.0)
MCHC: 32.1 g/dL (ref 30.0–36.0)
MCV: 87.8 fL (ref 80.0–100.0)
Monocytes Absolute: 1.1 10*3/uL — ABNORMAL HIGH (ref 0.1–1.0)
Monocytes Relative: 9 %
Neutro Abs: 9.8 10*3/uL — ABNORMAL HIGH (ref 1.7–7.7)
Neutrophils Relative %: 74 %
Platelets: 234 10*3/uL (ref 150–400)
RBC: 4.9 MIL/uL (ref 3.87–5.11)
RDW: 14.7 % (ref 11.5–15.5)
WBC: 13.1 10*3/uL — ABNORMAL HIGH (ref 4.0–10.5)
nRBC: 0 % (ref 0.0–0.2)

## 2019-08-01 NOTE — TOC CAGE-AID Note (Signed)
Transition of Care [facility]) - CAGE-AID Screening   Patient Details  Name: Cheryl Zamora MRN: [id number] Date of Birth: 20-May-1923  Transition of Care [facility]) CM/SW Contact:    Emeterio Reeve, [place] Phone Number: 08/01/2019, 3:10 PM   Clinical Narrative:  Pt was unable to complete assessment due to pt not being oriented.   CAGE-AID Screening: Substance Abuse Screening unable to be completed due to: : Patient unable to participate                  [place] Clinical Social Worker [phone]

## 2019-08-01 NOTE — Progress Notes (Signed)
Nutrition Follow-up  RD working remotely.  DOCUMENTATION CODES:   Not applicable  INTERVENTION:   -Continue MVI with minerals daily -Continue Ensure Enlive po BID, each supplement provides 350 kcal and 20 grams of protein  NUTRITION DIAGNOSIS:   Increased nutrient needs related to hip fracture as evidenced by estimated needs.  Ongoing  GOAL:   Patient will meet greater than or equal to 90% of their needs  Progressing   MONITOR:   PO intake, Supplement acceptance, Skin  REASON FOR ASSESSMENT:   Malnutrition Screening Tool    ASSESSMENT:   86 yo female admitted with L hip fracture s/p fall at SNF. PMH includes HLD, HTN, CAD, neuropathy, A fib, CHF GERD.  5/28- s/p PROCEDURE: Open treatment of intertrochanteric fracture with intramedullary implant. CPT [phone]   Reviewed I/O's: +240 ml x 24 hours and -3.7 L since admission  Attempted to speak with pt via phone, however, no answer.   Pt with good appetite. Noted meal completion 50-90%. Pt is also consuming Ensure supplements per MAR.   Per MD notes, pt is stable for discharge. TOC team working on SNF placement.   Labs reviewed.   Diet Order:   Diet Order            Diet Heart Room service appropriate? No; Fluid consistency: Thin  Diet effective now              EDUCATION NEEDS:   No education needs have been identified at this time  Skin:  Skin Assessment: Skin Integrity Issues: Skin Integrity Issues:: Incisions Incisions: closed lt hip  Last BM:  07/31/19  Height:   Ht Readings from Last 1 Encounters:  07/27/19 5\' 3"  (1.6 m)    Weight:   Wt Readings from Last 1 Encounters:  07/27/19 56.7 kg   BMI:  Body mass index is 22.14 kg/m.  Estimated Nutritional Needs:   Kcal:  1400-1600  Protein:  80-95 gm  Fluid:  >/= 1.5 L    Loistine Chance, RD, LDN, [place] Registered Dietitian II Certified Diabetes Care and Education Specialist Please refer to [facility] for RD and/or RD on-call/weekend/after  hours pager

## 2019-08-01 NOTE — TOC Transition Note (Signed)
Transition of Care [facility]) - CM/SW Discharge Note   Patient Details  Name: LYNCOLN MASKELL MRN: [id number] Date of Birth: 1923/06/18  Transition of Care [facility]) CM/SW Contact:  Curlene Labrum, RN Phone Number: 08/01/2019, 12:38 PM   Clinical Narrative:     Case Management called [facility] on 07/31/2019 regarding patient's return to the ALF.  Patient will be unable to return since the facility does not provide skilled nursing and therapy for the patient.  I called the son and offered Medicare choice regarding skilled nursing placement.  The son prefers [facility] and I contacted the facility to confirm bed availability.  I spoke with Claud Kelp, admissions director for [place] called [facility] and faxed the appropriate clinicals to [facility] for insurance auth approval      Barriers to Discharge: Continued Medical Work up   Patient Goals and CMS Choice Patient states their goals for this hospitalization and ongoing recovery are:: Pt was not able to verbalize goals. CMS Medicare.gov Compare Post Acute Care list provided to:: Patient Choice offered to / list presented to : Patient  Discharge Placement                       Discharge Plan and Services                                     Social Determinants of Health (SDOH) Interventions     Readmission Risk Interventions No flowsheet data found.

## 2019-08-01 NOTE — Progress Notes (Signed)
Progress Note    Cheryl Zamora  CHY:[id number] DOB: June 23, 1923  DOA: 07/26/2019 PCP: Welford Roche, NP    Brief Narrative:    Medical records reviewed and are as summarized below:  Cheryl Zamora is an 86 y.o. female with a past medical history that includes A. fib not on any anticoagulation, dementia, hypertension, chronic kidney disease stage III, CAD status post CABG, sick sinus syndrome with pacemaker, chronic diastolic heart failure admitted May 27 with acute left intertrochanteric fracture as a result of an unwitnessed fall.  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  Chest x-ray revealed right lower rib fracture which appeared to be subacute.  Evaluated by orthopedics and underwent surgery May 28.  Evaluated by PT postoperatively and SNF recommended.  Currently awaiting placement  Assessment/Plan:   Principal Problem:   Displaced intertrochanteric fracture of left femur, initial encounter for closed fracture (HCC) Active Problems:   PAF (paroxysmal atrial fibrillation) (HCC)   Essential hypertension   CKD (chronic kidney disease) stage 3, GFR 30-59 ml/min   CAD, multiple vessel, hx CABG 2012   Chronic diastolic (congestive) heart failure (HCC)   Nodule of middle lobe of right lung  #1.  Left hip fracture.  Underwent surgical repair May 28.  Prior to surgery cardiology consulted for clearance and patient was cleared.  She is status post ORIF with intramedullary implant.  Pain managed.  Evaluated by physical therapy who recommended SNF and currently awaiting placement.  Chart review indicates postoperative plan for orthopedics was to be weightbearing as tolerated and follow-up in 2 weeks from discharge for staple removal.   -Further management per Ortho, of note  orthopedics cleared patient for discharge May 30.  #2.  Hypertension.  Fair control.  Blood pressure will be on the high end of normal on occasion.  Home medications include a beta-blocker.  Denies pain and has not taken any pain medicine for 2 days. -Continue metoprolol -As needed hydralazine -Monitor  #3.  CAD, multiple vessel.  History of CABG 2012.  She was evaluated by cardiology preoperatively and cleared for surgery.  No chest pain. -Continue home meds  #4.  Paroxysmal atrial fibrillation.  Not on anticoagulation given her risk of falls.  Home medications include a beta-blocker.  EKG reveals sinus rhythm -Continue home meds  #5.  Chronic diastolic heart failure.  Compensated.  Home medications include metoprolol.  Has had IV fluids since admission.  Blood pressure trending up slightly.  She was feeding herself breakfast this morning without problems. -Stop IV fluids -Intake and output  #6.  Chronic kidney disease.  Stage III.  Stable at baseline.  7.  Hypokalemia.  Resolved.  8.  Rib fracture.  Chest x-ray revealed right lower rib fractures that appear subacute.  #9.  Dementia.  Stable at baseline.  Home medications include Namenda   Family Communication/Anticipated D/C date and plan/Code Status   DVT prophylaxis: Lovenox ordered. Code Status: dnr.  Family Communication: soon phone Disposition Plan: Status is: Inpatient  Remains inpatient appropriate because:IV treatments appropriate due to intensity of illness or inability to take PO   Dispo: The patient is from: Home              Anticipated d/c is to: SNF  Anticipated d/c date is: 1 day              Patient currently is medically stable to d/c.          Medical Consultants:    None.   Anti-Infectives:    None  Subjective:   Sitting up in bed eating [place] toast.  Denies pain or discomfort.  Has no recollection of her fall or her broken hip  Objective:    Vitals:   07/31/19 1444  07/31/19 1914 08/01/19 0500 08/01/19 0922  BP: (!) 119/46 (!) 155/140 (!) 112/97 (!) 174/74  Pulse: 71 70 70 70  Resp: 17 17 16 18   Temp: 98.4 F (36.9 C) 97.7 F (36.5 C) 98.7 F (37.1 C) (!) 97.5 F (36.4 C)  TempSrc: Oral Oral Oral Oral  SpO2: 97% 98% 100% 98%  Weight:      Height:        Intake/Output Summary (Last 24 hours) at 08/01/2019 1238 Last data filed at 08/01/2019 0805 Gross per 24 hour  Intake 240 ml  Output --  Net 240 ml   Filed Weights   07/27/19 1237  Weight: 56.7 kg    Exam: General: Somewhat thin and frail-appearing no acute distress Cardiovascular: Regular rate and rhythm no murmur gallop or rub no lower extremity edema Respiratory: No increased work of breathing with conversation or eating.  Breath sounds are clear bilaterally I hear no wheeze no crackles Abdomen: Nondistended soft positive bowel sounds throughout nontender to palpation no mass organomegaly noted Musculoskeletal: Joints without swelling/erythema dressing left hip clean dry and intact.  Pedal pulses present palpable Neuro: Alert oriented to self only follow simple commands short-term memory absent  Data Reviewed:   I have personally reviewed following labs and imaging studies:  Labs: Labs show the following:   Basic Metabolic Panel: Recent Labs  Lab 07/26/19 1900 07/26/19 1900 07/27/19 0600 07/27/19 0600 07/28/19 0947 07/29/19 0348  NA 139  --  137  --  135 136  K 3.5   < > 3.3*   < > 3.8 3.9  CL 102  --  102  --  99 101  CO2 25  --  26  --  24 28  GLUCOSE 145*  --  155*  --  271* 108*  BUN 14  --  12  --  14 16  CREATININE 0.90  --  0.88  --  1.09* 0.91  CALCIUM 9.4  --  9.6  --  9.3 9.4   < > = values in this interval not displayed.   GFR Estimated Creatinine Clearance: 32 mL/min (by C-G formula based on SCr of 0.91 mg/dL). Liver Function Tests: Recent Labs  Lab 07/26/19 1900  AST 19  ALT 14  ALKPHOS 69  BILITOT 1.0  PROT 7.0  ALBUMIN 3.9   No results for  input(s): LIPASE, AMYLASE in the last 168 hours. No results for input(s): AMMONIA in the last 168 hours. Coagulation profile No results for input(s): INR, PROTIME in the last 168 hours.  CBC: Recent Labs  Lab 07/28/19 0947 07/29/19 0348 07/30/19 0316 07/31/19 0414 08/01/19 0311  WBC 22.2* 19.1* 11.7* 10.1 13.1*  NEUTROABS 20.3* 15.1* 7.7 7.0 9.8*  HGB 12.1 12.8 11.8* 12.4 13.8  HCT 38.1 40.7 36.9 39.2 43.0  MCV 87.8 88.7 88.7 88.7 87.8  PLT 200 227 214 216 234   Cardiac Enzymes: No results for input(s): CKTOTAL, CKMB, CKMBINDEX, TROPONINI in the last 168 hours. BNP (last 3  results) No results for input(s): PROBNP in the last 8760 hours. CBG: No results for input(s): GLUCAP in the last 168 hours. D-Dimer: No results for input(s): DDIMER in the last 72 hours. Hgb A1c: No results for input(s): HGBA1C in the last 72 hours. Lipid Profile: No results for input(s): CHOL, HDL, LDLCALC, TRIG, CHOLHDL, LDLDIRECT in the last 72 hours. Thyroid function studies: No results for input(s): TSH, T4TOTAL, T3FREE, THYROIDAB in the last 72 hours.  Invalid input(s): FREET3 Anemia work up: No results for input(s): VITAMINB12, FOLATE, FERRITIN, TIBC, IRON, RETICCTPCT in the last 72 hours. Sepsis Labs: Recent Labs  Lab 07/29/19 0348 07/30/19 0316 07/31/19 0414 08/01/19 0311  WBC 19.1* 11.7* 10.1 13.1*    Microbiology Recent Results (from the past 240 hour(s))  SARS Coronavirus 2 by RT PCR (hospital order, performed in [facility] hospital lab) Nasopharyngeal Nasopharyngeal Swab     Status: None   Collection Time: 07/26/19  7:00 PM   Specimen: Nasopharyngeal Swab  Result Value Ref Range Status   SARS Coronavirus 2 NEGATIVE NEGATIVE Final    Comment: (NOTE) SARS-CoV-2 target nucleic acids are NOT DETECTED. The SARS-CoV-2 RNA is generally detectable in upper and lower respiratory specimens during the acute phase of infection. The lowest concentration of SARS-CoV-2 viral copies this  assay can detect is 250 copies / mL. A negative result does not preclude SARS-CoV-2 infection and should not be used as the sole basis for treatment or other patient management decisions.  A negative result may occur with improper specimen collection / handling, submission of specimen other than nasopharyngeal swab, presence of viral mutation(s) within the areas targeted by this assay, and inadequate number of viral copies (<250 copies / mL). A negative result must be combined with clinical observations, patient history, and epidemiological information. Fact Sheet for Patients:   StrictlyIdeas.no Fact Sheet for Healthcare Providers: BankingDealers.co.za This test is not yet approved or cleared  by the [place] FDA and has been authorized for detection and/or diagnosis of SARS-CoV-2 by FDA under an Emergency Use Authorization (EUA).  This EUA will remain in effect (meaning this test can be used) for the duration of the COVID-19 declaration under Section 564(b)(1) of the Act, 21 U.S.C. section 360bbb-3(b)(1), unless the authorization is terminated or revoked sooner. Performed at [facility], [place] [address] [place], [place] [address]   Surgical pcr screen     Status: None   Collection Time: 07/27/19 12:28 AM   Specimen: Nasal Mucosa; Nasal Swab  Result Value Ref Range Status   MRSA, PCR NEGATIVE NEGATIVE Final   Staphylococcus aureus NEGATIVE NEGATIVE Final    Comment: (NOTE) The Xpert SA Assay (FDA approved for NASAL specimens in patients 22 years of age and older), is one component of a comprehensive surveillance program. It is not intended to diagnose infection nor to guide or monitor treatment. Performed at [facility], [place] [address] [place], [place] [address]   SARS Coronavirus 2 by RT PCR (hospital order, performed in [facility] hospital lab) Nasopharyngeal Nasopharyngeal Swab     Status: None    Collection Time: 07/30/19  4:05 PM   Specimen: Nasopharyngeal Swab  Result Value Ref Range Status   SARS Coronavirus 2 NEGATIVE NEGATIVE Final    Comment: (NOTE) SARS-CoV-2 target nucleic acids are NOT DETECTED. The SARS-CoV-2 RNA is generally detectable in upper and lower respiratory specimens during the acute phase of infection. The lowest concentration of SARS-CoV-2 viral copies this assay can detect is 250  copies / mL. A negative result does not preclude SARS-CoV-2 infection and should not be used as the sole basis for treatment or other patient management decisions.  A negative result may occur with improper specimen collection / handling, submission of specimen other than nasopharyngeal swab, presence of viral mutation(s) within the areas targeted by this assay, and inadequate number of viral copies (<250 copies / mL). A negative result must be combined with clinical observations, patient history, and epidemiological information. Fact Sheet for Patients:   StrictlyIdeas.no Fact Sheet for Healthcare Providers: BankingDealers.co.za This test is not yet approved or cleared  by the [place] FDA and has been authorized for detection and/or diagnosis of SARS-CoV-2 by FDA under an Emergency Use Authorization (EUA).  This EUA will remain in effect (meaning this test can be used) for the duration of the COVID-19 declaration under Section 564(b)(1) of the Act, 21 U.S.C. section 360bbb-3(b)(1), unless the authorization is terminated or revoked sooner. Performed at [facility], [place] [address], [place], [place] [address]     Procedures and diagnostic studies:  No results found.  Medications:   . docusate sodium  100 mg Oral BID  . enoxaparin (LOVENOX) injection  30 mg Subcutaneous Q24H  . feeding supplement (ENSURE ENLIVE)  237 mL Oral BID BM  . memantine  10 mg Oral Daily  . metoprolol tartrate  12.5 mg Oral BID  .  multivitamin with minerals  1 tablet Oral Daily   Continuous Infusions: . sodium chloride 75 mL/hr at 07/27/19 1557  . methocarbamol (ROBAXIN) IV       LOS: 6 days   Radene Gunning NP  [facility]   How to contact the [facility] Attending or Consulting provider [place] or covering provider during after hours [place], for this patient?  1. Check the care team in [facility] and look for a) attending/consulting TR[facility] provider listed and b) the [facility] team listed 2. Log into [email or web address] and use [place]'s universal password to access. If you do not have the password, please contact the hospital operator. 3. Locate the [facility] provider you are looking for under [facility] and page to a number that you can be directly reached. 4. If you still have difficulty reaching the provider, please page the [facility] (Director on Call) for the Hospitalists listed on amion for assistance.  08/01/2019, 12:38 PM

## 2019-08-02 DIAGNOSIS — Z7401 Bed confinement status: Secondary | ICD-10-CM | POA: Diagnosis not present

## 2019-08-02 DIAGNOSIS — I509 Heart failure, unspecified: Secondary | ICD-10-CM | POA: Diagnosis not present

## 2019-08-02 DIAGNOSIS — S72142A Displaced intertrochanteric fracture of left femur, initial encounter for closed fracture: Secondary | ICD-10-CM | POA: Diagnosis not present

## 2019-08-02 DIAGNOSIS — Z79899 Other long term (current) drug therapy: Secondary | ICD-10-CM | POA: Diagnosis not present

## 2019-08-02 DIAGNOSIS — D72828 Other elevated white blood cell count: Secondary | ICD-10-CM | POA: Diagnosis not present

## 2019-08-02 DIAGNOSIS — Z4789 Encounter for other orthopedic aftercare: Secondary | ICD-10-CM | POA: Diagnosis not present

## 2019-08-02 DIAGNOSIS — M25552 Pain in left hip: Secondary | ICD-10-CM | POA: Diagnosis not present

## 2019-08-02 DIAGNOSIS — R262 Difficulty in walking, not elsewhere classified: Secondary | ICD-10-CM | POA: Diagnosis not present

## 2019-08-02 DIAGNOSIS — I10 Essential (primary) hypertension: Secondary | ICD-10-CM | POA: Diagnosis not present

## 2019-08-02 DIAGNOSIS — N390 Urinary tract infection, site not specified: Secondary | ICD-10-CM | POA: Diagnosis not present

## 2019-08-02 DIAGNOSIS — F0280 Dementia in other diseases classified elsewhere without behavioral disturbance: Secondary | ICD-10-CM | POA: Diagnosis not present

## 2019-08-02 DIAGNOSIS — I2510 Atherosclerotic heart disease of native coronary artery without angina pectoris: Secondary | ICD-10-CM | POA: Diagnosis not present

## 2019-08-02 DIAGNOSIS — R5381 Other malaise: Secondary | ICD-10-CM | POA: Diagnosis not present

## 2019-08-02 DIAGNOSIS — F0390 Unspecified dementia without behavioral disturbance: Secondary | ICD-10-CM | POA: Diagnosis not present

## 2019-08-02 DIAGNOSIS — M2550 Pain in unspecified joint: Secondary | ICD-10-CM | POA: Diagnosis not present

## 2019-08-02 DIAGNOSIS — Z951 Presence of aortocoronary bypass graft: Secondary | ICD-10-CM | POA: Diagnosis not present

## 2019-08-02 DIAGNOSIS — I5032 Chronic diastolic (congestive) heart failure: Secondary | ICD-10-CM | POA: Diagnosis not present

## 2019-08-02 DIAGNOSIS — R41841 Cognitive communication deficit: Secondary | ICD-10-CM | POA: Diagnosis not present

## 2019-08-02 DIAGNOSIS — S72142S Displaced intertrochanteric fracture of left femur, sequela: Secondary | ICD-10-CM | POA: Diagnosis not present

## 2019-08-02 DIAGNOSIS — M6281 Muscle weakness (generalized): Secondary | ICD-10-CM | POA: Diagnosis not present

## 2019-08-02 DIAGNOSIS — I480 Paroxysmal atrial fibrillation: Secondary | ICD-10-CM | POA: Diagnosis not present

## 2019-08-02 DIAGNOSIS — Z950 Presence of cardiac pacemaker: Secondary | ICD-10-CM | POA: Diagnosis not present

## 2019-08-02 DIAGNOSIS — R1312 Dysphagia, oropharyngeal phase: Secondary | ICD-10-CM | POA: Diagnosis not present

## 2019-08-02 DIAGNOSIS — L89626 Pressure-induced deep tissue damage of left heel: Secondary | ICD-10-CM | POA: Diagnosis not present

## 2019-08-02 LAB — CBC WITH DIFFERENTIAL/PLATELET
Abs Immature Granulocytes: 0.07 10*3/uL (ref 0.00–0.07)
Basophils Absolute: 0.1 10*3/uL (ref 0.0–0.1)
Basophils Relative: 1 %
Eosinophils Absolute: 0.2 10*3/uL (ref 0.0–0.5)
Eosinophils Relative: 2 %
HCT: 41.7 % (ref 36.0–46.0)
Hemoglobin: 13.5 g/dL (ref 12.0–15.0)
Immature Granulocytes: 1 %
Lymphocytes Relative: 15 %
Lymphs Abs: 1.8 10*3/uL (ref 0.7–4.0)
MCH: 28.4 pg (ref 26.0–34.0)
MCHC: 32.4 g/dL (ref 30.0–36.0)
MCV: 87.8 fL (ref 80.0–100.0)
Monocytes Absolute: 0.9 10*3/uL (ref 0.1–1.0)
Monocytes Relative: 8 %
Neutro Abs: 8.9 10*3/uL — ABNORMAL HIGH (ref 1.7–7.7)
Neutrophils Relative %: 73 %
Platelets: 285 10*3/uL (ref 150–400)
RBC: 4.75 MIL/uL (ref 3.87–5.11)
RDW: 14.6 % (ref 11.5–15.5)
WBC: 12 10*3/uL — ABNORMAL HIGH (ref 4.0–10.5)
nRBC: 0 % (ref 0.0–0.2)

## 2019-08-02 LAB — SARS CORONAVIRUS 2 (TAT 6-24 HRS): SARS Coronavirus 2: NEGATIVE

## 2019-08-02 NOTE — TOC Transition Note (Signed)
Transition of Care [facility]) - CM/SW Discharge Note   Patient Details  Name: Cheryl Zamora MRN: [id number] Date of Birth: [birth year]  Transition of Care [facility]) CM/SW Contact:  Curlene Labrum, RN Phone Number: 08/02/2019, 10:19 AM   Clinical Narrative:    Case management called [place] this morning and will be transported by [facility].  PTAR called and scheduled for 1130 - She will be transported to room 1107 P - Report to be called to [phone] by the primary nurse.  The nephew is aware of the patient's transfer to [facility] this morning.     Barriers to Discharge: Continued Medical Work up   Patient Goals and CMS Choice Patient states their goals for this hospitalization and ongoing recovery are:: Pt was not able to verbalize goals. CMS Medicare.gov Compare Post Acute Care list provided to:: Patient Choice offered to / list presented to : Patient  Discharge Placement                       Discharge Plan and Services                                     Social Determinants of Health (SDOH) Interventions     Readmission Risk Interventions No flowsheet data found.

## 2019-08-02 NOTE — Progress Notes (Signed)
Report called to Airline pilot. Out by stretcher by Corey Harold

## 2019-08-02 NOTE — Progress Notes (Signed)
Occupational Therapy Treatment Patient Details Name: Cheryl Zamora MRN: [id number] DOB: November 27, 1923 Today's Date: 08/02/2019    History of present illness Pt is a 86 yo f s/p L hip IM nail secondary to fracture from a fall at a nursing home. PMH significant for but not limited to: CABG, a-fib, CHF,OA, back surgery, pacemaker in 2018, dementia   OT comments  Patient continues to make progress towards goals in skilled OT session. Patient's session encompassed bed mobility to complete peri-care as pt was attempting to urinate upon arrival and transport was anticipated to discharge to SNF. Pt agreeable to participate in therapy, however requires multi-modal cues for basic tasks and only oriented to name. Attempted bed level exercises, however pt fatigued quickly and could participate past one rep of a leg life on either side. Discharge remains appropriate at this time; will continue to follow acutely.    Follow Up Recommendations  SNF;Supervision/Assistance - 24 hour    Equipment Recommendations  3 in 1 bedside commode;Hospital bed    Recommendations for Other Services      Precautions / Restrictions Precautions Precautions: Fall Restrictions Weight Bearing Restrictions: Yes LLE Weight Bearing: Weight bearing as tolerated       Mobility Bed Mobility Overal bed mobility: Needs Assistance Bed Mobility: Rolling Rolling: +2 for physical assistance;Min assist;Mod assist         General bed mobility comments: rolled bilaterally multiple times for pericare as pt attempting to urinate upon arrival; she required multimodal cueing to complete tasks  Transfers                      Balance                                           ADL either performed or assessed with clinical judgement   ADL Overall ADL's : Needs assistance/impaired       Grooming Details (indicate cue type and reason): Unable to sequence wahsing face today     Lower Body Bathing: Total  assistance Lower Body Bathing Details (indicate cue type and reason): Donning socks             Toileting- Clothing Manipulation and Hygiene: Total assistance;Bed level Toileting - Clothing Manipulation Details (indicate cue type and reason): Increased time to follow cues increased ability to roll       General ADL Comments: pt limited to bed level activities due to need to be cleaned up;due to cognitive limitations, pt required cues for sequencing     Vision       Perception     Praxis      Cognition Arousal/Alertness: Lethargic Behavior During Therapy: WFL for tasks assessed/performed Overall Cognitive Status: Impaired/Different from baseline Area of Impairment: Orientation;Memory;Following commands;Problem solving                 Orientation Level: Disoriented to;Time;Situation   Memory: Decreased short-term memory;Decreased recall of precautions Following Commands: Follows one step commands inconsistently;Follows one step commands with increased time     Problem Solving: Slow processing;Decreased initiation;Difficulty sequencing;Requires tactile cues;Requires verbal cues General Comments: Pt cooperative and follows 1 step commands with increased time        Exercises     Shoulder Instructions       General Comments      Pertinent Vitals/ Pain       Pain Assessment:  Faces Faces Pain Scale: Hurts little more Pain Location: L hip Pain Descriptors / Indicators: Grimacing;Guarding Pain Intervention(s): Limited activity within patient's tolerance;Monitored during session;Repositioned  Home Living                                          Prior Functioning/Environment              Frequency  Min 2X/week        Progress Toward Goals  OT Goals(current goals can now be found in the care plan section)  Progress towards OT goals: Progressing toward goals  Acute Rehab OT Goals Patient Stated Goal: Pt could not formulate a  goal OT Goal Formulation: Patient unable to participate in goal setting Time For Goal Achievement: 08/13/19 Potential to Achieve Goals: [place] Discharge plan remains appropriate    Co-evaluation                 AM-PAC OT "6 Clicks" Daily Activity     Outcome Measure   Help from another person eating meals?: A Little Help from another person taking care of personal grooming?: A Little Help from another person toileting, which includes using toliet, bedpan, or urinal?: A Lot Help from another person bathing (including washing, rinsing, drying)?: A Lot Help from another person to put on and taking off regular upper body clothing?: A Little Help from another person to put on and taking off regular lower body clothing?: A Lot 6 Click Score: 15    End of Session    OT Visit Diagnosis: Other abnormalities of gait and mobility (R26.89);Repeated falls (R29.6);Muscle weakness (generalized) (M62.81);History of falling (Z91.81);Other symptoms and signs involving cognitive function;Pain Pain - Right/Left: Left Pain - part of body: Hip   Activity Tolerance Patient limited by pain   Patient Left in bed;with call bell/phone within reach;with bed alarm set   Nurse Communication Mobility status        Time: 6384-5364 OT Time Calculation (min): 11 min  Charges: OT General Charges $OT Visit: 1 Visit OT Treatments $Self Care/Home Management : 8-22 mins  Corinne Ports E. [place], [place] [facility] [phone] [place] 08/02/2019, 1:26 PM

## 2019-08-02 NOTE — Discharge Summary (Addendum)
Physician Discharge Summary  [name] TKP:[id number] DOB: March 18, 1923 DOA: 07/26/2019  PCP: Welford Roche, NP  Admit date: 07/26/2019 Discharge date: 08/02/2019  Admitted From: home Discharge disposition: [facility]   Recommendations for Outpatient Follow-Up:   Follow-up with orthopedic surgery in 2 weeks for staple removal and evaluation of hip   Discharge Diagnosis:   Principal Problem:   Displaced intertrochanteric fracture of left femur, initial encounter for closed fracture [facility]) Active Problems:   PAF (paroxysmal atrial fibrillation) (HCC)   Essential hypertension   CKD (chronic kidney disease) stage 3, GFR 30-59 ml/min   CAD, multiple vessel, hx CABG 2012   Chronic diastolic (congestive) heart failure (HCC)   Nodule of middle lobe of right lung    Discharge Condition: Improved.  Diet recommendation: Low sodium, heart healthy.      Code status: DNR   History of Present Illness:   Cheryl Zamora is a 86 year old female with past medical history that includes dementia, atrial fibrillation not on anticoagulation, history of CAD status post CABG, hypertension, chronic kidney disease stage II, renal calculus, pulmonary nodule, sick sinus syndrome with pacemaker, chronic diastolic heart failure who was admitted May 27 after an unwitnessed fall.  She had fallen 5 days prior was evaluated and no fracture was found.  She fell again and work-up revealed a left intertrochanteric fracture.  Evaluated by orthopedics who recommend surgical repair.  She was also evaluated by cardiology who cleared for surgery May 27.  She was unable to go back to the facility from which she came she is now being placed at [facility].   Hospital Course by Problem:   #1.  Left hip fracture.  Underwent surgical repair May 28.  Prior to surgery cardiology consulted for clearance and patient was cleared.  She is status post ORIF with intramedullary implant.  Evaluated by physical therapy  who recommended SNF.  Unable to return to the facility from which she came.  Will be going to [facility] Chart review indicates postoperative plan for orthopedics was to be weightbearing as tolerated and follow-up in 2 weeks from discharge for staple removal.    Lovenox prescription and pain medicine prescription provided by orthopedics.   #2.  Hypertension.  Fair control.  Home medications include a beta-blocker.  Continue metoprolol   #3.  CAD, multiple vessel.  History of CABG 2012.  She was evaluated by cardiology preoperatively and cleared for surgery.  No chest pain.   #4.  Paroxysmal atrial fibrillation.  Not on anticoagulation given her risk of falls.  Home medications include a beta-blocker.  EKG reveals sinus rhythm   #5.  Chronic diastolic heart failure.  Remained compensated.  Home medications include metoprolol.     #6.  Chronic kidney disease.  Stage III.  Stable at baseline.   #7.  Hypokalemia.  Resolved.   #8.  Rib fracture.  Chest x-ray revealed right lower rib fractures that appear subacute. IS   #9.  Dementia.  Stable at baseline.  Home medications include Namenda      Medical Consultants:    [facility]  Discharge Exam:   Vitals:   08/02/19 0332 08/02/19 0808  BP: (!) 157/62 (!) 161/82  Pulse: 71 71  Resp: 14 14  Temp: 98 F (36.7 C) 97.6 F (36.4 C)  SpO2: 98% 100%   Vitals:   08/01/19 1544 08/01/19 2014 08/02/19 0332 08/02/19 0808  BP: (!) 155/51 (!) 141/64 (!) 157/62 (!) 161/82  Pulse: 70  73 71 71  Resp: 17 14 14 14   Temp: 98.4 F (36.9 C) 97.9 F (36.6 C) 98 F (36.7 C) 97.6 F (36.4 C)  TempSrc: Oral Oral Oral Oral  SpO2: 97% 97% 98% 100%  Weight:      Height:        General exam: Appears calm and comfortable. Eating breakfast. Denies pain/discomfort Respiratory system: Clear to auscultation. Respiratory effort normal. Cardiovascular system: S1 & S2 heard, RRR. No JVD,  rubs, gallops or clicks. No murmurs. Gastrointestinal  system: Abdomen is nondistended, soft and nontender. No organomegaly or masses felt. Normal bowel sounds heard. Central nervous system: Alert and oriented. No focal neurological deficits. Extremities: No clubbing,  or cyanosis. No edema. Left hip dressing dry and intact Skin: No rashes, lesions or ulcers. Psychiatry: Judgement and insight appear normal. Mood & affect appropriate.    The results of significant diagnostics from this hospitalization (including imaging, microbiology, ancillary and laboratory) are listed below for reference.     Procedures and Diagnostic Studies:   DG Thoracic Spine 2 View  Result Date: 07/26/2019 CLINICAL DATA:  Un witnessed fall, found down EXAM: THORACIC SPINE 2 VIEWS COMPARISON:  07/21/2019 FINDINGS: Frontal and cross-table lateral views of the thoracic spine are obtained. Lateral views are suboptimal due to positioning and overlying structures. Stable right convex curvature centered in the lower thoracic spine. Otherwise alignment appears anatomic. There is extensive multilevel thoracic spondylosis unchanged. No evidence of acute fracture. IMPRESSION: 1. No gross displaced fracture. Evaluation limited by technique and positioning. Electronically Signed   By: Randa Ngo M.D.   On: 07/26/2019 19:12   DG Wrist Complete Left  Result Date: 07/26/2019 CLINICAL DATA:  Un witnessed fall, left hand and wrist bruising and skin tears EXAM: LEFT HAND - COMPLETE 3+ VIEW; LEFT WRIST - COMPLETE 3+ VIEW COMPARISON:  None. FINDINGS: Left wrist: Frontal, oblique, and lateral views of the left wrist are obtained as well as an ulnar deviated view. There is diffuse osteoarthritis throughout the left wrist. No fracture, subluxation, or dislocation. Soft tissues are unremarkable. Left hand: Frontal, oblique, lateral views of the left hand are obtained. Bones are diffusely osteopenic. No acute displaced fracture, subluxation, or dislocation. There is diffuse interphalangeal joint  space narrowing, most pronounced at the first and second digits. Soft tissues are unremarkable. IMPRESSION: 1. 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 2. No acute displaced fracture. 3. Osteopenia. Electronically Signed   By: Randa Ngo M.D.   On: 07/26/2019 21:11   CT Head Wo Contrast  Result Date: 07/26/2019 CLINICAL DATA:  Posttraumatic headache after fall. EXAM: CT HEAD WITHOUT CONTRAST CT CERVICAL SPINE WITHOUT CONTRAST TECHNIQUE: Multidetector CT imaging of the head and cervical spine was performed following the standard protocol without intravenous contrast. Multiplanar CT image reconstructions of the cervical spine were also generated. COMPARISON:  Jul 21, 2019. FINDINGS: CT HEAD FINDINGS Brain: Mild diffuse cortical atrophy is noted. Mild chronic ischemic white matter disease is noted. No mass effect or midline shift is noted. Ventricular size is within normal limits. There is no evidence of mass lesion, hemorrhage or acute infarction. Vascular: No hyperdense vessel or unexpected calcification. Skull: Normal. Negative for fracture or focal lesion. Sinuses/Orbits: No acute finding. Other: Small left frontal scalp hematoma is noted. CT CERVICAL SPINE FINDINGS Alignment: Mild grade 1 anterolisthesis of C4-5 is noted secondary to posterior facet joint hypertrophy. Skull base and vertebrae: No acute fracture. No primary bone lesion or focal pathologic process. Soft tissues and spinal  canal: No prevertebral fluid or swelling. No visible canal hematoma. Disc levels: Severe degenerative disc disease is noted at C5-6, C6-7 and C7-T1 with anterior and posterior osteophyte formation. Upper chest: Negative. Other: Degenerative changes are seen involving posterior facet joints bilaterally. IMPRESSION: 1. Mild diffuse cortical atrophy. Mild chronic ischemic white matter disease. Small left frontal scalp hematoma. No acute intracranial abnormality seen. 2. Severe multilevel degenerative disc disease.  No acute abnormality seen in the cervical spine. Electronically Signed   By: Marijo Conception M.D.   On: 07/26/2019 18:33   CT Cervical Spine Wo Contrast  Result Date: 07/26/2019 CLINICAL DATA:  Posttraumatic headache after fall. EXAM: CT HEAD WITHOUT CONTRAST CT CERVICAL SPINE WITHOUT CONTRAST TECHNIQUE: Multidetector CT imaging of the head and cervical spine was performed following the standard protocol without intravenous contrast. Multiplanar CT image reconstructions of the cervical spine were also generated. COMPARISON:  Jul 21, 2019. FINDINGS: CT HEAD FINDINGS Brain: Mild diffuse cortical atrophy is noted. Mild chronic ischemic white matter disease is noted. No mass effect or midline shift is noted. Ventricular size is within normal limits. There is no evidence of mass lesion, hemorrhage or acute infarction. Vascular: No hyperdense vessel or unexpected calcification. Skull: Normal. Negative for fracture or focal lesion. Sinuses/Orbits: No acute finding. Other: Small left frontal scalp hematoma is noted. CT CERVICAL SPINE FINDINGS Alignment: Mild grade 1 anterolisthesis of C4-5 is noted secondary to posterior facet joint hypertrophy. Skull base and vertebrae: No acute fracture. No primary bone lesion or focal pathologic process. Soft tissues and spinal canal: No prevertebral fluid or swelling. No visible canal hematoma. Disc levels: Severe degenerative disc disease is noted at C5-6, C6-7 and C7-T1 with anterior and posterior osteophyte formation. Upper chest: Negative. Other: Degenerative changes are seen involving posterior facet joints bilaterally. IMPRESSION: 1. Mild diffuse cortical atrophy. Mild chronic ischemic white matter disease. Small left frontal scalp hematoma. No acute intracranial abnormality seen. 2. Severe multilevel degenerative disc disease. No acute abnormality seen in the cervical spine. Electronically Signed   By: Marijo Conception M.D.   On: 07/26/2019 18:33   Pelvis Portable  Result  Date: 07/27/2019 CLINICAL DATA:  Postop EXAM: PORTABLE PELVIS 1-2 VIEWS COMPARISON:  07/26/2019 FINDINGS: Incompletely visualized intramedullary rod within the left femur, bridging a left intertrochanteric fracture with anatomic alignment on single view. Hardware appears intact. Vascular calcifications. IMPRESSION: Partially visualized intramedullary left femoral rod for intertrochanteric fracture. Gas in the soft tissues consistent with recent surgery. Electronically Signed   By: Donavan Foil M.D.   On: 07/27/2019 16:00   DG Chest Portable 1 View  Result Date: 07/26/2019 CLINICAL DATA:  Hip fracture.  Screening.  Unwitnessed cough. EXAM: PORTABLE CHEST 1 VIEW COMPARISON:  Radiograph 03/24/2019, CT 03/26/2019 FINDINGS: Single lead left-sided pacemaker. Median sternotomy and CABG. Mild cardiomegaly is chronic and unchanged. Aortic atherosclerosis. Mild chronic peribronchial thickening. No focal airspace disease. No pleural fluid or pneumothorax. No pulmonary edema. The bones are under mineralized. No acute osseous abnormalities are seen. Right lower rib fractures have surrounding callus formation and appear at least subacute. IMPRESSION: 1. No acute chest findings. 2. Post CABG with mild chronic cardiomegaly. 3. Right lower rib fractures have surrounding callus formation and appear at least subacute. Electronically Signed   By: Keith Rake M.D.   On: 07/26/2019 19:14   DG Hand Complete Left  Result Date: 07/26/2019 CLINICAL DATA:  Un witnessed fall, left hand and wrist bruising and skin tears EXAM: LEFT HAND - COMPLETE 3+ VIEW; LEFT  WRIST - COMPLETE 3+ VIEW COMPARISON:  None. FINDINGS: Left wrist: Frontal, oblique, and lateral views of the left wrist are obtained as well as an ulnar deviated view. There is diffuse osteoarthritis throughout the left wrist. No fracture, subluxation, or dislocation. Soft tissues are unremarkable. Left hand: Frontal, oblique, lateral views of the left hand are obtained.  Bones are diffusely osteopenic. No acute displaced fracture, subluxation, or dislocation. There is diffuse interphalangeal joint space narrowing, most pronounced at the first and second digits. Soft tissues are unremarkable. IMPRESSION: 1. 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 2. No acute displaced fracture. 3. Osteopenia. Electronically Signed   By: Randa Ngo M.D.   On: 07/26/2019 21:11   DG C-Arm 1-60 Min  Result Date: 07/27/2019 CLINICAL DATA:  Left femur IM nail EXAM: DG C-ARM 1-60 MIN; LEFT FEMUR 2 VIEWS CONTRAST:  None FLUOROSCOPY TIME:  Fluoroscopy Time:  1 minutes 29 seconds Number of Acquired Spot Images: 4 COMPARISON:  07/26/2019 FINDINGS: Four low resolution intraoperative spot views of the left femur. The images demonstrate intramedullary rod and distal screw fixation of the left femur for intertrochanteric fracture. There is anatomic alignment. IMPRESSION: Intraoperative fluoroscopic assistance provided during surgical fixation of proximal left femoral fracture Electronically Signed   By: Donavan Foil M.D.   On: 07/27/2019 16:00   ECHOCARDIOGRAM COMPLETE  Result Date: 07/27/2019    ECHOCARDIOGRAM REPORT   Patient Name:   LEXEE BRASHEARS Date of Exam: 07/27/2019 Medical Rec #:  [id number]      Height:       63.0 in Accession #:    [id number]     Weight:       124.0 lb Date of Birth:  1923/08/25       BSA:          1.578 m Patient Age:    86 years       BP:           150/64 mmHg Patient Gender: F              HR:           71 bpm. Exam Location:  Inpatient Procedure: 2D Echo, Cardiac Doppler and Color Doppler STAT ECHO Indications:    CHF-Acute Diastolic 829.56 / O13.08  History:        Patient has prior history of Echocardiogram examinations, most                 recent 04/12/2014. CHF, CAD, Pacemaker, Arrythmias:Atrial                 Fibrillation; Risk Factors:Hypertension and Former Smoker. DVT.                 GERD.  Sonographer:    Vickie Epley RDCS Referring Phys: [id number]  [place]  1. Left ventricular ejection fraction, by estimation, is 50 to 55%. The left ventricle has low normal function. The left ventricle has no regional wall motion abnormalities. Left ventricular diastolic parameters are consistent with Grade II diastolic dysfunction (pseudonormalization). Elevated left ventricular end-diastolic pressure.  2. Right ventricular systolic function is mildly reduced. The right ventricular size is normal. There is mildly elevated pulmonary artery systolic pressure. The estimated right ventricular systolic pressure is 96.2 mmHg.  3. The mitral valve is abnormal. Mild mitral valve regurgitation.  4. The aortic valve is tricuspid. Aortic valve regurgitation is not visualized. Mild aortic valve sclerosis is present, with no evidence of aortic valve stenosis.  5. The inferior vena cava is normal in size with greater than 50% respiratory variability, suggesting right atrial pressure of 3 mmHg. FINDINGS  Left Ventricle: Left ventricular ejection fraction, by estimation, is 50 to 55%. The left ventricle has low normal function. The left ventricle has no regional wall motion abnormalities. The left ventricular internal cavity size was normal in size. There is no left ventricular hypertrophy. Abnormal (paradoxical) septal motion, consistent with RV pacemaker. Left ventricular diastolic parameters are consistent with Grade II diastolic dysfunction (pseudonormalization). Elevated left ventricular end-diastolic pressure. Right Ventricle: The right ventricular size is normal. No increase in right ventricular wall thickness. Right ventricular systolic function is mildly reduced. There is mildly elevated pulmonary artery systolic pressure. The tricuspid regurgitant velocity  is 3.13 m/s, and with an assumed right atrial pressure of 3 mmHg, the estimated right ventricular systolic pressure is 06.2 mmHg. Left Atrium: Left atrial size was normal in size. Right Atrium: Right atrial size  was normal in size. Pericardium: There is no evidence of pericardial effusion. Mitral Valve: The mitral valve is abnormal. There is mild thickening of the mitral valve leaflet(s). Mild mitral valve regurgitation. Tricuspid Valve: The tricuspid valve is grossly normal. Tricuspid valve regurgitation is mild. Aortic Valve: The aortic valve is tricuspid. Aortic valve regurgitation is not visualized. Mild aortic valve sclerosis is present, with no evidence of aortic valve stenosis. Pulmonic Valve: The pulmonic valve was grossly normal. Pulmonic valve regurgitation is trivial. Aorta: The aortic root and ascending aorta are structurally normal, with no evidence of dilitation. Venous: The inferior vena cava is normal in size with greater than 50% respiratory variability, suggesting right atrial pressure of 3 mmHg. IAS/Shunts: No atrial level shunt detected by color flow Doppler. Additional Comments: A pacer wire is visualized.  LEFT VENTRICLE PLAX 2D LVIDd:         4.00 cm LVIDs:         3.10 cm LV PW:         0.80 cm LV IVS:        0.80 cm LVOT diam:     1.70 cm LV SV:         30 LV SV Index:   19 LVOT Area:     2.27 cm  LV Volumes (MOD) LV vol d, MOD A2C: 48.9 ml LV vol d, MOD A4C: 43.9 ml LV vol s, MOD A2C: 22.9 ml LV vol s, MOD A4C: 21.0 ml LV SV MOD A2C:     26.0 ml LV SV MOD A4C:     43.9 ml LV SV MOD BP:      24.4 ml RIGHT VENTRICLE RV S prime:     6.97 cm/s TAPSE (M-mode): 0.9 cm LEFT ATRIUM             Index       RIGHT ATRIUM           Index LA diam:        4.40 cm 2.79 cm/m  RA Area:     13.90 cm LA Vol (A2C):   27.0 ml 17.11 ml/m RA Volume:   38.10 ml  24.14 ml/m LA Vol (A4C):   25.5 ml 16.16 ml/m LA Biplane Vol: 26.2 ml 16.60 ml/m  AORTIC VALVE LVOT Vmax:   59.50 cm/s LVOT Vmean:  42.200 cm/s LVOT VTI:    0.133 m  AORTA Ao Root diam: 2.90 cm MR Peak grad: 133.6 mmHg  TRICUSPID VALVE MR Vmax:      578.00 cm/s TR  Peak grad:   39.2 mmHg                           TR Vmax:        313.00 cm/s                             SHUNTS                           Systemic VTI:  0.13 m                           Systemic Diam: 1.70 cm Lyman Bishop MD Electronically signed by Lyman Bishop MD Signature Date/Time: 07/27/2019/9:19:07 AM    Final    DG Hip Unilat W or Wo Pelvis 2-3 Views Left  Result Date: 07/26/2019 CLINICAL DATA:  Left-sided hip pain after fall EXAM: DG HIP (WITH OR WITHOUT PELVIS) 2-3V LEFT COMPARISON:  07/21/2019 FINDINGS: Acute mildly comminuted left intertrochanteric fracture without angulation. Femoral head is normally aligned. Pubic symphysis and rami are intact. IMPRESSION: Acute left intertrochanteric fracture Electronically Signed   By: Donavan Foil M.D.   On: 07/26/2019 18:13   DG FEMUR MIN 2 VIEWS LEFT  Result Date: 07/27/2019 CLINICAL DATA:  Left femur IM nail EXAM: DG C-ARM 1-60 MIN; LEFT FEMUR 2 VIEWS CONTRAST:  None FLUOROSCOPY TIME:  Fluoroscopy Time:  1 minutes 29 seconds Number of Acquired Spot Images: 4 COMPARISON:  07/26/2019 FINDINGS: Four low resolution intraoperative spot views of the left femur. The images demonstrate intramedullary rod and distal screw fixation of the left femur for intertrochanteric fracture. There is anatomic alignment. IMPRESSION: Intraoperative fluoroscopic assistance provided during surgical fixation of proximal left femoral fracture Electronically Signed   By: Donavan Foil M.D.   On: 07/27/2019 16:00     Labs:   Basic Metabolic Panel: Recent Labs  Lab 07/26/19 1900 07/26/19 1900 07/27/19 0600 07/27/19 0600 07/28/19 0947 07/29/19 0348  NA 139  --  137  --  135 136  K 3.5   < > 3.3*   < > 3.8 3.9  CL 102  --  102  --  99 101  CO2 25  --  26  --  24 28  GLUCOSE 145*  --  155*  --  271* 108*  BUN 14  --  12  --  14 16  CREATININE 0.90  --  0.88  --  1.09* 0.91  CALCIUM 9.4  --  9.6  --  9.3 9.4   < > = values in this interval not displayed.   GFR Estimated Creatinine Clearance: 32 mL/min (by C-G formula based on SCr of 0.91 mg/dL). Liver  Function Tests: Recent Labs  Lab 07/26/19 1900  AST 19  ALT 14  ALKPHOS 69  BILITOT 1.0  PROT 7.0  ALBUMIN 3.9   No results for input(s): LIPASE, AMYLASE in the last 168 hours. No results for input(s): AMMONIA in the last 168 hours. Coagulation profile No results for input(s): INR, PROTIME in the last 168 hours.  CBC: Recent Labs  Lab 07/29/19 0348 07/30/19 0316 07/31/19 0414 08/01/19 0311 08/02/19 0400  WBC 19.1* 11.7* 10.1 13.1* 12.0*  NEUTROABS 15.1* 7.7 7.0 9.8* 8.9*  HGB 12.8 11.8* 12.4 13.8 13.5  HCT 40.7 36.9 39.2 43.0 41.7  MCV 88.7 88.7 88.7 87.8  87.8  PLT 227 214 216 234 285   Cardiac Enzymes: No results for input(s): CKTOTAL, CKMB, CKMBINDEX, TROPONINI in the last 168 hours. BNP: Invalid input(s): POCBNP CBG: No results for input(s): GLUCAP in the last 168 hours. D-Dimer No results for input(s): DDIMER in the last 72 hours. Hgb A1c No results for input(s): HGBA1C in the last 72 hours. Lipid Profile No results for input(s): CHOL, HDL, LDLCALC, TRIG, CHOLHDL, LDLDIRECT in the last 72 hours. Thyroid function studies No results for input(s): TSH, T4TOTAL, T3FREE, THYROIDAB in the last 72 hours.  Invalid input(s): FREET3 Anemia work up No results for input(s): VITAMINB12, FOLATE, FERRITIN, TIBC, IRON, RETICCTPCT in the last 72 hours. Microbiology Recent Results (from the past 240 hour(s))  SARS Coronavirus 2 by RT PCR (hospital order, performed in [facility] hospital lab) Nasopharyngeal Nasopharyngeal Swab     Status: None   Collection Time: 07/26/19  7:00 PM   Specimen: Nasopharyngeal Swab  Result Value Ref Range Status   SARS Coronavirus 2 NEGATIVE NEGATIVE Final    Comment: (NOTE) SARS-CoV-2 target nucleic acids are NOT DETECTED. The SARS-CoV-2 RNA is generally detectable in upper and lower respiratory specimens during the acute phase of infection. The lowest concentration of SARS-CoV-2 viral copies this assay can detect is 250 copies / mL. A  negative result does not preclude SARS-CoV-2 infection and should not be used as the sole basis for treatment or other patient management decisions.  A negative result may occur with improper specimen collection / handling, submission of specimen other than nasopharyngeal swab, presence of viral mutation(s) within the areas targeted by this assay, and inadequate number of viral copies (<250 copies / mL). A negative result must be combined with clinical observations, patient history, and epidemiological information. Fact Sheet for Patients:   StrictlyIdeas.no Fact Sheet for Healthcare Providers: BankingDealers.co.za This test is not yet approved or cleared  by the [place] FDA and has been authorized for detection and/or diagnosis of SARS-CoV-2 by FDA under an Emergency Use Authorization (EUA).  This EUA will remain in effect (meaning this test can be used) for the duration of the COVID-19 declaration under Section 564(b)(1) of the Act, 21 U.S.C. section 360bbb-3(b)(1), unless the authorization is terminated or revoked sooner. Performed at [facility], [place] [address] [place], [place] [address]   Surgical pcr screen     Status: None   Collection Time: 07/27/19 12:28 AM   Specimen: Nasal Mucosa; Nasal Swab  Result Value Ref Range Status   MRSA, PCR NEGATIVE NEGATIVE Final   Staphylococcus aureus NEGATIVE NEGATIVE Final    Comment: (NOTE) The Xpert SA Assay (FDA approved for NASAL specimens in patients 53 years of age and older), is one component of a comprehensive surveillance program. It is not intended to diagnose infection nor to guide or monitor treatment. Performed at [facility], [place] [address], [place], [place] [address]   SARS Coronavirus 2 by RT PCR (hospital order, performed in [facility] hospital lab) Nasopharyngeal Nasopharyngeal Swab     Status: None   Collection Time: 07/30/19  4:05 PM    Specimen: Nasopharyngeal Swab  Result Value Ref Range Status   SARS Coronavirus 2 NEGATIVE NEGATIVE Final    Comment: (NOTE) SARS-CoV-2 target nucleic acids are NOT DETECTED. The SARS-CoV-2 RNA is generally detectable in upper and lower respiratory specimens during the acute phase of infection. The lowest concentration of SARS-CoV-2 viral copies this assay can detect is 250 copies / mL. A negative  result does not preclude SARS-CoV-2 infection and should not be used as the sole basis for treatment or other patient management decisions.  A negative result may occur with improper specimen collection / handling, submission of specimen other than nasopharyngeal swab, presence of viral mutation(s) within the areas targeted by this assay, and inadequate number of viral copies (<250 copies / mL). A negative result must be combined with clinical observations, patient history, and epidemiological information. Fact Sheet for Patients:   StrictlyIdeas.no Fact Sheet for Healthcare Providers: BankingDealers.co.za This test is not yet approved or cleared  by the [place] FDA and has been authorized for detection and/or diagnosis of SARS-CoV-2 by FDA under an Emergency Use Authorization (EUA).  This EUA will remain in effect (meaning this test can be used) for the duration of the COVID-19 declaration under Section 564(b)(1) of the Act, 21 U.S.C. section 360bbb-3(b)(1), unless the authorization is terminated or revoked sooner. Performed at [facility], [address] [place], [place] [address]      Discharge Instructions:   Discharge Instructions     Diet - low sodium heart healthy   Complete by: As directed    Discharge instructions   Complete by: As directed    Follow-up with orthopedics 2 weeks for suture removal Weightbearing as tolerated Take medications as prescribed   Increase activity slowly   Complete by: As directed     Weight bearing as tolerated   Complete by: As directed       Allergies as of 08/02/2019       Reactions   Digoxin And Related Other (See Comments)   Increased HR   Amiodarone Other (See Comments)   Excessive QT prolongation, nausea   Codeine Nausea And Vomiting   Penicillins Nausea Only   Did it involve swelling of the face/tongue/throat, SOB, or low BP? Unknown Did it involve sudden or severe rash/hives, skin peeling, or any reaction on the inside of your mouth or nose? Unknown Did you need to seek medical attention at a hospital or doctor's office? Unknown When did it last happen?       If all above answers are "NO", may proceed with cephalosporin use.        Medication List     TAKE these medications    acetaminophen 325 MG tablet Commonly known as: TYLENOL Take 650 mg by mouth every 6 (six) hours as needed for mild pain.   enoxaparin 40 MG/0.4ML injection Commonly known as: LOVENOX Inject 0.4 mLs (40 mg total) into the skin daily.   feeding supplement (PRO-STAT SUGAR FREE 64) Liqd Take 30 mLs by mouth daily.   HYDROcodone-acetaminophen 5-325 MG tablet Commonly known as: Norco Take 1-2 tablets by mouth 3 (three) times daily as needed.   memantine 10 MG tablet Commonly known as: NAMENDA Take 10 mg by mouth daily.   metoprolol tartrate 25 MG tablet Commonly known as: LOPRESSOR Take 0.5 tablets (12.5 mg total) by mouth 2 (two) times daily.   multivitamin tablet Take 1 tablet by mouth daily.   nystatin powder Generic drug: nystatin Apply 1 application topically every evening.   Vitamin D3 25 MCG (1000 UT) Caps Take 1,000 Units by mouth daily.               Discharge Care Instructions  (From admission, onward)           Start     Ordered   07/27/19 0000  Weight bearing as tolerated  07/27/19 1250           Follow-up Information     Leandrew Koyanagi, MD In 2 weeks.   Specialty: Orthopedic Surgery Why: For suture removal, For wound  re-check Contact information: [place] [address] [phone]         Welford Roche, NP Follow up in 1 week(s).   Specialty: Gerontology Contact information: [place] [address] [phone]         Deboraha Sprang, MD .   Specialty: Cardiology Contact information: [address] [place] [address] [phone]             Time coordinating discharge: 40 minutes  Signed:  Radene Gunning NP  [facility] 08/02/2019, 9:21 AM

## 2019-08-03 ENCOUNTER — Telehealth: Payer: Self-pay | Admitting: Primary Care

## 2019-08-03 ENCOUNTER — Encounter: Payer: Self-pay | Admitting: *Deleted

## 2019-08-03 NOTE — Telephone Encounter (Signed)
T/c  Back to Cheryl Zamora, nephew, who called office with questions about billing. No answer, message left.

## 2019-08-06 DIAGNOSIS — Z950 Presence of cardiac pacemaker: Secondary | ICD-10-CM | POA: Diagnosis not present

## 2019-08-06 DIAGNOSIS — S72142A Displaced intertrochanteric fracture of left femur, initial encounter for closed fracture: Secondary | ICD-10-CM | POA: Diagnosis not present

## 2019-08-06 DIAGNOSIS — I480 Paroxysmal atrial fibrillation: Secondary | ICD-10-CM | POA: Diagnosis not present

## 2019-08-06 DIAGNOSIS — Z951 Presence of aortocoronary bypass graft: Secondary | ICD-10-CM | POA: Diagnosis not present

## 2019-08-07 DIAGNOSIS — I480 Paroxysmal atrial fibrillation: Secondary | ICD-10-CM | POA: Diagnosis not present

## 2019-08-07 DIAGNOSIS — F0280 Dementia in other diseases classified elsewhere without behavioral disturbance: Secondary | ICD-10-CM | POA: Diagnosis not present

## 2019-08-07 DIAGNOSIS — I2510 Atherosclerotic heart disease of native coronary artery without angina pectoris: Secondary | ICD-10-CM | POA: Diagnosis not present

## 2019-08-07 DIAGNOSIS — R262 Difficulty in walking, not elsewhere classified: Secondary | ICD-10-CM | POA: Diagnosis not present

## 2019-08-09 DIAGNOSIS — I5032 Chronic diastolic (congestive) heart failure: Secondary | ICD-10-CM | POA: Diagnosis not present

## 2019-08-09 DIAGNOSIS — I480 Paroxysmal atrial fibrillation: Secondary | ICD-10-CM | POA: Diagnosis not present

## 2019-08-09 DIAGNOSIS — I10 Essential (primary) hypertension: Secondary | ICD-10-CM | POA: Diagnosis not present

## 2019-08-09 DIAGNOSIS — D72828 Other elevated white blood cell count: Secondary | ICD-10-CM | POA: Diagnosis not present

## 2019-08-14 DIAGNOSIS — I10 Essential (primary) hypertension: Secondary | ICD-10-CM | POA: Diagnosis not present

## 2019-08-14 DIAGNOSIS — L89626 Pressure-induced deep tissue damage of left heel: Secondary | ICD-10-CM | POA: Diagnosis not present

## 2019-08-14 DIAGNOSIS — N390 Urinary tract infection, site not specified: Secondary | ICD-10-CM | POA: Diagnosis not present

## 2019-08-14 DIAGNOSIS — F0280 Dementia in other diseases classified elsewhere without behavioral disturbance: Secondary | ICD-10-CM | POA: Diagnosis not present

## 2019-08-16 ENCOUNTER — Telehealth: Payer: Self-pay

## 2019-08-16 ENCOUNTER — Ambulatory Visit (INDEPENDENT_AMBULATORY_CARE_PROVIDER_SITE_OTHER): Payer: Medicare Other

## 2019-08-16 ENCOUNTER — Encounter: Payer: Self-pay | Admitting: Orthopaedic Surgery

## 2019-08-16 ENCOUNTER — Ambulatory Visit (INDEPENDENT_AMBULATORY_CARE_PROVIDER_SITE_OTHER): Payer: Medicare Other | Admitting: Orthopaedic Surgery

## 2019-08-16 VITALS — Ht 63.0 in | Wt 125.0 lb

## 2019-08-16 DIAGNOSIS — M25552 Pain in left hip: Secondary | ICD-10-CM

## 2019-08-16 DIAGNOSIS — S72142A Displaced intertrochanteric fracture of left femur, initial encounter for closed fracture: Secondary | ICD-10-CM

## 2019-08-16 NOTE — Telephone Encounter (Signed)
Faxed OV note from today

## 2019-08-16 NOTE — Telephone Encounter (Signed)
Lily with [facility] like last office note and any recommendations faxed to [phone].  Cb# [phone].  Please advise. Thank you.

## 2019-08-16 NOTE — Progress Notes (Signed)
Post-Op Visit Note   Patient: Cheryl Zamora           Date of Birth: 1923-07-12           MRN: [id number] Visit Date: 08/16/2019 PCP: Welford Roche, NP   Assessment & Plan:  Chief Complaint:  Chief Complaint  Patient presents with  . Left Hip - Routine Post Op    Left hip IM nailing on 07/27/2019   Visit Diagnoses:  1. Displaced intertrochanteric fracture of left femur, initial encounter for closed fracture ([place])   2. Pain in left hip     Plan: Takeisha is 2 weeks status post IM nail left intertrochanteric.  She is doing well overall.  Reports minimal pain.  She is getting physical therapy 5 times a week.  Incisions are all healed.  No signs of infection.  X-rays show stable fixation without any complications.  She looks good from my standpoint.  Continue with PT.  Recheck in 4 weeks with two-view x-rays of the left hip.  Follow-Up Instructions: Return in about 4 weeks (around 09/13/2019).   Orders:  Orders Placed This Encounter  Procedures  . XR FEMUR MIN 2 VIEWS LEFT   No orders of the defined types were placed in this encounter.   Imaging: XR FEMUR MIN 2 VIEWS LEFT  Result Date: 08/16/2019 Stable IM fixation of intertrochanteric fracture without complication.   PMFS History: Patient Active Problem List   Diagnosis Date Noted  . Displaced intertrochanteric fracture of left femur, initial encounter for closed fracture ([place]) 07/26/2019  . Leukocytosis 07/26/2019  . Hydronephrosis of left kidney 03/26/2019  . Staghorn renal calculus 03/26/2019  . Nodule of middle lobe of right lung 03/26/2019  . Palliative care by specialist   . DNR (do not resuscitate) discussion   . Persistent atrial fibrillation ([place])   . CHF (congestive heart failure) ([place]) 11/09/2016  . DVT (deep venous thrombosis) ([place]) 01/13/2015  . Cellulitis of both lower extremities 01/13/2015  . Cellulitis 01/13/2015  . Acute deep vein thrombosis (DVT) of popliteal vein of left lower extremity ([place])     . Hematuria   . Acute on chronic renal insufficiency 01/06/2015  . Atrial fibrillation with RVR ([place])   . Sepsis ([place]) 01/02/2015  . UTI (lower urinary tract infection) 01/02/2015  . Paroxysmal atrial fibrillation (H[facility])   . Chronic diastolic (congestive) heart failure ([place])   . Atrial fibrillation with rapid ventricular response ([place])   . Acute on chronic combined systolic and diastolic congestive heart failure ([place]) 04/12/2014  . CKD (chronic kidney disease) stage 3, GFR 30-59 ml/min 04/11/2014  . CAD, multiple vessel, hx CABG 2012 04/11/2014  . Long term current use of anticoagulant therapy 12/21/2012  . Physical deconditioning 12/17/2012  . PAF (paroxysmal atrial fibrillation) ([place]) 12/12/2012  . Chronic diastolic CHF (congestive heart failure), NYHA class 3 ([place]) 12/12/2012  . Essential hypertension   . Neuropathy    Past Medical History:  Diagnosis Date  . Atrial fibrillation with rapid ventricular response ([place]) 11/2012; 10/2013   a. s/p TEE/DCCV 11/2012 & 10/2013 b. amiodarone caused significant QT prlongation (>653msec)  . Chronic combined systolic and diastolic CHF (congestive heart failure) ([place])   . Coronary artery disease 2005   a. s/p CABG (2012)  . GERD (gastroesophageal reflux disease)   . Hyperlipidemia    pt denies this hx on 01/03/2015  . Hypertension   . Neuropathy   . Osteoarthritis of back     Family History  Problem Relation  Age of Onset  . Heart attack Mother   . Heart attack Father   . Heart attack Brother   . Heart attack Brother   . Heart attack Brother   . Heart attack Brother     Past Surgical History:  Procedure Laterality Date  . ABDOMINAL HYSTERECTOMY    . AV NODE ABLATION N/A 11/16/2016   Procedure: AV Node Ablation;  Surgeon: Constance Haw, MD;  Location: [facility];  Service: Cardiovascular;  Laterality: N/A;  . BACK SURGERY    . CARDIAC CATHETERIZATION  04/12/2003   [place] 60, LAD 90, D1 30, CFX 95->T, RCA OK  .  CARDIOVERSION N/A 12/13/2012   Procedure: CARDIOVERSION;  Surgeon: Larey Dresser, MD;  Location: [facility];  Service: Cardiovascular;  Laterality: N/A;  . CARDIOVERSION N/A 11/13/2013   Procedure: CARDIOVERSION;  Surgeon: Ali Spark, MD;  Location: [facility] ENDOSCOPY;  Service: Cardiovascular;  Laterality: N/A;  . CARDIOVERSION N/A 04/12/2014   Procedure: CARDIOVERSION;  Surgeon: Thayer Headings, MD;  Location: [facility] ENDOSCOPY;  Service: Cardiovascular;  Laterality: N/A;  . CARDIOVERSION N/A 01/06/2015   Procedure: CARDIOVERSION;  Surgeon: Larey Dresser, MD;  Location: [facility];  Service: Cardiovascular;  Laterality: N/A;  . CARDIOVERSION N/A 11/11/2016   Procedure: CARDIOVERSION;  Surgeon: Josue Hector, MD;  Location: [facility] ENDOSCOPY;  Service: Cardiovascular;  Laterality: N/A;  . [place]  1960's   "ruptured disc'  . CORONARY ANGIOPLASTY    . CORONARY ARTERY BYPASS GRAFT  04/18/2003   x 4; LIMA-LAD, SVG-D1, SVG-OM1-OM2  . INTRAMEDULLARY (IM) NAIL INTERTROCHANTERIC Left 07/27/2019   Procedure: INTRAMEDULLARY (IM) NAIL INTERTROCHANTRIC;  Surgeon: Leandrew Koyanagi, MD;  Location: [facility];  Service: Orthopedics;  Laterality: Left;  . PACEMAKER IMPLANT N/A 11/16/2016   Procedure: Pacemaker Implant;  Surgeon: Constance Haw, MD;  Location: [facility];  Service: Cardiovascular;  Laterality: N/A;  . TEE WITHOUT CARDIOVERSION N/A 12/13/2012   Procedure: TRANSESOPHAGEAL ECHOCARDIOGRAM (TEE);  Surgeon: Larey Dresser, MD;  Location: [facility] ENDOSCOPY;  Service: Cardiovascular;  Laterality: N/A;  . TEE WITHOUT CARDIOVERSION N/A 11/13/2013   Procedure: TRANSESOPHAGEAL ECHOCARDIOGRAM (TEE);  Surgeon: Mckayla Spark, MD;  Location: [facility];  Service: Cardiovascular;  Laterality: N/A;  . TEE WITHOUT CARDIOVERSION N/A 11/11/2016   Procedure: TRANSESOPHAGEAL ECHOCARDIOGRAM (TEE);  Surgeon: Josue Hector, MD;  Location: [facility] ENDOSCOPY;  Service: Cardiovascular;  Laterality: N/A;  .  TONSILLECTOMY     Social History   Occupational History  . Occupation: Retired  Tobacco Use  . Smoking status: Former Smoker    Packs/day: 1.00    Years: 60.00    Pack years: 60.00    Types: Cigarettes  . Smokeless tobacco: Never Used  Vaping Use  . Vaping Use: Never used  Substance and Sexual Activity  . Alcohol use: Yes    Comment: 01/03/2015 "might have a beer a couple times/yr"  . Drug use: No  . Sexual activity: Never

## 2019-08-23 DIAGNOSIS — I5032 Chronic diastolic (congestive) heart failure: Secondary | ICD-10-CM | POA: Diagnosis not present

## 2019-08-23 DIAGNOSIS — I10 Essential (primary) hypertension: Secondary | ICD-10-CM | POA: Diagnosis not present

## 2019-08-23 DIAGNOSIS — F0280 Dementia in other diseases classified elsewhere without behavioral disturbance: Secondary | ICD-10-CM | POA: Diagnosis not present

## 2019-08-23 DIAGNOSIS — I480 Paroxysmal atrial fibrillation: Secondary | ICD-10-CM | POA: Diagnosis not present

## 2019-08-30 ENCOUNTER — Ambulatory Visit: Payer: Medicare Other | Admitting: Student

## 2019-08-31 DIAGNOSIS — Z4789 Encounter for other orthopedic aftercare: Secondary | ICD-10-CM | POA: Diagnosis not present

## 2019-08-31 DIAGNOSIS — I509 Heart failure, unspecified: Secondary | ICD-10-CM | POA: Diagnosis not present

## 2019-08-31 DIAGNOSIS — S72142S Displaced intertrochanteric fracture of left femur, sequela: Secondary | ICD-10-CM | POA: Diagnosis not present

## 2019-08-31 DIAGNOSIS — M6281 Muscle weakness (generalized): Secondary | ICD-10-CM | POA: Diagnosis not present

## 2019-08-31 DIAGNOSIS — R1312 Dysphagia, oropharyngeal phase: Secondary | ICD-10-CM | POA: Diagnosis not present

## 2019-08-31 DIAGNOSIS — F0390 Unspecified dementia without behavioral disturbance: Secondary | ICD-10-CM | POA: Diagnosis not present

## 2019-08-31 DIAGNOSIS — R41841 Cognitive communication deficit: Secondary | ICD-10-CM | POA: Diagnosis not present

## 2019-08-31 DIAGNOSIS — I480 Paroxysmal atrial fibrillation: Secondary | ICD-10-CM | POA: Diagnosis not present

## 2019-08-31 DIAGNOSIS — R262 Difficulty in walking, not elsewhere classified: Secondary | ICD-10-CM | POA: Diagnosis not present

## 2019-09-02 DIAGNOSIS — I480 Paroxysmal atrial fibrillation: Secondary | ICD-10-CM | POA: Diagnosis not present

## 2019-09-02 DIAGNOSIS — M6281 Muscle weakness (generalized): Secondary | ICD-10-CM | POA: Diagnosis not present

## 2019-09-02 DIAGNOSIS — F0390 Unspecified dementia without behavioral disturbance: Secondary | ICD-10-CM | POA: Diagnosis not present

## 2019-09-02 DIAGNOSIS — I509 Heart failure, unspecified: Secondary | ICD-10-CM | POA: Diagnosis not present

## 2019-09-02 DIAGNOSIS — R41841 Cognitive communication deficit: Secondary | ICD-10-CM | POA: Diagnosis not present

## 2019-09-02 DIAGNOSIS — R262 Difficulty in walking, not elsewhere classified: Secondary | ICD-10-CM | POA: Diagnosis not present

## 2019-09-02 DIAGNOSIS — S72142S Displaced intertrochanteric fracture of left femur, sequela: Secondary | ICD-10-CM | POA: Diagnosis not present

## 2019-09-02 DIAGNOSIS — Z4789 Encounter for other orthopedic aftercare: Secondary | ICD-10-CM | POA: Diagnosis not present

## 2019-09-02 DIAGNOSIS — R1312 Dysphagia, oropharyngeal phase: Secondary | ICD-10-CM | POA: Diagnosis not present

## 2019-09-03 DIAGNOSIS — Z4789 Encounter for other orthopedic aftercare: Secondary | ICD-10-CM | POA: Diagnosis not present

## 2019-09-03 DIAGNOSIS — R1312 Dysphagia, oropharyngeal phase: Secondary | ICD-10-CM | POA: Diagnosis not present

## 2019-09-03 DIAGNOSIS — R262 Difficulty in walking, not elsewhere classified: Secondary | ICD-10-CM | POA: Diagnosis not present

## 2019-09-03 DIAGNOSIS — S72142S Displaced intertrochanteric fracture of left femur, sequela: Secondary | ICD-10-CM | POA: Diagnosis not present

## 2019-09-03 DIAGNOSIS — R41841 Cognitive communication deficit: Secondary | ICD-10-CM | POA: Diagnosis not present

## 2019-09-03 DIAGNOSIS — I480 Paroxysmal atrial fibrillation: Secondary | ICD-10-CM | POA: Diagnosis not present

## 2019-09-03 DIAGNOSIS — M6281 Muscle weakness (generalized): Secondary | ICD-10-CM | POA: Diagnosis not present

## 2019-09-03 DIAGNOSIS — I509 Heart failure, unspecified: Secondary | ICD-10-CM | POA: Diagnosis not present

## 2019-09-03 DIAGNOSIS — F0390 Unspecified dementia without behavioral disturbance: Secondary | ICD-10-CM | POA: Diagnosis not present

## 2019-09-04 DIAGNOSIS — I480 Paroxysmal atrial fibrillation: Secondary | ICD-10-CM | POA: Diagnosis not present

## 2019-09-04 DIAGNOSIS — M6281 Muscle weakness (generalized): Secondary | ICD-10-CM | POA: Diagnosis not present

## 2019-09-04 DIAGNOSIS — R262 Difficulty in walking, not elsewhere classified: Secondary | ICD-10-CM | POA: Diagnosis not present

## 2019-09-04 DIAGNOSIS — R1312 Dysphagia, oropharyngeal phase: Secondary | ICD-10-CM | POA: Diagnosis not present

## 2019-09-04 DIAGNOSIS — I509 Heart failure, unspecified: Secondary | ICD-10-CM | POA: Diagnosis not present

## 2019-09-04 DIAGNOSIS — S72142S Displaced intertrochanteric fracture of left femur, sequela: Secondary | ICD-10-CM | POA: Diagnosis not present

## 2019-09-04 DIAGNOSIS — Z4789 Encounter for other orthopedic aftercare: Secondary | ICD-10-CM | POA: Diagnosis not present

## 2019-09-04 DIAGNOSIS — F0390 Unspecified dementia without behavioral disturbance: Secondary | ICD-10-CM | POA: Diagnosis not present

## 2019-09-04 DIAGNOSIS — R41841 Cognitive communication deficit: Secondary | ICD-10-CM | POA: Diagnosis not present

## 2019-09-05 DIAGNOSIS — Z4789 Encounter for other orthopedic aftercare: Secondary | ICD-10-CM | POA: Diagnosis not present

## 2019-09-05 DIAGNOSIS — R41841 Cognitive communication deficit: Secondary | ICD-10-CM | POA: Diagnosis not present

## 2019-09-05 DIAGNOSIS — I509 Heart failure, unspecified: Secondary | ICD-10-CM | POA: Diagnosis not present

## 2019-09-05 DIAGNOSIS — I480 Paroxysmal atrial fibrillation: Secondary | ICD-10-CM | POA: Diagnosis not present

## 2019-09-05 DIAGNOSIS — R262 Difficulty in walking, not elsewhere classified: Secondary | ICD-10-CM | POA: Diagnosis not present

## 2019-09-05 DIAGNOSIS — S72142S Displaced intertrochanteric fracture of left femur, sequela: Secondary | ICD-10-CM | POA: Diagnosis not present

## 2019-09-05 DIAGNOSIS — F0390 Unspecified dementia without behavioral disturbance: Secondary | ICD-10-CM | POA: Diagnosis not present

## 2019-09-05 DIAGNOSIS — R1312 Dysphagia, oropharyngeal phase: Secondary | ICD-10-CM | POA: Diagnosis not present

## 2019-09-05 DIAGNOSIS — M6281 Muscle weakness (generalized): Secondary | ICD-10-CM | POA: Diagnosis not present

## 2019-09-06 DIAGNOSIS — M6281 Muscle weakness (generalized): Secondary | ICD-10-CM | POA: Diagnosis not present

## 2019-09-06 DIAGNOSIS — I480 Paroxysmal atrial fibrillation: Secondary | ICD-10-CM | POA: Diagnosis not present

## 2019-09-06 DIAGNOSIS — Z4789 Encounter for other orthopedic aftercare: Secondary | ICD-10-CM | POA: Diagnosis not present

## 2019-09-06 DIAGNOSIS — R1312 Dysphagia, oropharyngeal phase: Secondary | ICD-10-CM | POA: Diagnosis not present

## 2019-09-06 DIAGNOSIS — S72142S Displaced intertrochanteric fracture of left femur, sequela: Secondary | ICD-10-CM | POA: Diagnosis not present

## 2019-09-06 DIAGNOSIS — R41841 Cognitive communication deficit: Secondary | ICD-10-CM | POA: Diagnosis not present

## 2019-09-06 DIAGNOSIS — I509 Heart failure, unspecified: Secondary | ICD-10-CM | POA: Diagnosis not present

## 2019-09-06 DIAGNOSIS — F0390 Unspecified dementia without behavioral disturbance: Secondary | ICD-10-CM | POA: Diagnosis not present

## 2019-09-06 DIAGNOSIS — R262 Difficulty in walking, not elsewhere classified: Secondary | ICD-10-CM | POA: Diagnosis not present

## 2019-09-07 DIAGNOSIS — R41841 Cognitive communication deficit: Secondary | ICD-10-CM | POA: Diagnosis not present

## 2019-09-07 DIAGNOSIS — S72142S Displaced intertrochanteric fracture of left femur, sequela: Secondary | ICD-10-CM | POA: Diagnosis not present

## 2019-09-07 DIAGNOSIS — R1312 Dysphagia, oropharyngeal phase: Secondary | ICD-10-CM | POA: Diagnosis not present

## 2019-09-07 DIAGNOSIS — Z4789 Encounter for other orthopedic aftercare: Secondary | ICD-10-CM | POA: Diagnosis not present

## 2019-09-07 DIAGNOSIS — R262 Difficulty in walking, not elsewhere classified: Secondary | ICD-10-CM | POA: Diagnosis not present

## 2019-09-07 DIAGNOSIS — R634 Abnormal weight loss: Secondary | ICD-10-CM | POA: Diagnosis not present

## 2019-09-07 DIAGNOSIS — I509 Heart failure, unspecified: Secondary | ICD-10-CM | POA: Diagnosis not present

## 2019-09-07 DIAGNOSIS — L89620 Pressure ulcer of left heel, unstageable: Secondary | ICD-10-CM | POA: Diagnosis not present

## 2019-09-07 DIAGNOSIS — I2510 Atherosclerotic heart disease of native coronary artery without angina pectoris: Secondary | ICD-10-CM | POA: Diagnosis not present

## 2019-09-07 DIAGNOSIS — I480 Paroxysmal atrial fibrillation: Secondary | ICD-10-CM | POA: Diagnosis not present

## 2019-09-07 DIAGNOSIS — L97529 Non-pressure chronic ulcer of other part of left foot with unspecified severity: Secondary | ICD-10-CM | POA: Diagnosis not present

## 2019-09-07 DIAGNOSIS — F0390 Unspecified dementia without behavioral disturbance: Secondary | ICD-10-CM | POA: Diagnosis not present

## 2019-09-07 DIAGNOSIS — M6281 Muscle weakness (generalized): Secondary | ICD-10-CM | POA: Diagnosis not present

## 2019-09-10 DIAGNOSIS — I480 Paroxysmal atrial fibrillation: Secondary | ICD-10-CM | POA: Diagnosis not present

## 2019-09-10 DIAGNOSIS — I509 Heart failure, unspecified: Secondary | ICD-10-CM | POA: Diagnosis not present

## 2019-09-10 DIAGNOSIS — L89620 Pressure ulcer of left heel, unstageable: Secondary | ICD-10-CM | POA: Diagnosis not present

## 2019-09-10 DIAGNOSIS — R262 Difficulty in walking, not elsewhere classified: Secondary | ICD-10-CM | POA: Diagnosis not present

## 2019-09-10 DIAGNOSIS — R41841 Cognitive communication deficit: Secondary | ICD-10-CM | POA: Diagnosis not present

## 2019-09-10 DIAGNOSIS — F0390 Unspecified dementia without behavioral disturbance: Secondary | ICD-10-CM | POA: Diagnosis not present

## 2019-09-10 DIAGNOSIS — R1312 Dysphagia, oropharyngeal phase: Secondary | ICD-10-CM | POA: Diagnosis not present

## 2019-09-10 DIAGNOSIS — M6281 Muscle weakness (generalized): Secondary | ICD-10-CM | POA: Diagnosis not present

## 2019-09-10 DIAGNOSIS — S90415A Abrasion, left lesser toe(s), initial encounter: Secondary | ICD-10-CM | POA: Diagnosis not present

## 2019-09-10 DIAGNOSIS — Z4789 Encounter for other orthopedic aftercare: Secondary | ICD-10-CM | POA: Diagnosis not present

## 2019-09-10 DIAGNOSIS — S72142S Displaced intertrochanteric fracture of left femur, sequela: Secondary | ICD-10-CM | POA: Diagnosis not present

## 2019-09-11 DIAGNOSIS — S72142S Displaced intertrochanteric fracture of left femur, sequela: Secondary | ICD-10-CM | POA: Diagnosis not present

## 2019-09-11 DIAGNOSIS — F0390 Unspecified dementia without behavioral disturbance: Secondary | ICD-10-CM | POA: Diagnosis not present

## 2019-09-11 DIAGNOSIS — Z4789 Encounter for other orthopedic aftercare: Secondary | ICD-10-CM | POA: Diagnosis not present

## 2019-09-11 DIAGNOSIS — I480 Paroxysmal atrial fibrillation: Secondary | ICD-10-CM | POA: Diagnosis not present

## 2019-09-11 DIAGNOSIS — R262 Difficulty in walking, not elsewhere classified: Secondary | ICD-10-CM | POA: Diagnosis not present

## 2019-09-11 DIAGNOSIS — R41841 Cognitive communication deficit: Secondary | ICD-10-CM | POA: Diagnosis not present

## 2019-09-11 DIAGNOSIS — R1312 Dysphagia, oropharyngeal phase: Secondary | ICD-10-CM | POA: Diagnosis not present

## 2019-09-11 DIAGNOSIS — I509 Heart failure, unspecified: Secondary | ICD-10-CM | POA: Diagnosis not present

## 2019-09-11 DIAGNOSIS — M6281 Muscle weakness (generalized): Secondary | ICD-10-CM | POA: Diagnosis not present

## 2019-09-12 DIAGNOSIS — L89620 Pressure ulcer of left heel, unstageable: Secondary | ICD-10-CM | POA: Diagnosis not present

## 2019-09-13 ENCOUNTER — Ambulatory Visit: Payer: Medicare Other | Admitting: Orthopaedic Surgery

## 2019-09-14 DIAGNOSIS — R0989 Other specified symptoms and signs involving the circulatory and respiratory systems: Secondary | ICD-10-CM | POA: Diagnosis not present

## 2019-09-14 DIAGNOSIS — Z872 Personal history of diseases of the skin and subcutaneous tissue: Secondary | ICD-10-CM | POA: Diagnosis not present

## 2019-09-19 DIAGNOSIS — L97509 Non-pressure chronic ulcer of other part of unspecified foot with unspecified severity: Secondary | ICD-10-CM | POA: Diagnosis not present

## 2019-09-20 DIAGNOSIS — L89620 Pressure ulcer of left heel, unstageable: Secondary | ICD-10-CM | POA: Diagnosis not present

## 2019-09-25 DIAGNOSIS — I480 Paroxysmal atrial fibrillation: Secondary | ICD-10-CM | POA: Diagnosis not present

## 2019-09-25 DIAGNOSIS — Z8679 Personal history of other diseases of the circulatory system: Secondary | ICD-10-CM | POA: Diagnosis not present

## 2019-09-25 DIAGNOSIS — I10 Essential (primary) hypertension: Secondary | ICD-10-CM | POA: Diagnosis not present

## 2019-09-25 DIAGNOSIS — I2510 Atherosclerotic heart disease of native coronary artery without angina pectoris: Secondary | ICD-10-CM | POA: Diagnosis not present

## 2019-10-17 DIAGNOSIS — L89620 Pressure ulcer of left heel, unstageable: Secondary | ICD-10-CM | POA: Diagnosis not present

## 2019-10-18 DIAGNOSIS — F0390 Unspecified dementia without behavioral disturbance: Secondary | ICD-10-CM | POA: Diagnosis not present

## 2019-10-18 DIAGNOSIS — I480 Paroxysmal atrial fibrillation: Secondary | ICD-10-CM | POA: Diagnosis not present

## 2019-10-18 DIAGNOSIS — I5032 Chronic diastolic (congestive) heart failure: Secondary | ICD-10-CM | POA: Diagnosis not present

## 2019-10-18 DIAGNOSIS — I10 Essential (primary) hypertension: Secondary | ICD-10-CM | POA: Diagnosis not present

## 2019-10-19 DIAGNOSIS — R293 Abnormal posture: Secondary | ICD-10-CM | POA: Diagnosis not present

## 2019-10-19 DIAGNOSIS — R262 Difficulty in walking, not elsewhere classified: Secondary | ICD-10-CM | POA: Diagnosis not present

## 2019-10-19 DIAGNOSIS — R1312 Dysphagia, oropharyngeal phase: Secondary | ICD-10-CM | POA: Diagnosis not present

## 2019-10-19 DIAGNOSIS — I509 Heart failure, unspecified: Secondary | ICD-10-CM | POA: Diagnosis not present

## 2019-10-19 DIAGNOSIS — Z4789 Encounter for other orthopedic aftercare: Secondary | ICD-10-CM | POA: Diagnosis not present

## 2019-10-19 DIAGNOSIS — R41841 Cognitive communication deficit: Secondary | ICD-10-CM | POA: Diagnosis not present

## 2019-10-19 DIAGNOSIS — S72142S Displaced intertrochanteric fracture of left femur, sequela: Secondary | ICD-10-CM | POA: Diagnosis not present

## 2019-10-19 DIAGNOSIS — M6281 Muscle weakness (generalized): Secondary | ICD-10-CM | POA: Diagnosis not present

## 2019-10-19 DIAGNOSIS — I480 Paroxysmal atrial fibrillation: Secondary | ICD-10-CM | POA: Diagnosis not present

## 2019-10-19 DIAGNOSIS — R2689 Other abnormalities of gait and mobility: Secondary | ICD-10-CM | POA: Diagnosis not present

## 2019-10-19 DIAGNOSIS — F0390 Unspecified dementia without behavioral disturbance: Secondary | ICD-10-CM | POA: Diagnosis not present

## 2019-10-19 DIAGNOSIS — Z9181 History of falling: Secondary | ICD-10-CM | POA: Diagnosis not present

## 2019-10-22 DIAGNOSIS — Z9181 History of falling: Secondary | ICD-10-CM | POA: Diagnosis not present

## 2019-10-22 DIAGNOSIS — R41841 Cognitive communication deficit: Secondary | ICD-10-CM | POA: Diagnosis not present

## 2019-10-22 DIAGNOSIS — R262 Difficulty in walking, not elsewhere classified: Secondary | ICD-10-CM | POA: Diagnosis not present

## 2019-10-22 DIAGNOSIS — R2689 Other abnormalities of gait and mobility: Secondary | ICD-10-CM | POA: Diagnosis not present

## 2019-10-22 DIAGNOSIS — I480 Paroxysmal atrial fibrillation: Secondary | ICD-10-CM | POA: Diagnosis not present

## 2019-10-22 DIAGNOSIS — R293 Abnormal posture: Secondary | ICD-10-CM | POA: Diagnosis not present

## 2019-10-22 DIAGNOSIS — M6281 Muscle weakness (generalized): Secondary | ICD-10-CM | POA: Diagnosis not present

## 2019-10-22 DIAGNOSIS — R1312 Dysphagia, oropharyngeal phase: Secondary | ICD-10-CM | POA: Diagnosis not present

## 2019-10-22 DIAGNOSIS — S72142S Displaced intertrochanteric fracture of left femur, sequela: Secondary | ICD-10-CM | POA: Diagnosis not present

## 2019-10-22 DIAGNOSIS — Z4789 Encounter for other orthopedic aftercare: Secondary | ICD-10-CM | POA: Diagnosis not present

## 2019-10-22 DIAGNOSIS — I509 Heart failure, unspecified: Secondary | ICD-10-CM | POA: Diagnosis not present

## 2019-10-22 DIAGNOSIS — F0390 Unspecified dementia without behavioral disturbance: Secondary | ICD-10-CM | POA: Diagnosis not present

## 2019-10-23 DIAGNOSIS — R1312 Dysphagia, oropharyngeal phase: Secondary | ICD-10-CM | POA: Diagnosis not present

## 2019-10-23 DIAGNOSIS — Z9181 History of falling: Secondary | ICD-10-CM | POA: Diagnosis not present

## 2019-10-23 DIAGNOSIS — R2689 Other abnormalities of gait and mobility: Secondary | ICD-10-CM | POA: Diagnosis not present

## 2019-10-23 DIAGNOSIS — I5032 Chronic diastolic (congestive) heart failure: Secondary | ICD-10-CM | POA: Diagnosis not present

## 2019-10-23 DIAGNOSIS — I509 Heart failure, unspecified: Secondary | ICD-10-CM | POA: Diagnosis not present

## 2019-10-23 DIAGNOSIS — I10 Essential (primary) hypertension: Secondary | ICD-10-CM | POA: Diagnosis not present

## 2019-10-23 DIAGNOSIS — I480 Paroxysmal atrial fibrillation: Secondary | ICD-10-CM | POA: Diagnosis not present

## 2019-10-23 DIAGNOSIS — R262 Difficulty in walking, not elsewhere classified: Secondary | ICD-10-CM | POA: Diagnosis not present

## 2019-10-23 DIAGNOSIS — Z4789 Encounter for other orthopedic aftercare: Secondary | ICD-10-CM | POA: Diagnosis not present

## 2019-10-23 DIAGNOSIS — R41841 Cognitive communication deficit: Secondary | ICD-10-CM | POA: Diagnosis not present

## 2019-10-23 DIAGNOSIS — M6281 Muscle weakness (generalized): Secondary | ICD-10-CM | POA: Diagnosis not present

## 2019-10-23 DIAGNOSIS — S72142S Displaced intertrochanteric fracture of left femur, sequela: Secondary | ICD-10-CM | POA: Diagnosis not present

## 2019-10-23 DIAGNOSIS — R293 Abnormal posture: Secondary | ICD-10-CM | POA: Diagnosis not present

## 2019-10-23 DIAGNOSIS — F0280 Dementia in other diseases classified elsewhere without behavioral disturbance: Secondary | ICD-10-CM | POA: Diagnosis not present

## 2019-10-23 DIAGNOSIS — F0390 Unspecified dementia without behavioral disturbance: Secondary | ICD-10-CM | POA: Diagnosis not present

## 2019-10-24 DIAGNOSIS — N390 Urinary tract infection, site not specified: Secondary | ICD-10-CM | POA: Diagnosis not present

## 2019-10-24 DIAGNOSIS — F0390 Unspecified dementia without behavioral disturbance: Secondary | ICD-10-CM | POA: Diagnosis not present

## 2019-10-24 DIAGNOSIS — I509 Heart failure, unspecified: Secondary | ICD-10-CM | POA: Diagnosis not present

## 2019-10-24 DIAGNOSIS — Z9181 History of falling: Secondary | ICD-10-CM | POA: Diagnosis not present

## 2019-10-24 DIAGNOSIS — I480 Paroxysmal atrial fibrillation: Secondary | ICD-10-CM | POA: Diagnosis not present

## 2019-10-24 DIAGNOSIS — Z4789 Encounter for other orthopedic aftercare: Secondary | ICD-10-CM | POA: Diagnosis not present

## 2019-10-24 DIAGNOSIS — I10 Essential (primary) hypertension: Secondary | ICD-10-CM | POA: Diagnosis not present

## 2019-10-24 DIAGNOSIS — R2689 Other abnormalities of gait and mobility: Secondary | ICD-10-CM | POA: Diagnosis not present

## 2019-10-24 DIAGNOSIS — R293 Abnormal posture: Secondary | ICD-10-CM | POA: Diagnosis not present

## 2019-10-24 DIAGNOSIS — E039 Hypothyroidism, unspecified: Secondary | ICD-10-CM | POA: Diagnosis not present

## 2019-10-24 DIAGNOSIS — R41841 Cognitive communication deficit: Secondary | ICD-10-CM | POA: Diagnosis not present

## 2019-10-24 DIAGNOSIS — M6281 Muscle weakness (generalized): Secondary | ICD-10-CM | POA: Diagnosis not present

## 2019-10-24 DIAGNOSIS — R1312 Dysphagia, oropharyngeal phase: Secondary | ICD-10-CM | POA: Diagnosis not present

## 2019-10-24 DIAGNOSIS — R262 Difficulty in walking, not elsewhere classified: Secondary | ICD-10-CM | POA: Diagnosis not present

## 2019-10-24 DIAGNOSIS — S72142S Displaced intertrochanteric fracture of left femur, sequela: Secondary | ICD-10-CM | POA: Diagnosis not present

## 2019-10-24 DIAGNOSIS — D649 Anemia, unspecified: Secondary | ICD-10-CM | POA: Diagnosis not present

## 2019-10-25 DIAGNOSIS — F0390 Unspecified dementia without behavioral disturbance: Secondary | ICD-10-CM | POA: Diagnosis not present

## 2019-10-25 DIAGNOSIS — M6281 Muscle weakness (generalized): Secondary | ICD-10-CM | POA: Diagnosis not present

## 2019-10-25 DIAGNOSIS — Z9181 History of falling: Secondary | ICD-10-CM | POA: Diagnosis not present

## 2019-10-25 DIAGNOSIS — R1312 Dysphagia, oropharyngeal phase: Secondary | ICD-10-CM | POA: Diagnosis not present

## 2019-10-25 DIAGNOSIS — I480 Paroxysmal atrial fibrillation: Secondary | ICD-10-CM | POA: Diagnosis not present

## 2019-10-25 DIAGNOSIS — Z4789 Encounter for other orthopedic aftercare: Secondary | ICD-10-CM | POA: Diagnosis not present

## 2019-10-25 DIAGNOSIS — I509 Heart failure, unspecified: Secondary | ICD-10-CM | POA: Diagnosis not present

## 2019-10-25 DIAGNOSIS — R41841 Cognitive communication deficit: Secondary | ICD-10-CM | POA: Diagnosis not present

## 2019-10-25 DIAGNOSIS — R2689 Other abnormalities of gait and mobility: Secondary | ICD-10-CM | POA: Diagnosis not present

## 2019-10-25 DIAGNOSIS — S72142S Displaced intertrochanteric fracture of left femur, sequela: Secondary | ICD-10-CM | POA: Diagnosis not present

## 2019-10-25 DIAGNOSIS — R293 Abnormal posture: Secondary | ICD-10-CM | POA: Diagnosis not present

## 2019-10-25 DIAGNOSIS — R262 Difficulty in walking, not elsewhere classified: Secondary | ICD-10-CM | POA: Diagnosis not present

## 2019-10-26 DIAGNOSIS — Z9181 History of falling: Secondary | ICD-10-CM | POA: Diagnosis not present

## 2019-10-26 DIAGNOSIS — I480 Paroxysmal atrial fibrillation: Secondary | ICD-10-CM | POA: Diagnosis not present

## 2019-10-26 DIAGNOSIS — R41841 Cognitive communication deficit: Secondary | ICD-10-CM | POA: Diagnosis not present

## 2019-10-26 DIAGNOSIS — R2689 Other abnormalities of gait and mobility: Secondary | ICD-10-CM | POA: Diagnosis not present

## 2019-10-26 DIAGNOSIS — I509 Heart failure, unspecified: Secondary | ICD-10-CM | POA: Diagnosis not present

## 2019-10-26 DIAGNOSIS — S72142S Displaced intertrochanteric fracture of left femur, sequela: Secondary | ICD-10-CM | POA: Diagnosis not present

## 2019-10-26 DIAGNOSIS — M6281 Muscle weakness (generalized): Secondary | ICD-10-CM | POA: Diagnosis not present

## 2019-10-26 DIAGNOSIS — R1312 Dysphagia, oropharyngeal phase: Secondary | ICD-10-CM | POA: Diagnosis not present

## 2019-10-26 DIAGNOSIS — R262 Difficulty in walking, not elsewhere classified: Secondary | ICD-10-CM | POA: Diagnosis not present

## 2019-10-26 DIAGNOSIS — Z4789 Encounter for other orthopedic aftercare: Secondary | ICD-10-CM | POA: Diagnosis not present

## 2019-10-26 DIAGNOSIS — R293 Abnormal posture: Secondary | ICD-10-CM | POA: Diagnosis not present

## 2019-10-26 DIAGNOSIS — F0390 Unspecified dementia without behavioral disturbance: Secondary | ICD-10-CM | POA: Diagnosis not present

## 2019-10-29 DIAGNOSIS — S72142S Displaced intertrochanteric fracture of left femur, sequela: Secondary | ICD-10-CM | POA: Diagnosis not present

## 2019-10-29 DIAGNOSIS — I480 Paroxysmal atrial fibrillation: Secondary | ICD-10-CM | POA: Diagnosis not present

## 2019-10-29 DIAGNOSIS — Z9181 History of falling: Secondary | ICD-10-CM | POA: Diagnosis not present

## 2019-10-29 DIAGNOSIS — R1312 Dysphagia, oropharyngeal phase: Secondary | ICD-10-CM | POA: Diagnosis not present

## 2019-10-29 DIAGNOSIS — F0390 Unspecified dementia without behavioral disturbance: Secondary | ICD-10-CM | POA: Diagnosis not present

## 2019-10-29 DIAGNOSIS — Z4789 Encounter for other orthopedic aftercare: Secondary | ICD-10-CM | POA: Diagnosis not present

## 2019-10-29 DIAGNOSIS — M6281 Muscle weakness (generalized): Secondary | ICD-10-CM | POA: Diagnosis not present

## 2019-10-29 DIAGNOSIS — R2689 Other abnormalities of gait and mobility: Secondary | ICD-10-CM | POA: Diagnosis not present

## 2019-10-29 DIAGNOSIS — R262 Difficulty in walking, not elsewhere classified: Secondary | ICD-10-CM | POA: Diagnosis not present

## 2019-10-29 DIAGNOSIS — R41841 Cognitive communication deficit: Secondary | ICD-10-CM | POA: Diagnosis not present

## 2019-10-29 DIAGNOSIS — R293 Abnormal posture: Secondary | ICD-10-CM | POA: Diagnosis not present

## 2019-10-29 DIAGNOSIS — I509 Heart failure, unspecified: Secondary | ICD-10-CM | POA: Diagnosis not present

## 2019-10-30 DIAGNOSIS — Z9181 History of falling: Secondary | ICD-10-CM | POA: Diagnosis not present

## 2019-10-30 DIAGNOSIS — I509 Heart failure, unspecified: Secondary | ICD-10-CM | POA: Diagnosis not present

## 2019-10-30 DIAGNOSIS — Z4789 Encounter for other orthopedic aftercare: Secondary | ICD-10-CM | POA: Diagnosis not present

## 2019-10-30 DIAGNOSIS — I480 Paroxysmal atrial fibrillation: Secondary | ICD-10-CM | POA: Diagnosis not present

## 2019-10-30 DIAGNOSIS — M6281 Muscle weakness (generalized): Secondary | ICD-10-CM | POA: Diagnosis not present

## 2019-10-30 DIAGNOSIS — R2689 Other abnormalities of gait and mobility: Secondary | ICD-10-CM | POA: Diagnosis not present

## 2019-10-30 DIAGNOSIS — F0390 Unspecified dementia without behavioral disturbance: Secondary | ICD-10-CM | POA: Diagnosis not present

## 2019-10-30 DIAGNOSIS — S72142S Displaced intertrochanteric fracture of left femur, sequela: Secondary | ICD-10-CM | POA: Diagnosis not present

## 2019-10-30 DIAGNOSIS — R262 Difficulty in walking, not elsewhere classified: Secondary | ICD-10-CM | POA: Diagnosis not present

## 2019-10-30 DIAGNOSIS — R41841 Cognitive communication deficit: Secondary | ICD-10-CM | POA: Diagnosis not present

## 2019-10-30 DIAGNOSIS — R1312 Dysphagia, oropharyngeal phase: Secondary | ICD-10-CM | POA: Diagnosis not present

## 2019-10-30 DIAGNOSIS — R293 Abnormal posture: Secondary | ICD-10-CM | POA: Diagnosis not present

## 2019-10-31 DIAGNOSIS — R41841 Cognitive communication deficit: Secondary | ICD-10-CM | POA: Diagnosis not present

## 2019-10-31 DIAGNOSIS — R293 Abnormal posture: Secondary | ICD-10-CM | POA: Diagnosis not present

## 2019-10-31 DIAGNOSIS — Z9181 History of falling: Secondary | ICD-10-CM | POA: Diagnosis not present

## 2019-10-31 DIAGNOSIS — Z4789 Encounter for other orthopedic aftercare: Secondary | ICD-10-CM | POA: Diagnosis not present

## 2019-10-31 DIAGNOSIS — R1312 Dysphagia, oropharyngeal phase: Secondary | ICD-10-CM | POA: Diagnosis not present

## 2019-10-31 DIAGNOSIS — R262 Difficulty in walking, not elsewhere classified: Secondary | ICD-10-CM | POA: Diagnosis not present

## 2019-10-31 DIAGNOSIS — R2689 Other abnormalities of gait and mobility: Secondary | ICD-10-CM | POA: Diagnosis not present

## 2019-10-31 DIAGNOSIS — F0390 Unspecified dementia without behavioral disturbance: Secondary | ICD-10-CM | POA: Diagnosis not present

## 2019-10-31 DIAGNOSIS — I509 Heart failure, unspecified: Secondary | ICD-10-CM | POA: Diagnosis not present

## 2019-10-31 DIAGNOSIS — S72142S Displaced intertrochanteric fracture of left femur, sequela: Secondary | ICD-10-CM | POA: Diagnosis not present

## 2019-10-31 DIAGNOSIS — M6281 Muscle weakness (generalized): Secondary | ICD-10-CM | POA: Diagnosis not present

## 2019-10-31 DIAGNOSIS — I480 Paroxysmal atrial fibrillation: Secondary | ICD-10-CM | POA: Diagnosis not present

## 2019-11-01 DIAGNOSIS — M6281 Muscle weakness (generalized): Secondary | ICD-10-CM | POA: Diagnosis not present

## 2019-11-01 DIAGNOSIS — I480 Paroxysmal atrial fibrillation: Secondary | ICD-10-CM | POA: Diagnosis not present

## 2019-11-01 DIAGNOSIS — R2689 Other abnormalities of gait and mobility: Secondary | ICD-10-CM | POA: Diagnosis not present

## 2019-11-01 DIAGNOSIS — R41841 Cognitive communication deficit: Secondary | ICD-10-CM | POA: Diagnosis not present

## 2019-11-01 DIAGNOSIS — Z4789 Encounter for other orthopedic aftercare: Secondary | ICD-10-CM | POA: Diagnosis not present

## 2019-11-01 DIAGNOSIS — S72142S Displaced intertrochanteric fracture of left femur, sequela: Secondary | ICD-10-CM | POA: Diagnosis not present

## 2019-11-01 DIAGNOSIS — R262 Difficulty in walking, not elsewhere classified: Secondary | ICD-10-CM | POA: Diagnosis not present

## 2019-11-01 DIAGNOSIS — Z9181 History of falling: Secondary | ICD-10-CM | POA: Diagnosis not present

## 2019-11-01 DIAGNOSIS — R293 Abnormal posture: Secondary | ICD-10-CM | POA: Diagnosis not present

## 2019-11-01 DIAGNOSIS — R1312 Dysphagia, oropharyngeal phase: Secondary | ICD-10-CM | POA: Diagnosis not present

## 2019-11-01 DIAGNOSIS — I509 Heart failure, unspecified: Secondary | ICD-10-CM | POA: Diagnosis not present

## 2019-11-01 DIAGNOSIS — F0390 Unspecified dementia without behavioral disturbance: Secondary | ICD-10-CM | POA: Diagnosis not present

## 2019-11-02 DIAGNOSIS — S72142S Displaced intertrochanteric fracture of left femur, sequela: Secondary | ICD-10-CM | POA: Diagnosis not present

## 2019-11-02 DIAGNOSIS — R262 Difficulty in walking, not elsewhere classified: Secondary | ICD-10-CM | POA: Diagnosis not present

## 2019-11-02 DIAGNOSIS — Z9181 History of falling: Secondary | ICD-10-CM | POA: Diagnosis not present

## 2019-11-02 DIAGNOSIS — R293 Abnormal posture: Secondary | ICD-10-CM | POA: Diagnosis not present

## 2019-11-02 DIAGNOSIS — R41841 Cognitive communication deficit: Secondary | ICD-10-CM | POA: Diagnosis not present

## 2019-11-02 DIAGNOSIS — I509 Heart failure, unspecified: Secondary | ICD-10-CM | POA: Diagnosis not present

## 2019-11-02 DIAGNOSIS — R2689 Other abnormalities of gait and mobility: Secondary | ICD-10-CM | POA: Diagnosis not present

## 2019-11-02 DIAGNOSIS — Z4789 Encounter for other orthopedic aftercare: Secondary | ICD-10-CM | POA: Diagnosis not present

## 2019-11-02 DIAGNOSIS — I480 Paroxysmal atrial fibrillation: Secondary | ICD-10-CM | POA: Diagnosis not present

## 2019-11-02 DIAGNOSIS — M6281 Muscle weakness (generalized): Secondary | ICD-10-CM | POA: Diagnosis not present

## 2019-11-02 DIAGNOSIS — R1312 Dysphagia, oropharyngeal phase: Secondary | ICD-10-CM | POA: Diagnosis not present

## 2019-11-02 DIAGNOSIS — F0390 Unspecified dementia without behavioral disturbance: Secondary | ICD-10-CM | POA: Diagnosis not present

## 2019-11-05 DIAGNOSIS — S72142S Displaced intertrochanteric fracture of left femur, sequela: Secondary | ICD-10-CM | POA: Diagnosis not present

## 2019-11-05 DIAGNOSIS — R2689 Other abnormalities of gait and mobility: Secondary | ICD-10-CM | POA: Diagnosis not present

## 2019-11-05 DIAGNOSIS — I480 Paroxysmal atrial fibrillation: Secondary | ICD-10-CM | POA: Diagnosis not present

## 2019-11-05 DIAGNOSIS — I509 Heart failure, unspecified: Secondary | ICD-10-CM | POA: Diagnosis not present

## 2019-11-05 DIAGNOSIS — R1312 Dysphagia, oropharyngeal phase: Secondary | ICD-10-CM | POA: Diagnosis not present

## 2019-11-05 DIAGNOSIS — Z4789 Encounter for other orthopedic aftercare: Secondary | ICD-10-CM | POA: Diagnosis not present

## 2019-11-05 DIAGNOSIS — R262 Difficulty in walking, not elsewhere classified: Secondary | ICD-10-CM | POA: Diagnosis not present

## 2019-11-05 DIAGNOSIS — M6281 Muscle weakness (generalized): Secondary | ICD-10-CM | POA: Diagnosis not present

## 2019-11-05 DIAGNOSIS — R293 Abnormal posture: Secondary | ICD-10-CM | POA: Diagnosis not present

## 2019-11-05 DIAGNOSIS — F0390 Unspecified dementia without behavioral disturbance: Secondary | ICD-10-CM | POA: Diagnosis not present

## 2019-11-05 DIAGNOSIS — R41841 Cognitive communication deficit: Secondary | ICD-10-CM | POA: Diagnosis not present

## 2019-11-05 DIAGNOSIS — Z9181 History of falling: Secondary | ICD-10-CM | POA: Diagnosis not present

## 2019-11-06 DIAGNOSIS — R293 Abnormal posture: Secondary | ICD-10-CM | POA: Diagnosis not present

## 2019-11-06 DIAGNOSIS — I509 Heart failure, unspecified: Secondary | ICD-10-CM | POA: Diagnosis not present

## 2019-11-06 DIAGNOSIS — R41841 Cognitive communication deficit: Secondary | ICD-10-CM | POA: Diagnosis not present

## 2019-11-06 DIAGNOSIS — R2689 Other abnormalities of gait and mobility: Secondary | ICD-10-CM | POA: Diagnosis not present

## 2019-11-06 DIAGNOSIS — F0390 Unspecified dementia without behavioral disturbance: Secondary | ICD-10-CM | POA: Diagnosis not present

## 2019-11-06 DIAGNOSIS — Z4789 Encounter for other orthopedic aftercare: Secondary | ICD-10-CM | POA: Diagnosis not present

## 2019-11-06 DIAGNOSIS — R262 Difficulty in walking, not elsewhere classified: Secondary | ICD-10-CM | POA: Diagnosis not present

## 2019-11-06 DIAGNOSIS — M6281 Muscle weakness (generalized): Secondary | ICD-10-CM | POA: Diagnosis not present

## 2019-11-06 DIAGNOSIS — I480 Paroxysmal atrial fibrillation: Secondary | ICD-10-CM | POA: Diagnosis not present

## 2019-11-06 DIAGNOSIS — Z9181 History of falling: Secondary | ICD-10-CM | POA: Diagnosis not present

## 2019-11-06 DIAGNOSIS — S72142S Displaced intertrochanteric fracture of left femur, sequela: Secondary | ICD-10-CM | POA: Diagnosis not present

## 2019-11-06 DIAGNOSIS — R1312 Dysphagia, oropharyngeal phase: Secondary | ICD-10-CM | POA: Diagnosis not present

## 2019-11-07 DIAGNOSIS — R1312 Dysphagia, oropharyngeal phase: Secondary | ICD-10-CM | POA: Diagnosis not present

## 2019-11-07 DIAGNOSIS — Z9181 History of falling: Secondary | ICD-10-CM | POA: Diagnosis not present

## 2019-11-07 DIAGNOSIS — S72142S Displaced intertrochanteric fracture of left femur, sequela: Secondary | ICD-10-CM | POA: Diagnosis not present

## 2019-11-07 DIAGNOSIS — F0390 Unspecified dementia without behavioral disturbance: Secondary | ICD-10-CM | POA: Diagnosis not present

## 2019-11-07 DIAGNOSIS — R262 Difficulty in walking, not elsewhere classified: Secondary | ICD-10-CM | POA: Diagnosis not present

## 2019-11-07 DIAGNOSIS — R2689 Other abnormalities of gait and mobility: Secondary | ICD-10-CM | POA: Diagnosis not present

## 2019-11-07 DIAGNOSIS — M6281 Muscle weakness (generalized): Secondary | ICD-10-CM | POA: Diagnosis not present

## 2019-11-07 DIAGNOSIS — R41841 Cognitive communication deficit: Secondary | ICD-10-CM | POA: Diagnosis not present

## 2019-11-07 DIAGNOSIS — R293 Abnormal posture: Secondary | ICD-10-CM | POA: Diagnosis not present

## 2019-11-07 DIAGNOSIS — I480 Paroxysmal atrial fibrillation: Secondary | ICD-10-CM | POA: Diagnosis not present

## 2019-11-07 DIAGNOSIS — Z4789 Encounter for other orthopedic aftercare: Secondary | ICD-10-CM | POA: Diagnosis not present

## 2019-11-07 DIAGNOSIS — I509 Heart failure, unspecified: Secondary | ICD-10-CM | POA: Diagnosis not present

## 2019-11-08 DIAGNOSIS — I509 Heart failure, unspecified: Secondary | ICD-10-CM | POA: Diagnosis not present

## 2019-11-08 DIAGNOSIS — R293 Abnormal posture: Secondary | ICD-10-CM | POA: Diagnosis not present

## 2019-11-08 DIAGNOSIS — Z9181 History of falling: Secondary | ICD-10-CM | POA: Diagnosis not present

## 2019-11-08 DIAGNOSIS — M6281 Muscle weakness (generalized): Secondary | ICD-10-CM | POA: Diagnosis not present

## 2019-11-08 DIAGNOSIS — I480 Paroxysmal atrial fibrillation: Secondary | ICD-10-CM | POA: Diagnosis not present

## 2019-11-08 DIAGNOSIS — R41841 Cognitive communication deficit: Secondary | ICD-10-CM | POA: Diagnosis not present

## 2019-11-08 DIAGNOSIS — S72142S Displaced intertrochanteric fracture of left femur, sequela: Secondary | ICD-10-CM | POA: Diagnosis not present

## 2019-11-08 DIAGNOSIS — R1312 Dysphagia, oropharyngeal phase: Secondary | ICD-10-CM | POA: Diagnosis not present

## 2019-11-08 DIAGNOSIS — R2689 Other abnormalities of gait and mobility: Secondary | ICD-10-CM | POA: Diagnosis not present

## 2019-11-08 DIAGNOSIS — R262 Difficulty in walking, not elsewhere classified: Secondary | ICD-10-CM | POA: Diagnosis not present

## 2019-11-08 DIAGNOSIS — F0390 Unspecified dementia without behavioral disturbance: Secondary | ICD-10-CM | POA: Diagnosis not present

## 2019-11-08 DIAGNOSIS — Z4789 Encounter for other orthopedic aftercare: Secondary | ICD-10-CM | POA: Diagnosis not present

## 2019-11-09 DIAGNOSIS — R262 Difficulty in walking, not elsewhere classified: Secondary | ICD-10-CM | POA: Diagnosis not present

## 2019-11-09 DIAGNOSIS — I509 Heart failure, unspecified: Secondary | ICD-10-CM | POA: Diagnosis not present

## 2019-11-09 DIAGNOSIS — R1312 Dysphagia, oropharyngeal phase: Secondary | ICD-10-CM | POA: Diagnosis not present

## 2019-11-09 DIAGNOSIS — I480 Paroxysmal atrial fibrillation: Secondary | ICD-10-CM | POA: Diagnosis not present

## 2019-11-09 DIAGNOSIS — R293 Abnormal posture: Secondary | ICD-10-CM | POA: Diagnosis not present

## 2019-11-09 DIAGNOSIS — S72142S Displaced intertrochanteric fracture of left femur, sequela: Secondary | ICD-10-CM | POA: Diagnosis not present

## 2019-11-09 DIAGNOSIS — Z9181 History of falling: Secondary | ICD-10-CM | POA: Diagnosis not present

## 2019-11-09 DIAGNOSIS — M6281 Muscle weakness (generalized): Secondary | ICD-10-CM | POA: Diagnosis not present

## 2019-11-09 DIAGNOSIS — R41841 Cognitive communication deficit: Secondary | ICD-10-CM | POA: Diagnosis not present

## 2019-11-09 DIAGNOSIS — Z4789 Encounter for other orthopedic aftercare: Secondary | ICD-10-CM | POA: Diagnosis not present

## 2019-11-09 DIAGNOSIS — F0390 Unspecified dementia without behavioral disturbance: Secondary | ICD-10-CM | POA: Diagnosis not present

## 2019-11-09 DIAGNOSIS — R2689 Other abnormalities of gait and mobility: Secondary | ICD-10-CM | POA: Diagnosis not present

## 2019-11-12 ENCOUNTER — Other Ambulatory Visit: Payer: Self-pay

## 2019-11-12 DIAGNOSIS — M6281 Muscle weakness (generalized): Secondary | ICD-10-CM | POA: Diagnosis not present

## 2019-11-12 DIAGNOSIS — I509 Heart failure, unspecified: Secondary | ICD-10-CM | POA: Diagnosis not present

## 2019-11-12 DIAGNOSIS — F0390 Unspecified dementia without behavioral disturbance: Secondary | ICD-10-CM | POA: Diagnosis not present

## 2019-11-12 DIAGNOSIS — R2689 Other abnormalities of gait and mobility: Secondary | ICD-10-CM | POA: Diagnosis not present

## 2019-11-12 DIAGNOSIS — Z4789 Encounter for other orthopedic aftercare: Secondary | ICD-10-CM | POA: Diagnosis not present

## 2019-11-12 DIAGNOSIS — R1312 Dysphagia, oropharyngeal phase: Secondary | ICD-10-CM | POA: Diagnosis not present

## 2019-11-12 DIAGNOSIS — Z9181 History of falling: Secondary | ICD-10-CM | POA: Diagnosis not present

## 2019-11-12 DIAGNOSIS — I480 Paroxysmal atrial fibrillation: Secondary | ICD-10-CM | POA: Diagnosis not present

## 2019-11-12 DIAGNOSIS — R41841 Cognitive communication deficit: Secondary | ICD-10-CM | POA: Diagnosis not present

## 2019-11-12 DIAGNOSIS — R262 Difficulty in walking, not elsewhere classified: Secondary | ICD-10-CM | POA: Diagnosis not present

## 2019-11-12 DIAGNOSIS — R293 Abnormal posture: Secondary | ICD-10-CM | POA: Diagnosis not present

## 2019-11-12 DIAGNOSIS — L819 Disorder of pigmentation, unspecified: Secondary | ICD-10-CM

## 2019-11-12 DIAGNOSIS — S72142S Displaced intertrochanteric fracture of left femur, sequela: Secondary | ICD-10-CM | POA: Diagnosis not present

## 2019-11-13 DIAGNOSIS — R41841 Cognitive communication deficit: Secondary | ICD-10-CM | POA: Diagnosis not present

## 2019-11-13 DIAGNOSIS — R2689 Other abnormalities of gait and mobility: Secondary | ICD-10-CM | POA: Diagnosis not present

## 2019-11-13 DIAGNOSIS — I509 Heart failure, unspecified: Secondary | ICD-10-CM | POA: Diagnosis not present

## 2019-11-13 DIAGNOSIS — Z4789 Encounter for other orthopedic aftercare: Secondary | ICD-10-CM | POA: Diagnosis not present

## 2019-11-13 DIAGNOSIS — R293 Abnormal posture: Secondary | ICD-10-CM | POA: Diagnosis not present

## 2019-11-13 DIAGNOSIS — R262 Difficulty in walking, not elsewhere classified: Secondary | ICD-10-CM | POA: Diagnosis not present

## 2019-11-13 DIAGNOSIS — F0390 Unspecified dementia without behavioral disturbance: Secondary | ICD-10-CM | POA: Diagnosis not present

## 2019-11-13 DIAGNOSIS — I480 Paroxysmal atrial fibrillation: Secondary | ICD-10-CM | POA: Diagnosis not present

## 2019-11-13 DIAGNOSIS — S72142S Displaced intertrochanteric fracture of left femur, sequela: Secondary | ICD-10-CM | POA: Diagnosis not present

## 2019-11-13 DIAGNOSIS — Z9181 History of falling: Secondary | ICD-10-CM | POA: Diagnosis not present

## 2019-11-13 DIAGNOSIS — M6281 Muscle weakness (generalized): Secondary | ICD-10-CM | POA: Diagnosis not present

## 2019-11-13 DIAGNOSIS — R1312 Dysphagia, oropharyngeal phase: Secondary | ICD-10-CM | POA: Diagnosis not present

## 2019-11-14 DIAGNOSIS — Z9181 History of falling: Secondary | ICD-10-CM | POA: Diagnosis not present

## 2019-11-14 DIAGNOSIS — R1312 Dysphagia, oropharyngeal phase: Secondary | ICD-10-CM | POA: Diagnosis not present

## 2019-11-14 DIAGNOSIS — R2689 Other abnormalities of gait and mobility: Secondary | ICD-10-CM | POA: Diagnosis not present

## 2019-11-14 DIAGNOSIS — I480 Paroxysmal atrial fibrillation: Secondary | ICD-10-CM | POA: Diagnosis not present

## 2019-11-14 DIAGNOSIS — R293 Abnormal posture: Secondary | ICD-10-CM | POA: Diagnosis not present

## 2019-11-14 DIAGNOSIS — R262 Difficulty in walking, not elsewhere classified: Secondary | ICD-10-CM | POA: Diagnosis not present

## 2019-11-14 DIAGNOSIS — M6281 Muscle weakness (generalized): Secondary | ICD-10-CM | POA: Diagnosis not present

## 2019-11-14 DIAGNOSIS — R41841 Cognitive communication deficit: Secondary | ICD-10-CM | POA: Diagnosis not present

## 2019-11-14 DIAGNOSIS — F0390 Unspecified dementia without behavioral disturbance: Secondary | ICD-10-CM | POA: Diagnosis not present

## 2019-11-14 DIAGNOSIS — Z4789 Encounter for other orthopedic aftercare: Secondary | ICD-10-CM | POA: Diagnosis not present

## 2019-11-14 DIAGNOSIS — S72142S Displaced intertrochanteric fracture of left femur, sequela: Secondary | ICD-10-CM | POA: Diagnosis not present

## 2019-11-14 DIAGNOSIS — I509 Heart failure, unspecified: Secondary | ICD-10-CM | POA: Diagnosis not present

## 2019-11-15 DIAGNOSIS — M6281 Muscle weakness (generalized): Secondary | ICD-10-CM | POA: Diagnosis not present

## 2019-11-15 DIAGNOSIS — R41841 Cognitive communication deficit: Secondary | ICD-10-CM | POA: Diagnosis not present

## 2019-11-15 DIAGNOSIS — Z4789 Encounter for other orthopedic aftercare: Secondary | ICD-10-CM | POA: Diagnosis not present

## 2019-11-15 DIAGNOSIS — R2689 Other abnormalities of gait and mobility: Secondary | ICD-10-CM | POA: Diagnosis not present

## 2019-11-15 DIAGNOSIS — F0390 Unspecified dementia without behavioral disturbance: Secondary | ICD-10-CM | POA: Diagnosis not present

## 2019-11-15 DIAGNOSIS — R293 Abnormal posture: Secondary | ICD-10-CM | POA: Diagnosis not present

## 2019-11-15 DIAGNOSIS — I509 Heart failure, unspecified: Secondary | ICD-10-CM | POA: Diagnosis not present

## 2019-11-15 DIAGNOSIS — R1312 Dysphagia, oropharyngeal phase: Secondary | ICD-10-CM | POA: Diagnosis not present

## 2019-11-15 DIAGNOSIS — S72142S Displaced intertrochanteric fracture of left femur, sequela: Secondary | ICD-10-CM | POA: Diagnosis not present

## 2019-11-15 DIAGNOSIS — R262 Difficulty in walking, not elsewhere classified: Secondary | ICD-10-CM | POA: Diagnosis not present

## 2019-11-15 DIAGNOSIS — Z9181 History of falling: Secondary | ICD-10-CM | POA: Diagnosis not present

## 2019-11-15 DIAGNOSIS — I480 Paroxysmal atrial fibrillation: Secondary | ICD-10-CM | POA: Diagnosis not present

## 2019-11-16 DIAGNOSIS — I509 Heart failure, unspecified: Secondary | ICD-10-CM | POA: Diagnosis not present

## 2019-11-16 DIAGNOSIS — R41841 Cognitive communication deficit: Secondary | ICD-10-CM | POA: Diagnosis not present

## 2019-11-16 DIAGNOSIS — F0390 Unspecified dementia without behavioral disturbance: Secondary | ICD-10-CM | POA: Diagnosis not present

## 2019-11-16 DIAGNOSIS — M6281 Muscle weakness (generalized): Secondary | ICD-10-CM | POA: Diagnosis not present

## 2019-11-16 DIAGNOSIS — Z4789 Encounter for other orthopedic aftercare: Secondary | ICD-10-CM | POA: Diagnosis not present

## 2019-11-16 DIAGNOSIS — Z9181 History of falling: Secondary | ICD-10-CM | POA: Diagnosis not present

## 2019-11-16 DIAGNOSIS — I480 Paroxysmal atrial fibrillation: Secondary | ICD-10-CM | POA: Diagnosis not present

## 2019-11-16 DIAGNOSIS — S72142S Displaced intertrochanteric fracture of left femur, sequela: Secondary | ICD-10-CM | POA: Diagnosis not present

## 2019-11-16 DIAGNOSIS — R262 Difficulty in walking, not elsewhere classified: Secondary | ICD-10-CM | POA: Diagnosis not present

## 2019-11-16 DIAGNOSIS — R2689 Other abnormalities of gait and mobility: Secondary | ICD-10-CM | POA: Diagnosis not present

## 2019-11-16 DIAGNOSIS — R1312 Dysphagia, oropharyngeal phase: Secondary | ICD-10-CM | POA: Diagnosis not present

## 2019-11-16 DIAGNOSIS — R293 Abnormal posture: Secondary | ICD-10-CM | POA: Diagnosis not present

## 2019-11-16 DIAGNOSIS — L89620 Pressure ulcer of left heel, unstageable: Secondary | ICD-10-CM | POA: Diagnosis not present

## 2019-11-19 DIAGNOSIS — R262 Difficulty in walking, not elsewhere classified: Secondary | ICD-10-CM | POA: Diagnosis not present

## 2019-11-19 DIAGNOSIS — M6281 Muscle weakness (generalized): Secondary | ICD-10-CM | POA: Diagnosis not present

## 2019-11-19 DIAGNOSIS — R2689 Other abnormalities of gait and mobility: Secondary | ICD-10-CM | POA: Diagnosis not present

## 2019-11-19 DIAGNOSIS — S72142S Displaced intertrochanteric fracture of left femur, sequela: Secondary | ICD-10-CM | POA: Diagnosis not present

## 2019-11-19 DIAGNOSIS — Z9181 History of falling: Secondary | ICD-10-CM | POA: Diagnosis not present

## 2019-11-19 DIAGNOSIS — R41841 Cognitive communication deficit: Secondary | ICD-10-CM | POA: Diagnosis not present

## 2019-11-19 DIAGNOSIS — I480 Paroxysmal atrial fibrillation: Secondary | ICD-10-CM | POA: Diagnosis not present

## 2019-11-19 DIAGNOSIS — I509 Heart failure, unspecified: Secondary | ICD-10-CM | POA: Diagnosis not present

## 2019-11-19 DIAGNOSIS — Z4789 Encounter for other orthopedic aftercare: Secondary | ICD-10-CM | POA: Diagnosis not present

## 2019-11-19 DIAGNOSIS — R1312 Dysphagia, oropharyngeal phase: Secondary | ICD-10-CM | POA: Diagnosis not present

## 2019-11-19 DIAGNOSIS — R293 Abnormal posture: Secondary | ICD-10-CM | POA: Diagnosis not present

## 2019-11-19 DIAGNOSIS — F0390 Unspecified dementia without behavioral disturbance: Secondary | ICD-10-CM | POA: Diagnosis not present

## 2019-11-20 DIAGNOSIS — R293 Abnormal posture: Secondary | ICD-10-CM | POA: Diagnosis not present

## 2019-11-20 DIAGNOSIS — Z4789 Encounter for other orthopedic aftercare: Secondary | ICD-10-CM | POA: Diagnosis not present

## 2019-11-20 DIAGNOSIS — I509 Heart failure, unspecified: Secondary | ICD-10-CM | POA: Diagnosis not present

## 2019-11-20 DIAGNOSIS — R41841 Cognitive communication deficit: Secondary | ICD-10-CM | POA: Diagnosis not present

## 2019-11-20 DIAGNOSIS — Z9181 History of falling: Secondary | ICD-10-CM | POA: Diagnosis not present

## 2019-11-20 DIAGNOSIS — F0390 Unspecified dementia without behavioral disturbance: Secondary | ICD-10-CM | POA: Diagnosis not present

## 2019-11-20 DIAGNOSIS — R262 Difficulty in walking, not elsewhere classified: Secondary | ICD-10-CM | POA: Diagnosis not present

## 2019-11-20 DIAGNOSIS — S72142S Displaced intertrochanteric fracture of left femur, sequela: Secondary | ICD-10-CM | POA: Diagnosis not present

## 2019-11-20 DIAGNOSIS — I480 Paroxysmal atrial fibrillation: Secondary | ICD-10-CM | POA: Diagnosis not present

## 2019-11-20 DIAGNOSIS — M6281 Muscle weakness (generalized): Secondary | ICD-10-CM | POA: Diagnosis not present

## 2019-11-20 DIAGNOSIS — R2689 Other abnormalities of gait and mobility: Secondary | ICD-10-CM | POA: Diagnosis not present

## 2019-11-20 DIAGNOSIS — R1312 Dysphagia, oropharyngeal phase: Secondary | ICD-10-CM | POA: Diagnosis not present

## 2019-11-21 DIAGNOSIS — R262 Difficulty in walking, not elsewhere classified: Secondary | ICD-10-CM | POA: Diagnosis not present

## 2019-11-21 DIAGNOSIS — M6281 Muscle weakness (generalized): Secondary | ICD-10-CM | POA: Diagnosis not present

## 2019-11-21 DIAGNOSIS — S72142S Displaced intertrochanteric fracture of left femur, sequela: Secondary | ICD-10-CM | POA: Diagnosis not present

## 2019-11-21 DIAGNOSIS — R293 Abnormal posture: Secondary | ICD-10-CM | POA: Diagnosis not present

## 2019-11-21 DIAGNOSIS — I480 Paroxysmal atrial fibrillation: Secondary | ICD-10-CM | POA: Diagnosis not present

## 2019-11-21 DIAGNOSIS — Z9181 History of falling: Secondary | ICD-10-CM | POA: Diagnosis not present

## 2019-11-21 DIAGNOSIS — Z4789 Encounter for other orthopedic aftercare: Secondary | ICD-10-CM | POA: Diagnosis not present

## 2019-11-21 DIAGNOSIS — F0390 Unspecified dementia without behavioral disturbance: Secondary | ICD-10-CM | POA: Diagnosis not present

## 2019-11-21 DIAGNOSIS — R1312 Dysphagia, oropharyngeal phase: Secondary | ICD-10-CM | POA: Diagnosis not present

## 2019-11-21 DIAGNOSIS — I509 Heart failure, unspecified: Secondary | ICD-10-CM | POA: Diagnosis not present

## 2019-11-21 DIAGNOSIS — R2689 Other abnormalities of gait and mobility: Secondary | ICD-10-CM | POA: Diagnosis not present

## 2019-11-21 DIAGNOSIS — R41841 Cognitive communication deficit: Secondary | ICD-10-CM | POA: Diagnosis not present

## 2019-11-22 DIAGNOSIS — Z9181 History of falling: Secondary | ICD-10-CM | POA: Diagnosis not present

## 2019-11-22 DIAGNOSIS — I480 Paroxysmal atrial fibrillation: Secondary | ICD-10-CM | POA: Diagnosis not present

## 2019-11-22 DIAGNOSIS — Z4789 Encounter for other orthopedic aftercare: Secondary | ICD-10-CM | POA: Diagnosis not present

## 2019-11-22 DIAGNOSIS — S72142S Displaced intertrochanteric fracture of left femur, sequela: Secondary | ICD-10-CM | POA: Diagnosis not present

## 2019-11-22 DIAGNOSIS — R1312 Dysphagia, oropharyngeal phase: Secondary | ICD-10-CM | POA: Diagnosis not present

## 2019-11-22 DIAGNOSIS — I509 Heart failure, unspecified: Secondary | ICD-10-CM | POA: Diagnosis not present

## 2019-11-22 DIAGNOSIS — L89890 Pressure ulcer of other site, unstageable: Secondary | ICD-10-CM | POA: Diagnosis not present

## 2019-11-22 DIAGNOSIS — R293 Abnormal posture: Secondary | ICD-10-CM | POA: Diagnosis not present

## 2019-11-22 DIAGNOSIS — M6281 Muscle weakness (generalized): Secondary | ICD-10-CM | POA: Diagnosis not present

## 2019-11-22 DIAGNOSIS — R2689 Other abnormalities of gait and mobility: Secondary | ICD-10-CM | POA: Diagnosis not present

## 2019-11-22 DIAGNOSIS — F0390 Unspecified dementia without behavioral disturbance: Secondary | ICD-10-CM | POA: Diagnosis not present

## 2019-11-22 DIAGNOSIS — R41841 Cognitive communication deficit: Secondary | ICD-10-CM | POA: Diagnosis not present

## 2019-11-22 DIAGNOSIS — R262 Difficulty in walking, not elsewhere classified: Secondary | ICD-10-CM | POA: Diagnosis not present

## 2019-11-23 DIAGNOSIS — R2689 Other abnormalities of gait and mobility: Secondary | ICD-10-CM | POA: Diagnosis not present

## 2019-11-23 DIAGNOSIS — I480 Paroxysmal atrial fibrillation: Secondary | ICD-10-CM | POA: Diagnosis not present

## 2019-11-23 DIAGNOSIS — R1312 Dysphagia, oropharyngeal phase: Secondary | ICD-10-CM | POA: Diagnosis not present

## 2019-11-23 DIAGNOSIS — M6281 Muscle weakness (generalized): Secondary | ICD-10-CM | POA: Diagnosis not present

## 2019-11-23 DIAGNOSIS — F0390 Unspecified dementia without behavioral disturbance: Secondary | ICD-10-CM | POA: Diagnosis not present

## 2019-11-23 DIAGNOSIS — Z9181 History of falling: Secondary | ICD-10-CM | POA: Diagnosis not present

## 2019-11-23 DIAGNOSIS — I509 Heart failure, unspecified: Secondary | ICD-10-CM | POA: Diagnosis not present

## 2019-11-23 DIAGNOSIS — R41841 Cognitive communication deficit: Secondary | ICD-10-CM | POA: Diagnosis not present

## 2019-11-23 DIAGNOSIS — S72142S Displaced intertrochanteric fracture of left femur, sequela: Secondary | ICD-10-CM | POA: Diagnosis not present

## 2019-11-23 DIAGNOSIS — R293 Abnormal posture: Secondary | ICD-10-CM | POA: Diagnosis not present

## 2019-11-23 DIAGNOSIS — Z4789 Encounter for other orthopedic aftercare: Secondary | ICD-10-CM | POA: Diagnosis not present

## 2019-11-23 DIAGNOSIS — R262 Difficulty in walking, not elsewhere classified: Secondary | ICD-10-CM | POA: Diagnosis not present

## 2019-11-26 ENCOUNTER — Other Ambulatory Visit: Payer: Self-pay

## 2019-11-26 ENCOUNTER — Ambulatory Visit: Payer: Medicare Other | Admitting: Surgery

## 2019-11-26 ENCOUNTER — Encounter: Payer: Self-pay | Admitting: Surgery

## 2019-11-26 ENCOUNTER — Ambulatory Visit (HOSPITAL_COMMUNITY)
Admission: RE | Admit: 2019-11-26 | Discharge: 2019-11-26 | Disposition: A | Discharge status: Home / Self Care | Payer: Medicare Other | Source: Ambulatory Visit | Attending: Surgery | Admitting: Surgery

## 2019-11-26 VITALS — BP 98/57 | Temp 97.2°F

## 2019-11-26 DIAGNOSIS — R262 Difficulty in walking, not elsewhere classified: Secondary | ICD-10-CM | POA: Diagnosis not present

## 2019-11-26 DIAGNOSIS — F0390 Unspecified dementia without behavioral disturbance: Secondary | ICD-10-CM | POA: Diagnosis not present

## 2019-11-26 DIAGNOSIS — I480 Paroxysmal atrial fibrillation: Secondary | ICD-10-CM | POA: Diagnosis not present

## 2019-11-26 DIAGNOSIS — I7025 Atherosclerosis of native arteries of other extremities with ulceration: Secondary | ICD-10-CM

## 2019-11-26 DIAGNOSIS — Z9181 History of falling: Secondary | ICD-10-CM | POA: Diagnosis not present

## 2019-11-26 DIAGNOSIS — L819 Disorder of pigmentation, unspecified: Secondary | ICD-10-CM | POA: Diagnosis not present

## 2019-11-26 DIAGNOSIS — M6281 Muscle weakness (generalized): Secondary | ICD-10-CM | POA: Diagnosis not present

## 2019-11-26 DIAGNOSIS — R293 Abnormal posture: Secondary | ICD-10-CM | POA: Diagnosis not present

## 2019-11-26 DIAGNOSIS — S72142S Displaced intertrochanteric fracture of left femur, sequela: Secondary | ICD-10-CM | POA: Diagnosis not present

## 2019-11-26 DIAGNOSIS — Z4789 Encounter for other orthopedic aftercare: Secondary | ICD-10-CM | POA: Diagnosis not present

## 2019-11-26 DIAGNOSIS — I509 Heart failure, unspecified: Secondary | ICD-10-CM | POA: Diagnosis not present

## 2019-11-26 DIAGNOSIS — R2689 Other abnormalities of gait and mobility: Secondary | ICD-10-CM | POA: Diagnosis not present

## 2019-11-26 DIAGNOSIS — R1312 Dysphagia, oropharyngeal phase: Secondary | ICD-10-CM | POA: Diagnosis not present

## 2019-11-26 DIAGNOSIS — R41841 Cognitive communication deficit: Secondary | ICD-10-CM | POA: Diagnosis not present

## 2019-11-26 NOTE — H&P (View-Only) (Signed)
[facility] Specialist of [facility]  Patient name: Cheryl Zamora MRN: [id number] DOB: [birth year] Sex: female   REQUESTING PROVIDER:    Dr. Keenan Bachelor   REASON FOR CONSULT:    Left toe and heel ulcer  HISTORY OF PRESENT ILLNESS:   Cheryl Zamora is a 86 y.o. female, who is here for evaluation of a left toe and heel ulcer which is been present since June.  She has been receiving wound care without significant improvement.  Her nephew, her power of attorney is here with her.   She is status post left intertrochanteric IM nail for a fall in May of 2021.  She is medically managed for hypertension.  She has congestive heart failure.  She is status post CABG in 2012.  PAST MEDICAL HISTORY    Past Medical History:  Diagnosis Date   Atrial fibrillation with rapid ventricular response ([place]) 11/2012; 10/2013   a. s/p TEE/DCCV 11/2012 & 10/2013 b. amiodarone caused significant QT prlongation (>631msec)   Chronic combined systolic and diastolic CHF (congestive heart failure) ([place])    Coronary artery disease 2005   a. s/p CABG (2012)   GERD (gastroesophageal reflux disease)    Hyperlipidemia    pt denies this hx on 01/03/2015   Hypertension    Neuropathy    Osteoarthritis of back      FAMILY HISTORY   Family History  Problem Relation Age of Onset   Heart attack Mother    Heart attack Father    Heart attack Brother    Heart attack Brother    Heart attack Brother    Heart attack Brother     SOCIAL HISTORY:   Social History   Socioeconomic History   Marital status: Legally Separated    Spouse name: Not on file   Number of children: Not on file   Years of education: Not on file   Highest education level: Not on file  Occupational History   Occupation: Retired  Tobacco Use   Smoking status: Former Smoker    Packs/day: 1.00    Years: 60.00    Pack years: 60.00    Types: Cigarettes   Smokeless tobacco: Never Used    Scientific laboratory technician Use: Never used  Substance and Sexual Activity   Alcohol use: Yes    Comment: 01/03/2015 "might have a beer a couple times/yr"   Drug use: No   Sexual activity: Never  Other Topics Concern   Not on file  Social History Narrative   Pt son helps with her care.   Social Determinants of Health   Financial Resource Strain:    Difficulty of Paying Living Expenses: Not on file  Food Insecurity:    Worried About Charity fundraiser in the Last Year: Not on file   [employer] of Food in the Last Year: Not on file  Transportation Needs:    Lack of Transportation (Medical): Not on file   Lack of Transportation (Non-Medical): Not on file  Physical Activity:    Days of Exercise per Week: Not on file   Minutes of Exercise per Session: Not on file  Stress:    Feeling of Stress : Not on file  Social Connections:    Frequency of Communication with Friends and Family: Not on file   Frequency of Social Gatherings with Friends and Family: Not on file   Attends Religious Services: Not on file   Active Member of Clubs or Organizations: Not on file  Attends Archivist Meetings: Not on file   Marital Status: Not on file  Intimate Partner Violence:    Fear of Current or Ex-Partner: Not on file   Emotionally Abused: Not on file   Physically Abused: Not on file   Sexually Abused: Not on file    ALLERGIES:    Allergies  Allergen Reactions   Digoxin And Related Other (See Comments)    Increased HR   Amiodarone Other (See Comments)    Excessive QT prolongation, nausea   Codeine Nausea And Vomiting   Penicillins Nausea Only    Did it involve swelling of the face/tongue/throat, SOB, or low BP? Unknown Did it involve sudden or severe rash/hives, skin peeling, or any reaction on the inside of your mouth or nose? Unknown Did you need to seek medical attention at a hospital or doctor's office? Unknown When did it last happen? If all  above answers are NO, may proceed with cephalosporin use.    CURRENT MEDICATIONS:    Current Outpatient Medications  Medication Sig Dispense Refill   acetaminophen (TYLENOL) 325 MG tablet Take 650 mg by mouth every 6 (six) hours as needed for mild pain.     Amino Acids-Protein Hydrolys (FEEDING SUPPLEMENT, PRO-STAT SUGAR FREE 64,) LIQD Take 30 mLs by mouth daily.     Cholecalciferol (VITAMIN D3) 1000 units CAPS Take 1,000 Units by mouth daily.     enoxaparin (LOVENOX) 40 MG/0.4ML injection Inject 0.4 mLs (40 mg total) into the skin daily. 0.4 mL 13   HYDROcodone-acetaminophen (NORCO) 5-325 MG tablet Take 1-2 tablets by mouth 3 (three) times daily as needed. 30 tablet 0   memantine (NAMENDA) 10 MG tablet Take 10 mg by mouth daily.      metoprolol tartrate (LOPRESSOR) 25 MG tablet Take 0.5 tablets (12.5 mg total) by mouth 2 (two) times daily. 60 tablet 0   Multiple Vitamin (MULTIVITAMIN) tablet Take 1 tablet by mouth daily.     nystatin (NYSTATIN) powder Apply 1 application topically every evening.     No current facility-administered medications for this visit.    REVIEW OF SYSTEMS:   [X]  denotes positive finding, [ ]  denotes negative finding Cardiac  Comments:  Chest pain or chest pressure:    Shortness of breath upon exertion:    Short of breath when lying flat:    Irregular heart rhythm:        Vascular    Pain in calf, thigh, or hip brought on by ambulation: x   Pain in feet at night that wakes you up from your sleep:  x   Blood clot in your veins:    Leg swelling:         Pulmonary    Oxygen at home:    Productive cough:     Wheezing:         Neurologic    Sudden weakness in arms or legs:     Sudden numbness in arms or legs:     Sudden onset of difficulty speaking or slurred speech:    Temporary loss of vision in one eye:     Problems with dizziness:         Gastrointestinal    Blood in stool:      Vomited blood:         Genitourinary    Burning  when urinating:     Blood in urine:        Psychiatric    Major depression:  Hematologic    Bleeding problems:    Problems with blood clotting too easily:        Skin    Rashes or ulcers:        Constitutional    Fever or chills:     PHYSICAL EXAM:   Vitals:   11/26/19 1202  BP: (!) 98/57  Temp: (!) 97.2 F (36.2 C)    GENERAL: The patient is a well-nourished female, in no acute distress. The vital signs are documented above. CARDIAC: There is a regular rate and rhythm.  VASCULAR: Pedal pulses PULMONARY: Nonlabored respirations MUSCULOSKELETAL: There are no major deformities or cyanosis. NEUROLOGIC: No focal weakness or paresthesias are detected. SKIN: Ischemic changes to the left third toe and heel with ulceration PSYCHIATRIC: The patient has a normal affect.  STUDIES:   I have reviewed the following duplex studies: Right: Resting right ankle-brachial index indicates noncompressible right  lower extremity arteries. The right toe-brachial index is abnormal. RT  great toe pressure = 32 mmHg.   Left: Resting left ankle-brachial index indicates mild left lower  extremity arterial disease. The left toe-brachial index is abnormal. LT  Great toe pressure = 23 mmHg.  Although ankle brachial indices are within normal limits (0.95-1.29),  arterial Doppler waveforms at the ankle suggest some component of arterial  occlusive disease.   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  The patient and nephew (power of attorney) want to do everything possible to avoid amputation.  I presented 3 options.  The first would be primary amputation.  The second would be continue wound care, understanding that this will not heal on its own without revascularization, and this will ultimately lead to amputation.  The third would be angiography to better evaluate her blood flow and  intervene if necessary.  This would give her the only chance for limb salvage.  Without any hesitation, despite her age they wish to proceed with angiography and potential revascularization.  I discussed coming from the right groin and intervening on the left leg if indicated.  Even with revascularization the patient is still at risk for amputation.  We discussed the risk of bleeding.  All questions were answered.  This is been scheduled for Tuesday, October 12   Leia Alf, MD, FACS [facility] [phone] Pager [phone]

## 2019-11-26 NOTE — Progress Notes (Signed)
Vascular and Vein Specialist of [facility]  Patient name: Cheryl Zamora MRN: [id number] DOB: 1923-12-02 Sex: female   REQUESTING PROVIDER:    Dr. Keenan Bachelor   REASON FOR CONSULT:    Left toe and heel ulcer  HISTORY OF PRESENT ILLNESS:   Cheryl Zamora is a 86 y.o. female, who is here for evaluation of a left toe and heel ulcer which is been present since June.  She has been receiving wound care without significant improvement.  Her nephew, her power of attorney is here with her.   She is status post left intertrochanteric IM nail for a fall in May of 2021.  She is medically managed for hypertension.  She has congestive heart failure.  She is status post CABG in 2012.  PAST MEDICAL HISTORY    Past Medical History:  Diagnosis Date  . Atrial fibrillation with rapid ventricular response ([place]) 11/2012; 10/2013   a. s/p TEE/DCCV 11/2012 & 10/2013 b. amiodarone caused significant QT prlongation (>636msec)  . Chronic combined systolic and diastolic CHF (congestive heart failure) ([place])   . Coronary artery disease 2005   a. s/p CABG (2012)  . GERD (gastroesophageal reflux disease)   . Hyperlipidemia    pt denies this hx on 01/03/2015  . Hypertension   . Neuropathy   . Osteoarthritis of back      FAMILY HISTORY   Family History  Problem Relation Age of Onset  . Heart attack Mother   . Heart attack Father   . Heart attack Brother   . Heart attack Brother   . Heart attack Brother   . Heart attack Brother     SOCIAL HISTORY:   Social History   Socioeconomic History  . Marital status: Legally Separated    Spouse name: Not on file  . Number of children: Not on file  . Years of education: Not on file  . Highest education level: Not on file  Occupational History  . Occupation: Retired  Tobacco Use  . Smoking status: Former Smoker    Packs/day: 1.00    Years: 60.00    Pack years: 60.00    Types: Cigarettes  . Smokeless tobacco: Never Used    Vaping Use  . Vaping Use: Never used  Substance and Sexual Activity  . Alcohol use: Yes    Comment: 01/03/2015 "might have a beer a couple times/yr"  . Drug use: No  . Sexual activity: Never  Other Topics Concern  . Not on file  Social History Narrative   Pt son helps with her care.   Social Determinants of Health   Financial Resource Strain:   . Difficulty of Paying Living Expenses: Not on file  Food Insecurity:   . Worried About Charity fundraiser in the Last Year: Not on file  . Ran Out of Food in the Last Year: Not on file  Transportation Needs:   . Lack of Transportation (Medical): Not on file  . Lack of Transportation (Non-Medical): Not on file  Physical Activity:   . Days of Exercise per Week: Not on file  . Minutes of Exercise per Session: Not on file  Stress:   . Feeling of Stress : Not on file  Social Connections:   . Frequency of Communication with Friends and Family: Not on file  . Frequency of Social Gatherings with Friends and Family: Not on file  . Attends Religious Services: Not on file  . Active Member of Clubs or Organizations: Not on file  .  Attends Archivist Meetings: Not on file  . Marital Status: Not on file  Intimate Partner Violence:   . Fear of Current or Ex-Partner: Not on file  . Emotionally Abused: Not on file  . Physically Abused: Not on file  . Sexually Abused: Not on file    ALLERGIES:    Allergies  Allergen Reactions  . Digoxin And Related Other (See Comments)    Increased HR  . Amiodarone Other (See Comments)    Excessive QT prolongation, nausea  . Codeine Nausea And Vomiting  . Penicillins Nausea Only    Did it involve swelling of the face/tongue/throat, SOB, or low BP? Unknown Did it involve sudden or severe rash/hives, skin peeling, or any reaction on the inside of your mouth or nose? Unknown Did you need to seek medical attention at a hospital or doctor's office? Unknown When did it last happen? If all  above answers are "NO", may proceed with cephalosporin use.    CURRENT MEDICATIONS:    Current Outpatient Medications  Medication Sig Dispense Refill  . acetaminophen (TYLENOL) 325 MG tablet Take 650 mg by mouth every 6 (six) hours as needed for mild pain.    . Amino Acids-Protein Hydrolys (FEEDING SUPPLEMENT, PRO-STAT SUGAR FREE 64,) LIQD Take 30 mLs by mouth daily.    . Cholecalciferol (VITAMIN D3) 1000 units CAPS Take 1,000 Units by mouth daily.    Marland Kitchen enoxaparin (LOVENOX) 40 MG/0.4ML injection Inject 0.4 mLs (40 mg total) into the skin daily. 0.4 mL 13  . HYDROcodone-acetaminophen (NORCO) 5-325 MG tablet Take 1-2 tablets by mouth 3 (three) times daily as needed. 30 tablet 0  . memantine (NAMENDA) 10 MG tablet Take 10 mg by mouth daily.     . metoprolol tartrate (LOPRESSOR) 25 MG tablet Take 0.5 tablets (12.5 mg total) by mouth 2 (two) times daily. 60 tablet 0  . Multiple Vitamin (MULTIVITAMIN) tablet Take 1 tablet by mouth daily.    Marland Kitchen nystatin (NYSTATIN) powder Apply 1 application topically every evening.     No current facility-administered medications for this visit.    REVIEW OF SYSTEMS:   [X]  denotes positive finding, [ ]  denotes negative finding Cardiac  Comments:  Chest pain or chest pressure:    Shortness of breath upon exertion:    Short of breath when lying flat:    Irregular heart rhythm:        Vascular    Pain in calf, thigh, or hip brought on by ambulation: x   Pain in feet at night that wakes you up from your sleep:  x   Blood clot in your veins:    Leg swelling:         Pulmonary    Oxygen at home:    Productive cough:     Wheezing:         Neurologic    Sudden weakness in arms or legs:     Sudden numbness in arms or legs:     Sudden onset of difficulty speaking or slurred speech:    Temporary loss of vision in one eye:     Problems with dizziness:         Gastrointestinal    Blood in stool:      Vomited blood:         Genitourinary    Burning  when urinating:     Blood in urine:        Psychiatric    Major depression:  Hematologic    Bleeding problems:    Problems with blood clotting too easily:        Skin    Rashes or ulcers:        Constitutional    Fever or chills:     PHYSICAL EXAM:   Vitals:   11/26/19 1202  BP: (!) 98/57  Temp: (!) 97.2 F (36.2 C)    GENERAL: The patient is a well-nourished female, in no acute distress. The vital signs are documented above. CARDIAC: There is a regular rate and rhythm.  VASCULAR: Pedal pulses PULMONARY: Nonlabored respirations MUSCULOSKELETAL: There are no major deformities or cyanosis. NEUROLOGIC: No focal weakness or paresthesias are detected. SKIN: Ischemic changes to the left third toe and heel with ulceration PSYCHIATRIC: The patient has a normal affect.  STUDIES:   I have reviewed the following duplex studies: Right: Resting right ankle-brachial index indicates noncompressible right  lower extremity arteries. The right toe-brachial index is abnormal. RT  great toe pressure = 32 mmHg.   Left: Resting left ankle-brachial index indicates mild left lower  extremity arterial disease. The left toe-brachial index is abnormal. LT  Great toe pressure = 23 mmHg.  Although ankle brachial indices are within normal limits (0.95-1.29),  arterial Doppler waveforms at the ankle suggest some component of arterial  occlusive disease.   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  The patient and nephew (power of attorney) want to do everything possible to avoid amputation.  I presented 3 options.  The first would be primary amputation.  The second would be continue wound care, understanding that this will not heal on its own without revascularization, and this will ultimately lead to amputation.  The third would be angiography to better evaluate her blood flow and  intervene if necessary.  This would give her the only chance for limb salvage.  Without any hesitation, despite her age they wish to proceed with angiography and potential revascularization.  I discussed coming from the right groin and intervening on the left leg if indicated.  Even with revascularization the patient is still at risk for amputation.  We discussed the risk of bleeding.  All questions were answered.  This is been scheduled for Tuesday, October 12   Leia Alf, MD, FACS Vascular and Vein Specialists of [facility] [phone] Pager [phone]

## 2019-11-27 DIAGNOSIS — S72142S Displaced intertrochanteric fracture of left femur, sequela: Secondary | ICD-10-CM | POA: Diagnosis not present

## 2019-11-27 DIAGNOSIS — R2689 Other abnormalities of gait and mobility: Secondary | ICD-10-CM | POA: Diagnosis not present

## 2019-11-27 DIAGNOSIS — Z4789 Encounter for other orthopedic aftercare: Secondary | ICD-10-CM | POA: Diagnosis not present

## 2019-11-27 DIAGNOSIS — F0390 Unspecified dementia without behavioral disturbance: Secondary | ICD-10-CM | POA: Diagnosis not present

## 2019-11-27 DIAGNOSIS — R293 Abnormal posture: Secondary | ICD-10-CM | POA: Diagnosis not present

## 2019-11-27 DIAGNOSIS — R41841 Cognitive communication deficit: Secondary | ICD-10-CM | POA: Diagnosis not present

## 2019-11-27 DIAGNOSIS — R1312 Dysphagia, oropharyngeal phase: Secondary | ICD-10-CM | POA: Diagnosis not present

## 2019-11-27 DIAGNOSIS — Z9181 History of falling: Secondary | ICD-10-CM | POA: Diagnosis not present

## 2019-11-27 DIAGNOSIS — R262 Difficulty in walking, not elsewhere classified: Secondary | ICD-10-CM | POA: Diagnosis not present

## 2019-11-27 DIAGNOSIS — I509 Heart failure, unspecified: Secondary | ICD-10-CM | POA: Diagnosis not present

## 2019-11-27 DIAGNOSIS — M6281 Muscle weakness (generalized): Secondary | ICD-10-CM | POA: Diagnosis not present

## 2019-11-27 DIAGNOSIS — I480 Paroxysmal atrial fibrillation: Secondary | ICD-10-CM | POA: Diagnosis not present

## 2019-11-28 DIAGNOSIS — R262 Difficulty in walking, not elsewhere classified: Secondary | ICD-10-CM | POA: Diagnosis not present

## 2019-11-28 DIAGNOSIS — F0390 Unspecified dementia without behavioral disturbance: Secondary | ICD-10-CM | POA: Diagnosis not present

## 2019-11-28 DIAGNOSIS — I509 Heart failure, unspecified: Secondary | ICD-10-CM | POA: Diagnosis not present

## 2019-11-28 DIAGNOSIS — Z9181 History of falling: Secondary | ICD-10-CM | POA: Diagnosis not present

## 2019-11-28 DIAGNOSIS — R293 Abnormal posture: Secondary | ICD-10-CM | POA: Diagnosis not present

## 2019-11-28 DIAGNOSIS — R2689 Other abnormalities of gait and mobility: Secondary | ICD-10-CM | POA: Diagnosis not present

## 2019-11-28 DIAGNOSIS — M6281 Muscle weakness (generalized): Secondary | ICD-10-CM | POA: Diagnosis not present

## 2019-11-28 DIAGNOSIS — R1312 Dysphagia, oropharyngeal phase: Secondary | ICD-10-CM | POA: Diagnosis not present

## 2019-11-28 DIAGNOSIS — I480 Paroxysmal atrial fibrillation: Secondary | ICD-10-CM | POA: Diagnosis not present

## 2019-11-28 DIAGNOSIS — Z4789 Encounter for other orthopedic aftercare: Secondary | ICD-10-CM | POA: Diagnosis not present

## 2019-11-28 DIAGNOSIS — S72142S Displaced intertrochanteric fracture of left femur, sequela: Secondary | ICD-10-CM | POA: Diagnosis not present

## 2019-11-28 DIAGNOSIS — R41841 Cognitive communication deficit: Secondary | ICD-10-CM | POA: Diagnosis not present

## 2019-11-29 ENCOUNTER — Other Ambulatory Visit: Payer: Self-pay

## 2019-11-29 DIAGNOSIS — R262 Difficulty in walking, not elsewhere classified: Secondary | ICD-10-CM | POA: Diagnosis not present

## 2019-11-29 DIAGNOSIS — R1312 Dysphagia, oropharyngeal phase: Secondary | ICD-10-CM | POA: Diagnosis not present

## 2019-11-29 DIAGNOSIS — M6281 Muscle weakness (generalized): Secondary | ICD-10-CM | POA: Diagnosis not present

## 2019-11-29 DIAGNOSIS — I509 Heart failure, unspecified: Secondary | ICD-10-CM | POA: Diagnosis not present

## 2019-11-29 DIAGNOSIS — Z9181 History of falling: Secondary | ICD-10-CM | POA: Diagnosis not present

## 2019-11-29 DIAGNOSIS — R293 Abnormal posture: Secondary | ICD-10-CM | POA: Diagnosis not present

## 2019-11-29 DIAGNOSIS — Z4789 Encounter for other orthopedic aftercare: Secondary | ICD-10-CM | POA: Diagnosis not present

## 2019-11-29 DIAGNOSIS — R41841 Cognitive communication deficit: Secondary | ICD-10-CM | POA: Diagnosis not present

## 2019-11-29 DIAGNOSIS — I480 Paroxysmal atrial fibrillation: Secondary | ICD-10-CM | POA: Diagnosis not present

## 2019-11-29 DIAGNOSIS — R2689 Other abnormalities of gait and mobility: Secondary | ICD-10-CM | POA: Diagnosis not present

## 2019-11-29 DIAGNOSIS — S72142S Displaced intertrochanteric fracture of left femur, sequela: Secondary | ICD-10-CM | POA: Diagnosis not present

## 2019-11-29 DIAGNOSIS — F0390 Unspecified dementia without behavioral disturbance: Secondary | ICD-10-CM | POA: Diagnosis not present

## 2019-11-30 DIAGNOSIS — R1312 Dysphagia, oropharyngeal phase: Secondary | ICD-10-CM | POA: Diagnosis not present

## 2019-11-30 DIAGNOSIS — Z4789 Encounter for other orthopedic aftercare: Secondary | ICD-10-CM | POA: Diagnosis not present

## 2019-11-30 DIAGNOSIS — R2689 Other abnormalities of gait and mobility: Secondary | ICD-10-CM | POA: Diagnosis not present

## 2019-11-30 DIAGNOSIS — I509 Heart failure, unspecified: Secondary | ICD-10-CM | POA: Diagnosis not present

## 2019-11-30 DIAGNOSIS — S72142S Displaced intertrochanteric fracture of left femur, sequela: Secondary | ICD-10-CM | POA: Diagnosis not present

## 2019-11-30 DIAGNOSIS — R41841 Cognitive communication deficit: Secondary | ICD-10-CM | POA: Diagnosis not present

## 2019-11-30 DIAGNOSIS — F0390 Unspecified dementia without behavioral disturbance: Secondary | ICD-10-CM | POA: Diagnosis not present

## 2019-11-30 DIAGNOSIS — R262 Difficulty in walking, not elsewhere classified: Secondary | ICD-10-CM | POA: Diagnosis not present

## 2019-11-30 DIAGNOSIS — M6281 Muscle weakness (generalized): Secondary | ICD-10-CM | POA: Diagnosis not present

## 2019-11-30 DIAGNOSIS — Z9181 History of falling: Secondary | ICD-10-CM | POA: Diagnosis not present

## 2019-11-30 DIAGNOSIS — I480 Paroxysmal atrial fibrillation: Secondary | ICD-10-CM | POA: Diagnosis not present

## 2019-11-30 DIAGNOSIS — R293 Abnormal posture: Secondary | ICD-10-CM | POA: Diagnosis not present

## 2019-12-03 ENCOUNTER — Telehealth: Payer: Self-pay

## 2019-12-03 DIAGNOSIS — R293 Abnormal posture: Secondary | ICD-10-CM | POA: Diagnosis not present

## 2019-12-03 DIAGNOSIS — R41841 Cognitive communication deficit: Secondary | ICD-10-CM | POA: Diagnosis not present

## 2019-12-03 DIAGNOSIS — I480 Paroxysmal atrial fibrillation: Secondary | ICD-10-CM | POA: Diagnosis not present

## 2019-12-03 DIAGNOSIS — S72142S Displaced intertrochanteric fracture of left femur, sequela: Secondary | ICD-10-CM | POA: Diagnosis not present

## 2019-12-03 DIAGNOSIS — R2689 Other abnormalities of gait and mobility: Secondary | ICD-10-CM | POA: Diagnosis not present

## 2019-12-03 DIAGNOSIS — Z4789 Encounter for other orthopedic aftercare: Secondary | ICD-10-CM | POA: Diagnosis not present

## 2019-12-03 DIAGNOSIS — R262 Difficulty in walking, not elsewhere classified: Secondary | ICD-10-CM | POA: Diagnosis not present

## 2019-12-03 DIAGNOSIS — M6281 Muscle weakness (generalized): Secondary | ICD-10-CM | POA: Diagnosis not present

## 2019-12-03 DIAGNOSIS — I509 Heart failure, unspecified: Secondary | ICD-10-CM | POA: Diagnosis not present

## 2019-12-03 DIAGNOSIS — R1312 Dysphagia, oropharyngeal phase: Secondary | ICD-10-CM | POA: Diagnosis not present

## 2019-12-03 DIAGNOSIS — Z9181 History of falling: Secondary | ICD-10-CM | POA: Diagnosis not present

## 2019-12-03 DIAGNOSIS — F0390 Unspecified dementia without behavioral disturbance: Secondary | ICD-10-CM | POA: Diagnosis not present

## 2019-12-03 NOTE — Telephone Encounter (Signed)
Patient's daughter Lattie Haw called with questions regarding Xarelto. Pharmacy wanted RX written for loose tabs instead of starter packs. Prescription changed to loose tablets. It was $166/month and there were concerns about that as a monthly expense. Told daughter about [facility] only charging $10 for [employer]. Also, to inquire at pharmacy about savings card options. Instructed to call back if they have further issues obtaining RX.

## 2019-12-04 DIAGNOSIS — R1312 Dysphagia, oropharyngeal phase: Secondary | ICD-10-CM | POA: Diagnosis not present

## 2019-12-04 DIAGNOSIS — M6281 Muscle weakness (generalized): Secondary | ICD-10-CM | POA: Diagnosis not present

## 2019-12-04 DIAGNOSIS — I509 Heart failure, unspecified: Secondary | ICD-10-CM | POA: Diagnosis not present

## 2019-12-04 DIAGNOSIS — F0390 Unspecified dementia without behavioral disturbance: Secondary | ICD-10-CM | POA: Diagnosis not present

## 2019-12-04 DIAGNOSIS — R2689 Other abnormalities of gait and mobility: Secondary | ICD-10-CM | POA: Diagnosis not present

## 2019-12-04 DIAGNOSIS — Z4789 Encounter for other orthopedic aftercare: Secondary | ICD-10-CM | POA: Diagnosis not present

## 2019-12-04 DIAGNOSIS — R41841 Cognitive communication deficit: Secondary | ICD-10-CM | POA: Diagnosis not present

## 2019-12-04 DIAGNOSIS — R262 Difficulty in walking, not elsewhere classified: Secondary | ICD-10-CM | POA: Diagnosis not present

## 2019-12-04 DIAGNOSIS — R293 Abnormal posture: Secondary | ICD-10-CM | POA: Diagnosis not present

## 2019-12-04 DIAGNOSIS — S72142S Displaced intertrochanteric fracture of left femur, sequela: Secondary | ICD-10-CM | POA: Diagnosis not present

## 2019-12-04 DIAGNOSIS — I480 Paroxysmal atrial fibrillation: Secondary | ICD-10-CM | POA: Diagnosis not present

## 2019-12-04 DIAGNOSIS — Z9181 History of falling: Secondary | ICD-10-CM | POA: Diagnosis not present

## 2019-12-05 DIAGNOSIS — R1312 Dysphagia, oropharyngeal phase: Secondary | ICD-10-CM | POA: Diagnosis not present

## 2019-12-05 DIAGNOSIS — F0390 Unspecified dementia without behavioral disturbance: Secondary | ICD-10-CM | POA: Diagnosis not present

## 2019-12-05 DIAGNOSIS — I480 Paroxysmal atrial fibrillation: Secondary | ICD-10-CM | POA: Diagnosis not present

## 2019-12-05 DIAGNOSIS — Z4789 Encounter for other orthopedic aftercare: Secondary | ICD-10-CM | POA: Diagnosis not present

## 2019-12-05 DIAGNOSIS — R262 Difficulty in walking, not elsewhere classified: Secondary | ICD-10-CM | POA: Diagnosis not present

## 2019-12-05 DIAGNOSIS — R41841 Cognitive communication deficit: Secondary | ICD-10-CM | POA: Diagnosis not present

## 2019-12-05 DIAGNOSIS — R293 Abnormal posture: Secondary | ICD-10-CM | POA: Diagnosis not present

## 2019-12-05 DIAGNOSIS — M6281 Muscle weakness (generalized): Secondary | ICD-10-CM | POA: Diagnosis not present

## 2019-12-05 DIAGNOSIS — Z9181 History of falling: Secondary | ICD-10-CM | POA: Diagnosis not present

## 2019-12-05 DIAGNOSIS — S72142S Displaced intertrochanteric fracture of left femur, sequela: Secondary | ICD-10-CM | POA: Diagnosis not present

## 2019-12-05 DIAGNOSIS — I509 Heart failure, unspecified: Secondary | ICD-10-CM | POA: Diagnosis not present

## 2019-12-05 DIAGNOSIS — R2689 Other abnormalities of gait and mobility: Secondary | ICD-10-CM | POA: Diagnosis not present

## 2019-12-06 DIAGNOSIS — F0390 Unspecified dementia without behavioral disturbance: Secondary | ICD-10-CM | POA: Diagnosis not present

## 2019-12-06 DIAGNOSIS — R293 Abnormal posture: Secondary | ICD-10-CM | POA: Diagnosis not present

## 2019-12-06 DIAGNOSIS — L89620 Pressure ulcer of left heel, unstageable: Secondary | ICD-10-CM | POA: Diagnosis not present

## 2019-12-06 DIAGNOSIS — I509 Heart failure, unspecified: Secondary | ICD-10-CM | POA: Diagnosis not present

## 2019-12-06 DIAGNOSIS — R41841 Cognitive communication deficit: Secondary | ICD-10-CM | POA: Diagnosis not present

## 2019-12-06 DIAGNOSIS — Z9181 History of falling: Secondary | ICD-10-CM | POA: Diagnosis not present

## 2019-12-06 DIAGNOSIS — R1312 Dysphagia, oropharyngeal phase: Secondary | ICD-10-CM | POA: Diagnosis not present

## 2019-12-06 DIAGNOSIS — M6281 Muscle weakness (generalized): Secondary | ICD-10-CM | POA: Diagnosis not present

## 2019-12-06 DIAGNOSIS — R262 Difficulty in walking, not elsewhere classified: Secondary | ICD-10-CM | POA: Diagnosis not present

## 2019-12-06 DIAGNOSIS — L89890 Pressure ulcer of other site, unstageable: Secondary | ICD-10-CM | POA: Diagnosis not present

## 2019-12-06 DIAGNOSIS — S72142S Displaced intertrochanteric fracture of left femur, sequela: Secondary | ICD-10-CM | POA: Diagnosis not present

## 2019-12-06 DIAGNOSIS — R2689 Other abnormalities of gait and mobility: Secondary | ICD-10-CM | POA: Diagnosis not present

## 2019-12-06 DIAGNOSIS — Z4789 Encounter for other orthopedic aftercare: Secondary | ICD-10-CM | POA: Diagnosis not present

## 2019-12-06 DIAGNOSIS — I480 Paroxysmal atrial fibrillation: Secondary | ICD-10-CM | POA: Diagnosis not present

## 2019-12-07 DIAGNOSIS — S72142S Displaced intertrochanteric fracture of left femur, sequela: Secondary | ICD-10-CM | POA: Diagnosis not present

## 2019-12-07 DIAGNOSIS — I480 Paroxysmal atrial fibrillation: Secondary | ICD-10-CM | POA: Diagnosis not present

## 2019-12-07 DIAGNOSIS — R2689 Other abnormalities of gait and mobility: Secondary | ICD-10-CM | POA: Diagnosis not present

## 2019-12-07 DIAGNOSIS — R262 Difficulty in walking, not elsewhere classified: Secondary | ICD-10-CM | POA: Diagnosis not present

## 2019-12-07 DIAGNOSIS — Z4789 Encounter for other orthopedic aftercare: Secondary | ICD-10-CM | POA: Diagnosis not present

## 2019-12-07 DIAGNOSIS — I509 Heart failure, unspecified: Secondary | ICD-10-CM | POA: Diagnosis not present

## 2019-12-07 DIAGNOSIS — R293 Abnormal posture: Secondary | ICD-10-CM | POA: Diagnosis not present

## 2019-12-07 DIAGNOSIS — R41841 Cognitive communication deficit: Secondary | ICD-10-CM | POA: Diagnosis not present

## 2019-12-07 DIAGNOSIS — R1312 Dysphagia, oropharyngeal phase: Secondary | ICD-10-CM | POA: Diagnosis not present

## 2019-12-07 DIAGNOSIS — M6281 Muscle weakness (generalized): Secondary | ICD-10-CM | POA: Diagnosis not present

## 2019-12-07 DIAGNOSIS — F0390 Unspecified dementia without behavioral disturbance: Secondary | ICD-10-CM | POA: Diagnosis not present

## 2019-12-07 DIAGNOSIS — Z9181 History of falling: Secondary | ICD-10-CM | POA: Diagnosis not present

## 2019-12-10 DIAGNOSIS — R2689 Other abnormalities of gait and mobility: Secondary | ICD-10-CM | POA: Diagnosis not present

## 2019-12-10 DIAGNOSIS — I480 Paroxysmal atrial fibrillation: Secondary | ICD-10-CM | POA: Diagnosis not present

## 2019-12-10 DIAGNOSIS — S72142S Displaced intertrochanteric fracture of left femur, sequela: Secondary | ICD-10-CM | POA: Diagnosis not present

## 2019-12-10 DIAGNOSIS — I509 Heart failure, unspecified: Secondary | ICD-10-CM | POA: Diagnosis not present

## 2019-12-10 DIAGNOSIS — F0390 Unspecified dementia without behavioral disturbance: Secondary | ICD-10-CM | POA: Diagnosis not present

## 2019-12-10 DIAGNOSIS — R41841 Cognitive communication deficit: Secondary | ICD-10-CM | POA: Diagnosis not present

## 2019-12-10 DIAGNOSIS — Z4789 Encounter for other orthopedic aftercare: Secondary | ICD-10-CM | POA: Diagnosis not present

## 2019-12-10 DIAGNOSIS — R293 Abnormal posture: Secondary | ICD-10-CM | POA: Diagnosis not present

## 2019-12-10 DIAGNOSIS — R262 Difficulty in walking, not elsewhere classified: Secondary | ICD-10-CM | POA: Diagnosis not present

## 2019-12-10 DIAGNOSIS — M6281 Muscle weakness (generalized): Secondary | ICD-10-CM | POA: Diagnosis not present

## 2019-12-10 DIAGNOSIS — R1312 Dysphagia, oropharyngeal phase: Secondary | ICD-10-CM | POA: Diagnosis not present

## 2019-12-10 DIAGNOSIS — Z9181 History of falling: Secondary | ICD-10-CM | POA: Diagnosis not present

## 2019-12-11 ENCOUNTER — Encounter (HOSPITAL_COMMUNITY)
Admission: RE | Disposition: A | Discharge status: Home / Self Care | Payer: Self-pay | Source: Home / Self Care | Attending: Surgery

## 2019-12-11 ENCOUNTER — Ambulatory Visit (HOSPITAL_COMMUNITY)
Admission: RE | Admit: 2019-12-11 | Discharge: 2019-12-11 | Disposition: A | Discharge status: Home / Self Care | Payer: Medicare Other | Attending: Surgery | Admitting: Surgery

## 2019-12-11 ENCOUNTER — Other Ambulatory Visit: Payer: Self-pay

## 2019-12-11 DIAGNOSIS — I509 Heart failure, unspecified: Secondary | ICD-10-CM | POA: Diagnosis not present

## 2019-12-11 DIAGNOSIS — R262 Difficulty in walking, not elsewhere classified: Secondary | ICD-10-CM | POA: Diagnosis not present

## 2019-12-11 DIAGNOSIS — Z79899 Other long term (current) drug therapy: Secondary | ICD-10-CM | POA: Insufficient documentation

## 2019-12-11 DIAGNOSIS — Z87891 Personal history of nicotine dependence: Secondary | ICD-10-CM | POA: Diagnosis not present

## 2019-12-11 DIAGNOSIS — Z888 Allergy status to other drugs, medicaments and biological substances status: Secondary | ICD-10-CM | POA: Insufficient documentation

## 2019-12-11 DIAGNOSIS — Z4789 Encounter for other orthopedic aftercare: Secondary | ICD-10-CM | POA: Diagnosis not present

## 2019-12-11 DIAGNOSIS — I2510 Atherosclerotic heart disease of native coronary artery without angina pectoris: Secondary | ICD-10-CM | POA: Diagnosis not present

## 2019-12-11 DIAGNOSIS — Z20822 Contact with and (suspected) exposure to covid-19: Secondary | ICD-10-CM | POA: Insufficient documentation

## 2019-12-11 DIAGNOSIS — S72142S Displaced intertrochanteric fracture of left femur, sequela: Secondary | ICD-10-CM | POA: Diagnosis not present

## 2019-12-11 DIAGNOSIS — R41841 Cognitive communication deficit: Secondary | ICD-10-CM | POA: Diagnosis not present

## 2019-12-11 DIAGNOSIS — I70244 Atherosclerosis of native arteries of left leg with ulceration of heel and midfoot: Secondary | ICD-10-CM | POA: Insufficient documentation

## 2019-12-11 DIAGNOSIS — L97529 Non-pressure chronic ulcer of other part of left foot with unspecified severity: Secondary | ICD-10-CM | POA: Diagnosis not present

## 2019-12-11 DIAGNOSIS — M6281 Muscle weakness (generalized): Secondary | ICD-10-CM | POA: Diagnosis not present

## 2019-12-11 DIAGNOSIS — I5042 Chronic combined systolic (congestive) and diastolic (congestive) heart failure: Secondary | ICD-10-CM | POA: Diagnosis not present

## 2019-12-11 DIAGNOSIS — I110 Hypertensive heart disease with heart failure: Secondary | ICD-10-CM | POA: Diagnosis not present

## 2019-12-11 DIAGNOSIS — R2689 Other abnormalities of gait and mobility: Secondary | ICD-10-CM | POA: Diagnosis not present

## 2019-12-11 DIAGNOSIS — Z880 Allergy status to penicillin: Secondary | ICD-10-CM | POA: Insufficient documentation

## 2019-12-11 DIAGNOSIS — I4819 Other persistent atrial fibrillation: Secondary | ICD-10-CM | POA: Insufficient documentation

## 2019-12-11 DIAGNOSIS — I480 Paroxysmal atrial fibrillation: Secondary | ICD-10-CM | POA: Diagnosis not present

## 2019-12-11 DIAGNOSIS — R1312 Dysphagia, oropharyngeal phase: Secondary | ICD-10-CM | POA: Diagnosis not present

## 2019-12-11 DIAGNOSIS — K219 Gastro-esophageal reflux disease without esophagitis: Secondary | ICD-10-CM | POA: Insufficient documentation

## 2019-12-11 DIAGNOSIS — Z951 Presence of aortocoronary bypass graft: Secondary | ICD-10-CM | POA: Insufficient documentation

## 2019-12-11 DIAGNOSIS — L97429 Non-pressure chronic ulcer of left heel and midfoot with unspecified severity: Secondary | ICD-10-CM | POA: Insufficient documentation

## 2019-12-11 DIAGNOSIS — R293 Abnormal posture: Secondary | ICD-10-CM | POA: Diagnosis not present

## 2019-12-11 DIAGNOSIS — F0390 Unspecified dementia without behavioral disturbance: Secondary | ICD-10-CM | POA: Diagnosis not present

## 2019-12-11 DIAGNOSIS — G629 Polyneuropathy, unspecified: Secondary | ICD-10-CM | POA: Diagnosis not present

## 2019-12-11 DIAGNOSIS — L89620 Pressure ulcer of left heel, unstageable: Secondary | ICD-10-CM | POA: Diagnosis not present

## 2019-12-11 DIAGNOSIS — Z9181 History of falling: Secondary | ICD-10-CM | POA: Diagnosis not present

## 2019-12-11 DIAGNOSIS — L89890 Pressure ulcer of other site, unstageable: Secondary | ICD-10-CM | POA: Diagnosis not present

## 2019-12-11 HISTORY — PX: ABDOMINAL AORTOGRAM W/LOWER EXTREMITY: CATH118223

## 2019-12-11 LAB — POCT I-STAT, CHEM 8
BUN: 18 mg/dL (ref 8–23)
Calcium, Ion: 1.23 mmol/L (ref 1.15–1.40)
Chloride: 104 mmol/L (ref 98–111)
Creatinine, Ser: 0.8 mg/dL (ref 0.44–1.00)
Glucose, Bld: 92 mg/dL (ref 70–99)
HCT: 43 % (ref 36.0–46.0)
Hemoglobin: 14.6 g/dL (ref 12.0–15.0)
Potassium: 4.1 mmol/L (ref 3.5–5.1)
Sodium: 139 mmol/L (ref 135–145)
TCO2: 26 mmol/L (ref 22–32)

## 2019-12-11 LAB — SARS CORONAVIRUS 2 BY RT PCR (HOSPITAL ORDER, PERFORMED IN ~~LOC~~ HOSPITAL LAB): SARS Coronavirus 2: NEGATIVE

## 2019-12-11 SURGERY — ABDOMINAL AORTOGRAM W/LOWER EXTREMITY
Anesthesia: LOCAL | Laterality: Bilateral

## 2019-12-11 MED ORDER — HYDRALAZINE HCL 20 MG/ML IJ SOLN: 5.0000 mg | INTRAMUSCULAR | Status: DC | PRN

## 2019-12-11 MED ORDER — SODIUM CHLORIDE 0.9 % IV SOLN: 250.0000 mL | INTRAVENOUS | Status: DC | PRN

## 2019-12-11 MED ORDER — HYDRALAZINE HCL 20 MG/ML IJ SOLN
INTRAMUSCULAR | Status: DC | PRN
  Administered 2019-12-11 (×2): 5 mg via INTRAVENOUS

## 2019-12-11 MED ORDER — HEPARIN (PORCINE) IN NACL 1000-0.9 UT/500ML-% IV SOLN
INTRAVENOUS | Status: AC
  Filled 2019-12-11: qty 1000

## 2019-12-11 MED ORDER — LABETALOL HCL 5 MG/ML IV SOLN: 10.0000 mg | INTRAVENOUS | Status: DC | PRN

## 2019-12-11 MED ORDER — SODIUM CHLORIDE 0.9 % WEIGHT BASED INFUSION: 1.0000 mL/kg/h | INTRAVENOUS | Status: DC

## 2019-12-11 MED ORDER — SODIUM CHLORIDE 0.9% FLUSH: 3.0000 mL | Freq: Two times a day (BID) | INTRAVENOUS | Status: DC

## 2019-12-11 MED ORDER — SODIUM CHLORIDE 0.9 % IV SOLN: INTRAVENOUS | Status: DC

## 2019-12-11 MED ORDER — ACETAMINOPHEN 325 MG PO TABS: 650.0000 mg | ORAL_TABLET | ORAL | Status: DC | PRN

## 2019-12-11 MED ORDER — HEPARIN (PORCINE) IN NACL 1000-0.9 UT/500ML-% IV SOLN
INTRAVENOUS | Status: DC | PRN
  Administered 2019-12-11 (×2): 500 mL

## 2019-12-11 MED ORDER — SODIUM CHLORIDE 0.9% FLUSH: 3.0000 mL | INTRAVENOUS | Status: DC | PRN

## 2019-12-11 MED ORDER — LIDOCAINE HCL (PF) 1 % IJ SOLN
INTRAMUSCULAR | Status: DC | PRN
  Administered 2019-12-11: 15 mL via INTRADERMAL

## 2019-12-11 MED ORDER — LIDOCAINE HCL (PF) 1 % IJ SOLN
INTRAMUSCULAR | Status: AC
  Filled 2019-12-11: qty 30

## 2019-12-11 MED ORDER — HYDRALAZINE HCL 20 MG/ML IJ SOLN
INTRAMUSCULAR | Status: AC
  Filled 2019-12-11: qty 1

## 2019-12-11 MED ORDER — ONDANSETRON HCL 4 MG/2ML IJ SOLN: 4.0000 mg | Freq: Four times a day (QID) | INTRAMUSCULAR | Status: DC | PRN

## 2019-12-11 MED ORDER — IODIXANOL 320 MG/ML IV SOLN
INTRAVENOUS | Status: DC | PRN
  Administered 2019-12-11: 70 mL via INTRA_ARTERIAL

## 2019-12-11 SURGICAL SUPPLY — 10 items
CATH OMNI FLUSH 5F 65CM (CATHETERS) ×2 IMPLANT
CLOSURE MYNX CONTROL 5F (Vascular Products) ×2 IMPLANT
KIT MICROPUNCTURE NIT STIFF (SHEATH) ×2 IMPLANT
KIT PV (KITS) ×2 IMPLANT
SHEATH PINNACLE 5F 10CM (SHEATH) ×2 IMPLANT
SHEATH PROBE COVER 6X72 (BAG) ×2 IMPLANT
SYR MEDRAD MARK V 150ML (SYRINGE) ×2 IMPLANT
TRANSDUCER W/STOPCOCK (MISCELLANEOUS) ×2 IMPLANT
TRAY PV CATH (CUSTOM PROCEDURE TRAY) ×2 IMPLANT
WIRE BENTSON .035X145CM (WIRE) ×2 IMPLANT

## 2019-12-11 NOTE — Op Note (Signed)
-     Patient name: Cheryl Zamora MRN: [id number] DOB: 1923-06-27 Sex: female  12/11/2019 Pre-operative Diagnosis: Left leg ulcer Post-operative diagnosis:  Same Surgeon:  Annamarie Major Procedure Performed:  1.  Ultrasound-guided access, right femoral artery  2.  Abdominal aortogram  3.  Bilateral lower extremity runoff  4.  Closure device, Mynx \   Indications: This is a 86 year old female with nonhealing wounds to her left foot.  She comes in today for arterial evaluation.  Procedure:  The patient was identified in the holding area and taken to room 8.  The patient was then placed supine on the table and prepped and draped in the usual sterile fashion.  A time out was called.  Ultrasound was used to evaluate the right common femoral artery.  It was patent .  A digital ultrasound image was acquired.  A micropuncture needle was used to access the right common femoral artery under ultrasound guidance.  An 018 wire was advanced without resistance and a micropuncture sheath was placed.  The 018 wire was removed and a benson wire was placed.  The micropuncture sheath was exchanged for a 5 french sheath.  An omniflush catheter was advanced over the wire to the level of L-1.  An abdominal angiogram was obtained.  Next, the catheter was pulled down to the aortic bifurcation and bilateral runoff was obtained Findings:   Aortogram: No significant renal artery stenosis was identified.  The infrarenal abdominal aorta is patent without stenosis.  The right common iliac and external iliac artery are widely patent.  The left common iliac artery is patent and the external iliac artery is occluded  Right Lower Extremity: The right common femoral and profundofemoral artery are patent without stenosis.  The superficial femoral artery is patent down to the adductor canal where it occludes.  There is reconstitution of a diminutive popliteal artery with severe tibial disease  Left Lower Extremity: The common femoral  artery and superficial femoral artery are occluded.  The profundofemoral artery reconstitutes from hypogastric collaterals.  There is reconstitution of a diseased superficial femoral artery which is small in caliber.  There is diffuse disease throughout the popliteal artery and severe tibial disease.  Intervention: None  Impression:  #1  Occluded left external iliac artery with reconstitution of the profundofemoral artery from hypogastric collaterals.  There is diffuse outflow and runoff disease on the left  #2  Occluded right superficial femoral artery with reconstitution of a diminutive right popliteal artery with severe tibial disease  #3  The patient is not a candidate for revascularization.  Wound care is recommended until she requires an above-knee amputation     V. Annamarie Major, M.D., [facility] Vascular and Vein Specialists of [place] Office: [phone] Pager:  [phone]

## 2019-12-11 NOTE — Discharge Instructions (Signed)
Femoral Site Care This sheet gives you information about how to care for yourself after your procedure. Your health care provider may also give you more specific instructions. If you have problems or questions, contact your health care provider. What can I expect after the procedure? After the procedure, it is common to have:  Bruising that usually fades within 1-2 weeks.  Tenderness at the site. Follow these instructions at home: Wound care  Follow instructions from your health care provider about how to take care of your insertion site. Make sure you: ? Wash your hands with soap and water before you change your bandage (dressing). If soap and water are not available, use hand sanitizer. ? Change your dressing as told by your health care provider. ? Leave stitches (sutures), skin glue, or adhesive strips in place. These skin closures may need to stay in place for 2 weeks or longer. If adhesive strip edges start to loosen and curl up, you may trim the loose edges. Do not remove adhesive strips completely unless your health care provider tells you to do that.  Do not take baths, swim, or use a hot tub until your health care provider approves.  You may shower 24-48 hours after the procedure or as told by your health care provider. ? Gently wash the site with plain soap and water. ? Pat the area dry with a clean towel. ? Do not rub the site. This may cause bleeding.  Do not apply powder or lotion to the site. Keep the site clean and dry.  Check your femoral site every day for signs of infection. Check for: ? Redness, swelling, or pain. ? Fluid or blood. ? Warmth. ? Pus or a bad smell. Activity  For the first 2-3 days after your procedure, or as long as directed: ? Avoid climbing stairs as much as possible. ? Do not squat.  Do not lift anything that is heavier than 10 lb (4.5 kg), or the limit that you are told, until your health care provider says that it is safe.  Rest as  directed. ? Avoid sitting for a long time without moving. Get up to take short walks every 1-2 hours.  Do not drive for 24 hours if you were given a medicine to help you relax (sedative). General instructions  Take over-the-counter and prescription medicines only as told by your health care provider.  Keep all follow-up visits as told by your health care provider. This is important. Contact a health care provider if you have:  A fever or chills.  You have redness, swelling, or pain around your insertion site. Get help right away if:  The catheter insertion area swells very fast.  You pass out.  You suddenly start to sweat or your skin gets clammy.  The catheter insertion area is bleeding, and the bleeding does not stop when you hold steady pressure on the area.  The area near or just beyond the catheter insertion site becomes pale, cool, tingly, or numb. These symptoms may represent a serious problem that is an emergency. Do not wait to see if the symptoms will go away. Get medical help right away. Call your local emergency services (911 in the [place] Do not drive yourself to the hospital. Summary  After the procedure, it is common to have bruising that usually fades within 1-2 weeks.  Check your femoral site every day for signs of infection.  Do not lift anything that is heavier than 10 lb (4.5 kg), or the   limit that you are told, until your health care provider says that it is safe. This information is not intended to replace advice given to you by your health care provider. Make sure you discuss any questions you have with your health care provider. Document Revised: 02/28/2017 Document Reviewed: 02/28/2017 Elsevier Patient Education  2020 Elsevier Inc.  

## 2019-12-11 NOTE — Progress Notes (Signed)
Called [facility]/ spoke with [facility] LPN states pt is not on Xarelto,

## 2019-12-11 NOTE — Interval H&P Note (Signed)
History and Physical Interval Note:  12/11/2019 10:23 AM  Cheryl Zamora  has presented today for surgery, with the diagnosis of Atherosclerosis  of native artery of the extremity  with ulcer.  The various methods of treatment have been discussed with the patient and family. After consideration of risks, benefits and other options for treatment, the patient has consented to  Procedure(s): ABDOMINAL AORTOGRAM W/LOWER EXTREMITY (Bilateral) as a surgical intervention.  The patient's history has been reviewed, patient examined, no change in status, stable for surgery.  I have reviewed the patient's chart and labs.  Questions were answered to the patient's satisfaction.     Annamarie Major

## 2019-12-12 ENCOUNTER — Encounter (HOSPITAL_COMMUNITY): Payer: Self-pay | Admitting: Surgery

## 2019-12-12 DIAGNOSIS — I480 Paroxysmal atrial fibrillation: Secondary | ICD-10-CM | POA: Diagnosis not present

## 2019-12-12 DIAGNOSIS — R293 Abnormal posture: Secondary | ICD-10-CM | POA: Diagnosis not present

## 2019-12-12 DIAGNOSIS — F0390 Unspecified dementia without behavioral disturbance: Secondary | ICD-10-CM | POA: Diagnosis not present

## 2019-12-12 DIAGNOSIS — Z4789 Encounter for other orthopedic aftercare: Secondary | ICD-10-CM | POA: Diagnosis not present

## 2019-12-12 DIAGNOSIS — R2689 Other abnormalities of gait and mobility: Secondary | ICD-10-CM | POA: Diagnosis not present

## 2019-12-12 DIAGNOSIS — I509 Heart failure, unspecified: Secondary | ICD-10-CM | POA: Diagnosis not present

## 2019-12-12 DIAGNOSIS — S72142S Displaced intertrochanteric fracture of left femur, sequela: Secondary | ICD-10-CM | POA: Diagnosis not present

## 2019-12-12 DIAGNOSIS — R1312 Dysphagia, oropharyngeal phase: Secondary | ICD-10-CM | POA: Diagnosis not present

## 2019-12-12 DIAGNOSIS — R41841 Cognitive communication deficit: Secondary | ICD-10-CM | POA: Diagnosis not present

## 2019-12-12 DIAGNOSIS — Z9181 History of falling: Secondary | ICD-10-CM | POA: Diagnosis not present

## 2019-12-12 DIAGNOSIS — R262 Difficulty in walking, not elsewhere classified: Secondary | ICD-10-CM | POA: Diagnosis not present

## 2019-12-12 DIAGNOSIS — M6281 Muscle weakness (generalized): Secondary | ICD-10-CM | POA: Diagnosis not present

## 2019-12-13 DIAGNOSIS — I480 Paroxysmal atrial fibrillation: Secondary | ICD-10-CM | POA: Diagnosis not present

## 2019-12-13 DIAGNOSIS — Z4789 Encounter for other orthopedic aftercare: Secondary | ICD-10-CM | POA: Diagnosis not present

## 2019-12-13 DIAGNOSIS — L89890 Pressure ulcer of other site, unstageable: Secondary | ICD-10-CM | POA: Diagnosis not present

## 2019-12-13 DIAGNOSIS — I509 Heart failure, unspecified: Secondary | ICD-10-CM | POA: Diagnosis not present

## 2019-12-13 DIAGNOSIS — R262 Difficulty in walking, not elsewhere classified: Secondary | ICD-10-CM | POA: Diagnosis not present

## 2019-12-13 DIAGNOSIS — R293 Abnormal posture: Secondary | ICD-10-CM | POA: Diagnosis not present

## 2019-12-13 DIAGNOSIS — R41841 Cognitive communication deficit: Secondary | ICD-10-CM | POA: Diagnosis not present

## 2019-12-13 DIAGNOSIS — L89620 Pressure ulcer of left heel, unstageable: Secondary | ICD-10-CM | POA: Diagnosis not present

## 2019-12-13 DIAGNOSIS — R2689 Other abnormalities of gait and mobility: Secondary | ICD-10-CM | POA: Diagnosis not present

## 2019-12-13 DIAGNOSIS — F0390 Unspecified dementia without behavioral disturbance: Secondary | ICD-10-CM | POA: Diagnosis not present

## 2019-12-13 DIAGNOSIS — M6281 Muscle weakness (generalized): Secondary | ICD-10-CM | POA: Diagnosis not present

## 2019-12-13 DIAGNOSIS — S72142S Displaced intertrochanteric fracture of left femur, sequela: Secondary | ICD-10-CM | POA: Diagnosis not present

## 2019-12-13 DIAGNOSIS — R1312 Dysphagia, oropharyngeal phase: Secondary | ICD-10-CM | POA: Diagnosis not present

## 2019-12-13 DIAGNOSIS — Z9181 History of falling: Secondary | ICD-10-CM | POA: Diagnosis not present

## 2019-12-14 DIAGNOSIS — I509 Heart failure, unspecified: Secondary | ICD-10-CM | POA: Diagnosis not present

## 2019-12-14 DIAGNOSIS — R2689 Other abnormalities of gait and mobility: Secondary | ICD-10-CM | POA: Diagnosis not present

## 2019-12-14 DIAGNOSIS — Z9181 History of falling: Secondary | ICD-10-CM | POA: Diagnosis not present

## 2019-12-14 DIAGNOSIS — S72142S Displaced intertrochanteric fracture of left femur, sequela: Secondary | ICD-10-CM | POA: Diagnosis not present

## 2019-12-14 DIAGNOSIS — R262 Difficulty in walking, not elsewhere classified: Secondary | ICD-10-CM | POA: Diagnosis not present

## 2019-12-14 DIAGNOSIS — Z4789 Encounter for other orthopedic aftercare: Secondary | ICD-10-CM | POA: Diagnosis not present

## 2019-12-14 DIAGNOSIS — R293 Abnormal posture: Secondary | ICD-10-CM | POA: Diagnosis not present

## 2019-12-14 DIAGNOSIS — M6281 Muscle weakness (generalized): Secondary | ICD-10-CM | POA: Diagnosis not present

## 2019-12-14 DIAGNOSIS — I480 Paroxysmal atrial fibrillation: Secondary | ICD-10-CM | POA: Diagnosis not present

## 2019-12-14 DIAGNOSIS — R1312 Dysphagia, oropharyngeal phase: Secondary | ICD-10-CM | POA: Diagnosis not present

## 2019-12-14 DIAGNOSIS — R41841 Cognitive communication deficit: Secondary | ICD-10-CM | POA: Diagnosis not present

## 2019-12-14 DIAGNOSIS — F0390 Unspecified dementia without behavioral disturbance: Secondary | ICD-10-CM | POA: Diagnosis not present

## 2019-12-17 DIAGNOSIS — R1312 Dysphagia, oropharyngeal phase: Secondary | ICD-10-CM | POA: Diagnosis not present

## 2019-12-17 DIAGNOSIS — I509 Heart failure, unspecified: Secondary | ICD-10-CM | POA: Diagnosis not present

## 2019-12-17 DIAGNOSIS — Z4789 Encounter for other orthopedic aftercare: Secondary | ICD-10-CM | POA: Diagnosis not present

## 2019-12-17 DIAGNOSIS — R2689 Other abnormalities of gait and mobility: Secondary | ICD-10-CM | POA: Diagnosis not present

## 2019-12-17 DIAGNOSIS — M6281 Muscle weakness (generalized): Secondary | ICD-10-CM | POA: Diagnosis not present

## 2019-12-17 DIAGNOSIS — I480 Paroxysmal atrial fibrillation: Secondary | ICD-10-CM | POA: Diagnosis not present

## 2019-12-17 DIAGNOSIS — Z9181 History of falling: Secondary | ICD-10-CM | POA: Diagnosis not present

## 2019-12-17 DIAGNOSIS — R293 Abnormal posture: Secondary | ICD-10-CM | POA: Diagnosis not present

## 2019-12-17 DIAGNOSIS — F0390 Unspecified dementia without behavioral disturbance: Secondary | ICD-10-CM | POA: Diagnosis not present

## 2019-12-17 DIAGNOSIS — S72142S Displaced intertrochanteric fracture of left femur, sequela: Secondary | ICD-10-CM | POA: Diagnosis not present

## 2019-12-17 DIAGNOSIS — R262 Difficulty in walking, not elsewhere classified: Secondary | ICD-10-CM | POA: Diagnosis not present

## 2019-12-17 DIAGNOSIS — R41841 Cognitive communication deficit: Secondary | ICD-10-CM | POA: Diagnosis not present

## 2019-12-18 DIAGNOSIS — R2689 Other abnormalities of gait and mobility: Secondary | ICD-10-CM | POA: Diagnosis not present

## 2019-12-18 DIAGNOSIS — S72142S Displaced intertrochanteric fracture of left femur, sequela: Secondary | ICD-10-CM | POA: Diagnosis not present

## 2019-12-18 DIAGNOSIS — R41841 Cognitive communication deficit: Secondary | ICD-10-CM | POA: Diagnosis not present

## 2019-12-18 DIAGNOSIS — I509 Heart failure, unspecified: Secondary | ICD-10-CM | POA: Diagnosis not present

## 2019-12-18 DIAGNOSIS — Z4789 Encounter for other orthopedic aftercare: Secondary | ICD-10-CM | POA: Diagnosis not present

## 2019-12-18 DIAGNOSIS — M6281 Muscle weakness (generalized): Secondary | ICD-10-CM | POA: Diagnosis not present

## 2019-12-18 DIAGNOSIS — Z9181 History of falling: Secondary | ICD-10-CM | POA: Diagnosis not present

## 2019-12-18 DIAGNOSIS — R1312 Dysphagia, oropharyngeal phase: Secondary | ICD-10-CM | POA: Diagnosis not present

## 2019-12-18 DIAGNOSIS — F0390 Unspecified dementia without behavioral disturbance: Secondary | ICD-10-CM | POA: Diagnosis not present

## 2019-12-18 DIAGNOSIS — I480 Paroxysmal atrial fibrillation: Secondary | ICD-10-CM | POA: Diagnosis not present

## 2019-12-18 DIAGNOSIS — R262 Difficulty in walking, not elsewhere classified: Secondary | ICD-10-CM | POA: Diagnosis not present

## 2019-12-18 DIAGNOSIS — R293 Abnormal posture: Secondary | ICD-10-CM | POA: Diagnosis not present

## 2019-12-19 DIAGNOSIS — S72142S Displaced intertrochanteric fracture of left femur, sequela: Secondary | ICD-10-CM | POA: Diagnosis not present

## 2019-12-19 DIAGNOSIS — I480 Paroxysmal atrial fibrillation: Secondary | ICD-10-CM | POA: Diagnosis not present

## 2019-12-19 DIAGNOSIS — R2689 Other abnormalities of gait and mobility: Secondary | ICD-10-CM | POA: Diagnosis not present

## 2019-12-19 DIAGNOSIS — R41841 Cognitive communication deficit: Secondary | ICD-10-CM | POA: Diagnosis not present

## 2019-12-19 DIAGNOSIS — R262 Difficulty in walking, not elsewhere classified: Secondary | ICD-10-CM | POA: Diagnosis not present

## 2019-12-19 DIAGNOSIS — M6281 Muscle weakness (generalized): Secondary | ICD-10-CM | POA: Diagnosis not present

## 2019-12-19 DIAGNOSIS — Z9181 History of falling: Secondary | ICD-10-CM | POA: Diagnosis not present

## 2019-12-19 DIAGNOSIS — R1312 Dysphagia, oropharyngeal phase: Secondary | ICD-10-CM | POA: Diagnosis not present

## 2019-12-19 DIAGNOSIS — Z4789 Encounter for other orthopedic aftercare: Secondary | ICD-10-CM | POA: Diagnosis not present

## 2019-12-19 DIAGNOSIS — F0390 Unspecified dementia without behavioral disturbance: Secondary | ICD-10-CM | POA: Diagnosis not present

## 2019-12-19 DIAGNOSIS — R293 Abnormal posture: Secondary | ICD-10-CM | POA: Diagnosis not present

## 2019-12-19 DIAGNOSIS — I509 Heart failure, unspecified: Secondary | ICD-10-CM | POA: Diagnosis not present

## 2019-12-20 DIAGNOSIS — I509 Heart failure, unspecified: Secondary | ICD-10-CM | POA: Diagnosis not present

## 2019-12-20 DIAGNOSIS — R2689 Other abnormalities of gait and mobility: Secondary | ICD-10-CM | POA: Diagnosis not present

## 2019-12-20 DIAGNOSIS — S72142S Displaced intertrochanteric fracture of left femur, sequela: Secondary | ICD-10-CM | POA: Diagnosis not present

## 2019-12-20 DIAGNOSIS — Z4789 Encounter for other orthopedic aftercare: Secondary | ICD-10-CM | POA: Diagnosis not present

## 2019-12-20 DIAGNOSIS — R293 Abnormal posture: Secondary | ICD-10-CM | POA: Diagnosis not present

## 2019-12-20 DIAGNOSIS — I480 Paroxysmal atrial fibrillation: Secondary | ICD-10-CM | POA: Diagnosis not present

## 2019-12-20 DIAGNOSIS — F0390 Unspecified dementia without behavioral disturbance: Secondary | ICD-10-CM | POA: Diagnosis not present

## 2019-12-20 DIAGNOSIS — M6281 Muscle weakness (generalized): Secondary | ICD-10-CM | POA: Diagnosis not present

## 2019-12-20 DIAGNOSIS — R262 Difficulty in walking, not elsewhere classified: Secondary | ICD-10-CM | POA: Diagnosis not present

## 2019-12-20 DIAGNOSIS — R1312 Dysphagia, oropharyngeal phase: Secondary | ICD-10-CM | POA: Diagnosis not present

## 2019-12-20 DIAGNOSIS — Z9181 History of falling: Secondary | ICD-10-CM | POA: Diagnosis not present

## 2019-12-20 DIAGNOSIS — R41841 Cognitive communication deficit: Secondary | ICD-10-CM | POA: Diagnosis not present

## 2019-12-25 DIAGNOSIS — R262 Difficulty in walking, not elsewhere classified: Secondary | ICD-10-CM | POA: Diagnosis not present

## 2019-12-25 DIAGNOSIS — R0989 Other specified symptoms and signs involving the circulatory and respiratory systems: Secondary | ICD-10-CM | POA: Diagnosis not present

## 2019-12-25 DIAGNOSIS — L89620 Pressure ulcer of left heel, unstageable: Secondary | ICD-10-CM | POA: Diagnosis not present

## 2019-12-25 DIAGNOSIS — L89890 Pressure ulcer of other site, unstageable: Secondary | ICD-10-CM | POA: Diagnosis not present

## 2020-01-01 ENCOUNTER — Telehealth: Payer: Self-pay | Admitting: Internal Medicine

## 2020-01-01 DIAGNOSIS — Z515 Encounter for palliative care: Secondary | ICD-10-CM

## 2020-01-02 DIAGNOSIS — M79672 Pain in left foot: Secondary | ICD-10-CM | POA: Diagnosis not present

## 2020-01-02 DIAGNOSIS — I10 Essential (primary) hypertension: Secondary | ICD-10-CM | POA: Diagnosis not present

## 2020-01-02 DIAGNOSIS — I480 Paroxysmal atrial fibrillation: Secondary | ICD-10-CM | POA: Diagnosis not present

## 2020-01-02 DIAGNOSIS — I739 Peripheral vascular disease, unspecified: Secondary | ICD-10-CM | POA: Diagnosis not present

## 2020-01-02 NOTE — Telephone Encounter (Signed)
Phoned Mr. Delfina Redwood to introduce palliative and hospice care to [facility].  Reviewed goals of care which is primarily to improve pain management and comfort care.Marland Kitchen  He reports that amputation of her left leg is not an interest that he has for her. Advanced directives were reviewed.  He plans on reviewing her living will to assist in making advanced decisions for patient.  MOST form is currently complete at SNF with delegations of DNAR, limited additional interventions, antibiotics and IV fluids if indicated, if necessary "consult with POA regarding feeding tube.   Plan on conferring with SNF provider to adjust currently analgesics that are giving partial pain management.  I will update Mr. Delfina Redwood after plan is put into action.  He thanked me for the call.  Gonzella Lex, NP-C

## 2020-01-04 ENCOUNTER — Other Ambulatory Visit: Payer: Self-pay

## 2020-01-04 DIAGNOSIS — I739 Peripheral vascular disease, unspecified: Secondary | ICD-10-CM | POA: Diagnosis not present

## 2020-01-04 DIAGNOSIS — M79672 Pain in left foot: Secondary | ICD-10-CM | POA: Diagnosis not present

## 2020-01-04 DIAGNOSIS — I7025 Atherosclerosis of native arteries of other extremities with ulceration: Secondary | ICD-10-CM

## 2020-01-09 DIAGNOSIS — L89890 Pressure ulcer of other site, unstageable: Secondary | ICD-10-CM | POA: Diagnosis not present

## 2020-01-09 DIAGNOSIS — L89620 Pressure ulcer of left heel, unstageable: Secondary | ICD-10-CM | POA: Diagnosis not present

## 2020-01-11 DIAGNOSIS — L89620 Pressure ulcer of left heel, unstageable: Secondary | ICD-10-CM | POA: Diagnosis not present

## 2020-01-11 DIAGNOSIS — I739 Peripheral vascular disease, unspecified: Secondary | ICD-10-CM | POA: Diagnosis not present

## 2020-01-11 DIAGNOSIS — Z515 Encounter for palliative care: Secondary | ICD-10-CM | POA: Diagnosis not present

## 2020-01-11 DIAGNOSIS — G8929 Other chronic pain: Secondary | ICD-10-CM | POA: Diagnosis not present

## 2020-01-14 ENCOUNTER — Inpatient Hospital Stay (HOSPITAL_COMMUNITY): Admission: RE | Admit: 2020-01-14 | Payer: Medicare Other | Source: Ambulatory Visit

## 2020-01-14 ENCOUNTER — Encounter (HOSPITAL_COMMUNITY): Payer: Medicare Other

## 2020-01-22 DIAGNOSIS — I70245 Atherosclerosis of native arteries of left leg with ulceration of other part of foot: Secondary | ICD-10-CM | POA: Diagnosis not present

## 2020-01-22 DIAGNOSIS — L89620 Pressure ulcer of left heel, unstageable: Secondary | ICD-10-CM | POA: Diagnosis not present

## 2020-01-29 DIAGNOSIS — S72142S Displaced intertrochanteric fracture of left femur, sequela: Secondary | ICD-10-CM | POA: Diagnosis not present

## 2020-01-29 DIAGNOSIS — Z9181 History of falling: Secondary | ICD-10-CM | POA: Diagnosis not present

## 2020-01-29 DIAGNOSIS — R293 Abnormal posture: Secondary | ICD-10-CM | POA: Diagnosis not present

## 2020-01-29 DIAGNOSIS — F0390 Unspecified dementia without behavioral disturbance: Secondary | ICD-10-CM | POA: Diagnosis not present

## 2020-01-29 DIAGNOSIS — I480 Paroxysmal atrial fibrillation: Secondary | ICD-10-CM | POA: Diagnosis not present

## 2020-01-29 DIAGNOSIS — M6281 Muscle weakness (generalized): Secondary | ICD-10-CM | POA: Diagnosis not present

## 2020-01-29 DIAGNOSIS — R262 Difficulty in walking, not elsewhere classified: Secondary | ICD-10-CM | POA: Diagnosis not present

## 2020-01-29 DIAGNOSIS — Z4789 Encounter for other orthopedic aftercare: Secondary | ICD-10-CM | POA: Diagnosis not present

## 2020-01-29 DIAGNOSIS — I70244 Atherosclerosis of native arteries of left leg with ulceration of heel and midfoot: Secondary | ICD-10-CM | POA: Diagnosis not present

## 2020-01-29 DIAGNOSIS — I70245 Atherosclerosis of native arteries of left leg with ulceration of other part of foot: Secondary | ICD-10-CM | POA: Diagnosis not present

## 2020-01-29 DIAGNOSIS — I509 Heart failure, unspecified: Secondary | ICD-10-CM | POA: Diagnosis not present

## 2020-01-29 DIAGNOSIS — R1312 Dysphagia, oropharyngeal phase: Secondary | ICD-10-CM | POA: Diagnosis not present

## 2020-01-29 DIAGNOSIS — R2689 Other abnormalities of gait and mobility: Secondary | ICD-10-CM | POA: Diagnosis not present

## 2020-01-29 DIAGNOSIS — R41841 Cognitive communication deficit: Secondary | ICD-10-CM | POA: Diagnosis not present

## 2020-01-29 DIAGNOSIS — S81801A Unspecified open wound, right lower leg, initial encounter: Secondary | ICD-10-CM | POA: Diagnosis not present

## 2020-01-30 DIAGNOSIS — R262 Difficulty in walking, not elsewhere classified: Secondary | ICD-10-CM | POA: Diagnosis not present

## 2020-01-30 DIAGNOSIS — Z9181 History of falling: Secondary | ICD-10-CM | POA: Diagnosis not present

## 2020-01-30 DIAGNOSIS — R293 Abnormal posture: Secondary | ICD-10-CM | POA: Diagnosis not present

## 2020-01-30 DIAGNOSIS — M6281 Muscle weakness (generalized): Secondary | ICD-10-CM | POA: Diagnosis not present

## 2020-01-30 DIAGNOSIS — F0390 Unspecified dementia without behavioral disturbance: Secondary | ICD-10-CM | POA: Diagnosis not present

## 2020-01-30 DIAGNOSIS — R41841 Cognitive communication deficit: Secondary | ICD-10-CM | POA: Diagnosis not present

## 2020-01-30 DIAGNOSIS — I509 Heart failure, unspecified: Secondary | ICD-10-CM | POA: Diagnosis not present

## 2020-01-30 DIAGNOSIS — R1312 Dysphagia, oropharyngeal phase: Secondary | ICD-10-CM | POA: Diagnosis not present

## 2020-01-30 DIAGNOSIS — I480 Paroxysmal atrial fibrillation: Secondary | ICD-10-CM | POA: Diagnosis not present

## 2020-01-30 DIAGNOSIS — R2689 Other abnormalities of gait and mobility: Secondary | ICD-10-CM | POA: Diagnosis not present

## 2020-01-30 DIAGNOSIS — Z4789 Encounter for other orthopedic aftercare: Secondary | ICD-10-CM | POA: Diagnosis not present

## 2020-01-30 DIAGNOSIS — S72142S Displaced intertrochanteric fracture of left femur, sequela: Secondary | ICD-10-CM | POA: Diagnosis not present

## 2020-01-31 DIAGNOSIS — I509 Heart failure, unspecified: Secondary | ICD-10-CM | POA: Diagnosis not present

## 2020-01-31 DIAGNOSIS — R262 Difficulty in walking, not elsewhere classified: Secondary | ICD-10-CM | POA: Diagnosis not present

## 2020-01-31 DIAGNOSIS — Z4789 Encounter for other orthopedic aftercare: Secondary | ICD-10-CM | POA: Diagnosis not present

## 2020-01-31 DIAGNOSIS — F0390 Unspecified dementia without behavioral disturbance: Secondary | ICD-10-CM | POA: Diagnosis not present

## 2020-01-31 DIAGNOSIS — I480 Paroxysmal atrial fibrillation: Secondary | ICD-10-CM | POA: Diagnosis not present

## 2020-01-31 DIAGNOSIS — R2689 Other abnormalities of gait and mobility: Secondary | ICD-10-CM | POA: Diagnosis not present

## 2020-01-31 DIAGNOSIS — R1312 Dysphagia, oropharyngeal phase: Secondary | ICD-10-CM | POA: Diagnosis not present

## 2020-01-31 DIAGNOSIS — M6281 Muscle weakness (generalized): Secondary | ICD-10-CM | POA: Diagnosis not present

## 2020-01-31 DIAGNOSIS — R41841 Cognitive communication deficit: Secondary | ICD-10-CM | POA: Diagnosis not present

## 2020-01-31 DIAGNOSIS — S72142S Displaced intertrochanteric fracture of left femur, sequela: Secondary | ICD-10-CM | POA: Diagnosis not present

## 2020-01-31 DIAGNOSIS — R293 Abnormal posture: Secondary | ICD-10-CM | POA: Diagnosis not present

## 2020-01-31 DIAGNOSIS — Z9181 History of falling: Secondary | ICD-10-CM | POA: Diagnosis not present

## 2020-02-05 DIAGNOSIS — R262 Difficulty in walking, not elsewhere classified: Secondary | ICD-10-CM | POA: Diagnosis not present

## 2020-02-05 DIAGNOSIS — I509 Heart failure, unspecified: Secondary | ICD-10-CM | POA: Diagnosis not present

## 2020-02-05 DIAGNOSIS — I480 Paroxysmal atrial fibrillation: Secondary | ICD-10-CM | POA: Diagnosis not present

## 2020-02-05 DIAGNOSIS — R41841 Cognitive communication deficit: Secondary | ICD-10-CM | POA: Diagnosis not present

## 2020-02-05 DIAGNOSIS — S72142S Displaced intertrochanteric fracture of left femur, sequela: Secondary | ICD-10-CM | POA: Diagnosis not present

## 2020-02-05 DIAGNOSIS — S81801D Unspecified open wound, right lower leg, subsequent encounter: Secondary | ICD-10-CM | POA: Diagnosis not present

## 2020-02-05 DIAGNOSIS — R293 Abnormal posture: Secondary | ICD-10-CM | POA: Diagnosis not present

## 2020-02-05 DIAGNOSIS — F0390 Unspecified dementia without behavioral disturbance: Secondary | ICD-10-CM | POA: Diagnosis not present

## 2020-02-05 DIAGNOSIS — I70244 Atherosclerosis of native arteries of left leg with ulceration of heel and midfoot: Secondary | ICD-10-CM | POA: Diagnosis not present

## 2020-02-05 DIAGNOSIS — Z4789 Encounter for other orthopedic aftercare: Secondary | ICD-10-CM | POA: Diagnosis not present

## 2020-02-05 DIAGNOSIS — Z9181 History of falling: Secondary | ICD-10-CM | POA: Diagnosis not present

## 2020-02-05 DIAGNOSIS — I70245 Atherosclerosis of native arteries of left leg with ulceration of other part of foot: Secondary | ICD-10-CM | POA: Diagnosis not present

## 2020-02-05 DIAGNOSIS — R1312 Dysphagia, oropharyngeal phase: Secondary | ICD-10-CM | POA: Diagnosis not present

## 2020-02-05 DIAGNOSIS — R2689 Other abnormalities of gait and mobility: Secondary | ICD-10-CM | POA: Diagnosis not present

## 2020-02-05 DIAGNOSIS — M6281 Muscle weakness (generalized): Secondary | ICD-10-CM | POA: Diagnosis not present

## 2020-02-06 DIAGNOSIS — R234 Changes in skin texture: Secondary | ICD-10-CM | POA: Diagnosis not present

## 2020-02-06 DIAGNOSIS — S51819A Laceration without foreign body of unspecified forearm, initial encounter: Secondary | ICD-10-CM | POA: Diagnosis not present

## 2020-02-07 DIAGNOSIS — L89620 Pressure ulcer of left heel, unstageable: Secondary | ICD-10-CM | POA: Diagnosis not present

## 2020-02-07 DIAGNOSIS — R41841 Cognitive communication deficit: Secondary | ICD-10-CM | POA: Diagnosis not present

## 2020-02-07 DIAGNOSIS — R293 Abnormal posture: Secondary | ICD-10-CM | POA: Diagnosis not present

## 2020-02-07 DIAGNOSIS — Z9181 History of falling: Secondary | ICD-10-CM | POA: Diagnosis not present

## 2020-02-07 DIAGNOSIS — Z4789 Encounter for other orthopedic aftercare: Secondary | ICD-10-CM | POA: Diagnosis not present

## 2020-02-07 DIAGNOSIS — F0390 Unspecified dementia without behavioral disturbance: Secondary | ICD-10-CM | POA: Diagnosis not present

## 2020-02-07 DIAGNOSIS — S81801D Unspecified open wound, right lower leg, subsequent encounter: Secondary | ICD-10-CM | POA: Diagnosis not present

## 2020-02-07 DIAGNOSIS — M6281 Muscle weakness (generalized): Secondary | ICD-10-CM | POA: Diagnosis not present

## 2020-02-07 DIAGNOSIS — S72142S Displaced intertrochanteric fracture of left femur, sequela: Secondary | ICD-10-CM | POA: Diagnosis not present

## 2020-02-07 DIAGNOSIS — R262 Difficulty in walking, not elsewhere classified: Secondary | ICD-10-CM | POA: Diagnosis not present

## 2020-02-07 DIAGNOSIS — I509 Heart failure, unspecified: Secondary | ICD-10-CM | POA: Diagnosis not present

## 2020-02-07 DIAGNOSIS — L89890 Pressure ulcer of other site, unstageable: Secondary | ICD-10-CM | POA: Diagnosis not present

## 2020-02-07 DIAGNOSIS — R1312 Dysphagia, oropharyngeal phase: Secondary | ICD-10-CM | POA: Diagnosis not present

## 2020-02-07 DIAGNOSIS — I480 Paroxysmal atrial fibrillation: Secondary | ICD-10-CM | POA: Diagnosis not present

## 2020-02-07 DIAGNOSIS — R2689 Other abnormalities of gait and mobility: Secondary | ICD-10-CM | POA: Diagnosis not present

## 2020-02-08 DIAGNOSIS — S72142S Displaced intertrochanteric fracture of left femur, sequela: Secondary | ICD-10-CM | POA: Diagnosis not present

## 2020-02-08 DIAGNOSIS — R2689 Other abnormalities of gait and mobility: Secondary | ICD-10-CM | POA: Diagnosis not present

## 2020-02-08 DIAGNOSIS — F0390 Unspecified dementia without behavioral disturbance: Secondary | ICD-10-CM | POA: Diagnosis not present

## 2020-02-08 DIAGNOSIS — I509 Heart failure, unspecified: Secondary | ICD-10-CM | POA: Diagnosis not present

## 2020-02-08 DIAGNOSIS — I480 Paroxysmal atrial fibrillation: Secondary | ICD-10-CM | POA: Diagnosis not present

## 2020-02-08 DIAGNOSIS — R293 Abnormal posture: Secondary | ICD-10-CM | POA: Diagnosis not present

## 2020-02-08 DIAGNOSIS — R1312 Dysphagia, oropharyngeal phase: Secondary | ICD-10-CM | POA: Diagnosis not present

## 2020-02-08 DIAGNOSIS — R41841 Cognitive communication deficit: Secondary | ICD-10-CM | POA: Diagnosis not present

## 2020-02-08 DIAGNOSIS — M6281 Muscle weakness (generalized): Secondary | ICD-10-CM | POA: Diagnosis not present

## 2020-02-08 DIAGNOSIS — Z4789 Encounter for other orthopedic aftercare: Secondary | ICD-10-CM | POA: Diagnosis not present

## 2020-02-08 DIAGNOSIS — R262 Difficulty in walking, not elsewhere classified: Secondary | ICD-10-CM | POA: Diagnosis not present

## 2020-02-08 DIAGNOSIS — Z9181 History of falling: Secondary | ICD-10-CM | POA: Diagnosis not present

## 2020-02-12 DIAGNOSIS — S72142S Displaced intertrochanteric fracture of left femur, sequela: Secondary | ICD-10-CM | POA: Diagnosis not present

## 2020-02-12 DIAGNOSIS — Z9181 History of falling: Secondary | ICD-10-CM | POA: Diagnosis not present

## 2020-02-12 DIAGNOSIS — Z4789 Encounter for other orthopedic aftercare: Secondary | ICD-10-CM | POA: Diagnosis not present

## 2020-02-12 DIAGNOSIS — R2689 Other abnormalities of gait and mobility: Secondary | ICD-10-CM | POA: Diagnosis not present

## 2020-02-12 DIAGNOSIS — I509 Heart failure, unspecified: Secondary | ICD-10-CM | POA: Diagnosis not present

## 2020-02-12 DIAGNOSIS — R293 Abnormal posture: Secondary | ICD-10-CM | POA: Diagnosis not present

## 2020-02-12 DIAGNOSIS — S81801D Unspecified open wound, right lower leg, subsequent encounter: Secondary | ICD-10-CM | POA: Diagnosis not present

## 2020-02-12 DIAGNOSIS — R262 Difficulty in walking, not elsewhere classified: Secondary | ICD-10-CM | POA: Diagnosis not present

## 2020-02-12 DIAGNOSIS — R41841 Cognitive communication deficit: Secondary | ICD-10-CM | POA: Diagnosis not present

## 2020-02-12 DIAGNOSIS — I70244 Atherosclerosis of native arteries of left leg with ulceration of heel and midfoot: Secondary | ICD-10-CM | POA: Diagnosis not present

## 2020-02-12 DIAGNOSIS — F0390 Unspecified dementia without behavioral disturbance: Secondary | ICD-10-CM | POA: Diagnosis not present

## 2020-02-12 DIAGNOSIS — I480 Paroxysmal atrial fibrillation: Secondary | ICD-10-CM | POA: Diagnosis not present

## 2020-02-12 DIAGNOSIS — R1312 Dysphagia, oropharyngeal phase: Secondary | ICD-10-CM | POA: Diagnosis not present

## 2020-02-12 DIAGNOSIS — M6281 Muscle weakness (generalized): Secondary | ICD-10-CM | POA: Diagnosis not present

## 2020-02-12 DIAGNOSIS — I70245 Atherosclerosis of native arteries of left leg with ulceration of other part of foot: Secondary | ICD-10-CM | POA: Diagnosis not present

## 2020-02-13 DIAGNOSIS — Z4789 Encounter for other orthopedic aftercare: Secondary | ICD-10-CM | POA: Diagnosis not present

## 2020-02-13 DIAGNOSIS — I480 Paroxysmal atrial fibrillation: Secondary | ICD-10-CM | POA: Diagnosis not present

## 2020-02-13 DIAGNOSIS — R2689 Other abnormalities of gait and mobility: Secondary | ICD-10-CM | POA: Diagnosis not present

## 2020-02-13 DIAGNOSIS — F0390 Unspecified dementia without behavioral disturbance: Secondary | ICD-10-CM | POA: Diagnosis not present

## 2020-02-13 DIAGNOSIS — I509 Heart failure, unspecified: Secondary | ICD-10-CM | POA: Diagnosis not present

## 2020-02-13 DIAGNOSIS — R1312 Dysphagia, oropharyngeal phase: Secondary | ICD-10-CM | POA: Diagnosis not present

## 2020-02-13 DIAGNOSIS — R293 Abnormal posture: Secondary | ICD-10-CM | POA: Diagnosis not present

## 2020-02-13 DIAGNOSIS — R262 Difficulty in walking, not elsewhere classified: Secondary | ICD-10-CM | POA: Diagnosis not present

## 2020-02-13 DIAGNOSIS — S72142S Displaced intertrochanteric fracture of left femur, sequela: Secondary | ICD-10-CM | POA: Diagnosis not present

## 2020-02-13 DIAGNOSIS — M6281 Muscle weakness (generalized): Secondary | ICD-10-CM | POA: Diagnosis not present

## 2020-02-13 DIAGNOSIS — Z9181 History of falling: Secondary | ICD-10-CM | POA: Diagnosis not present

## 2020-02-13 DIAGNOSIS — R41841 Cognitive communication deficit: Secondary | ICD-10-CM | POA: Diagnosis not present

## 2020-02-15 DIAGNOSIS — I509 Heart failure, unspecified: Secondary | ICD-10-CM | POA: Diagnosis not present

## 2020-02-15 DIAGNOSIS — R634 Abnormal weight loss: Secondary | ICD-10-CM | POA: Diagnosis not present

## 2020-02-15 DIAGNOSIS — M6281 Muscle weakness (generalized): Secondary | ICD-10-CM | POA: Diagnosis not present

## 2020-02-15 DIAGNOSIS — I779 Disorder of arteries and arterioles, unspecified: Secondary | ICD-10-CM | POA: Diagnosis not present

## 2020-02-15 DIAGNOSIS — R2689 Other abnormalities of gait and mobility: Secondary | ICD-10-CM | POA: Diagnosis not present

## 2020-02-15 DIAGNOSIS — Z4789 Encounter for other orthopedic aftercare: Secondary | ICD-10-CM | POA: Diagnosis not present

## 2020-02-15 DIAGNOSIS — R0989 Other specified symptoms and signs involving the circulatory and respiratory systems: Secondary | ICD-10-CM | POA: Diagnosis not present

## 2020-02-15 DIAGNOSIS — R1312 Dysphagia, oropharyngeal phase: Secondary | ICD-10-CM | POA: Diagnosis not present

## 2020-02-15 DIAGNOSIS — R41841 Cognitive communication deficit: Secondary | ICD-10-CM | POA: Diagnosis not present

## 2020-02-15 DIAGNOSIS — R293 Abnormal posture: Secondary | ICD-10-CM | POA: Diagnosis not present

## 2020-02-15 DIAGNOSIS — F0390 Unspecified dementia without behavioral disturbance: Secondary | ICD-10-CM | POA: Diagnosis not present

## 2020-02-15 DIAGNOSIS — Z9181 History of falling: Secondary | ICD-10-CM | POA: Diagnosis not present

## 2020-02-15 DIAGNOSIS — R262 Difficulty in walking, not elsewhere classified: Secondary | ICD-10-CM | POA: Diagnosis not present

## 2020-02-15 DIAGNOSIS — I480 Paroxysmal atrial fibrillation: Secondary | ICD-10-CM | POA: Diagnosis not present

## 2020-02-15 DIAGNOSIS — S72142S Displaced intertrochanteric fracture of left femur, sequela: Secondary | ICD-10-CM | POA: Diagnosis not present

## 2020-02-18 DIAGNOSIS — S81801D Unspecified open wound, right lower leg, subsequent encounter: Secondary | ICD-10-CM | POA: Diagnosis not present

## 2020-02-19 DIAGNOSIS — R41841 Cognitive communication deficit: Secondary | ICD-10-CM | POA: Diagnosis not present

## 2020-02-19 DIAGNOSIS — S81801D Unspecified open wound, right lower leg, subsequent encounter: Secondary | ICD-10-CM | POA: Diagnosis not present

## 2020-02-19 DIAGNOSIS — R293 Abnormal posture: Secondary | ICD-10-CM | POA: Diagnosis not present

## 2020-02-19 DIAGNOSIS — I480 Paroxysmal atrial fibrillation: Secondary | ICD-10-CM | POA: Diagnosis not present

## 2020-02-19 DIAGNOSIS — R262 Difficulty in walking, not elsewhere classified: Secondary | ICD-10-CM | POA: Diagnosis not present

## 2020-02-19 DIAGNOSIS — I70245 Atherosclerosis of native arteries of left leg with ulceration of other part of foot: Secondary | ICD-10-CM | POA: Diagnosis not present

## 2020-02-19 DIAGNOSIS — S72142S Displaced intertrochanteric fracture of left femur, sequela: Secondary | ICD-10-CM | POA: Diagnosis not present

## 2020-02-19 DIAGNOSIS — I509 Heart failure, unspecified: Secondary | ICD-10-CM | POA: Diagnosis not present

## 2020-02-19 DIAGNOSIS — I70244 Atherosclerosis of native arteries of left leg with ulceration of heel and midfoot: Secondary | ICD-10-CM | POA: Diagnosis not present

## 2020-02-19 DIAGNOSIS — F0390 Unspecified dementia without behavioral disturbance: Secondary | ICD-10-CM | POA: Diagnosis not present

## 2020-02-19 DIAGNOSIS — R1312 Dysphagia, oropharyngeal phase: Secondary | ICD-10-CM | POA: Diagnosis not present

## 2020-02-19 DIAGNOSIS — M6281 Muscle weakness (generalized): Secondary | ICD-10-CM | POA: Diagnosis not present

## 2020-02-19 DIAGNOSIS — Z9181 History of falling: Secondary | ICD-10-CM | POA: Diagnosis not present

## 2020-02-19 DIAGNOSIS — Z4789 Encounter for other orthopedic aftercare: Secondary | ICD-10-CM | POA: Diagnosis not present

## 2020-02-19 DIAGNOSIS — R2689 Other abnormalities of gait and mobility: Secondary | ICD-10-CM | POA: Diagnosis not present

## 2020-02-21 DIAGNOSIS — G8929 Other chronic pain: Secondary | ICD-10-CM | POA: Diagnosis not present

## 2020-02-21 DIAGNOSIS — Z515 Encounter for palliative care: Secondary | ICD-10-CM | POA: Diagnosis not present

## 2020-02-21 DIAGNOSIS — I779 Disorder of arteries and arterioles, unspecified: Secondary | ICD-10-CM | POA: Diagnosis not present

## 2020-02-26 DIAGNOSIS — I70244 Atherosclerosis of native arteries of left leg with ulceration of heel and midfoot: Secondary | ICD-10-CM | POA: Diagnosis not present

## 2020-02-26 DIAGNOSIS — I5032 Chronic diastolic (congestive) heart failure: Secondary | ICD-10-CM | POA: Diagnosis not present

## 2020-02-26 DIAGNOSIS — I10 Essential (primary) hypertension: Secondary | ICD-10-CM | POA: Diagnosis not present

## 2020-02-26 DIAGNOSIS — S81801D Unspecified open wound, right lower leg, subsequent encounter: Secondary | ICD-10-CM | POA: Diagnosis not present

## 2020-02-26 DIAGNOSIS — I480 Paroxysmal atrial fibrillation: Secondary | ICD-10-CM | POA: Diagnosis not present

## 2020-02-26 DIAGNOSIS — F0390 Unspecified dementia without behavioral disturbance: Secondary | ICD-10-CM | POA: Diagnosis not present

## 2020-02-26 DIAGNOSIS — I70245 Atherosclerosis of native arteries of left leg with ulceration of other part of foot: Secondary | ICD-10-CM | POA: Diagnosis not present

## 2020-03-05 DIAGNOSIS — L89620 Pressure ulcer of left heel, unstageable: Secondary | ICD-10-CM | POA: Diagnosis not present

## 2020-03-05 DIAGNOSIS — L89890 Pressure ulcer of other site, unstageable: Secondary | ICD-10-CM | POA: Diagnosis not present

## 2020-03-05 DIAGNOSIS — S81801D Unspecified open wound, right lower leg, subsequent encounter: Secondary | ICD-10-CM | POA: Diagnosis not present

## 2020-03-07 DIAGNOSIS — K648 Other hemorrhoids: Secondary | ICD-10-CM | POA: Diagnosis not present

## 2020-03-07 DIAGNOSIS — G8929 Other chronic pain: Secondary | ICD-10-CM | POA: Diagnosis not present

## 2020-03-07 DIAGNOSIS — I10 Essential (primary) hypertension: Secondary | ICD-10-CM | POA: Diagnosis not present

## 2020-03-07 DIAGNOSIS — D649 Anemia, unspecified: Secondary | ICD-10-CM | POA: Diagnosis not present

## 2020-03-07 DIAGNOSIS — K625 Hemorrhage of anus and rectum: Secondary | ICD-10-CM | POA: Diagnosis not present

## 2020-03-10 DIAGNOSIS — I10 Essential (primary) hypertension: Secondary | ICD-10-CM | POA: Diagnosis not present

## 2020-03-10 DIAGNOSIS — K6289 Other specified diseases of anus and rectum: Secondary | ICD-10-CM | POA: Diagnosis not present

## 2020-03-10 DIAGNOSIS — K625 Hemorrhage of anus and rectum: Secondary | ICD-10-CM | POA: Diagnosis not present

## 2020-03-13 DIAGNOSIS — L89620 Pressure ulcer of left heel, unstageable: Secondary | ICD-10-CM | POA: Diagnosis not present

## 2020-03-13 DIAGNOSIS — E440 Moderate protein-calorie malnutrition: Secondary | ICD-10-CM | POA: Diagnosis not present

## 2020-03-18 DIAGNOSIS — U071 COVID-19: Secondary | ICD-10-CM | POA: Diagnosis not present

## 2020-03-18 DIAGNOSIS — Z9189 Other specified personal risk factors, not elsewhere classified: Secondary | ICD-10-CM | POA: Diagnosis not present

## 2020-03-18 DIAGNOSIS — F1120 Opioid dependence, uncomplicated: Secondary | ICD-10-CM | POA: Diagnosis not present

## 2020-03-18 DIAGNOSIS — G8929 Other chronic pain: Secondary | ICD-10-CM | POA: Diagnosis not present

## 2020-03-25 DIAGNOSIS — Z515 Encounter for palliative care: Secondary | ICD-10-CM | POA: Diagnosis not present

## 2020-03-25 DIAGNOSIS — I5032 Chronic diastolic (congestive) heart failure: Secondary | ICD-10-CM | POA: Diagnosis not present

## 2020-03-25 DIAGNOSIS — Z9189 Other specified personal risk factors, not elsewhere classified: Secondary | ICD-10-CM | POA: Diagnosis not present

## 2020-03-25 DIAGNOSIS — U071 COVID-19: Secondary | ICD-10-CM | POA: Diagnosis not present

## 2020-03-31 ENCOUNTER — Telehealth: Payer: Self-pay

## 2020-03-31 NOTE — Telephone Encounter (Signed)
Phone call received from PCP at [facility]. Patient tested positive for COVID last week. She is currently on 4 liters n/c. PCP, Earmon Phoenix, provided verbal order for hospice eval. Hospice Admissions made aware.

## 2020-03-31 NOTE — Telephone Encounter (Signed)
Phone call placed to patient's nephew, [name], to provide update that Palliative NP will now be Latoya NP. Also, discussed that verbal order for Hospice eval received and that Hospice Admissions will call to set up an appointment. Phil expressed appreciation.

## 2020-04-01 DIAGNOSIS — I480 Paroxysmal atrial fibrillation: Secondary | ICD-10-CM | POA: Diagnosis not present

## 2020-04-01 DIAGNOSIS — S81801D Unspecified open wound, right lower leg, subsequent encounter: Secondary | ICD-10-CM | POA: Diagnosis not present

## 2020-04-01 DIAGNOSIS — I10 Essential (primary) hypertension: Secondary | ICD-10-CM | POA: Diagnosis not present

## 2020-04-01 DIAGNOSIS — L89890 Pressure ulcer of other site, unstageable: Secondary | ICD-10-CM | POA: Diagnosis not present

## 2020-04-01 DIAGNOSIS — L89620 Pressure ulcer of left heel, unstageable: Secondary | ICD-10-CM | POA: Diagnosis not present

## 2020-04-01 DIAGNOSIS — I5032 Chronic diastolic (congestive) heart failure: Secondary | ICD-10-CM | POA: Diagnosis not present

## 2020-04-01 DIAGNOSIS — F0390 Unspecified dementia without behavioral disturbance: Secondary | ICD-10-CM | POA: Diagnosis not present

## 2020-04-11 DIAGNOSIS — F1120 Opioid dependence, uncomplicated: Secondary | ICD-10-CM | POA: Diagnosis not present

## 2020-04-11 DIAGNOSIS — J9601 Acute respiratory failure with hypoxia: Secondary | ICD-10-CM | POA: Diagnosis not present

## 2020-04-11 DIAGNOSIS — I7389 Other specified peripheral vascular diseases: Secondary | ICD-10-CM | POA: Diagnosis not present

## 2020-04-29 DEATH — deceased

## 2021-02-26 IMAGING — CR DG THORACIC SPINE 2V
3 series · 3 of 3 positions shown · non-contrast
Comparison: 07/21/2019

CLINICAL DATA: Un witnessed fall, found down

EXAM:
THORACIC SPINE 2 VIEWS

[w thoracic spine lat (1 of 2)]
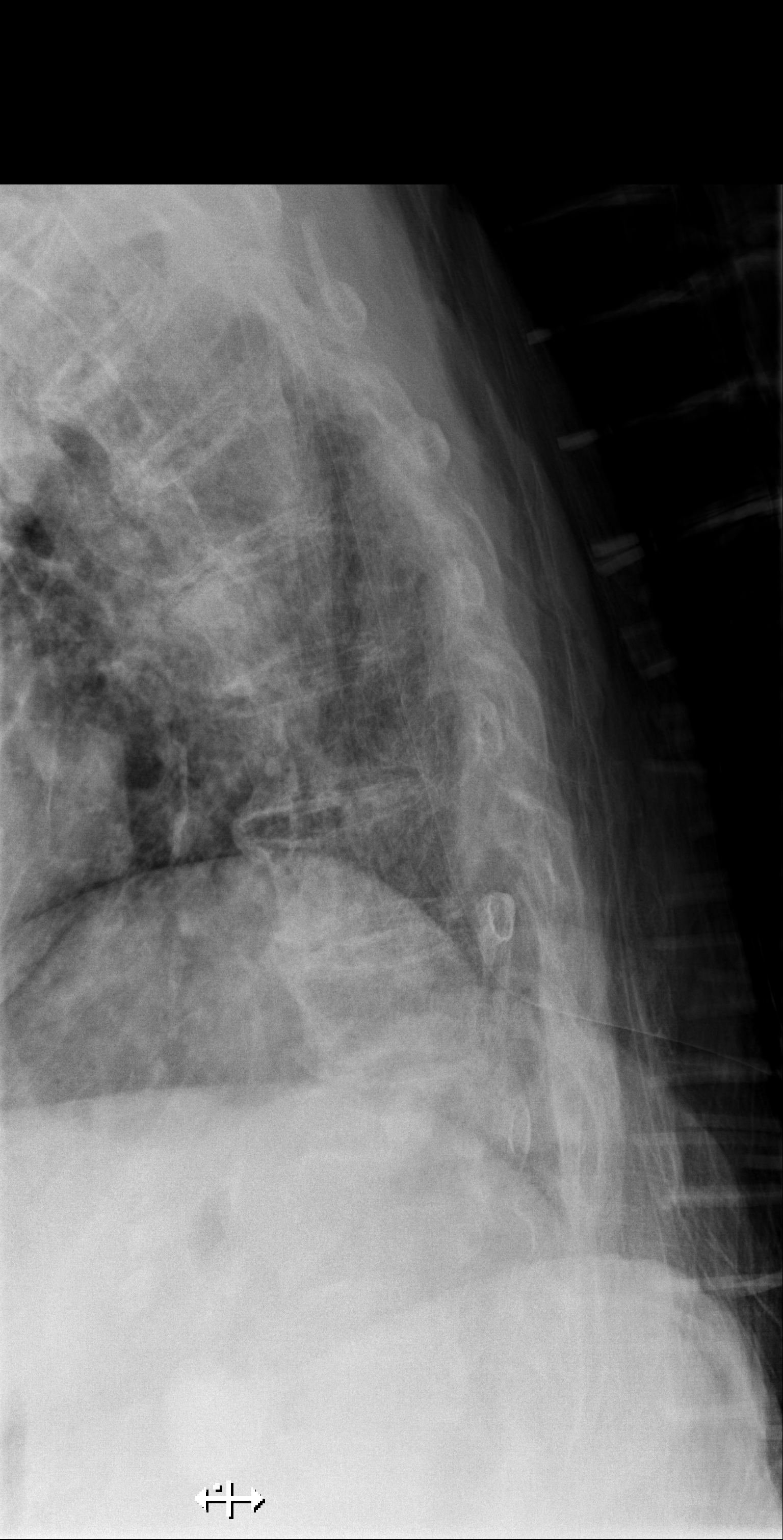

[w thoracic spine lat (2 of 2)]
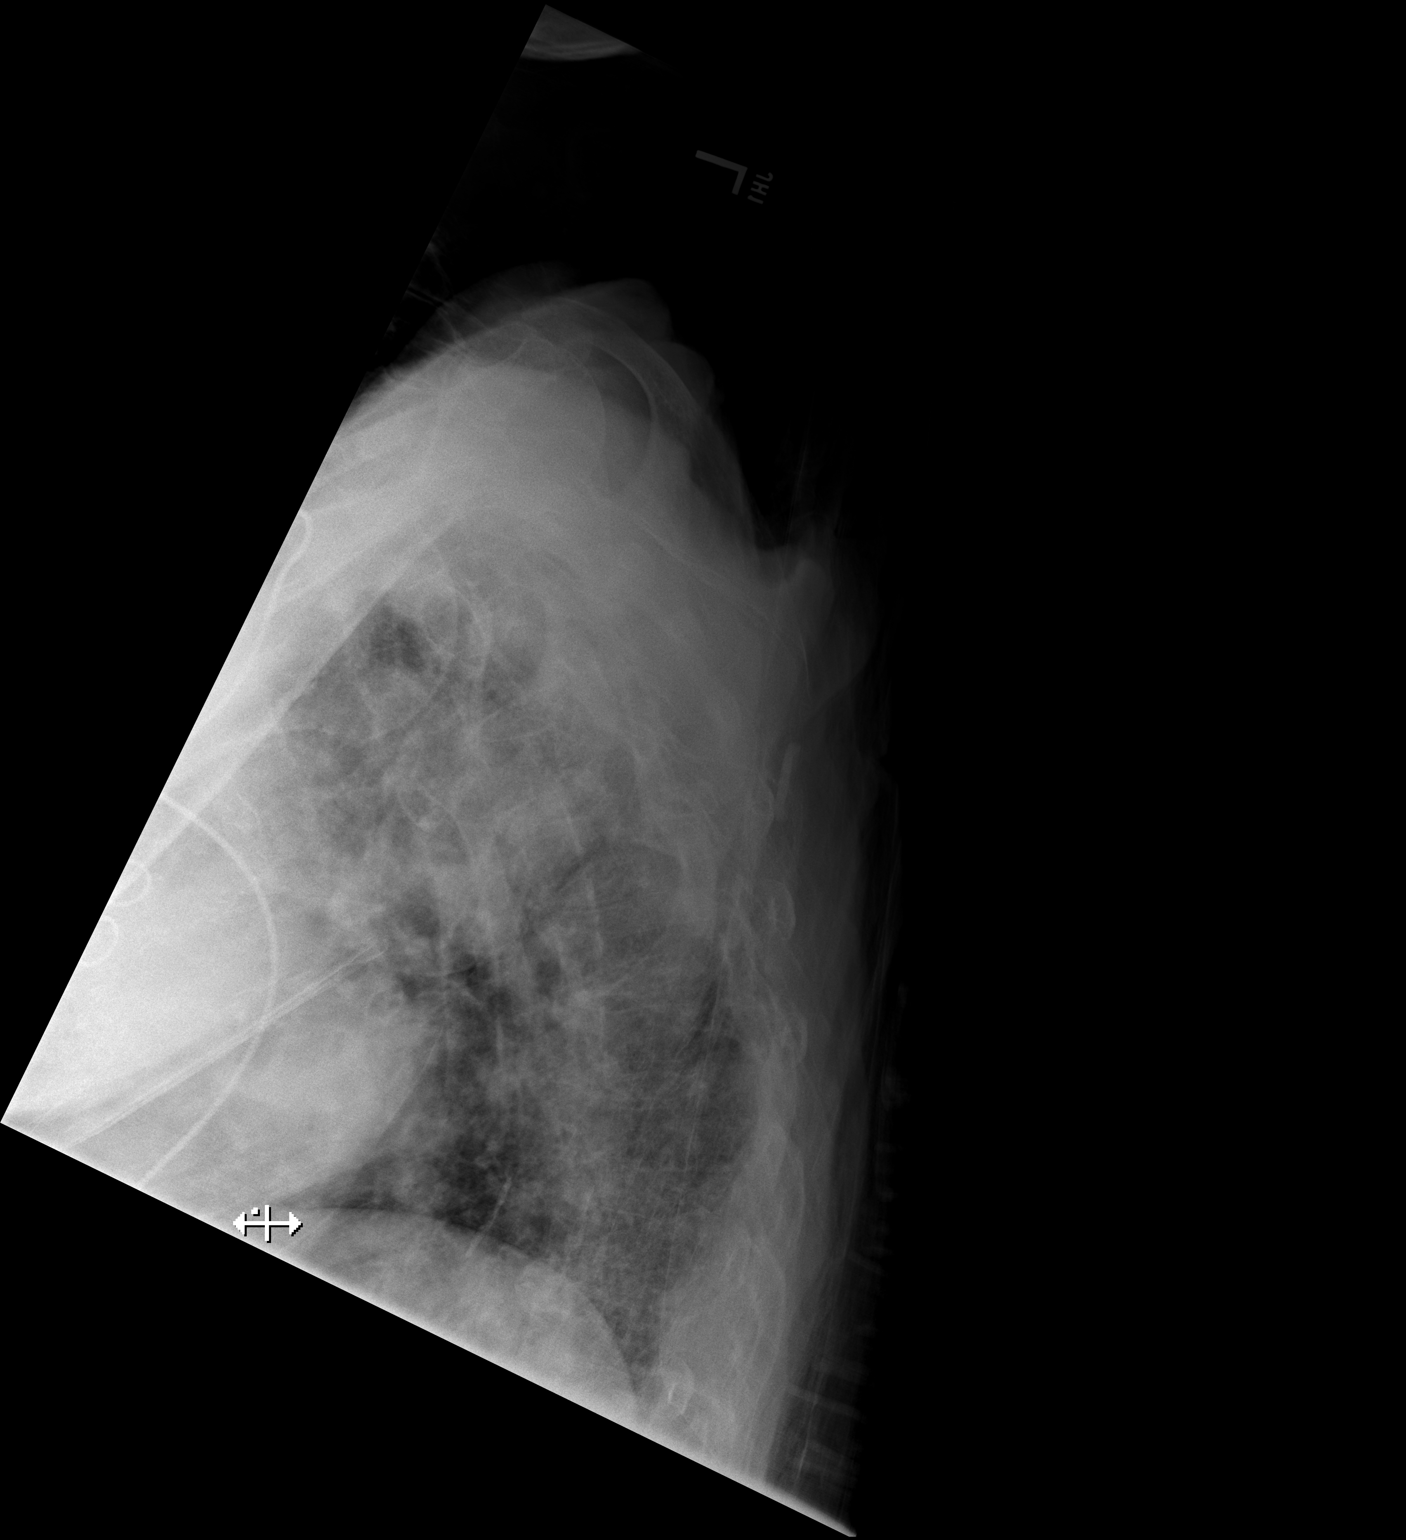

[x thoracic spine lat]
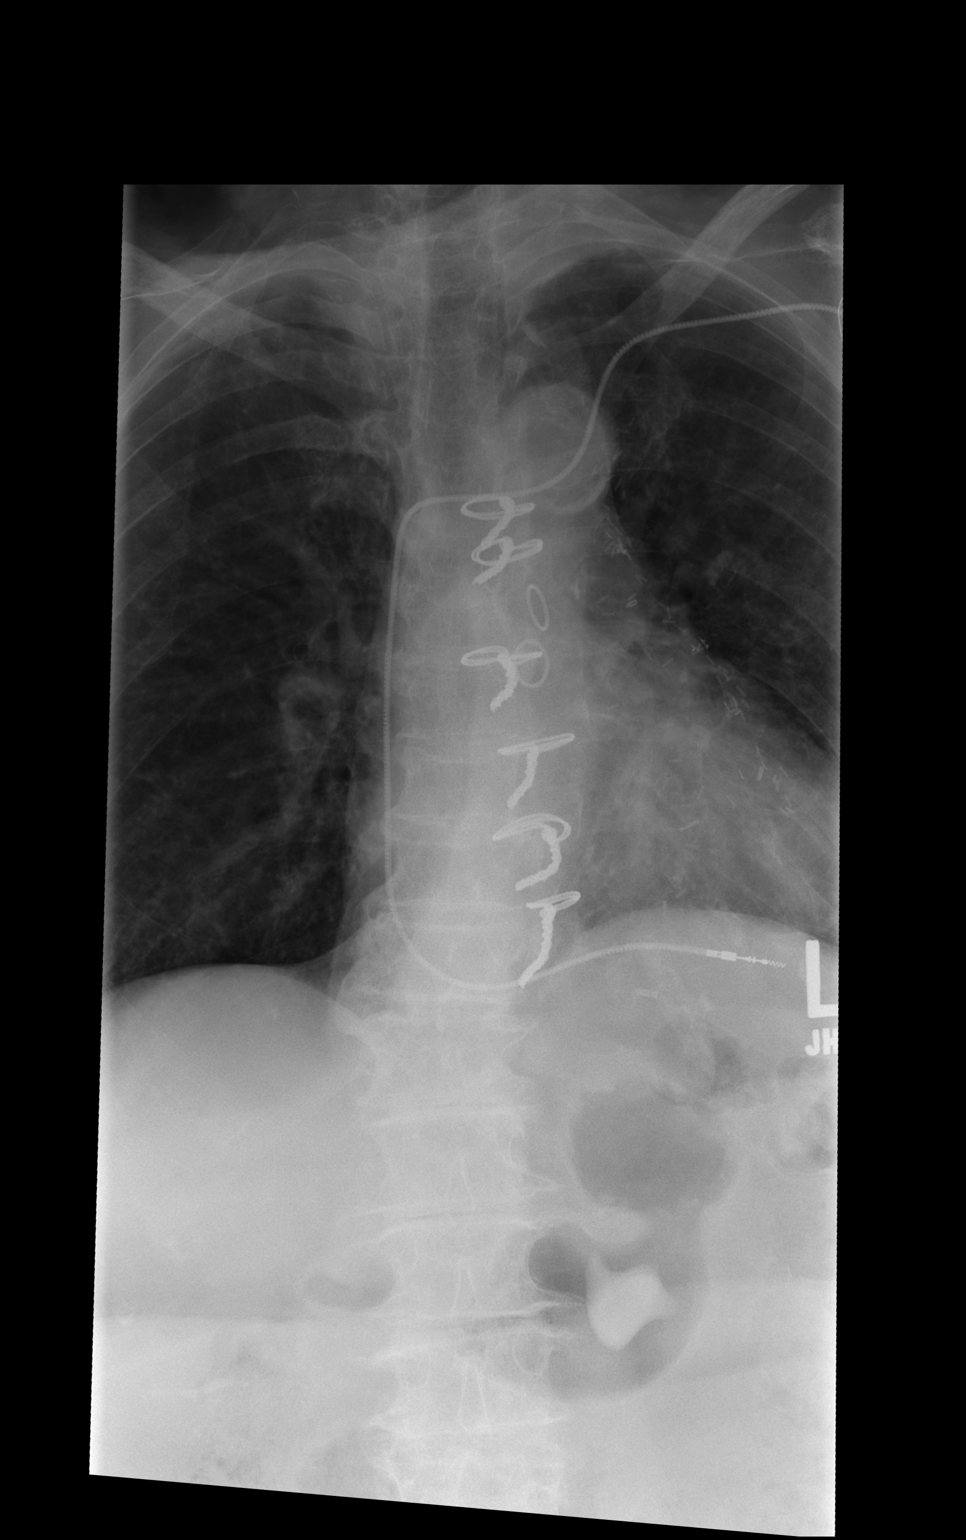

[3 of 3 positions shown; findings below may reference images not displayed]

FINDINGS: Frontal and cross-table lateral views of the thoracic spine are
obtained. Lateral views are suboptimal due to positioning and
overlying structures. Stable right convex curvature centered in the
lower thoracic spine. Otherwise alignment appears anatomic. There is
extensive multilevel thoracic spondylosis unchanged. No evidence of
acute fracture.
IMPRESSION: 1. No gross displaced fracture. Evaluation limited by technique and
positioning.

## 2021-02-27 IMAGING — CR DG PORTABLE PELVIS
1 series · 1 of 1 positions shown · non-contrast
Comparison: 07/26/2019

CLINICAL DATA: Postop

EXAM:
PORTABLE PELVIS 1-2 VIEWS

[AP]
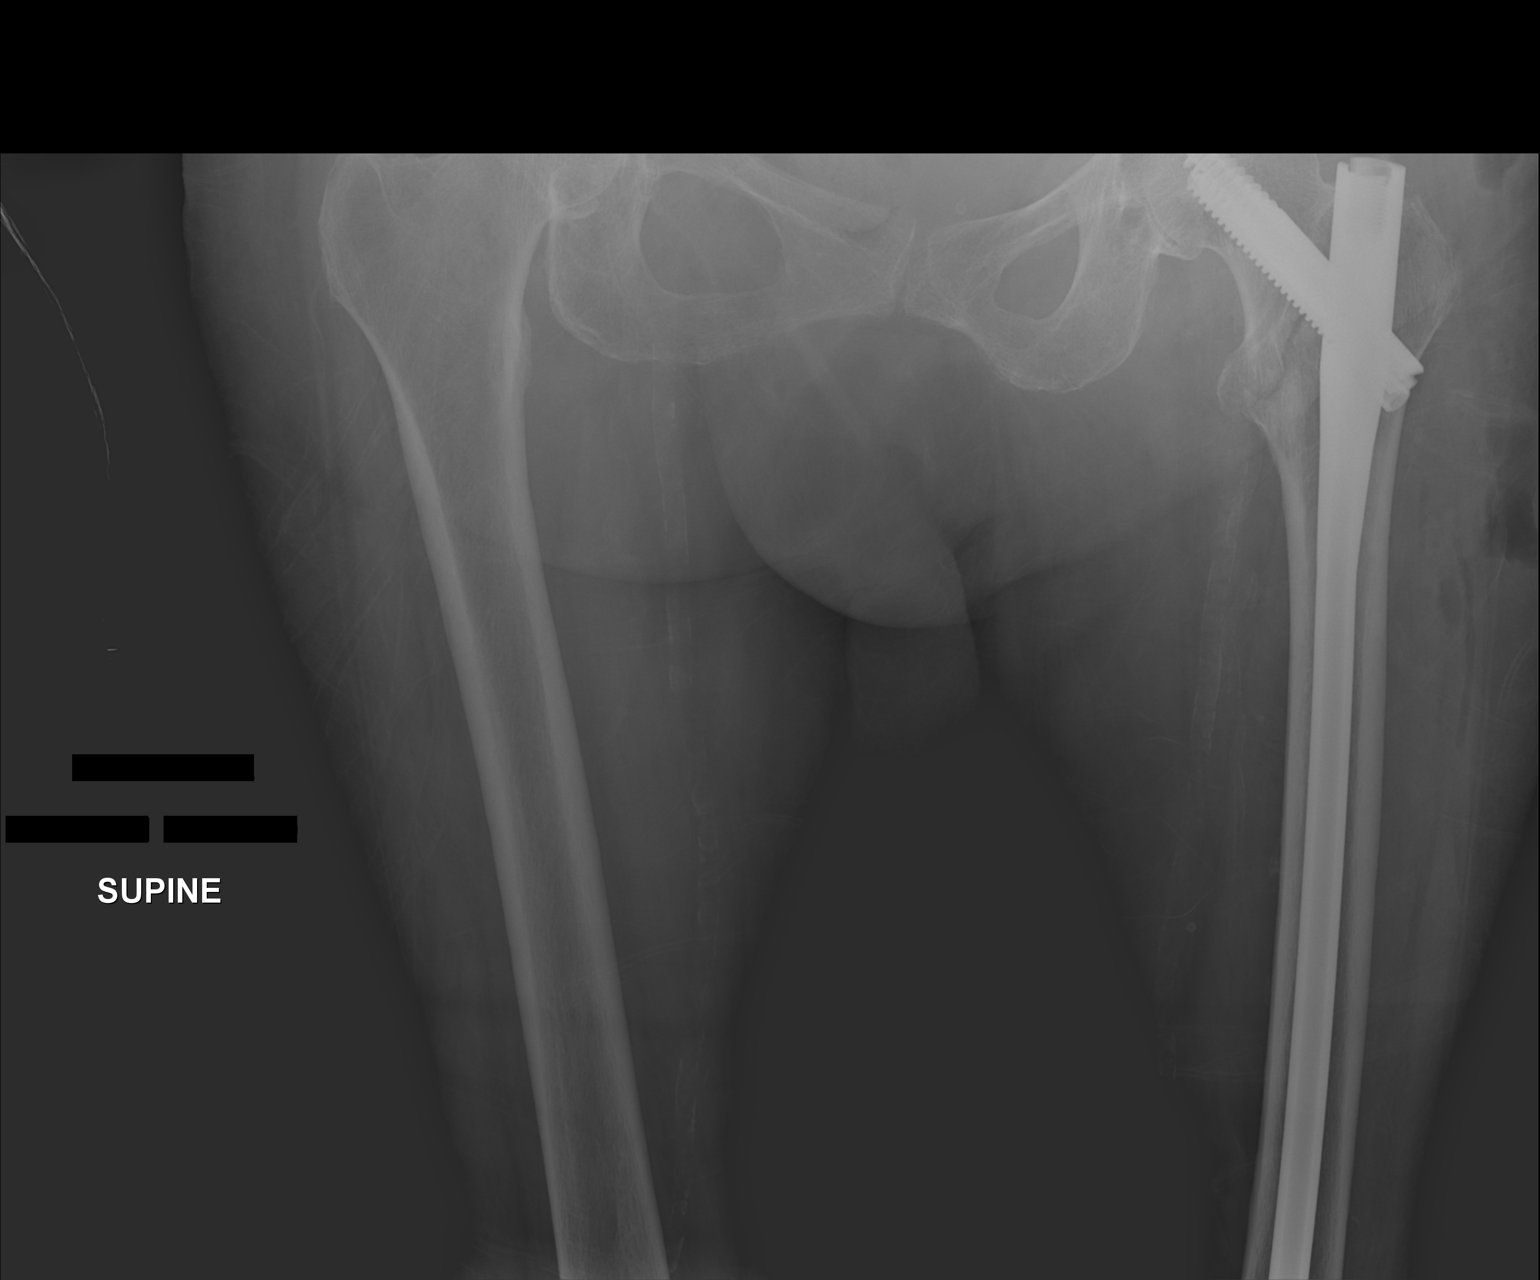

[1 of 1 positions shown; findings below may reference images not displayed]

FINDINGS: Incompletely visualized intramedullary rod within the left femur,
bridging a left intertrochanteric fracture with anatomic alignment
on single view. Hardware appears intact. Vascular calcifications.
IMPRESSION: Partially visualized intramedullary left femoral rod for
intertrochanteric fracture. Gas in the soft tissues consistent with
recent surgery.

## 2021-02-27 IMAGING — RF DG FEMUR 2+V*L*
1 series · 4 of 4 positions shown · IV contrast (agent unspecified)
Comparison: 07/26/2019

CLINICAL DATA: Left femur IM nail

EXAM:
DG C-ARM 1-60 MIN; LEFT FEMUR 2 VIEWS
CONTRAST:  None
FLUOROSCOPY TIME:  Fluoroscopy Time:  1 minutes 29 seconds
Number of Acquired Spot Images: 4

[Series 1: run · 4 of 4 slices shown]
[im 1/4]
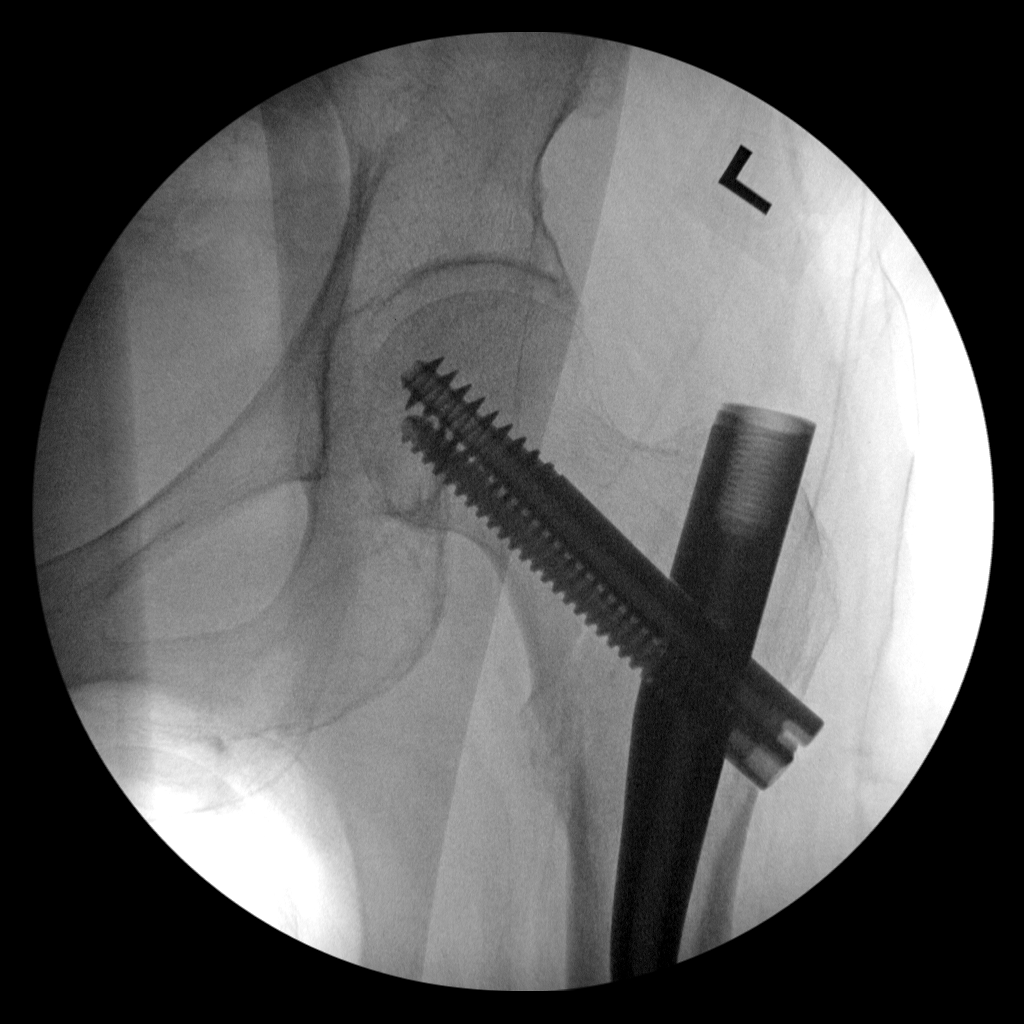
[im 2/4]
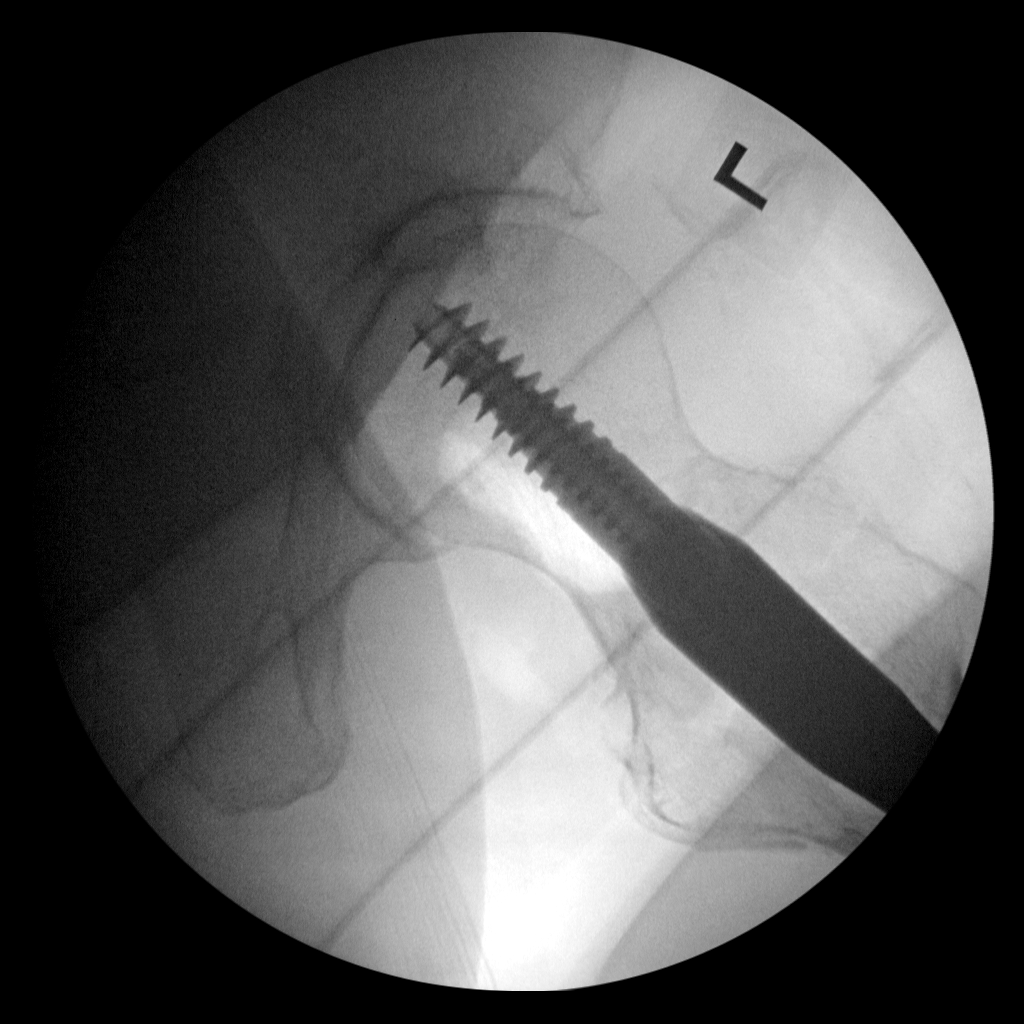
[im 3/4]
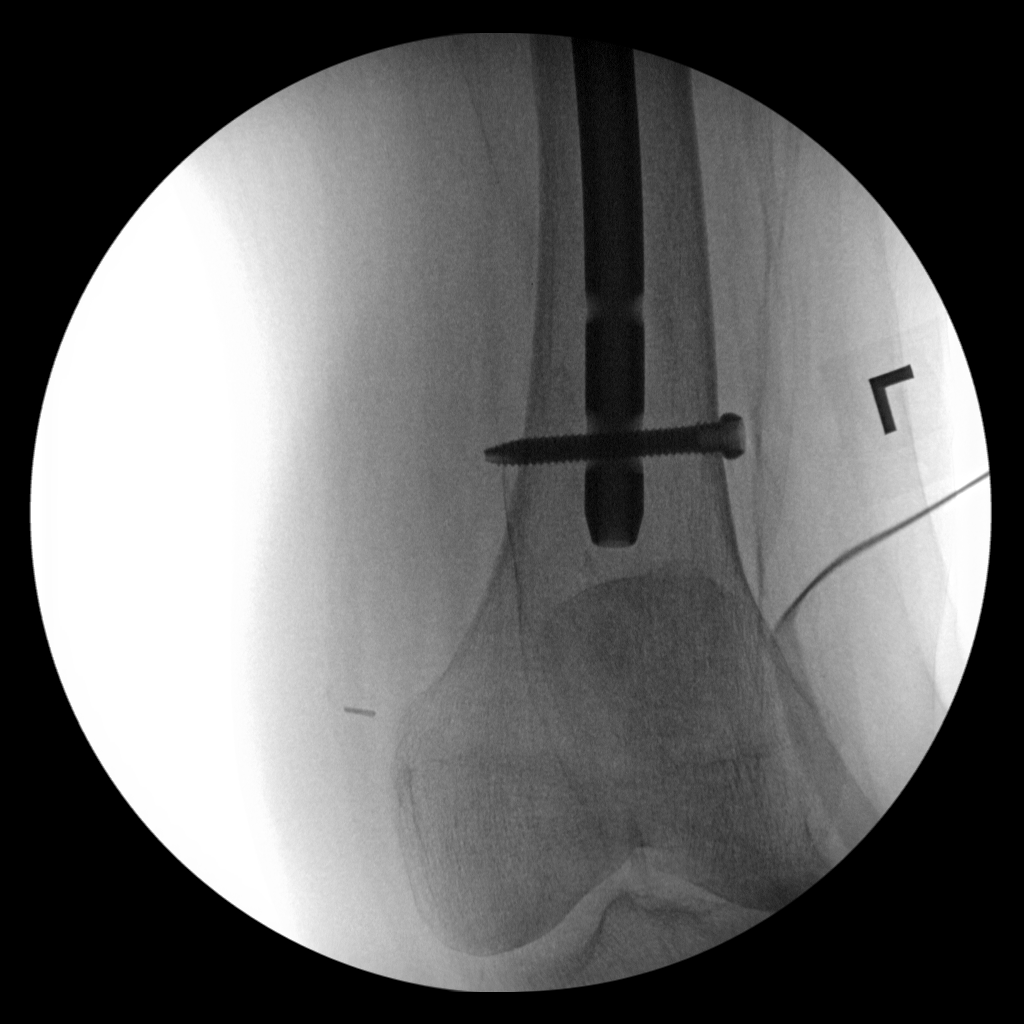
[im 4/4]
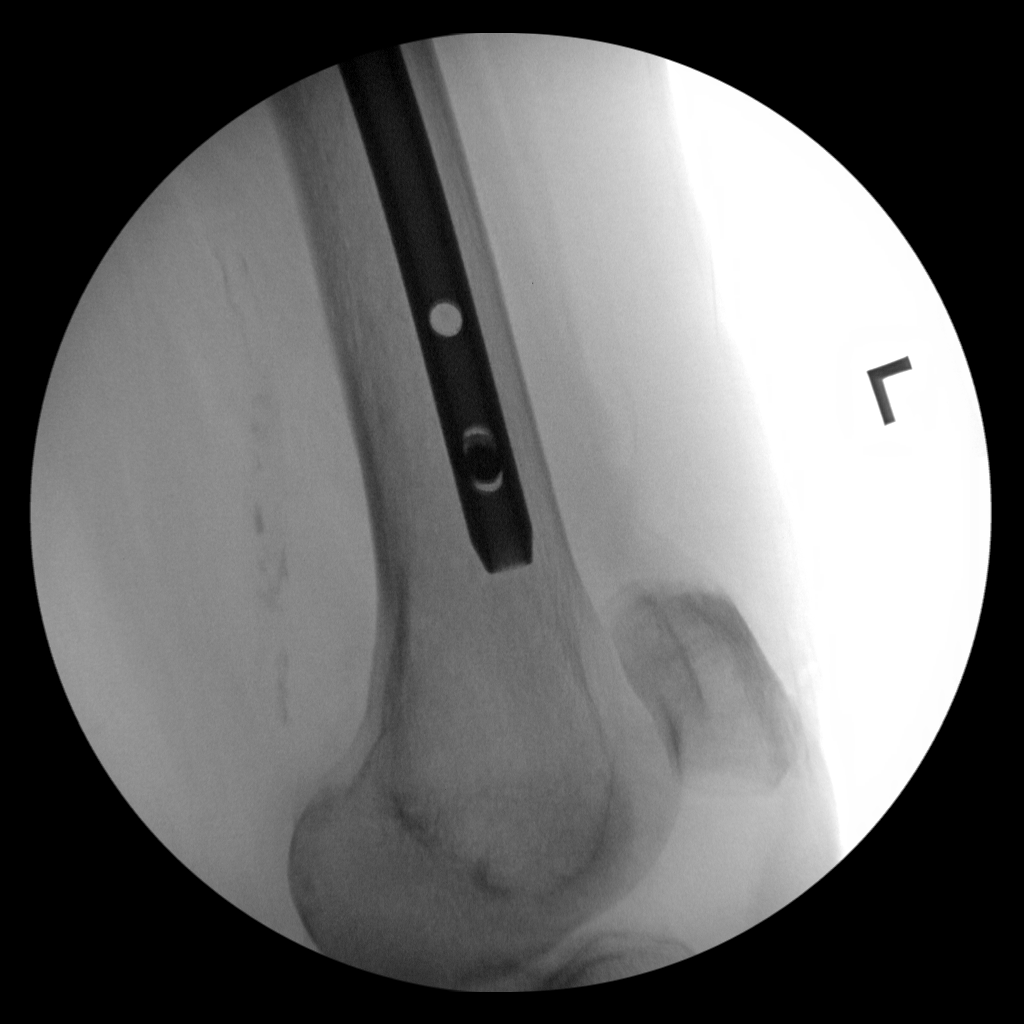

[4 of 4 positions shown; findings below may reference images not displayed]

FINDINGS: Four low resolution intraoperative spot views of the left femur. The
images demonstrate intramedullary rod and distal screw fixation of
the left femur for intertrochanteric fracture. There is anatomic
alignment.
IMPRESSION: Intraoperative fluoroscopic assistance provided during surgical
fixation of proximal left femoral fracture
